# Patient Record
Sex: Female | Born: 1955 | Race: Black or African American | Hispanic: No | Smoking: Never smoker
Health system: Southern US, Community
[De-identification: ages and names within clinical notes are randomized; demographics above are authoritative.]

## PROBLEM LIST (undated history)

## (undated) DIAGNOSIS — C50912 Malignant neoplasm of unspecified site of left female breast: Secondary | ICD-10-CM

## (undated) DIAGNOSIS — Z800 Family history of malignant neoplasm of digestive organs: Secondary | ICD-10-CM

## (undated) DIAGNOSIS — I201 Angina pectoris with documented spasm: Secondary | ICD-10-CM

## (undated) DIAGNOSIS — I208 Other forms of angina pectoris: Secondary | ICD-10-CM

## (undated) DIAGNOSIS — M1990 Unspecified osteoarthritis, unspecified site: Secondary | ICD-10-CM

## (undated) DIAGNOSIS — Z923 Personal history of irradiation: Secondary | ICD-10-CM

## (undated) DIAGNOSIS — I2089 Other forms of angina pectoris: Secondary | ICD-10-CM

## (undated) DIAGNOSIS — Z803 Family history of malignant neoplasm of breast: Secondary | ICD-10-CM

## (undated) DIAGNOSIS — I214 Non-ST elevation (NSTEMI) myocardial infarction: Secondary | ICD-10-CM

## (undated) DIAGNOSIS — I10 Essential (primary) hypertension: Secondary | ICD-10-CM

## (undated) DIAGNOSIS — J189 Pneumonia, unspecified organism: Secondary | ICD-10-CM

## (undated) DIAGNOSIS — Z9221 Personal history of antineoplastic chemotherapy: Secondary | ICD-10-CM

## (undated) HISTORY — DX: Family history of malignant neoplasm of breast: Z80.3

## (undated) HISTORY — PX: BUNIONECTOMY: SHX129

## (undated) HISTORY — PX: CARDIAC CATHETERIZATION: SHX172

## (undated) HISTORY — DX: Family history of malignant neoplasm of digestive organs: Z80.0

---

## 1979-01-03 HISTORY — PX: TUBAL LIGATION: SHX77

## 1989-01-02 HISTORY — PX: ECTOPIC PREGNANCY SURGERY: SHX613

## 1997-07-13 ENCOUNTER — Ambulatory Visit (HOSPITAL_COMMUNITY): Admission: RE | Admit: 1997-07-13 | Discharge: 1997-07-13 | Payer: Self-pay | Admitting: Family Medicine

## 2007-01-03 DIAGNOSIS — J189 Pneumonia, unspecified organism: Secondary | ICD-10-CM

## 2007-01-03 HISTORY — DX: Pneumonia, unspecified organism: J18.9

## 2011-03-31 ENCOUNTER — Ambulatory Visit (INDEPENDENT_AMBULATORY_CARE_PROVIDER_SITE_OTHER): Payer: Federal, State, Local not specified - PPO | Admitting: Family Medicine

## 2011-03-31 VITALS — BP 111/73 | HR 59 | Temp 98.0°F | Resp 18 | Ht 66.5 in | Wt 198.2 lb

## 2011-03-31 DIAGNOSIS — R112 Nausea with vomiting, unspecified: Secondary | ICD-10-CM

## 2011-03-31 DIAGNOSIS — R110 Nausea: Secondary | ICD-10-CM

## 2011-03-31 DIAGNOSIS — J40 Bronchitis, not specified as acute or chronic: Secondary | ICD-10-CM

## 2011-03-31 DIAGNOSIS — J209 Acute bronchitis, unspecified: Secondary | ICD-10-CM

## 2011-03-31 DIAGNOSIS — R1110 Vomiting, unspecified: Secondary | ICD-10-CM

## 2011-03-31 MED ORDER — ONDANSETRON HCL 8 MG PO TABS: 8.0000 mg | ORAL_TABLET | Freq: Three times a day (TID) | ORAL | Status: AC | PRN

## 2011-03-31 NOTE — Progress Notes (Signed)
  Patient Name: Mary Randall Date of Birth: 1955-04-07 Medical Record Number: [id number] Gender: female Date of Encounter: 03/31/2011  History of Present Illness:  Mary Randall is a 56 y.o. very pleasant female patient who presents with the following:  Diagnosed with bronchitis 2 days ago- started on a zpack and tessalon perles.  Went back to work today but she did not feel very well.  She felt nauseated and threw up.  Told that she had to come to the doctor- this is why she is here today.  She also has a cough, had a fever but felt a lot better today from these symptoms.  Stomach feels pretty ok right now. Did have diarrhea yesterday and this am . .. A few loose stools only.    There is no problem list on file for this patient.  No past medical history on file. No past surgical history on file. History  Substance Use Topics  . Smoking status: Never Smoker   . Smokeless tobacco: Never Used  . Alcohol Use: No   No family history on file. Allergies  Allergen Reactions  . Codeine     Dizzy,vomiting    Medication list has been reviewed and updated.  Review of Systems: As per HPI- otherwise negative.   Physical Examination: Filed Vitals:   03/31/11 1550  BP: 111/73  Pulse: 59  Temp: 98 F (36.7 C)  TempSrc: Oral  Resp: 18  Height: 5' 6.5" (1.689 m)  Weight: 198 lb 3.2 oz (89.903 kg)    Body mass index is 31.51 kg/(m^2).  GEN: WDWN, NAD, Non-toxic, A & O x 3 HEENT: Atraumatic, Normocephalic. Neck supple. No masses, No LAD.  TM, oropharynx wnl Ears and Nose: No external deformity. CV: RRR, No M/G/R. No JVD. No thrill. No extra heart sounds. PULM: CTA B, no wheezes, crackles, rhonchi. No retractions. No resp. distress. No accessory muscle use. ABD: S, NT, ND, +BS. No rebound. No HSM. EXTR: No c/c/e NEURO Normal gait.  PSYCH: Normally interactive. Conversant. Not depressed or anxious appearing.  Calm demeanor.    Assessment and Plan: 1. Vomiting  ondansetron (ZOFRAN)  8 MG tablet  2. Nausea    3. Bronchitis     Likely recovering from bronchitis- here today mainly to get a doctor's note for work.  Also gave a few zofran for prn use for nausea as above.  Patient (or parent if minor) instructed to return to clinic or call if not better in 2-3 day(s).

## 2011-12-04 DIAGNOSIS — M1710 Unilateral primary osteoarthritis, unspecified knee: Secondary | ICD-10-CM | POA: Insufficient documentation

## 2012-02-21 ENCOUNTER — Other Ambulatory Visit: Payer: Self-pay | Admitting: Family Medicine

## 2012-02-21 NOTE — Telephone Encounter (Signed)
Please pull paper chart.  

## 2012-02-22 NOTE — Telephone Encounter (Signed)
Chart pulled to PA pool DOS 03/24/11

## 2012-02-25 ENCOUNTER — Other Ambulatory Visit: Payer: Self-pay | Admitting: Family Medicine

## 2012-02-26 NOTE — Telephone Encounter (Signed)
Please pull paper chart.  

## 2012-02-27 NOTE — Telephone Encounter (Signed)
Chart pulled at pa pool DOS: 03/24/11

## 2012-03-05 DIAGNOSIS — I201 Angina pectoris with documented spasm: Secondary | ICD-10-CM | POA: Insufficient documentation

## 2012-09-04 ENCOUNTER — Emergency Department (HOSPITAL_COMMUNITY): Payer: Federal, State, Local not specified - PPO

## 2012-09-04 ENCOUNTER — Observation Stay (HOSPITAL_COMMUNITY)
Admission: EM | Admit: 2012-09-04 | Discharge: 2012-09-05 | Disposition: A | Discharge status: Home / Self Care | Payer: Federal, State, Local not specified - PPO | Attending: Internal Medicine | Admitting: Internal Medicine

## 2012-09-04 ENCOUNTER — Encounter (HOSPITAL_COMMUNITY): Payer: Self-pay | Admitting: Emergency Medicine

## 2012-09-04 DIAGNOSIS — I10 Essential (primary) hypertension: Secondary | ICD-10-CM | POA: Diagnosis present

## 2012-09-04 DIAGNOSIS — Z79899 Other long term (current) drug therapy: Secondary | ICD-10-CM | POA: Insufficient documentation

## 2012-09-04 DIAGNOSIS — I498 Other specified cardiac arrhythmias: Secondary | ICD-10-CM | POA: Insufficient documentation

## 2012-09-04 DIAGNOSIS — Z7982 Long term (current) use of aspirin: Secondary | ICD-10-CM | POA: Insufficient documentation

## 2012-09-04 DIAGNOSIS — R079 Chest pain, unspecified: Principal | ICD-10-CM | POA: Diagnosis present

## 2012-09-04 HISTORY — DX: Other forms of angina pectoris: I20.89

## 2012-09-04 HISTORY — DX: Essential (primary) hypertension: I10

## 2012-09-04 HISTORY — DX: Other forms of angina pectoris: I20.8

## 2012-09-04 LAB — CBC WITH DIFFERENTIAL/PLATELET
Basophils Absolute: 0 10*3/uL (ref 0.0–0.1)
Basophils Absolute: 0 10*3/uL (ref 0.0–0.1)
Basophils Relative: 1 % (ref 0–1)
Eosinophils Relative: 1 % (ref 0–5)
HCT: 39.5 % (ref 36.0–46.0)
Lymphocytes Relative: 47 % — ABNORMAL HIGH (ref 12–46)
Lymphocytes Relative: 41 % (ref 12–46)
Lymphs Abs: 1.9 10*3/uL (ref 0.7–4.0)
MCHC: 33.8 g/dL (ref 30.0–36.0)
MCV: 84.8 fL (ref 78.0–100.0)
Monocytes Absolute: 0.2 10*3/uL (ref 0.1–1.0)
Neutro Abs: 1.8 10*3/uL (ref 1.7–7.7)
Neutro Abs: 2.1 10*3/uL (ref 1.7–7.7)
Platelets: 201 10*3/uL (ref 150–400)
RBC: 4.66 MIL/uL (ref 3.87–5.11)
RDW: 13.2 % (ref 11.5–15.5)
WBC: 4 10*3/uL (ref 4.0–10.5)
WBC: 4.2 10*3/uL (ref 4.0–10.5)

## 2012-09-04 LAB — TSH: TSH: 1.184 u[IU]/mL (ref 0.350–4.500)

## 2012-09-04 LAB — BASIC METABOLIC PANEL
CO2: 28 mEq/L (ref 19–32)
Calcium: 10.7 mg/dL — ABNORMAL HIGH (ref 8.4–10.5)
Chloride: 102 mEq/L (ref 96–112)
Creatinine, Ser: 0.86 mg/dL (ref 0.50–1.10)
Glucose, Bld: 99 mg/dL (ref 70–99)
Sodium: 138 mEq/L (ref 135–145)

## 2012-09-04 LAB — TROPONIN I
Troponin I: <0.3 ng/mL (ref <0.30)
Troponin I: <0.3 ng/mL (ref <0.30)
Troponin I: <0.3 ng/mL (ref <0.30)

## 2012-09-04 LAB — HEMOGLOBIN A1C
Hgb A1c MFr Bld: 5.9 % — ABNORMAL HIGH (ref <5.7)
Mean Plasma Glucose: 123 mg/dL — ABNORMAL HIGH (ref <117)

## 2012-09-04 LAB — CREATININE, SERUM
GFR calc Af Amer: 84 mL/min — ABNORMAL LOW (ref >90)
GFR calc non Af Amer: 72 mL/min — ABNORMAL LOW (ref >90)

## 2012-09-04 MED ORDER — METOPROLOL SUCCINATE ER 50 MG PO TB24
50.0000 mg | ORAL_TABLET | Freq: Two times a day (BID) | ORAL | Status: DC
  Administered 2012-09-04: 50 mg via ORAL
  Filled 2012-09-04 (×3): qty 1

## 2012-09-04 MED ORDER — ASPIRIN EC 325 MG PO TBEC: 325.0000 mg | DELAYED_RELEASE_TABLET | Freq: Every day | ORAL | Status: DC

## 2012-09-04 MED ORDER — ACETAMINOPHEN 325 MG PO TABS
650.0000 mg | ORAL_TABLET | ORAL | Status: DC | PRN
  Filled 2012-09-04: qty 2

## 2012-09-04 MED ORDER — ENOXAPARIN SODIUM 40 MG/0.4ML ~~LOC~~ SOLN
40.0000 mg | SUBCUTANEOUS | Status: DC
  Administered 2012-09-04: 40 mg via SUBCUTANEOUS
  Filled 2012-09-04 (×2): qty 0.4

## 2012-09-04 MED ORDER — ISOSORBIDE MONONITRATE ER 30 MG PO TB24
30.0000 mg | ORAL_TABLET | Freq: Every day | ORAL | Status: DC
  Filled 2012-09-04: qty 1

## 2012-09-04 MED ORDER — NITROGLYCERIN 0.4 MG SL SUBL: 0.4000 mg | SUBLINGUAL_TABLET | SUBLINGUAL | Status: DC | PRN

## 2012-09-04 MED ORDER — NABUMETONE 750 MG PO TABS: 750.0000 mg | ORAL_TABLET | Freq: Two times a day (BID) | ORAL | Status: DC | PRN

## 2012-09-04 MED ORDER — IRBESARTAN 300 MG PO TABS
300.0000 mg | ORAL_TABLET | Freq: Every day | ORAL | Status: DC
  Administered 2012-09-05: 300 mg via ORAL
  Filled 2012-09-04: qty 1

## 2012-09-04 MED ORDER — ONDANSETRON HCL 4 MG/2ML IJ SOLN
4.0000 mg | Freq: Four times a day (QID) | INTRAMUSCULAR | Status: DC | PRN
  Filled 2012-09-04: qty 2

## 2012-09-04 MED ORDER — HYDROCHLOROTHIAZIDE 25 MG PO TABS
12.5000 mg | ORAL_TABLET | Freq: Every day | ORAL | Status: DC
  Administered 2012-09-04: 12.5 mg via ORAL
  Filled 2012-09-04 (×2): qty 0.5

## 2012-09-04 MED ORDER — AMLODIPINE BESYLATE 10 MG PO TABS
10.0000 mg | ORAL_TABLET | Freq: Every day | ORAL | Status: DC
  Administered 2012-09-05: 10 mg via ORAL
  Filled 2012-09-04: qty 1

## 2012-09-04 MED ORDER — HYDRALAZINE HCL 20 MG/ML IJ SOLN: 5.0000 mg | Freq: Once | INTRAMUSCULAR | Status: DC

## 2012-09-04 MED ORDER — HYDRALAZINE HCL 20 MG/ML IJ SOLN
INTRAMUSCULAR | Status: AC
  Filled 2012-09-04: qty 1

## 2012-09-04 MED ORDER — ASPIRIN 81 MG PO CHEW: 324.0000 mg | CHEWABLE_TABLET | ORAL | Status: DC

## 2012-09-04 MED ORDER — HYDRALAZINE HCL 20 MG/ML IJ SOLN: 5.0000 mg | INTRAMUSCULAR | Status: DC | PRN

## 2012-09-04 MED ORDER — ASPIRIN EC 81 MG PO TBEC
81.0000 mg | DELAYED_RELEASE_TABLET | Freq: Every day | ORAL | Status: DC
  Administered 2012-09-05: 81 mg via ORAL
  Filled 2012-09-04: qty 1

## 2012-09-04 MED ORDER — POTASSIUM CHLORIDE 20 MEQ PO PACK
20.0000 meq | PACK | Freq: Every day | ORAL | Status: DC
  Filled 2012-09-04: qty 1

## 2012-09-04 MED ORDER — ASPIRIN 300 MG RE SUPP: 300.0000 mg | RECTAL | Status: DC

## 2012-09-04 NOTE — H&P (Signed)
[facility] History and Physical  Mary Randall HYQ:[id number] DOB: [birth year] DOA: 09/04/2012  Referring physician:  PCP: Pcp Not In System  Specialists:   Chief Complaint: Chest Pain  HPI: Mary Randall is a 57 y.o. BF PMHx HTN, Presented to the ED with PMH of angina and hypertension complaining of substernal chest pain that started at 8am this morning and last till 9:20 am. Negative radiation, negative nausea vomiting, negative diaphoresis rated at 3/10.  She describes it as a squeezing and a pressure. She had a cardiac cath in 2010 done in [place] and was told she has cardiac spasms and some mild blockage not requiring stents or CABG. She has had 324 mg PO aspirin today given at the [facility] who sent her to the ED. She does not have a cardiologist here in [place] but has a family practice at [facility]. EKG shows NO STEMI in triage. She has not taken her BP medication in 4 days for no particular reason and denies being a smoker.   Review of Systems: The patient denies anorexia, fever, weight loss,, vision loss, decreased hearing, hoarseness, syncope, dyspnea on exertion, peripheral edema, balance deficits, hemoptysis, abdominal pain, melena, hematochezia, severe indigestion/heartburn, hematuria, incontinence, genital sores, muscle weakness, suspicious skin lesions, transient blindness, difficulty walking, depression, unusual weight change, abnormal bleeding, enlarged lymph nodes, angioedema, and breast masses.    Past Medical History  Diagnosis Date  . Hypertension   . Angina effort    No past surgical history on file. Social History:  reports that she has never smoked. She has never used smokeless tobacco. She reports that she does not drink alcohol or use illicit drugs.   Allergies  Allergen Reactions  . Codeine     Dizzy,vomiting    No family history on file.   Prior to Admission medications   Medication Sig Start Date End Date Taking? Authorizing  Provider  amLODipine (NORVASC) 10 MG tablet Take 10 mg by mouth daily.   Yes Historical Provider, MD  aspirin EC 325 MG tablet Take 325 mg by mouth daily.   Yes Historical Provider, MD  hydrochlorothiazide (HYDRODIURIL) 12.5 MG tablet Take 12.5 mg by mouth daily.   Yes Historical Provider, MD  irbesartan (AVAPRO) 300 MG tablet Take 300 mg by mouth daily.    Yes Historical Provider, MD  isosorbide mononitrate (IMDUR) 30 MG 24 hr tablet Take 30 mg by mouth daily.   Yes Historical Provider, MD  metoprolol succinate (TOPROL-XL) 50 MG 24 hr tablet Take 50 mg by mouth 2 (two) times daily. Take with or immediately following a meal.   Yes Historical Provider, MD  nabumetone (RELAFEN) 750 MG tablet Take 750 mg by mouth 2 (two) times daily as needed (for arthritis pain).   Yes Historical Provider, MD  potassium chloride (KLOR-CON) 20 MEQ packet Take 20 mEq by mouth daily.    Yes Historical Provider, MD   Physical Exam: Filed Vitals:   09/04/12 1117 09/04/12 1126 09/04/12 1130 09/04/12 1145  BP: 159/107  159/119 155/107  Pulse: 72  66 63  Temp: 98.6 F (37 C)     TempSrc: Oral     Resp: 14  20 22   SpO2:  100% 100% 100%     General: Alert NAD  Eyes:Pupils equal round react to light and accommodation  Neck: Negative JVD  Cardiovascular: Regular with a rate, negative murmurs rubs or gallops, DP/PT pulse 2+ bilateral  Respiratory: Clear to auscultation bilateral  Abdomen: Soft, nontender,  nondistended plus bowel sounds  Neurologic: Pupils equal reactive to light and accommodation cranial nerves II through XII intact, sensation intact throughout  Labs on Admission:  Basic Metabolic Panel:  Recent Labs Lab 09/04/12 1126  NA 138  K 3.8  CL 102  CO2 28  GLUCOSE 99  BUN 13  CREATININE 0.86  CALCIUM 10.7*   Liver Function Tests: No results found for this basename: AST, ALT, ALKPHOS, BILITOT, PROT, ALBUMIN,  in the last 168 hours No results found for this basename: LIPASE, AMYLASE,   in the last 168 hours No results found for this basename: AMMONIA,  in the last 168 hours CBC:  Recent Labs Lab 09/04/12 1126  WBC 4.0  NEUTROABS 1.8  HGB 13.6  HCT 39.5  MCV 84.8  PLT 195   Cardiac Enzymes:  Recent Labs Lab 09/04/12 1126  TROPONINI <0.30    BNP (last 3 results) No results found for this basename: PROBNP,  in the last 8760 hours CBG: No results found for this basename: GLUCAP,  in the last 168 hours  Radiological Exams on Admission: Dg Chest 2 View  09/04/2012   *RADIOLOGY REPORT*  Clinical Data: Chest pain  CHEST - 2 VIEW  Comparison: None.  Findings: Lungs clear.  Heart size and pulmonary vascularity are normal.  No adenopathy. No pneumothorax.  No bone lesions.  IMPRESSION: No abnormality noted.   Original Report Authenticated By: Bretta Bang, M.D.    EKG: Normal sinus rhythm, nonspecific ST-T wave changes in 1 aVL V1- V6 cannot rule out ischemia (no previous EKG for comparison)   Assessment/Plan Active Problems:   * No active hospital problems. *   1. Chest pain; patient asymptomatic currently, troponin x2 negative --Patient placed back on home BP medication, and when necessary hydralazine written with parameters --DC home in the a.m. unless third troponin positive  2. HTN; DC patient home in a.m. on her home medication counseled patient and family on importance of taking medication correctly --Will obtain cholesterol panel --Will obtain hemoglobin A1c   Code Status: Full Family Communication: Family present for discussion of plan of care Disposition Plan:    Time spent: 60 minute  Drema [place] [facility] Pager [phone]  If 7PM-7AM, please contact night-coverage [email or web address] Password [email or web address] 09/04/2012, 1:19 PM

## 2012-09-04 NOTE — ED Notes (Signed)
midsternal cp that started this am has not taken any bp meds x 4 days claims lazziness denies  N/v/sob dx w/ cardiac spasms in past came from fp office was given 324 asa  But no nitro no pain at this time bp was 170/130 refused  IV by em[facility]

## 2012-09-04 NOTE — ED Provider Notes (Signed)
CSN: [id number]     Arrival date & time 09/04/12  1102 History   First MD Initiated Contact with Patient 09/04/12 1107     Chief Complaint  Patient presents with  . Chest Pain   (Consider location/radiation/quality/duration/timing/severity/associated sxs/prior Treatment) HPI   Patient presents to the ED with PMH of angina and hypertension complaining of substernal chest pain that started at 8am this morning and last till 9:20 am. She describes it as a squeezing and a  pressure without radiation. She had a cardiac cath in 2010 done in [place] and was told she has cardiac spasms and some mild blockage not requiring stents or CABG.  She has had 324 mg PO aspirin today given at the Urgent Care who sent her to the ED. She does not have a cardiologist here in  but has a family practice at [facility]. EKG shows NO STEMI in triage. She has not taken her BP medication in 4 days for no particular reason and denies being a smoker.  Chest Pain  Pain location: substernal Pain quality:  Squeezing pressure Radiates to: none Pain severity: currently no pain. Was 6/10 pain Onset quality: immediate Timing: 8am-9:20am Progression: consistent Relieved by:  aspirin Associated symptoms:none.    Past Medical History  Diagnosis Date  . Hypertension   . Angina effort    No past surgical history on file. No family history on file. History  Substance Use Topics  . Smoking status: Never Smoker   . Smokeless tobacco: Never Used  . Alcohol Use: No   OB History   Grav Para Term Preterm Abortions TAB SAB Ect Mult Living                 Review of Systems ROS is negative unless otherwise stated in the HPI  Allergies  Codeine  Home Medications   Current Outpatient Rx  Name  Route  Sig  Dispense  Refill  . amLODipine (NORVASC) 10 MG tablet   Oral   Take 10 mg by mouth daily.         Marland Kitchen aspirin EC 325 MG tablet   Oral   Take 325 mg by mouth daily.         . hydrochlorothiazide  (HYDRODIURIL) 12.5 MG tablet   Oral   Take 12.5 mg by mouth daily.         . irbesartan (AVAPRO) 300 MG tablet   Oral   Take 300 mg by mouth daily.          . isosorbide mononitrate (IMDUR) 30 MG 24 hr tablet   Oral   Take 30 mg by mouth daily.         . metoprolol succinate (TOPROL-XL) 50 MG 24 hr tablet   Oral   Take 50 mg by mouth 2 (two) times daily. Take with or immediately following a meal.         . nabumetone (RELAFEN) 750 MG tablet   Oral   Take 750 mg by mouth 2 (two) times daily as needed (for arthritis pain).         . potassium chloride (KLOR-CON) 20 MEQ packet   Oral   Take 20 mEq by mouth daily.           BP 155/107  Pulse 63  Temp(Src) 98.6 F (37 C) (Oral)  Resp 22  SpO2 100% Physical Exam  Nursing note and vitals reviewed. Constitutional: She appears well-developed and well-nourished. No distress.  HENT:  Head: Normocephalic  and atraumatic.  Eyes: Pupils are equal, round, and reactive to light.  Neck: Normal range of motion. Neck supple.  Cardiovascular: Normal rate and regular rhythm.   Pulmonary/Chest: Effort normal.  Abdominal: Soft.  Neurological: She is alert.  Skin: Skin is warm and dry.    ED Course  Procedures (including critical care time) Labs Review Labs Reviewed  CBC WITH DIFFERENTIAL - Abnormal; Notable for the following:    Lymphocytes Relative 47 (*)    All other components within normal limits  BASIC METABOLIC PANEL - Abnormal; Notable for the following:    Calcium 10.7 (*)    GFR calc non Af Amer 74 (*)    GFR calc Af Amer 86 (*)    All other components within normal limits  TROPONIN I   Imaging Review Dg Chest 2 View  09/04/2012   *RADIOLOGY REPORT*  Clinical Data: Chest pain  CHEST - 2 VIEW  Comparison: None.  Findings: Lungs clear.  Heart size and pulmonary vascularity are normal.  No adenopathy. No pneumothorax.  No bone lesions.  IMPRESSION: No abnormality noted.   Original Report Authenticated By: Bretta Bang, M.D.    MDM   1. Chest pain     Date: 09/04/2012  Rate: 68  Rhythm: sinus rhythm  QRS Axis: normal  Intervals: normal  ST/T Wave abnormalities: normal  Conduction Disutrbances:none  Narrative Interpretation:   Old EKG Reviewed: no old.  Patients cardiac work up in ED is negative. She has had no reoccurrence of pain. Due to previous Cath showing some blockage I will place patient on obs for cardiac r/o.  Obs, tele, team 10, [facility]        Dorthula Matas, PA-C 09/04/12 1333

## 2012-09-05 LAB — LIPID PANEL
HDL: 58 mg/dL (ref >39)
LDL Cholesterol: 102 mg/dL — ABNORMAL HIGH (ref 0–99)
Total CHOL/HDL Ratio: 3.2 RATIO
Triglycerides: 116 mg/dL (ref <150)

## 2012-09-05 LAB — TROPONIN I: Troponin I: <0.3 ng/mL (ref <0.30)

## 2012-09-05 MED ORDER — POTASSIUM CHLORIDE CRYS ER 20 MEQ PO TBCR
20.0000 meq | EXTENDED_RELEASE_TABLET | Freq: Every day | ORAL | Status: DC
  Administered 2012-09-05: 20 meq via ORAL

## 2012-09-05 MED ORDER — HYDROCHLOROTHIAZIDE 12.5 MG PO CAPS
12.5000 mg | ORAL_CAPSULE | Freq: Every day | ORAL | Status: DC
  Administered 2012-09-05: 12.5 mg via ORAL
  Filled 2012-09-05: qty 1

## 2012-09-05 NOTE — ED Provider Notes (Signed)
Medical screening examination/treatment/procedure(s) were performed by non-physician practitioner and as supervising physician I was immediately available for consultation/collaboration.   Nelia Shi, MD 09/05/12 [phone]

## 2012-09-05 NOTE — Discharge Summary (Signed)
Physician Discharge Summary  Mary Randall:[id number] DOB: 11/12/1955 DOA: 09/04/2012  PCP: Pcp Not In System  Admit date: 09/04/2012 Discharge date: 09/05/2012  Time spent: 30 minutes  Recommendations for Outpatient Follow-up:  1. Follow up with PCP in 1-2 weeks 2. Would resume metoprolol when heart rate would tolerate it - see below  Discharge Diagnoses:  Principal Problem:   Chest pain Active Problems:   Essential hypertension, benign   Discharge Condition: Stable  Diet recommendation: Regular  Filed Weights   09/04/12 1434 09/05/12 0539  Weight: 82.555 kg (182 lb) 84.777 kg (186 lb 14.4 oz)    History of present illness:  Mary Randall is a 57 y.o. BF PMHx HTN, Presented to the ED with PMH of angina and hypertension complaining of substernal chest pain that started at 8am this morning and last till 9:20 am. Negative radiation, negative nausea vomiting, negative diaphoresis rated at 3/10. She describes it as a squeezing and a pressure. She had a cardiac cath in 2010 done in [place] and was told she has cardiac spasms and some mild blockage not requiring stents or CABG. She has had 324 mg PO aspirin today given at the [facility] who sent her to the ED. She does not have a cardiologist here in [place] but has a family practice at [facility]. EKG shows NO STEMI in triage. She has not taken her BP medication in 4 days for no particular reason and denies being a smoker.   Hospital Course:  The patient was admitted to the floor. She was noted to become asymptomatically bradycardic with a low HR in the upper 40's. As a result, metoprolol was stopped. She otherwise remained medically stable. Cardiac biomarkers were found to be negative x 3. As such, the patient was recommended to follow up closely with her PCP for hospital follow up.  Discharge Exam: Filed Vitals:   09/04/12 2106 09/04/12 2212 09/05/12 0539 09/05/12 0938  BP: 147/87  166/105 136/87  Pulse: 56 65 49    Temp: 98.5 F (36.9 C)  97.9 F (36.6 C)   TempSrc: Oral  Oral   Resp: 18  18   Height:      Weight:   84.777 kg (186 lb 14.4 oz)   SpO2: 97%       General: Awake, in nad Cardiovascular: regular, s1, s2 Respiratory: normal resp effort, no wheezing  Discharge Instructions     Medication List    STOP taking these medications       metoprolol succinate 50 MG 24 hr tablet  Commonly known as:  TOPROL-XL      TAKE these medications       amLODipine 10 MG tablet  Commonly known as:  NORVASC  Take 10 mg by mouth daily.     aspirin EC 325 MG tablet  Take 325 mg by mouth daily.     hydrochlorothiazide 12.5 MG tablet  Commonly known as:  HYDRODIURIL  Take 12.5 mg by mouth daily.     irbesartan 300 MG tablet  Commonly known as:  AVAPRO  Take 300 mg by mouth daily.     isosorbide mononitrate 30 MG 24 hr tablet  Commonly known as:  IMDUR  Take 30 mg by mouth daily.     nabumetone 750 MG tablet  Commonly known as:  RELAFEN  Take 750 mg by mouth 2 (two) times daily as needed (for arthritis pain).     potassium chloride 20 MEQ packet  Commonly  known as:  KLOR-CON  Take 20 mEq by mouth daily.       Allergies  Allergen Reactions  . Codeine     Dizzy,vomiting   Follow-up Information   Follow up with Follow up with your PCP in 1-2 weeks.       The results of significant diagnostics from this hospitalization (including imaging, microbiology, ancillary and laboratory) are listed below for reference.    Significant Diagnostic Studies: Dg Chest 2 View  09/04/2012   *RADIOLOGY REPORT*  Clinical Data: Chest pain  CHEST - 2 VIEW  Comparison: None.  Findings: Lungs clear.  Heart size and pulmonary vascularity are normal.  No adenopathy. No pneumothorax.  No bone lesions.  IMPRESSION: No abnormality noted.   Original Report Authenticated By: Bretta Bang, M.D.    Microbiology: No results found for this or any previous visit (from the past 240 hour(s)).    Labs: Basic Metabolic Panel:  Recent Labs Lab 09/04/12 1126 09/04/12 1521  NA 138  --   K 3.8  --   CL 102  --   CO2 28  --   GLUCOSE 99  --   BUN 13  --   CREATININE 0.86 0.88  CALCIUM 10.7*  --   MG  --  2.2   Liver Function Tests: No results found for this basename: AST, ALT, ALKPHOS, BILITOT, PROT, ALBUMIN,  in the last 168 hours No results found for this basename: LIPASE, AMYLASE,  in the last 168 hours No results found for this basename: AMMONIA,  in the last 168 hours CBC:  Recent Labs Lab 09/04/12 1126 09/04/12 1521  WBC 4.0 4.2  NEUTROABS 1.8 2.1  HGB 13.6 13.2  HCT 39.5 39.0  MCV 84.8 85.0  PLT 195 201   Cardiac Enzymes:  Recent Labs Lab 09/04/12 1126 09/04/12 1521 09/04/12 1900 09/05/12 0150  TROPONINI <0.30 <0.30 <0.30 <0.30   BNP: BNP (last 3 results) No results found for this basename: PROBNP,  in the last 8760 hours CBG: No results found for this basename: GLUCAP,  in the last 168 hours   Signed:  Jetson Pickrel K  [facility] 09/05/2012, 10:29 AM

## 2012-09-05 NOTE — Progress Notes (Signed)
Utilization review completed.  

## 2014-03-16 ENCOUNTER — Encounter (HOSPITAL_COMMUNITY): Payer: Self-pay | Admitting: Emergency Medicine

## 2014-03-16 ENCOUNTER — Encounter (HOSPITAL_COMMUNITY)
Admission: EM | Disposition: A | Discharge status: Home / Self Care | Payer: Self-pay | Source: Home / Self Care | Attending: Cardiovascular Disease

## 2014-03-16 ENCOUNTER — Observation Stay (HOSPITAL_COMMUNITY)
Admission: EM | Admit: 2014-03-16 | Discharge: 2014-03-17 | Disposition: A | Discharge status: Home / Self Care | Payer: Federal, State, Local not specified - PPO | Attending: Cardiovascular Disease | Admitting: Cardiovascular Disease

## 2014-03-16 ENCOUNTER — Emergency Department (HOSPITAL_COMMUNITY): Payer: Federal, State, Local not specified - PPO

## 2014-03-16 DIAGNOSIS — I201 Angina pectoris with documented spasm: Secondary | ICD-10-CM | POA: Insufficient documentation

## 2014-03-16 DIAGNOSIS — I10 Essential (primary) hypertension: Secondary | ICD-10-CM | POA: Diagnosis not present

## 2014-03-16 DIAGNOSIS — Z7982 Long term (current) use of aspirin: Secondary | ICD-10-CM | POA: Diagnosis not present

## 2014-03-16 DIAGNOSIS — I200 Unstable angina: Secondary | ICD-10-CM

## 2014-03-16 DIAGNOSIS — R9431 Abnormal electrocardiogram [ECG] [EKG]: Secondary | ICD-10-CM

## 2014-03-16 DIAGNOSIS — Z79899 Other long term (current) drug therapy: Secondary | ICD-10-CM | POA: Insufficient documentation

## 2014-03-16 DIAGNOSIS — Z886 Allergy status to analgesic agent status: Secondary | ICD-10-CM | POA: Insufficient documentation

## 2014-03-16 DIAGNOSIS — R079 Chest pain, unspecified: Secondary | ICD-10-CM | POA: Diagnosis present

## 2014-03-16 DIAGNOSIS — I25110 Atherosclerotic heart disease of native coronary artery with unstable angina pectoris: Secondary | ICD-10-CM

## 2014-03-16 DIAGNOSIS — Z9851 Tubal ligation status: Secondary | ICD-10-CM | POA: Diagnosis not present

## 2014-03-16 DIAGNOSIS — I214 Non-ST elevation (NSTEMI) myocardial infarction: Secondary | ICD-10-CM

## 2014-03-16 DIAGNOSIS — I2510 Atherosclerotic heart disease of native coronary artery without angina pectoris: Principal | ICD-10-CM | POA: Insufficient documentation

## 2014-03-16 HISTORY — DX: Angina pectoris with documented spasm: I20.1

## 2014-03-16 HISTORY — PX: LEFT HEART CATHETERIZATION WITH CORONARY ANGIOGRAM: SHX5451

## 2014-03-16 HISTORY — DX: Non-ST elevation (NSTEMI) myocardial infarction: I21.4

## 2014-03-16 HISTORY — DX: Unspecified osteoarthritis, unspecified site: M19.90

## 2014-03-16 HISTORY — DX: Pneumonia, unspecified organism: J18.9

## 2014-03-16 LAB — CBC
HEMATOCRIT: 37.9 % (ref 36.0–46.0)
Hemoglobin: 12.7 g/dL (ref 12.0–15.0)
MCH: 28.7 pg (ref 26.0–34.0)
MCHC: 33.5 g/dL (ref 30.0–36.0)
MCV: 85.6 fL (ref 78.0–100.0)
Platelets: 194 10*3/uL (ref 150–400)
RBC: 4.43 MIL/uL (ref 3.87–5.11)
RDW: 13.2 % (ref 11.5–15.5)
WBC: 5.8 10*3/uL (ref 4.0–10.5)

## 2014-03-16 LAB — COMPREHENSIVE METABOLIC PANEL
ALBUMIN: 3.9 g/dL (ref 3.5–5.2)
ALK PHOS: 89 U/L (ref 39–117)
ALT: 16 U/L (ref 0–35)
ANION GAP: 9 (ref 5–15)
AST: 19 U/L (ref 0–37)
BUN: 13 mg/dL (ref 6–23)
CALCIUM: 10.6 mg/dL — AB (ref 8.4–10.5)
CO2: 24 mmol/L (ref 19–32)
Chloride: 107 mmol/L (ref 96–112)
Creatinine, Ser: 1.06 mg/dL (ref 0.50–1.10)
GFR calc Af Amer: 66 mL/min — ABNORMAL LOW (ref >90)
GFR calc non Af Amer: 57 mL/min — ABNORMAL LOW (ref >90)
Glucose, Bld: 159 mg/dL — ABNORMAL HIGH (ref 70–99)
POTASSIUM: 3.4 mmol/L — AB (ref 3.5–5.1)
Sodium: 140 mmol/L (ref 135–145)
TOTAL PROTEIN: 7.1 g/dL (ref 6.0–8.3)
Total Bilirubin: 0.4 mg/dL (ref 0.3–1.2)

## 2014-03-16 LAB — CBC WITH DIFFERENTIAL/PLATELET
BASOS PCT: 0 % (ref 0–1)
Basophils Absolute: 0 10*3/uL (ref 0.0–0.1)
Eosinophils Absolute: 0.1 10*3/uL (ref 0.0–0.7)
Eosinophils Relative: 1 % (ref 0–5)
HEMATOCRIT: 40 % (ref 36.0–46.0)
HEMOGLOBIN: 13.3 g/dL (ref 12.0–15.0)
LYMPHS ABS: 1.9 10*3/uL (ref 0.7–4.0)
Lymphocytes Relative: 45 % (ref 12–46)
MCH: 28.7 pg (ref 26.0–34.0)
MCHC: 33.3 g/dL (ref 30.0–36.0)
MCV: 86.2 fL (ref 78.0–100.0)
Monocytes Absolute: 0.2 10*3/uL (ref 0.1–1.0)
Monocytes Relative: 5 % (ref 3–12)
NEUTROS PCT: 49 % (ref 43–77)
Neutro Abs: 2.1 10*3/uL (ref 1.7–7.7)
PLATELETS: 216 10*3/uL (ref 150–400)
RBC: 4.64 MIL/uL (ref 3.87–5.11)
RDW: 13.2 % (ref 11.5–15.5)
WBC: 4.3 10*3/uL (ref 4.0–10.5)

## 2014-03-16 LAB — TROPONIN I
Troponin I: 0.12 ng/mL — ABNORMAL HIGH (ref <0.031)
Troponin I: 0.18 ng/mL — ABNORMAL HIGH (ref <0.031)

## 2014-03-16 LAB — CREATININE, SERUM
Creatinine, Ser: 0.91 mg/dL (ref 0.50–1.10)
GFR, EST AFRICAN AMERICAN: 79 mL/min — AB (ref >90)
GFR, EST NON AFRICAN AMERICAN: 68 mL/min — AB (ref >90)

## 2014-03-16 LAB — LIPASE, BLOOD: Lipase: 18 U/L (ref 11–59)

## 2014-03-16 LAB — BRAIN NATRIURETIC PEPTIDE: B Natriuretic Peptide: 61.4 pg/mL (ref 0.0–100.0)

## 2014-03-16 SURGERY — LEFT HEART CATHETERIZATION WITH CORONARY ANGIOGRAM

## 2014-03-16 MED ORDER — IRBESARTAN 300 MG PO TABS
300.0000 mg | ORAL_TABLET | Freq: Every day | ORAL | Status: DC
  Administered 2014-03-16: 300 mg via ORAL
  Filled 2014-03-16 (×2): qty 1

## 2014-03-16 MED ORDER — SODIUM CHLORIDE 0.9 % IJ SOLN: 3.0000 mL | INTRAMUSCULAR | Status: DC | PRN

## 2014-03-16 MED ORDER — ASPIRIN EC 325 MG PO TBEC
325.0000 mg | DELAYED_RELEASE_TABLET | Freq: Every day | ORAL | Status: DC
  Administered 2014-03-17: 08:00:00 325 mg via ORAL
  Filled 2014-03-16: qty 1

## 2014-03-16 MED ORDER — LIDOCAINE HCL (PF) 1 % IJ SOLN
INTRAMUSCULAR | Status: AC
  Filled 2014-03-16: qty 30

## 2014-03-16 MED ORDER — HEPARIN SODIUM (PORCINE) 5000 UNIT/ML IJ SOLN
4000.0000 [IU] | Freq: Once | INTRAMUSCULAR | Status: AC
  Administered 2014-03-16: 4000 [IU] via INTRAVENOUS
  Filled 2014-03-16: qty 1

## 2014-03-16 MED ORDER — NITROGLYCERIN 0.4 MG SL SUBL
0.4000 mg | SUBLINGUAL_TABLET | SUBLINGUAL | Status: DC | PRN
  Administered 2014-03-16 (×2): 0.4 mg via SUBLINGUAL

## 2014-03-16 MED ORDER — HEPARIN BOLUS VIA INFUSION: 4000.0000 [IU] | Freq: Once | INTRAVENOUS | Status: DC

## 2014-03-16 MED ORDER — HEPARIN (PORCINE) IN NACL 2-0.9 UNIT/ML-% IJ SOLN
INTRAMUSCULAR | Status: AC
  Filled 2014-03-16: qty 1000

## 2014-03-16 MED ORDER — NITROGLYCERIN 0.4 MG SL SUBL: 0.4000 mg | SUBLINGUAL_TABLET | SUBLINGUAL | Status: DC | PRN

## 2014-03-16 MED ORDER — MIDAZOLAM HCL 2 MG/2ML IJ SOLN
INTRAMUSCULAR | Status: AC
  Filled 2014-03-16: qty 2

## 2014-03-16 MED ORDER — ASPIRIN 81 MG PO CHEW
324.0000 mg | CHEWABLE_TABLET | Freq: Once | ORAL | Status: AC
  Administered 2014-03-16: 324 mg via ORAL
  Filled 2014-03-16: qty 4

## 2014-03-16 MED ORDER — SODIUM CHLORIDE 0.9 % IV BOLUS (SEPSIS)
1000.0000 mL | Freq: Once | INTRAVENOUS | Status: AC
  Administered 2014-03-16: 1000 mL via INTRAVENOUS

## 2014-03-16 MED ORDER — HEPARIN SODIUM (PORCINE) 1000 UNIT/ML IJ SOLN
INTRAMUSCULAR | Status: AC
  Filled 2014-03-16: qty 1

## 2014-03-16 MED ORDER — MORPHINE SULFATE 4 MG/ML IJ SOLN
4.0000 mg | Freq: Once | INTRAMUSCULAR | Status: AC
  Administered 2014-03-16: 4 mg via INTRAVENOUS
  Filled 2014-03-16: qty 1

## 2014-03-16 MED ORDER — PROMETHAZINE HCL 25 MG/ML IJ SOLN
12.5000 mg | Freq: Four times a day (QID) | INTRAMUSCULAR | Status: DC | PRN
  Administered 2014-03-16: 14:00:00 12.5 mg via INTRAVENOUS
  Filled 2014-03-16: qty 1

## 2014-03-16 MED ORDER — FENTANYL CITRATE 0.05 MG/ML IJ SOLN
INTRAMUSCULAR | Status: AC
  Filled 2014-03-16: qty 2

## 2014-03-16 MED ORDER — ATORVASTATIN CALCIUM 40 MG PO TABS
40.0000 mg | ORAL_TABLET | Freq: Every day | ORAL | Status: DC
  Administered 2014-03-16: 19:00:00 40 mg via ORAL
  Filled 2014-03-16 (×2): qty 1

## 2014-03-16 MED ORDER — SODIUM CHLORIDE 0.9 % IV SOLN: 250.0000 mL | INTRAVENOUS | Status: DC | PRN

## 2014-03-16 MED ORDER — HEPARIN SODIUM (PORCINE) 5000 UNIT/ML IJ SOLN
5000.0000 [IU] | Freq: Three times a day (TID) | INTRAMUSCULAR | Status: DC
Start: 2014-03-16 — End: 2014-03-17
  Administered 2014-03-16 – 2014-03-17 (×2): 5000 [IU] via SUBCUTANEOUS
  Filled 2014-03-16: qty 1

## 2014-03-16 MED ORDER — HYDROCHLOROTHIAZIDE 25 MG PO TABS
25.0000 mg | ORAL_TABLET | Freq: Every day | ORAL | Status: DC
Start: 2014-03-17 — End: 2014-03-17
  Filled 2014-03-16: qty 1

## 2014-03-16 MED ORDER — ONDANSETRON HCL 4 MG/2ML IJ SOLN
4.0000 mg | Freq: Once | INTRAMUSCULAR | Status: AC
  Administered 2014-03-16: 4 mg via INTRAVENOUS
  Filled 2014-03-16: qty 2

## 2014-03-16 MED ORDER — ACETAMINOPHEN 325 MG PO TABS: 650.0000 mg | ORAL_TABLET | ORAL | Status: DC | PRN

## 2014-03-16 MED ORDER — SODIUM CHLORIDE 0.9 % IV SOLN: 1.0000 mL/kg/h | INTRAVENOUS | Status: AC

## 2014-03-16 MED ORDER — ISOSORBIDE MONONITRATE ER 30 MG PO TB24
30.0000 mg | ORAL_TABLET | Freq: Every day | ORAL | Status: DC
  Administered 2014-03-16: 30 mg via ORAL
  Filled 2014-03-16 (×3): qty 1

## 2014-03-16 MED ORDER — ONDANSETRON HCL 4 MG/2ML IJ SOLN
4.0000 mg | Freq: Once | INTRAMUSCULAR | Status: AC
  Administered 2014-03-16: 4 mg via INTRAVENOUS

## 2014-03-16 MED ORDER — ONDANSETRON HCL 4 MG/2ML IJ SOLN
INTRAMUSCULAR | Status: AC
  Filled 2014-03-16: qty 2

## 2014-03-16 MED ORDER — AMLODIPINE BESYLATE 10 MG PO TABS
10.0000 mg | ORAL_TABLET | Freq: Every day | ORAL | Status: DC
  Administered 2014-03-16: 16:00:00 10 mg via ORAL
  Filled 2014-03-16 (×2): qty 1

## 2014-03-16 MED ORDER — ONDANSETRON HCL 4 MG/2ML IJ SOLN: 4.0000 mg | Freq: Four times a day (QID) | INTRAMUSCULAR | Status: DC | PRN

## 2014-03-16 MED ORDER — SODIUM CHLORIDE 0.9 % IJ SOLN: 3.0000 mL | Freq: Two times a day (BID) | INTRAMUSCULAR | Status: DC

## 2014-03-16 NOTE — ED Provider Notes (Signed)
CSN: [id number]     Arrival date & time 03/16/14  2229 History   First MD Initiated Contact with Patient 03/16/14 0630     Chief Complaint  Patient presents with  . Chest Pain  . Nausea     (Consider location/radiation/quality/duration/timing/severity/associated sxs/prior Treatment) The history is provided by the patient. No language interpreter was used.  Mary Randall is a 59 y/o F with PMHx of HTN, angina with last cardiac catheterization performed in 2010 with findings of blockage as per patient's report, presenting to the ED with chest pain and nausea. Patient reported that her nausea started approximately 8:00 PM last evening. Stated that at approximately 3:00 AM this morning, while the patient was in his sleep, reported that she started to experience chest pain localized to the center and left side of her chest described as a dull aching pain that is intermittent lasting only a couple of seconds. Patient reported that she had at least 3 episodes of emesis today-mainly consisting of food contents-denied blood or mucus. Patient reported that she took aspirin, full dose of 325 mg yesterday evening at approximately 7:30 PM - stated that she takes his medication daily. Patient reported that she ran out of her isosorbide, reported that her last dose was this past Saturday. Patient reported that she's been feeling fine otherwise. Denied shortness of breath, difficulty breathing, abdominal pain, dizziness, lightheaded, fever, fainting, leg swelling, numbness, tingling, weakness, travels. PCP none  Past Medical History  Diagnosis Date  . Hypertension   . Angina effort    Past Surgical History  Procedure Laterality Date  . Cardiac catheterization  2010  . Tubal ligation  1981, 1991  . Other surgical history  1991    Tubal pregnancy   Family History  Problem Relation Age of Onset  . Pancreatic cancer Brother    History  Substance Use Topics  . Smoking status: Never Smoker   . Smokeless  tobacco: Never Used  . Alcohol Use: No   OB History    No data available     Review of Systems  Constitutional: Positive for chills. Negative for fever.  Eyes: Negative for visual disturbance.  Respiratory: Negative for chest tightness and shortness of breath.   Cardiovascular: Positive for chest pain.  Gastrointestinal: Positive for nausea and vomiting. Negative for abdominal pain, diarrhea, constipation, blood in stool and anal bleeding.  Musculoskeletal: Negative for back pain and neck pain.  Neurological: Negative for dizziness, syncope, weakness and light-headedness.      Allergies  Codeine  Home Medications   Prior to Admission medications   Medication Sig Start Date End Date Taking? Authorizing Provider  amLODipine (NORVASC) 10 MG tablet Take 10 mg by mouth daily.   Yes Historical Provider, MD  aspirin EC 325 MG tablet Take 325 mg by mouth daily.   Yes Historical Provider, MD  hydrochlorothiazide (HYDRODIURIL) 25 MG tablet Take 25 mg by mouth daily.   Yes Historical Provider, MD  irbesartan (AVAPRO) 300 MG tablet Take 300 mg by mouth daily.    Yes Historical Provider, MD  isosorbide mononitrate (IMDUR) 30 MG 24 hr tablet Take 30 mg by mouth daily.   Yes Historical Provider, MD   BP 171/99 mmHg  Pulse 63  Temp(Src) 98.3 F (36.8 C) (Oral)  Resp 22  Ht 5\' 7"  (1.702 m)  Wt 185 lb (83.915 kg)  BMI 28.97 kg/m2  SpO2 100% Physical Exam  Constitutional: She is oriented to person, place, and time. She appears well-developed and  well-nourished. No distress.  HENT:  Head: Normocephalic and atraumatic.  Mouth/Throat: Oropharynx is clear and moist. No oropharyngeal exudate.  Eyes: Conjunctivae and EOM are normal. Pupils are equal, round, and reactive to light. Right eye exhibits no discharge. Left eye exhibits no discharge.  Neck: Normal range of motion. Neck supple.  Cardiovascular: Normal rate, regular rhythm and normal heart sounds.   Pulses:      Radial pulses are 2+  on the right side, and 2+ on the left side.       Dorsalis pedis pulses are 2+ on the right side, and 2+ on the left side.  Cap refill less than 3 seconds Negative swelling or pitting edema identified to lower extremities bilaterally  Pulmonary/Chest: Effort normal and breath sounds normal. No respiratory distress. She has no wheezes. She has no rales. She exhibits no tenderness.  Abdominal: Soft. Bowel sounds are normal. She exhibits no distension. There is no tenderness. There is no rebound and no guarding.  Musculoskeletal: Normal range of motion.  Neurological: She is alert and oriented to person, place, and time. No cranial nerve deficit. She exhibits normal muscle tone. Coordination normal.  Skin: Skin is warm and dry. No rash noted. She is not diaphoretic. No erythema.  Psychiatric: She has a normal mood and affect. Her behavior is normal. Thought content normal.  Nursing note and vitals reviewed.   ED Course  Procedures (including critical care time)  Results for orders placed or performed during the hospital encounter of 03/16/14  CBC with Differential/Platelet  Result Value Ref Range   WBC 4.3 4.0 - 10.5 K/uL   RBC 4.64 3.87 - 5.11 MIL/uL   Hemoglobin 13.3 12.0 - 15.0 g/dL   HCT 40.0 36.0 - 46.0 %   MCV 86.2 78.0 - 100.0 fL   MCH 28.7 26.0 - 34.0 pg   MCHC 33.3 30.0 - 36.0 g/dL   RDW 13.2 11.5 - 15.5 %   Platelets 216 150 - 400 K/uL   Neutrophils Relative % 49 43 - 77 %   Neutro Abs 2.1 1.7 - 7.7 K/uL   Lymphocytes Relative 45 12 - 46 %   Lymphs Abs 1.9 0.7 - 4.0 K/uL   Monocytes Relative 5 3 - 12 %   Monocytes Absolute 0.2 0.1 - 1.0 K/uL   Eosinophils Relative 1 0 - 5 %   Eosinophils Absolute 0.1 0.0 - 0.7 K/uL   Basophils Relative 0 0 - 1 %   Basophils Absolute 0.0 0.0 - 0.1 K/uL  Comprehensive metabolic panel  Result Value Ref Range   Sodium 140 135 - 145 mmol/L   Potassium 3.4 (L) 3.5 - 5.1 mmol/L   Chloride 107 96 - 112 mmol/L   CO2 24 19 - 32 mmol/L    Glucose, Bld 159 (H) 70 - 99 mg/dL   BUN 13 6 - 23 mg/dL   Creatinine, Ser 1.06 0.50 - 1.10 mg/dL   Calcium 10.6 (H) 8.4 - 10.5 mg/dL   Total Protein 7.1 6.0 - 8.3 g/dL   Albumin 3.9 3.5 - 5.2 g/dL   AST 19 0 - 37 U/L   ALT 16 0 - 35 U/L   Alkaline Phosphatase 89 39 - 117 U/L   Total Bilirubin 0.4 0.3 - 1.2 mg/dL   GFR calc non Af Amer 57 (L) >90 mL/min   GFR calc Af Amer 66 (L) >90 mL/min   Anion gap 9 5 - 15  Lipase, blood  Result Value Ref Range  Lipase 18 11 - 59 U/L  Troponin I  Result Value Ref Range   Troponin I <0.03 <0.031 ng/mL  Brain natriuretic peptide  Result Value Ref Range   B Natriuretic Peptide 61.4 0.0 - 100.0 pg/mL    Labs Review Labs Reviewed  COMPREHENSIVE METABOLIC PANEL - Abnormal; Notable for the following:    Potassium 3.4 (*)    Glucose, Bld 159 (*)    Calcium 10.6 (*)    GFR calc non Af Amer 57 (*)    GFR calc Af Amer 66 (*)    All other components within normal limits  CBC WITH DIFFERENTIAL/PLATELET  LIPASE, BLOOD  TROPONIN I  BRAIN NATRIURETIC PEPTIDE    Imaging Review Dg Chest Port 1 View  03/16/2014   CLINICAL DATA:  Chest pain, nausea beginning this morning.  EXAM: PORTABLE CHEST - 1 VIEW  COMPARISON:  Chest radiograph September 04, 2012  FINDINGS: Cardiomediastinal silhouette is unremarkable for this low inspiratory portable examination with crowded vasculature markings. The lungs are clear without pleural effusions or focal consolidations. Trachea projects midline and there is no pneumothorax. Included soft tissue planes and osseous structures are non-suspicious.  IMPRESSION: Normal chest.   Electronically Signed   By: Elon Alas   On: 03/16/2014 06:43     EKG Interpretation   Date/Time:  Monday March 16 2014 08:48:10 EDT Ventricular Rate:  82 PR Interval:  185 QRS Duration: 95 QT Interval:  375 QTC Calculation: 438 R Axis:   108 Text Interpretation:  Sinus rhythm T waves normalized in septal leads T  wave inversion and  ST depression in I, avL worsening Confirmed by WARD,   DO, KRISTEN ([id number]) on 03/16/2014 9:10:12 AM       8:40 AM This provider spoke with Suanne Marker, PA-C. Discussed case in great detail, labs, imaging, vitals, ED course. Cardiology to come and assess patient.   8:43 AM This provider was made aware that patient was not feeling well and experiencing chest pain. This provider immediately at bedside - patient reported that the pain is in her center. Patient appears anxious. MD at bedside. Patient starting to have an episode of emesis. Repeat EKG performed that identified changes - T waves have been flipped in leads.   9:04 AM Dr. Leonides Schanz on the phone consulting with STEMI physician, Dr. Burt Knack to discuss EKG changes.   9:11 AM Dr. Burt Knack at bedside. Heparin ordered.    CRITICAL CARE Performed by: Jamse Mead   Total critical care time: 35  Critical care time was exclusive of separately billable procedures and treating other patients.  Critical care was necessary to treat or prevent imminent or life-threatening deterioration.  Critical care was time spent personally by me on the following activities: development of treatment plan with patient and/or surrogate as well as nursing, discussions with consultants, evaluation of patient's response to treatment, examination of patient, obtaining history from patient or surrogate, ordering and performing treatments and interventions, ordering and review of laboratory studies, ordering and review of radiographic studies, pulse oximetry and re-evaluation of patient's condition.  MDM   Final diagnoses:  Chest pain, unspecified chest pain type    Medications  nitroGLYCERIN (NITROSTAT) SL tablet 0.4 mg (0.4 mg Sublingual Given 03/16/14 0909)  heparin injection 4,000 Units (not administered)  aspirin chewable tablet 324 mg (324 mg Oral Given 03/16/14 0726)  sodium chloride 0.9 % bolus 1,000 mL (0 mLs Intravenous Stopped 03/16/14 0838)  ondansetron  (ZOFRAN) injection 4 mg (4 mg Intravenous Given 03/16/14 0846)  morphine 4 MG/ML injection 4 mg (4 mg Intravenous Given 03/16/14 0846)    Filed Vitals:   03/16/14 2952 03/16/14 0645 03/16/14 0713 03/16/14 0835  BP: 162/84 140/88 139/93 171/99  Pulse: 67 64 63 63  Temp: 98.3 F (36.8 C)     TempSrc: Oral     Resp: 16 14 18 22   Height: 5\' 7"  (1.702 m)     Weight: 185 lb (83.915 kg)     SpO2: 100% 97% 99% 100%   EKG noted sinus rhythm with a heart rate of 64 bpm-repolarization identified-no change since last tracing. Troponin negative elevation. CBC negative elevated leukocytosis. Hemoglobin 13.3, hematocrit 40.0. CMP noted mild hypokalemia 3.4. Glucose of 159 with negative elevated anion gap. Lipase negative elevation. Chest x-ray unremarkable. Patient presented to the ED with chest pain and nausea this started yesterday evening. Patient stable at this time and denies chest pain. Patient given aspirin in ED setting and started on IV fluids. Patient has history of angina, has cardiac catheterization was performed in 2010 without follow-up of cardiologist or repeat cauterization. As per patient's report, reported that on the cardiac catheterization there was some blockage. Patient to be admitted with cardiology consult.  While in the ED setting, patient started to experience chest pain. Repeat EKG was performed that identified changes. Cardiology consulted-patient to be taken to Cath Lab. Patient started on IV heparin in the ED setting-bolus given.   Jamse Mead, PA-C 03/16/14 [place], PA-C 03/16/14 [id number]

## 2014-03-16 NOTE — ED Notes (Signed)
EDP at bedside  

## 2014-03-16 NOTE — ED Notes (Signed)
Cardiology at bedside.

## 2014-03-16 NOTE — Progress Notes (Signed)
TR BAND REMOVAL  LOCATION:  right radial  DEFLATED PER PROTOCOL:  Yes.    TIME BAND OFF / DRESSING APPLIED:   1300   SITE UPON ARRIVAL:   Level 0  SITE AFTER BAND REMOVAL:  Level 0  REVERSE ALLEN'S TEST:    positive  CIRCULATION SENSATION AND MOVEMENT:  Within Normal Limits  Yes.    COMMENTS:

## 2014-03-16 NOTE — ED Notes (Addendum)
Pt reports 7/10 midsternal "burning" sensation. Respirations unlabored. ED PA notified.

## 2014-03-16 NOTE — ED Provider Notes (Signed)
Medical screening examination/treatment/procedure(s) were conducted as a shared visit with non-physician practitioner(s) and myself.  I personally evaluated the patient during the encounter.   EKG Interpretation   Date/Time:  Monday March 16 2014 06:30:43 EDT Ventricular Rate:  64 PR Interval:  182 QRS Duration: 97 QT Interval:  383 QTC Calculation: 395 R Axis:   87 Text Interpretation:  Sinus rhythm Repol abnrm suggests ischemia,  anterolateral No significant change since last tracing Confirmed by ALLEN   MD, ANTHONY ([id number]) on 03/16/2014 6:41:40 AM        EKG Interpretation  Date/Time:  Monday March 16 2014 08:48:10 EDT Ventricular Rate:  82 PR Interval:  185 QRS Duration: 95 QT Interval:  375 QTC Calculation: 438 R Axis:   108 Text Interpretation:  Sinus rhythm T waves normalized in septal leads T wave inversion and ST depression in I, avL worsening Confirmed by Tuere Nwosu,  DO, Slaton Reaser ([id number]) on 03/16/2014 9:10:12 AM        Pt is a 60 y.o. female with history of hypertension, reported history of prior coronary artery disease on last cardiac catheterization in 2010 in [place] who presents emergency department with complaints of chest pain, nausea and vomiting.  Patient complains of burning in her chest that started prior to vomiting. Has had some shortness of breath with exertion which is new. No diaphoresis or dizziness. No abdominal pain.  Patient with a symptomatic upon arrival. First troponin negative. Began having chest pain in the emergency department and active vomiting, diaphoresis. Repeat EKG showed T-wave changes in inferior and septal leads. Discussed with Dr. Burt Knack who states patient does not meet STEMI criteria but he is concerned and will take patient urgently to the Cath Lab. He is requested that we give her an IV heparin bolus. She has received aspirin. She has received morphine and nitroglycerin for pain.  [place], DO 03/16/14 1626

## 2014-03-16 NOTE — ED Notes (Signed)
Pt is chest pain free after x2 sublingual nitroglycerin.

## 2014-03-16 NOTE — ED Notes (Signed)
Pt transported to cardiac cath lab on pads and zoll.

## 2014-03-16 NOTE — CV Procedure (Signed)
    Cardiac Catheterization Procedure Note  Name: Mary Randall MRN: [id number] DOB: 02/09/1955  Procedure: Left Heart Cath, Selective Coronary Angiography, LV angiography  Indication: Unstable angina. This 59 year old woman presented to the emergency department this morning with substernal chest burning. Initially her symptoms were mild and there were nonspecific EKG changes. However, she developed more severe chest discomfort with associated nausea and vomiting. There were marked EKG changes and she was brought up emergently for cardiac catheterization. She has a history of underlying hypertension.   Procedural Details: The right wrist was prepped, draped, and anesthetized with 1% lidocaine. Using the modified Seldinger technique, a 5/6 French Slender sheath was introduced into the right radial artery. 3 mg of verapamil was administered through the sheath, weight-based unfractionated heparin was administered intravenously. Standard Judkins catheters were used for selective coronary angiography and left ventriculography. Catheter exchanges were performed over an exchange length guidewire. There were no immediate procedural complications. A TR band was used for radial hemostasis at the completion of the procedure.  The patient was transferred to the post catheterization recovery area for further monitoring.  Procedural Findings: Hemodynamics: AO 113/70 with a mean of 91 LV 117/12  Coronary angiography: Coronary dominance: right  Left mainstem: The left main stem is widely patent. The vessel has minor irregularity and divides into the LAD and left circumflex. There is no stenosis.  Left anterior descending (LAD): The LAD has very minimal stenosis at the ostium. The diagonal branches are patent. The first diagonal has minor irregularity. The second diagonal has 50% ostial stenosis. The proximal LAD has no significant disease. The mid LAD after the second diagonal has diffuse 40-50% stenosis. The  LAD wraps around the inferoapex.  Left circumflex (LCx): The left circumflex is large in caliber. The vessel has no significant obstruction through the proximal and mid segments. There is minor 20% stenosis in the mid vessel. The obtuse marginal branches are patent with mild nonobstructive disease noted.  Right coronary artery (RCA): This is a large, dominant vessel. The PDA and PLA branches are patent. There is no stenosis throughout the RCA distribution.  Left ventriculography: Left ventricular systolic function is normal, LVEF is estimated at 60-65%, there is no significant mitral regurgitation   Contrast: 85 cc Omnipaque  Estimated Blood Loss: minimal  Final Conclusions:   1. 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 Normal LV systolic function with normal LVEDP  Recommendations: Somewhat unclear as to the etiology of this patient's fairly profound symptoms and EKG changes. 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 The patient had previously been on long-acting nitrates and ran out of medication recently. It is also possible that she has some sort of GI pathology but difficult to explain her EKG changes. We'll start back on long-acting nitrate therapy. The patient should have serial cardiac enzymes and overnight observation in the hospital.  Sherren Mocha MD, [facility] 03/16/2014, 10:15 AM

## 2014-03-16 NOTE — ED Notes (Signed)
Pt arrives with c/o chest pain ongoing since last night, states it started around 2000, and got worse this morning around 0200. Emesis. Denies SHOB, diaphoresis.

## 2014-03-16 NOTE — H&P (Signed)
History and Physical  Patient ID: Mary Randall MRN: [id number], SOB: 1955/11/21 59 y.o. Date of Encounter: 03/16/2014, 9:14 AM  Primary Physician: Pcp Not In System Primary Cardiologist: new  Chief Complaint: Chest pain  HPI: 59 y.o. female w/ PMHx significant for hypertension and nonobstructive CAD who presented to [facility] on 03/16/2014 with complaints of Chest pain. Initially she had mild pain and nonspecific ST change on EKG. Then around 0845 this am she developed severe burning in her chest and associated nausea and vomiting. EKG showed dynamic changes with pseudonormalization of T wave changes inferiorly and marked ST depression posterolaterally. I was called to the ER to evaluate her urgently.   On my evaluation, she is feeling better. Chest pain at 3/10 but continuing to lessen. No dyspnea, edema, or heart palpitations. No hx of similar sx's. In 2010 she had cath for chest pain but sx's different than today.   Notes chest burning started last night and has been intermittent since then. There has been some shortness of breath associated but not at present. Also had diaphoresis this morning. No lightheadedness or syncope.   Past Medical History  Diagnosis Date  . Hypertension   . Angina effort      Surgical History:  Past Surgical History  Procedure Laterality Date  . Cardiac catheterization  2010  . Tubal ligation  1981, 1991  . Other surgical history  1991    Tubal pregnancy     Home Meds: Prior to Admission medications   Medication Sig Start Date End Date Taking? Authorizing Provider  amLODipine (NORVASC) 10 MG tablet Take 10 mg by mouth daily.   Yes Historical Provider, MD  aspirin EC 325 MG tablet Take 325 mg by mouth daily.   Yes Historical Provider, MD  hydrochlorothiazide (HYDRODIURIL) 25 MG tablet Take 25 mg by mouth daily.   Yes Historical Provider, MD  irbesartan (AVAPRO) 300 MG tablet Take 300 mg by mouth daily.    Yes Historical Provider, MD    isosorbide mononitrate (IMDUR) 30 MG 24 hr tablet Take 30 mg by mouth daily.   Yes Historical Provider, MD    Allergies:  Allergies  Allergen Reactions  . Codeine     Dizzy,vomiting    History   Social History  . Marital Status: Single    Spouse Name: N/A  . Number of Children: N/A  . Years of Education: N/A   Occupational History  . Not on file.   Social History Main Topics  . Smoking status: Never Smoker   . Smokeless tobacco: Never Used  . Alcohol Use: No  . Drug Use: No  . Sexual Activity: Not on file   Other Topics Concern  . Not on file   Social History Narrative     Family History  Problem Relation Age of Onset  . Pancreatic cancer Brother     Review of Systems: General: negative for chills, fever, weight changes. Positive for 'cold sweat' this am. ENT: negative for rhinorrhea or epistaxis Cardiovascular: positive for chest burning, shortness of breath Dermatological: negative for rash Respiratory: negative for cough or wheezing GI: positive for nausea, vomiting. Positive for diarrhea this am. GU: no hematuria, urgency, or frequency Neurologic: negative for visual changes, syncope, headache, or dizziness Heme: positive for easy bruising Endo: negative for excessive thirst, thyroid disorder, or flushing Musculoskeletal: negative for joint pain or swelling, negative for myalgias All other systems reviewed and are otherwise negative except as noted above.  Physical  Exam: Blood pressure 171/99, pulse 63, temperature 98.3 F (36.8 C), temperature source Oral, resp. rate 22, height 5\' 7"  (1.702 m), weight 185 lb (83.915 kg), SpO2 100 %. General: Well developed, well nourished, alert and oriented, pleasant overweight woman in no acute distress. HEENT: Normocephalic, atraumatic, sclera anicteric Neck: Supple. Carotids 2+ without bruits. JVP normal Lungs: Clear bilaterally to auscultation without wheezes, rales, or rhonchi. Breathing is unlabored. Heart:  RRR with normal S1 and S2. No murmurs, rubs, or gallops appreciated. Abdomen: Soft, non-tender, non-distended with normoactive bowel sounds. No hepatomegaly. No rebound/guarding. No obvious abdominal masses. Back: No CVA tenderness Msk:  Strength and tone appear normal for age. Extremities: No clubbing, cyanosis, or edema.  Distal pedal pulses are 2+ and equal bilaterally. Neuro: CNII-XII intact, moves all extremities spontaneously. Psych:  Responds to questions appropriately with a normal affect. Skin: warm and dry without rash   Labs:   Lab Results  Component Value Date   WBC 4.3 03/16/2014   HGB 13.3 03/16/2014   HCT 40.0 03/16/2014   MCV 86.2 03/16/2014   PLT 216 03/16/2014    Recent Labs Lab 03/16/14 0630  NA 140  K 3.4*  CL 107  CO2 24  BUN 13  CREATININE 1.06  CALCIUM 10.6*  PROT 7.1  BILITOT 0.4  ALKPHOS 89  ALT 16  AST 19  GLUCOSE 159*    Recent Labs  03/16/14 0630  TROPONINI <0.03   Lab Results  Component Value Date   CHOL 183 09/05/2012   HDL 58 09/05/2012   LDLCALC 102* 09/05/2012   TRIG 116 09/05/2012   No results found for: DDIMER  Radiology/Studies:  Dg Chest Port 1 View  03/16/2014   CLINICAL DATA:  Chest pain, nausea beginning this morning.  EXAM: PORTABLE CHEST - 1 VIEW  COMPARISON:  Chest radiograph September 04, 2012  FINDINGS: Cardiomediastinal silhouette is unremarkable for this low inspiratory portable examination with crowded vasculature markings. The lungs are clear without pleural effusions or focal consolidations. Trachea projects midline and there is no pneumothorax. Included soft tissue planes and osseous structures are non-suspicious.  IMPRESSION: Normal chest.   Electronically Signed   By: Elon Alas   On: 03/16/2014 06:43     EKG: Normal sinus rhythm with marked ST/T-wave abnormality consider acute inferior-posterior infarction versus anterolateral ischemia  CARDIAC STUDIES: Pending  ASSESSMENT AND PLAN:  1. This  patient has acute coronary syndrome with very high risk features of resting chest pain and dynamic EKG changes. There is pseudonormalization of her inferior T waves and marked ST depression both laterally and anteriorly. Differential diagnosis includes evolving early inferior STEMI versus diffuse ischemia. While her EKG does not meet formal criteria for STEMI, I think we need to proceed emergently to cardiac catheterization in the setting of these dynamic changes.   I have reviewed the risks, indications, and alternatives to cardiac catheterization and PCI  with the patient and her family. Risks include but are not limited to bleeding, infection, vascular injury, stroke, myocardial infection, arrhythmia, kidney injury, radiation-related injury in the case of prolonged fluoroscopy use, emergency cardiac surgery, and death. The patient understands the risks of serious complication is low (<8%).   2. Hypertension. Blood pressure currently elevated, will adjust antihypertensive medication regimen based on post catheterization findings.  3. Hypokalemia. Will replete potassium. The patient has active nausea and vomiting so will hold off until she is more stable.  Disposition: Pending cardiac catheterization/PCI results. The patient has been given heparin 4000 units in  the emergency department. She has received aspirin. Lipids will be checked and she will be started on a statin drug.  Signed, Sherren Mocha MD 03/16/2014, 9:14 AM

## 2014-03-16 NOTE — ED Notes (Signed)
ED PA and EDP at bedside to evaluate, pending consult to cardiology.

## 2014-03-17 ENCOUNTER — Encounter (HOSPITAL_COMMUNITY): Payer: Self-pay | Admitting: Cardiovascular Disease

## 2014-03-17 DIAGNOSIS — I214 Non-ST elevation (NSTEMI) myocardial infarction: Secondary | ICD-10-CM

## 2014-03-17 DIAGNOSIS — I201 Angina pectoris with documented spasm: Secondary | ICD-10-CM

## 2014-03-17 DIAGNOSIS — I10 Essential (primary) hypertension: Secondary | ICD-10-CM | POA: Diagnosis not present

## 2014-03-17 DIAGNOSIS — I2510 Atherosclerotic heart disease of native coronary artery without angina pectoris: Secondary | ICD-10-CM | POA: Diagnosis not present

## 2014-03-17 LAB — BASIC METABOLIC PANEL
ANION GAP: 6 (ref 5–15)
BUN: 12 mg/dL (ref 6–23)
CHLORIDE: 108 mmol/L (ref 96–112)
CO2: 27 mmol/L (ref 19–32)
Calcium: 9.3 mg/dL (ref 8.4–10.5)
Creatinine, Ser: 1.05 mg/dL (ref 0.50–1.10)
GFR calc Af Amer: 67 mL/min — ABNORMAL LOW (ref >90)
GFR calc non Af Amer: 57 mL/min — ABNORMAL LOW (ref >90)
Glucose, Bld: 136 mg/dL — ABNORMAL HIGH (ref 70–99)
POTASSIUM: 3.4 mmol/L — AB (ref 3.5–5.1)
SODIUM: 141 mmol/L (ref 135–145)

## 2014-03-17 LAB — LIPID PANEL
Cholesterol: 144 mg/dL (ref 0–200)
HDL: 39 mg/dL — ABNORMAL LOW (ref >39)
LDL CALC: 78 mg/dL (ref 0–99)
TRIGLYCERIDES: 136 mg/dL (ref <150)
Total CHOL/HDL Ratio: 3.7 RATIO
VLDL: 27 mg/dL (ref 0–40)

## 2014-03-17 LAB — TROPONIN I: Troponin I: 0.21 ng/mL — ABNORMAL HIGH (ref <0.031)

## 2014-03-17 MED ORDER — ATORVASTATIN CALCIUM 40 MG PO TABS: 40.0000 mg | ORAL_TABLET | Freq: Every day | ORAL | Status: DC

## 2014-03-17 MED ORDER — AMLODIPINE BESYLATE 10 MG PO TABS: 10.0000 mg | ORAL_TABLET | Freq: Every day | ORAL | Status: DC

## 2014-03-17 MED ORDER — ISOSORBIDE MONONITRATE ER 30 MG PO TB24: 30.0000 mg | ORAL_TABLET | Freq: Every day | ORAL | Status: DC

## 2014-03-17 MED ORDER — NITROGLYCERIN 0.4 MG SL SUBL: 0.4000 mg | SUBLINGUAL_TABLET | SUBLINGUAL | Status: DC | PRN

## 2014-03-17 MED ORDER — POTASSIUM CHLORIDE ER 10 MEQ PO TBCR: 10.0000 meq | EXTENDED_RELEASE_TABLET | Freq: Every day | ORAL | Status: DC

## 2014-03-17 NOTE — Progress Notes (Signed)
UR completed 

## 2014-03-17 NOTE — Progress Notes (Addendum)
Patient Name: Mary Randall Date of Encounter: 03/17/2014  Active Problems:   NSTEMI (non-ST elevated myocardial infarction)   Prinzmetal variant angina   Primary Cardiologist: Sherren Mocha Patient Profile: 59 y.o. Female with a known past medical history of HTN, and nonobstructive CAD with a cardiac catherzation performed in 2010 without stent placement who presented to ED yesterday with complaints of chest pain.   SUBJECTIVE: She slept well over night; no CP, SOB, N/V, or diaphoresis. She denies CP or SOB with ambulation in the room.   OBJECTIVE Filed Vitals:   03/16/14 2100 03/16/14 2314 03/17/14 0025 03/17/14 0601  BP: 105/48 116/83  114/69  Pulse: 71 77  63  Temp:  98 F (36.7 C)  97.9 F (36.6 C)  TempSrc:  Oral  Oral  Resp: 22 12  18   Height:      Weight:   185 lb 10 oz (84.2 kg)   SpO2: 97% 97%  97%    Intake/Output Summary (Last 24 hours) at 03/17/14 0723 Last data filed at 03/16/14 1848  Gross per 24 hour  Intake 2285.18 ml  Output    600 ml  Net 1685.18 ml   Filed Weights   03/16/14 0629 03/17/14 0025  Weight: 185 lb (83.915 kg) 185 lb 10 oz (84.2 kg)    PHYSICAL EXAM General: Well developed, well nourished, female in no acute distress. Head: Normocephalic, atraumatic.  Neck: Supple without bruits, No JVD. Lungs:  Resp regular and unlabored, CTA. Heart: RRR, S1, S2, no S3, S4, or murmur; no rub. Abdomen: Soft, non-tender, non-distended, BS + x 4.  Extremities: No clubbing, cyanosis, or edema. Dressing over cardiac cath insertion site in Right wrist dry and intact; no redness or ecchymosis.  Neuro: Alert and oriented X 3. Moves all extremities spontaneously. Psych: Normal affect.  LABS: CBC: Recent Labs  03/16/14 0630 03/16/14 1254  WBC 4.3 5.8  NEUTROABS 2.1  --   HGB 13.3 12.7  HCT 40.0 37.9  MCV 86.2 85.6  PLT 216 481   Basic Metabolic Panel: Recent Labs  03/16/14 0630 03/16/14 1254 03/17/14 0402  NA 140  --  141  K 3.4*  --   3.4*  CL 107  --  108  CO2 24  --  27  GLUCOSE 159*  --  136*  BUN 13  --  12  CREATININE 1.06 0.91 1.05  CALCIUM 10.6*  --  9.3   Liver Function Tests: Recent Labs  03/16/14 0630  AST 19  ALT 16  ALKPHOS 89  BILITOT 0.4  PROT 7.1  ALBUMIN 3.9   Cardiac Enzymes: Recent Labs  03/16/14 1254 03/16/14 2050 03/17/14 0015  TROPONINI 0.12* 0.18* 0.21*   BNP:  B NATRIURETIC PEPTIDE  Date/Time Value Ref Range Status  03/16/2014 06:30 AM 61.4 0.0 - 100.0 pg/mL Final   Fasting Lipid Panel: Recent Labs  03/17/14 0402  CHOL 144  HDL 39*  LDLCALC 78  TRIG 136  CHOLHDL 3.7    TELE: SR, 60.           ECG: NSR, 65.  Radiology/Studies: Dg Chest Port 1 View  03/16/2014   CLINICAL DATA:  Chest pain, nausea beginning this morning.  EXAM: PORTABLE CHEST - 1 VIEW  COMPARISON:  Chest radiograph September 04, 2012  FINDINGS: Cardiomediastinal silhouette is unremarkable for this low inspiratory portable examination with crowded vasculature markings. The lungs are clear without pleural effusions or focal consolidations. Trachea projects midline and there is no  pneumothorax. Included soft tissue planes and osseous structures are non-suspicious.  IMPRESSION: Normal chest.   Electronically Signed   By: Elon Alas   On: 03/16/2014 06:43     Current Medications:  . amLODipine  10 mg Oral Daily  . aspirin EC  325 mg Oral Daily  . atorvastatin  40 mg Oral q1800  . heparin  5,000 Units Subcutaneous 3 times per day  . hydrochlorothiazide  25 mg Oral Daily  . irbesartan  300 mg Oral Daily  . isosorbide mononitrate  30 mg Oral Daily      ASSESSMENT AND PLAN: Active Problems:   NSTEMI (non-ST elevated myocardial infarction)  -No CP or SOB overnight  -Troponin levels trended as followed <0.03, 0.12, 0.18, and 0.21  -Left cardiac cath completed yesterday with patent coronary arteries with mild diffuse nonobstructive disease. In LAD the 2nd diagonal has 50% ostial stenosis, mid LAD  after the second diagonal had diffuse 40-50% stenosis. LCx with minor 20% stenosis in the mid vessel. LVEF estimated at 60-65%.   -Continue Aspirin, atorvastatin, amlodipine, hydrochlorothiazide, and avapro.    Prinzmetal variant angina -No CP overnight -Continue amlodipine and isosorbide mononitrate.    Hypertension -Currently normotensive -Continue amlodipine, hydrochlorothiazide, and avapro   Hypokalemia -Potassium 3.4 on admission and today -Start K-dur 20 meq daily  Plan: DC today if she does well with ambulation, when can she go back to work?  Signed, Rosaria Ferries , PA-C 7:23 AM 03/17/2014    The patient was interviewed and all data reviewed including the coronary angiogram from yesterday. He coronary arteries are essentially normal.  Plan discharge today on LA nitrates as before and her other typical meds. Have CV f/u with Dr. Burt Knack in 2-3 weeks.

## 2014-03-17 NOTE — Discharge Summary (Signed)
CARDIOLOGY DISCHARGE SUMMARY   Patient ID: Mary Randall MRN: [id number] DOB/AGE: 1955/08/31 59 y.o.  Admit date: 03/16/2014 Discharge date: 03/17/2014  PCP: Darden Amber, PA Primary Cardiologist: Dr. Burt Knack  Primary Discharge Diagnosis:  NSTEMI  Secondary Discharge Diagnosis:    Prinzmetal variant angina  Procedures: Left Heart Cath, Selective Coronary Angiography, LV angiography  Hospital Course: Mary Randall is a 59 y.o. female with a history of non-obstructive CAD and spasm. She had chest pain and came to the hospital. She was admitted for further evaluation and treatment.  Her cardiac enzymes had some elevation, indicating a NSTEMI. She was taken to the cath lab on 03/14, results are below. There was no obstructive disease, but spasm was seen. Her EF was normal. Medical therapy was recommended, she was started on nitrates.  On 03/15, she was seen by Dr. Tamala Julian and all data were reviewed. She tolerated ambulation well, no chest pain or SOB. Her cath site was without ecchymosis or hematoma. No further inpatient workup was indicated and she was considered stable for discharge, to follow up as an outpatient. A message has been sent to the office to get her a f/u appt.   Labs:   Lab Results  Component Value Date   WBC 5.8 03/16/2014   HGB 12.7 03/16/2014   HCT 37.9 03/16/2014   MCV 85.6 03/16/2014   PLT 194 03/16/2014    Recent Labs Lab 03/16/14 0630  03/17/14 0402  NA 140  --  141  K 3.4*  --  3.4*  CL 107  --  108  CO2 24  --  27  BUN 13  --  12  CREATININE 1.06  < > 1.05  CALCIUM 10.6*  --  9.3  PROT 7.1  --   --   BILITOT 0.4  --   --   ALKPHOS 89  --   --   ALT 16  --   --   AST 19  --   --   GLUCOSE 159*  --  136*  < > = values in this interval not displayed.  Recent Labs  03/16/14 1254 03/16/14 2050 03/17/14 0015  TROPONINI 0.12* 0.18* 0.21*   Lipid Panel     Component Value Date/Time   CHOL 144 03/17/2014 0402   TRIG 136 03/17/2014 0402   HDL 39* 03/17/2014 0402   CHOLHDL 3.7 03/17/2014 0402   VLDL 27 03/17/2014 0402   LDLCALC 78 03/17/2014 0402    B NATRIURETIC PEPTIDE  Date/Time Value Ref Range Status  03/16/2014 06:30 AM 61.4 0.0 - 100.0 pg/mL Final      Radiology: Dg Chest Port 1 View  03/16/2014   CLINICAL DATA:  Chest pain, nausea beginning this morning.  EXAM: PORTABLE CHEST - 1 VIEW  COMPARISON:  Chest radiograph September 04, 2012  FINDINGS: Cardiomediastinal silhouette is unremarkable for this low inspiratory portable examination with crowded vasculature markings. The lungs are clear without pleural effusions or focal consolidations. Trachea projects midline and there is no pneumothorax. Included soft tissue planes and osseous structures are non-suspicious.  IMPRESSION: Normal chest.   Electronically Signed   By: Elon Alas   On: 03/16/2014 06:43    Cardiac Cath: 03/17/2014 Left mainstem: The left main stem is widely patent. The vessel has minor irregularity and divides into the LAD and left circumflex. There is no stenosis.  Left anterior descending (LAD): The LAD has very minimal stenosis at the ostium. The diagonal branches are patent. The  first diagonal has minor irregularity. The second diagonal has 50% ostial stenosis. The proximal LAD has no significant disease. The mid LAD after the second diagonal has diffuse 40-50% stenosis. The LAD wraps around the inferoapex.  Left circumflex (LCx): The left circumflex is large in caliber. The vessel has no significant obstruction through the proximal and mid segments. There is minor 20% stenosis in the mid vessel. The obtuse marginal branches are patent with mild nonobstructive disease noted.  Right coronary artery (RCA): This is a large, dominant vessel. The PDA and PLA branches are patent. There is no stenosis throughout the RCA distribution.  Left ventriculography: Left ventricular systolic function is normal, LVEF is estimated at 60-65%, there is no significant  mitral regurgitation   Contrast: 85 cc Omnipaque  Estimated Blood Loss: minimal  Final Conclusions:  1. 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 Normal LV systolic function with normal LVEDP  Recommendations: Somewhat unclear as to the etiology of this patient's fairly profound symptoms and EKG changes. 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 The patient had previously been on long-acting nitrates and ran out of medication recently. It is also possible that she has some sort of GI pathology but difficult to explain her EKG changes. We'll start back on long-acting nitrate therapy. The patient should have serial cardiac enzymes and overnight observation in the hospital.  EKG: SR, No acute changes  FOLLOW UP PLANS AND APPOINTMENTS Allergies  Allergen Reactions  . Codeine     Dizzy,vomiting     Medication List    TAKE these medications        amLODipine 10 MG tablet  Commonly known as:  NORVASC  Take 1 tablet (10 mg total) by mouth daily.     aspirin EC 325 MG tablet  Take 325 mg by mouth daily.     atorvastatin 40 MG tablet  Commonly known as:  LIPITOR  Take 1 tablet (40 mg total) by mouth daily at 6 PM.  Notes to Patient:  NEW MEDICINE     hydrochlorothiazide 25 MG tablet  Commonly known as:  HYDRODIURIL  Take 25 mg by mouth daily.     irbesartan 300 MG tablet  Commonly known as:  AVAPRO  Take 300 mg by mouth daily.     isosorbide mononitrate 30 MG 24 hr tablet  Commonly known as:  IMDUR  Take 1 tablet (30 mg total) by mouth daily.     nitroGLYCERIN 0.4 MG SL tablet  Commonly known as:  NITROSTAT  Place 1 tablet (0.4 mg total) under the tongue every 5 (five) minutes as needed for chest pain.  Notes to Patient:  NEW MEDICINE     potassium chloride 10 MEQ tablet  Commonly known as:  K-DUR  Take 1 tablet (10 mEq total) by mouth daily.  Notes to Patient:  NEW MEDICINE        Discharge  Instructions    Diet - low sodium heart healthy    Complete by:  As directed      Increase activity slowly    Complete by:  As directed             BRING ALL MEDICATIONS WITH YOU TO FOLLOW UP APPOINTMENTS  Time spent with patient to include physician time: 41 min Signed: Rosaria Ferries, PA-C 03/17/2014, 5:18 PM Co-Sign MD

## 2014-03-23 ENCOUNTER — Telehealth: Payer: Self-pay | Admitting: Cardiovascular Disease

## 2014-03-23 NOTE — Telephone Encounter (Signed)
New Msg        1. Are you calling in reference to your FMLA or disability form? FMLA  2. What is your question in regards to FMLA or disability form? Pt would to like to drop off FMLA papers tomorrow. States she has 15 days to have completed    Do you need copies of your medical records? No   3. Are you waiting on a nurse to call you back with results or are you wanting copies of your results? No, pt would like a call back to discuss this.   Please call back.

## 2014-03-24 NOTE — Telephone Encounter (Signed)
I spoke with the pt and she will bring FMLA paperwork into the office today.

## 2014-03-25 NOTE — Telephone Encounter (Signed)
I spoke with the pt and made her aware that FMLA forms will be placed at the front desk for pick-up.

## 2014-03-27 ENCOUNTER — Encounter: Payer: Self-pay | Admitting: Cardiology

## 2014-03-27 ENCOUNTER — Ambulatory Visit (INDEPENDENT_AMBULATORY_CARE_PROVIDER_SITE_OTHER): Payer: Federal, State, Local not specified - PPO | Admitting: Cardiology

## 2014-03-27 VITALS — BP 128/76 | HR 76 | Ht 67.0 in | Wt 192.0 lb

## 2014-03-27 DIAGNOSIS — E785 Hyperlipidemia, unspecified: Secondary | ICD-10-CM | POA: Diagnosis not present

## 2014-03-27 DIAGNOSIS — E876 Hypokalemia: Secondary | ICD-10-CM

## 2014-03-27 DIAGNOSIS — I214 Non-ST elevation (NSTEMI) myocardial infarction: Secondary | ICD-10-CM | POA: Diagnosis not present

## 2014-03-27 DIAGNOSIS — I201 Angina pectoris with documented spasm: Secondary | ICD-10-CM | POA: Diagnosis not present

## 2014-03-27 LAB — BASIC METABOLIC PANEL
BUN: 20 mg/dL (ref 6–23)
CO2: 30 mEq/L (ref 19–32)
CREATININE: 1.08 mg/dL (ref 0.50–1.10)
Calcium: 10.8 mg/dL — ABNORMAL HIGH (ref 8.4–10.5)
Chloride: 101 mEq/L (ref 96–112)
GLUCOSE: 97 mg/dL (ref 70–99)
Potassium: 4.1 mEq/L (ref 3.5–5.3)
Sodium: 139 mEq/L (ref 135–145)

## 2014-03-27 NOTE — Progress Notes (Signed)
Cardiology Office Note   Date:  03/27/2014   ID:  Mary Randall, DOB [birth year], MRN [id number]  PCP:  Darden Amber, PA  Cardiologist:  Dr. Burt Knack    Chief Complaint  Patient presents with  . Hospitalization Follow-up    for NSTEMI due to coronary spasm, no pain today      History of Present Illness: Mary Randall is a 59 y.o. female who presents for follow up after hospitalization for NSTEMI due to coronary spasm,  She also has non obstructive CAD.  She had run out of Imdur just prior to this occurrence.   Admitted 03/16/14 and d/c'd 03/17/14.  She did well without complications.  Cardiac cath on  03/16/14 Final Conclusions:  1. 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 Normal LV systolic function with normal LVEDP Troponin peaked at 0.18.  Her K+ 3.4- will recheck.  Today she has had no further pain, no SOB, exercising by walking daily.  Eating Healthy.    Past Medical History  Diagnosis Date  . Hypertension   . Angina effort   . NSTEMI (non-ST elevated myocardial infarction) 03/16/2014    non-obs CAD, spasm seen  . 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 Arthritis     "right knee" (03/16/2014)  . Pneumonia 2009    Past Surgical History  Procedure Laterality Date  . Other surgical history  1991    Tubal pregnancy  . Cardiac catheterization  2010; 03/16/2014  . Tubal ligation  1981  . Left heart catheterization with coronary angiogram N/A 03/16/2014    Procedure: LEFT HEART CATHETERIZATION WITH CORONARY ANGIOGRAM;  Surgeon: Sherren Mocha, MD; Non-obs CAD, EF 55%     Current Outpatient Prescriptions  Medication Sig Dispense Refill  . amLODipine (NORVASC) 10 MG tablet Take 1 tablet (10 mg total) by mouth daily. 30 tablet 11  . aspirin EC 81 MG tablet Take 81 mg by mouth daily.    Marland Kitchen atorvastatin (LIPITOR) 40 MG tablet Take 1 tablet (40 mg total) by mouth daily at 6 PM. 30 tablet 11  .  hydrochlorothiazide (HYDRODIURIL) 25 MG tablet Take 25 mg by mouth daily.    . irbesartan (AVAPRO) 300 MG tablet Take 300 mg by mouth daily.     . isosorbide mononitrate (IMDUR) 30 MG 24 hr tablet Take 1 tablet (30 mg total) by mouth daily. 30 tablet 11  . nitroGLYCERIN (NITROSTAT) 0.4 MG SL tablet Place 1 tablet (0.4 mg total) under the tongue every 5 (five) minutes as needed for chest pain. 25 tablet 12  . potassium chloride (K-DUR) 10 MEQ tablet Take 1 tablet (10 mEq total) by mouth daily. 30 tablet 11   No current facility-administered medications for this visit.    Allergies:   Codeine and Carvedilol    Social History:  The patient  reports that she has never smoked. She has never used smokeless tobacco. She reports that she does not drink alcohol or use illicit drugs.   Family History:  The patient's family history includes CVA in her father; Cancer in her mother; Diabetes type II in her mother; Healthy in her sister and sister; Hypertension in her mother; Pancreatic cancer in her brother.    ROS:  General:no colds or fevers, no weight changes Skin:no rashes or ulcers HEENT:no blurred vision, no congestion CV:see HPI PUL:see HPI GI:no diarrhea constipation or melena, no indigestion GU:no hematuria, no dysuria MS:no joint pain, no claudication Neuro:no syncope,  no lightheadedness Endo:no diabetes, no thyroid disease  Wt Readings from Last 3 Encounters:  03/27/14 192 lb (87.091 kg)  03/17/14 185 lb 10 oz (84.2 kg)  09/05/12 186 lb 14.4 oz (84.777 kg)     PHYSICAL EXAM: VS:  BP 128/76 mmHg  Pulse 76  Ht 5\' 7"  (1.702 m)  Wt 192 lb (87.091 kg)  BMI 30.06 kg/m2 , BMI Body mass index is 30.06 kg/(m^2). General:Pleasant affect, NAD Skin:Warm and dry, brisk capillary refill HEENT:normocephalic, sclera clear, mucus membranes moist Neck:supple, no JVD, no bruits  Heart:S1S2 RRR without murmur, gallup, rub or click Lungs:clear without rales, rhonchi, or wheezes ZTI:WPYK, non  tender, + BS, do not palpate liver spleen or masses Ext:no lower ext edema, 2+ pedal pulses, 2+ radial pulses Neuro:alert and oriented X 3, MAE, follows commands, + facial symmetry    EKG:  EKG is ordered today. The ekg ordered today demonstrates SR, t wave inversion in II,III, V3-6 but without Changes.    Recent Labs: 03/16/2014: ALT 16; B Natriuretic Peptide 61.4; Hemoglobin 12.7; Platelets 194 03/17/2014: BUN 12; Creatinine 1.05; Potassium 3.4*; Sodium 141    Lipid Panel    Component Value Date/Time   CHOL 144 03/17/2014 0402   TRIG 136 03/17/2014 0402   HDL 39* 03/17/2014 0402   CHOLHDL 3.7 03/17/2014 0402   VLDL 27 03/17/2014 0402   LDLCALC 78 03/17/2014 0402       Other studies Reviewed: Additional studies/ records that were reviewed today include: Hospital notes, labs, cath note.   ASSESSMENT AND PLAN:  NSTEMI due to coronary spasm, this occurred off Imdur, now back on and on amlodipine.  She will follow up with Dr. Burt Knack in 6 weeks or so.  She returns to work on Monday.  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  CAD non obstuctive -per cath 03/16/14, 2nd diag with 50% stenosis, mLAD 40-50% stenosis. LCX minor 20% stenosis, RCA patent.  EF 60-65%.  Hypokalemia, recheck today.  Hyperlipidemia with recent LDL of 78 on lipitor 40 mg.   Current medicines are reviewed with the patient today.  The patient Has no concerns regarding medicines.  The following changes have been made:  See above Labs/ tests ordered today include:see above  Disposition:   FU:  see above  Lennie Muckle, NP  03/27/2014 11:29 AM    [facility] [place], [place], [place], [place] Phone: [phone]; Fax: [phone]

## 2014-03-27 NOTE — Addendum Note (Signed)
Addended by: Isaiah Serge on: 03/27/2014 11:51 AM   Modules accepted: Level of Service

## 2014-03-27 NOTE — Patient Instructions (Addendum)
Your physician recommends that you continue on your current medications as directed. Please refer to the Current Medication list given to you today.  LABS TODAY BMP   FOLLOW UP DR COOPER IN 6 TO 8 WEEKS

## 2014-03-31 ENCOUNTER — Telehealth: Payer: Self-pay | Admitting: *Deleted

## 2014-03-31 NOTE — Telephone Encounter (Signed)
-----   Message from Isaiah Serge, NP sent at 03/27/2014  4:53 PM EDT ----- Labs stable K+ normal, but increase water intake and will recheck BMP in 2 weeks.

## 2014-04-17 DIAGNOSIS — I10 Essential (primary) hypertension: Secondary | ICD-10-CM | POA: Insufficient documentation

## 2014-05-12 ENCOUNTER — Encounter: Payer: Self-pay | Admitting: Cardiovascular Disease

## 2014-05-12 ENCOUNTER — Ambulatory Visit (INDEPENDENT_AMBULATORY_CARE_PROVIDER_SITE_OTHER): Payer: Federal, State, Local not specified - PPO | Admitting: Cardiovascular Disease

## 2014-05-12 VITALS — BP 126/82 | HR 81 | Ht 67.0 in | Wt 195.0 lb

## 2014-05-12 DIAGNOSIS — I201 Angina pectoris with documented spasm: Secondary | ICD-10-CM

## 2014-05-12 DIAGNOSIS — I10 Essential (primary) hypertension: Secondary | ICD-10-CM | POA: Diagnosis not present

## 2014-05-12 NOTE — Patient Instructions (Addendum)
Medication Instructions:  Your physician recommends that you continue on your current medications as directed. Please refer to the Current Medication list given to you today.   Labwork: Your physician recommends that you return for lab work in: [place] (BMET) Testing/Procedures: None  Follow-Up: Your physician recommends that you schedule a follow-up appointment in: 6 MONTHS with Miguel Rota, PA   Any Other Special Instructions Will Be Listed Below (If Applicable).

## 2014-05-12 NOTE — Progress Notes (Signed)
Cardiology Office Note   Date:  05/12/2014   ID:  MEHEK GREGA, DOB [birth year], MRN [id number]  PCP:  Darden Amber, PA  Cardiologist:  Sherren Mocha, MD    No chief complaint on file.    History of Present Illness: Mary Randall is a 59 y.o. female who presents for follow-up of variant angina. The patient presented in March 2016 with acute coronary syndrome. She underwent cardiac catheterization March 14 demonstrating mild diffuse nonobstructive CAD. She was noted to have normal LV systolic function and normal LVEDP. EKG was noted to be markedly abnormal. Cardiac enzymes were negative. She was felt to have coronary vasospasm as the most likely etiology of her symptoms. She was treated with isosorbide and amlodipine. She's also been on aspirin and atorvastatin.  The patient had been on these medications long-term, but she was having difficulty getting refills and had been off of the isosorbide leading up to her hospitalization.  She feels well. She has no complaints of chest pain or shortness of breath. She denies exertional symptoms. She continues to work as a Development worker, community carrier , but she admits hasn't been doing much walking.   Past Medical History  Diagnosis Date  . Hypertension   . Angina effort   . NSTEMI (non-ST elevated myocardial infarction) 03/16/2014    non-obs CAD, spasm seen  . 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 Arthritis     "right knee" (03/16/2014)  . Pneumonia 2009    Past Surgical History  Procedure Laterality Date  . Other surgical history  1991    Tubal pregnancy  . Cardiac catheterization  2010; 03/16/2014  . Tubal ligation  1981  . Left heart catheterization with coronary angiogram N/A 03/16/2014    Procedure: LEFT HEART CATHETERIZATION WITH CORONARY ANGIOGRAM;  Surgeon: Sherren Mocha, MD; Non-obs CAD, EF 55%    Current Outpatient Prescriptions  Medication Sig Dispense Refill  . amLODipine (NORVASC) 10 MG tablet Take 1 tablet (10  mg total) by mouth daily. 30 tablet 11  . aspirin EC 81 MG tablet Take 81 mg by mouth daily.    Marland Kitchen atorvastatin (LIPITOR) 40 MG tablet Take 1 tablet (40 mg total) by mouth daily at 6 PM. 30 tablet 11  . hydrochlorothiazide (HYDRODIURIL) 25 MG tablet Take 25 mg by mouth daily.    . irbesartan (AVAPRO) 300 MG tablet Take 300 mg by mouth daily.     . isosorbide mononitrate (IMDUR) 30 MG 24 hr tablet Take 1 tablet (30 mg total) by mouth daily. 30 tablet 11  . nitroGLYCERIN (NITROSTAT) 0.4 MG SL tablet Place 1 tablet (0.4 mg total) under the tongue every 5 (five) minutes as needed for chest pain. 25 tablet 12  . potassium chloride (K-DUR) 10 MEQ tablet Take 1 tablet (10 mEq total) by mouth daily. 30 tablet 11   No current facility-administered medications for this visit.    Allergies:   Codeine and Carvedilol   Social History:  The patient  reports that she has never smoked. She has never used smokeless tobacco. She reports that she does not drink alcohol or use illicit drugs.   Family History:  The patient's  family history includes CVA in her father; Cancer in her mother; Diabetes type II in her mother; Healthy in her sister and sister; Hypertension in her mother; Pancreatic cancer in her brother.    ROS:  Please see the history of present illness.  All other systems are reviewed and negative.  PHYSICAL EXAM: VS:  BP 126/82 mmHg  Pulse 81  Ht 5\' 7"  (1.702 m)  Wt 195 lb (88.451 kg)  BMI 30.53 kg/m2  SpO2 98% , BMI Body mass index is 30.53 kg/(m^2). GEN: Well nourished, well developed, in no acute distress HEENT: normal Neck: no JVD, no masses. No carotid bruits Cardiac: RRR without murmur or gallop                Respiratory:  clear to auscultation bilaterally, normal work of breathing GI: soft, nontender, nondistended, + BS MS: no deformity or atrophy Ext: no pretibial edema, pedal pulses 2+= bilaterally Skin: warm and dry, no rash Neuro:  Strength and sensation are intact Psych:  euthymic mood, full affect  EKG:  EKG is not ordered today.  Recent Labs: 03/16/2014: ALT 16; B Natriuretic Peptide 61.4; Hemoglobin 12.7; Platelets 194 03/27/2014: BUN 20; Creatinine 1.08; Potassium 4.1; Sodium 139   Lipid Panel     Component Value Date/Time   CHOL 144 03/17/2014 0402   TRIG 136 03/17/2014 0402   HDL 39* 03/17/2014 0402   CHOLHDL 3.7 03/17/2014 0402   VLDL 27 03/17/2014 0402   LDLCALC 78 03/17/2014 0402      Wt Readings from Last 3 Encounters:  05/12/14 195 lb (88.451 kg)  03/27/14 192 lb (87.091 kg)  03/17/14 185 lb 10 oz (84.2 kg)     Cardiac Studies Reviewed: Cardiac catheterization 03/16/2014: Procedural Findings: Hemodynamics: AO 113/70 with a mean of 91 LV 117/12  Coronary angiography: Coronary dominance: right  Left mainstem: The left main stem is widely patent. The vessel has minor irregularity and divides into the LAD and left circumflex. There is no stenosis.  Left anterior descending (LAD): The LAD has very minimal stenosis at the ostium. The diagonal branches are patent. The first diagonal has minor irregularity. The second diagonal has 50% ostial stenosis. The proximal LAD has no significant disease. The mid LAD after the second diagonal has diffuse 40-50% stenosis. The LAD wraps around the inferoapex.  Left circumflex (LCx): The left circumflex is large in caliber. The vessel has no significant obstruction through the proximal and mid segments. There is minor 20% stenosis in the mid vessel. The obtuse marginal branches are patent with mild nonobstructive disease noted.  Right coronary artery (RCA): This is a large, dominant vessel. The PDA and PLA branches are patent. There is no stenosis throughout the RCA distribution.  Left ventriculography: Left ventricular systolic function is normal, LVEF is estimated at 60-65%, there is no significant mitral regurgitation   Contrast: 85 cc Omnipaque  Estimated Blood Loss: minimal  Final  Conclusions:  1. 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 Normal LV systolic function with normal LVEDP  ASSESSMENT AND PLAN: 1.   Prinzmetal angina: Stable on her current medical program with no recurrence of anginal symptoms. Medications reviewed today.  2. Essential hypertension, controlled. Continue current medical program. The patient is on multidrug antihypertensive therapy.  3. Hyperlipidemia: Most recent lipids reviewed. She continues on a statin drug.  Current medicines are reviewed with the patient today.  The patient does not have concerns regarding medicines.  Labs/ tests ordered today include:   Orders Placed This Encounter  Procedures  . Basic metabolic panel   Disposition:   FU 6 months with APP, metabolic panel at that time.  Deatra James, MD  05/12/2014 [facility] [place], [place], [place]  [address]  Phone: [phone]; Fax: [phone]

## 2014-07-03 ENCOUNTER — Other Ambulatory Visit: Payer: Self-pay

## 2014-07-03 DIAGNOSIS — Z1231 Encounter for screening mammogram for malignant neoplasm of breast: Secondary | ICD-10-CM

## 2014-07-21 ENCOUNTER — Ambulatory Visit
Admission: RE | Admit: 2014-07-21 | Discharge: 2014-07-21 | Disposition: A | Discharge status: Home / Self Care | Payer: Federal, State, Local not specified - PPO | Source: Ambulatory Visit

## 2014-07-21 DIAGNOSIS — Z1231 Encounter for screening mammogram for malignant neoplasm of breast: Secondary | ICD-10-CM

## 2014-07-22 ENCOUNTER — Other Ambulatory Visit: Payer: Self-pay | Admitting: Family Medicine

## 2014-07-22 DIAGNOSIS — R928 Other abnormal and inconclusive findings on diagnostic imaging of breast: Secondary | ICD-10-CM

## 2014-08-06 ENCOUNTER — Ambulatory Visit
Admission: RE | Admit: 2014-08-06 | Discharge: 2014-08-06 | Disposition: A | Discharge status: Home / Self Care | Payer: Federal, State, Local not specified - PPO | Source: Ambulatory Visit | Attending: Family Medicine | Admitting: Family Medicine

## 2014-08-06 ENCOUNTER — Other Ambulatory Visit: Payer: Self-pay | Admitting: Family Medicine

## 2014-08-06 DIAGNOSIS — R928 Other abnormal and inconclusive findings on diagnostic imaging of breast: Secondary | ICD-10-CM

## 2014-08-06 DIAGNOSIS — N6320 Unspecified lump in the left breast, unspecified quadrant: Secondary | ICD-10-CM

## 2014-08-06 DIAGNOSIS — R599 Enlarged lymph nodes, unspecified: Secondary | ICD-10-CM

## 2014-08-06 HISTORY — PX: BREAST BIOPSY: SHX20

## 2014-08-12 ENCOUNTER — Telehealth: Payer: Self-pay | Admitting: Cardiovascular Disease

## 2014-08-12 ENCOUNTER — Other Ambulatory Visit: Payer: Self-pay | Admitting: General Surgery

## 2014-08-12 NOTE — Telephone Encounter (Signed)
New message      Request for surgical clearance:  1. What type of surgery is being performed? Porta cath placed  2. When is this surgery scheduled? Pending clearance----want to do it next week  3. Are there any medications that need to be held prior to surgery and how long?  need medical clearance   4. Name of physician performing surgery? Dr Marlou Starks  5. What is your office phone and fax number? [phone]

## 2014-08-12 NOTE — Telephone Encounter (Signed)
Spoke with Mary Randall in dr. Marlou Starks office to make her aware Dr. Burt Knack is out of the office until next week.  Let her know that we will not be able to clear her until he returns.  She stated she will wait to hear from our office.

## 2014-08-13 ENCOUNTER — Telehealth: Payer: Self-pay | Admitting: *Deleted

## 2014-08-13 NOTE — Telephone Encounter (Signed)
Left vm for pt to return call to schedule new pt appt. Contact information given.

## 2014-08-14 ENCOUNTER — Telehealth: Payer: Self-pay | Admitting: *Deleted

## 2014-08-14 NOTE — Telephone Encounter (Signed)
Scheduled and confirmed new pt appt with Dr. Burr Medico on 08/17/14 at 2:30/3:00. Gave directions and instruction as well as contact information. Denies further needs at this time. Encourage pt to call with questions or concern. Received verbal understanding. [facility] notified of pt appt.

## 2014-08-16 DIAGNOSIS — C50212 Malignant neoplasm of upper-inner quadrant of left female breast: Secondary | ICD-10-CM | POA: Insufficient documentation

## 2014-08-16 NOTE — Telephone Encounter (Signed)
Chart reviewed. Pt with prinzmetal's angina and mild CAD at recent cath. Stable at time of office visit earlier this year. Ok to proceed with Port-a-cath placement without further cardiac testing. Available if problems arise. thx

## 2014-08-16 NOTE — Progress Notes (Signed)
[facility]  [phone]) [phone] [phone]  Clinic New Consult Note   Patient Care Team: Christ Kick, [place] as PCP - General 08/17/2014  CHIEF COMPLAINTS/PURPOSE OF CONSULTATION:  Newly diagnosed left breast cancer   Oncology History   Breast cancer of upper-inner quadrant of left female breast   Staging form: Breast, AJCC 7th Edition     Clinical: Stage IIIA (T3, N1, M0) - Unsigned       Breast cancer of upper-inner quadrant of left female breast   08/06/2014 Initial Diagnosis Breast cancer of upper-inner quadrant of left female breast   08/06/2014 Initial Biopsy Left breast 9:30 o'clock position showed invasive ductal carcinoma, and 1 left axillary lymph node biopsy showed positive for metastatic carcinoma.   08/06/2014 Receptors her2 ER 100% positive, PR 5% positive, Ki-67 50%   08/06/2014 Mammogram Diagnostic mammogram and ultrasound showed left breast mass with associated calcification measuring 5.5 cm, indeterminate 8 left axillary lymph node was cortical thickening, circumscribed low density oval 63mm mass at 12:00 within the left breast.    HISTORY OF PRESENTING ILLNESS:  Mary Randall 59 y.o. female is here because of recently newly diagnosed left breast cancer. She presents to the clinic with her daughter.  This was discovered by screening mammogram. Her prior mammogram was 2 years ago. She noticed mild left nipple retraction, no palpable mass, no other changes. She feels well overall, denies any significant pain except mild right knee pain from arthritis. She denies any change of her appetite, or recent weight loss. She works for post office, Games developer, and is physically active.   Her family history is notable for breast cancer in her brother, pancreatic cancer in another brother, and colon cancer in her mother. She is postmenopausal, last menstrual period 8 years ago. She has a daughter and a son. She lives with her brother, and her daughter is  likely going to stay with her.  MEDICAL HISTORY:  Past Medical History  Diagnosis Date  . Hypertension   . Angina effort   . NSTEMI (non-ST elevated myocardial infarction) 03/16/2014    non-obs CAD, spasm seen  . 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 Arthritis     "right knee" (03/16/2014)  . Pneumonia 2009    SURGICAL HISTORY: Past Surgical History  Procedure Laterality Date  . Other surgical history  1991    Tubal pregnancy  . Cardiac catheterization  2010; 03/16/2014  . Tubal ligation  1981  . Left heart catheterization with coronary angiogram N/A 03/16/2014    Procedure: LEFT HEART CATHETERIZATION WITH CORONARY ANGIOGRAM;  Surgeon: Tonny Bollman, MD; Non-obs CAD, EF 55%   GYN HISTORY  Menarchal: 15 LMP: 51 Contraceptive: 4 HRT: no G2P2: no breast feeding     SOCIAL HISTORY: Social History   Social History  . Marital Status: Legally Separated    Spouse Name: N/A  . Number of Children: Daughter 44, son 56   . Years of Education: N/A   Occupational History  . Works for post office    Social History Main Topics  . Smoking status: Never Smoker   . Smokeless tobacco: Never Used  . Alcohol Use: No  . Drug Use: No  . Sexual Activity: No   Other Topics Concern  . Not on file   Social History Narrative    FAMILY HISTORY: Family History  Problem Relation Age of Onset  . Pancreatic cancer Brother 23  . Cancer Brother 56  breast cancer   . Cancer Mother 60    colon cancer   . Hypertension Mother   . Diabetes type II Mother   . CVA Father   . Healthy Sister   . Healthy Sister     ALLERGIES:  is allergic to codeine and carvedilol.  MEDICATIONS:  Current Outpatient Prescriptions  Medication Sig Dispense Refill  . amLODipine (NORVASC) 10 MG tablet Take 1 tablet (10 mg total) by mouth daily. 30 tablet 11  . aspirin EC 81 MG tablet Take 81 mg by mouth daily.    Marland Kitchen atorvastatin (LIPITOR) 40 MG tablet Take 1 tablet (40 mg total) by  mouth daily at 6 PM. 30 tablet 11  . hydrochlorothiazide (HYDRODIURIL) 25 MG tablet Take 25 mg by mouth daily.    . irbesartan (AVAPRO) 300 MG tablet Take 300 mg by mouth daily.     . isosorbide mononitrate (IMDUR) 30 MG 24 hr tablet Take 1 tablet (30 mg total) by mouth daily. 30 tablet 11  . potassium chloride (K-DUR) 10 MEQ tablet Take 1 tablet (10 mEq total) by mouth daily. 30 tablet 11  . nitroGLYCERIN (NITROSTAT) 0.4 MG SL tablet Place 1 tablet (0.4 mg total) under the tongue every 5 (five) minutes as needed for chest pain. (Patient not taking: Reported on 08/17/2014) 25 tablet 12   No current facility-administered medications for this visit.    REVIEW OF SYSTEMS:   Constitutional: Denies fevers, chills or abnormal night sweats Eyes: Denies blurriness of vision, double vision or watery eyes Ears, nose, mouth, throat, and face: Denies mucositis or sore throat Respiratory: Denies cough, dyspnea or wheezes Cardiovascular: Denies palpitation, chest discomfort or lower extremity swelling Gastrointestinal:  Denies nausea, heartburn or change in bowel habits Skin: Denies abnormal skin rashes Lymphatics: Denies new lymphadenopathy or easy bruising Neurological:Denies numbness, tingling or new weaknesses Behavioral/Psych: Mood is stable, no new changes  All other systems were reviewed with the patient and are negative.  PHYSICAL EXAMINATION: ECOG PERFORMANCE STATUS: 0 - Asymptomatic  Filed Vitals:   08/17/14 1538  BP: 140/99  Pulse: 75  Temp: 98.7 F (37.1 C)  Resp: 18   Filed Weights   08/17/14 1538  Weight: 198 lb 12.8 oz (90.175 kg)    GENERAL:alert, no distress and comfortable SKIN: skin color, texture, turgor are normal, no rashes or significant lesions EYES: normal, conjunctiva are pink and non-injected, sclera clear OROPHARYNX:no exudate, no erythema and lips, buccal mucosa, and tongue normal  NECK: supple, thyroid normal size, non-tender, without nodularity LYMPH:  no  palpable lymphadenopathy in the cervical, axillary or inguinal LUNGS: clear to auscultation and percussion with normal breathing effort HEART: regular rate & rhythm and no murmurs and no lower extremity edema ABDOMEN:abdomen soft, non-tender and normal bowel sounds Musculoskeletal:no cyanosis of digits and no clubbing  PSYCH: alert & oriented x 3 with fluent speech NEURO: no focal motor/sensory deficits Breasts: Breast inspection showed them to be symmetrical with no nipple discharge. Slight left nipple retraction. A 2.5 cm mass behind the nipple. Palpation of the right breasts and both axillas revealed no obvious mass that I could appreciate.   LABORATORY DATA:  I have reviewed the data as listed Lab Results  Component Value Date   WBC 5.8 03/16/2014   HGB 12.7 03/16/2014   HCT 37.9 03/16/2014   MCV 85.6 03/16/2014   PLT 194 03/16/2014    Recent Labs  03/16/14 0630 03/16/14 1254 03/17/14 0402 03/27/14 1153  NA 140  --  141 139  K 3.4*  --  3.4* 4.1  CL 107  --  108 101  CO2 24  --  27 30  GLUCOSE 159*  --  136* 97  BUN 13  --  12 20  CREATININE 1.06 0.91 1.05 1.08  CALCIUM 10.6*  --  9.3 10.8*  GFRNONAA 57* 68* 57*  --   GFRAA 66* 79* 67*  --   PROT 7.1  --   --   --   ALBUMIN 3.9  --   --   --   AST 19  --   --   --   ALT 16  --   --   --   ALKPHOS 89  --   --   --   BILITOT 0.4  --   --   --     PATHOLOGY REPORT:  Diagnosis 08/06/2014 1. Breast, left, needle core biopsy, 9:30 o'clock, 1 CMFN - INVASIVE DUCTAL CARCINOMA, SEE COMMENT. 2. Lymph node, needle/core biopsy, left axillary lymph node - ONE LYMPH NODE, POSITIVE FOR METASTATIC MAMMARY CARCINOMA (1/1) 1. Although the grade of tumor is best assessed at resection, with these biopsies, the invasive tumor is grade 1. Breast prognostic studies are pending (part 1 only) and will be reported in an addendum. The case is reviewed with Dr. Avis Epley who concurs. (CRR:gt, 08/07/14)  Results: IMMUNOHISTOCHEMICAL AND  MORPHOMETRIC ANALYSIS BY THE AUTOMATED CELLULAR IMAGING SYSTEM (ACIS) Estrogen Receptor: 100%, POSITIVE, STRONG STAINING INTENSITY (PERFORMED MANUALLY) Progesterone Receptor: 5%, POSITIVE, WEAK STAINING INTENSITY Proliferation Marker Ki67: 50% (PERFORMED MANUALLY)  RADIOGRAPHIC STUDIES: I have personally reviewed the radiological images as listed and agreed with the findings in the report.  Screening mammogram 07/21/2014 FINDINGS: In the left breast, a possible mass warrants further evaluation. In the right breast, no findings suspicious for malignancy.  Images were processed with CAD.  IMPRESSION: Further evaluation is suggested for possible mass in the left breast.  RECOMMENDATION: Diagnostic mammogram and possibly ultrasound of the left breast. (Code:FI-L-11M)  The patient will be contacted regarding the findings, and additional imaging will be scheduled.  BI-RADS CATEGORY  0: Incomplete. Need additional imaging evaluation and/or prior mammograms for comparison.   Electronically Signed   By: Lajean Manes M.D.   On: 07/21/2014 13:09   US Breast Ltd Uni Left Inc Axilla 08/06/2014    FINDINGS: CC and MLO views of the left breast with tomosynthesis and CC and ML magnification views were performed. There is an approximately 2 cm irregular mass within the slightly medial, subareolar left breast. In addition, there are associated coarse heterogeneous calcifications. In total, the mass and calcifications span approximately 5.5 cm. A prominent lymph node is noted within the left axilla. Several adjacent oval, circumscribed low-density masses are noted within the left breast at 12 o'clock, middle depth.  Mammographic images were processed with CAD.  On physical exam, a palpable mass is noted within the left breast at 930, 1 cm from the nipple. No discrete mass is felt in the 12 o'clock region.  Targeted ultrasound is performed, showing an irregular hypoechoic mass at 930, 1 cm from the nipple measuring  approximately 15 x 13 x 14 mm, corresponding to the palpable abnormality on exam. Dilated ducts extend from the mass to the level of the nipple, concerning for intraductal extension. The posterior extent of the calcifications seen mammographically is not well seen on ultrasound. At 12:30, 5 cm from the nipple, there is an oval, hypoechoic, circumscribed mass measuring approximately 8 x 8 x 2  mm which is thought to correspond to the largest of the oval low-density masses seen mammographically at 12 o'clock. No internal vascularity is identified.  Targeted exam of the left axilla demonstrates an abnormal appearing lymph node with cortical thickening measuring up to 5 mm.    IMPRESSION: 1. Highly suspicious left breast mass with associated pleomorphic calcifications which together span approximately 5.5 cm. Sonographic findings are concerning for intraductal extension of carcinoma to the level of the nipple. 2. Indeterminate left axillary lymph node with cortical thickening. 3. Circumscribed, low-density oval masses at 12 o'clock within the left breast.  RECOMMENDATION: 1. Ultrasound-guided core biopsy of the left breast mass at 930 and of the left axillary lymph node with cortical thickening. If biopsy of the left breast mass at 930 demonstrates malignancy, breast MRI is recommended for further evaluation of nipple involvement. 2. The patient's outside prior mammograms will be obtained for comparison. If the low density oval masses seen within the left breast at 12 o'clock are unchanged in appearance, no further evaluation is recommended. An addendum will be made when the comparison films are available.  I have discussed the findings and recommendations with the patient. Results were also provided in writing at the conclusion of the visit. If applicable, a reminder letter will be sent to the patient regarding the next appointment.  BI-RADS CATEGORY  5: Highly suggestive of malignancy.   Electronically Signed   By:  Pamelia Hoit M.D.   On: 08/06/2014 12:16   Mm Diag Breast Tomo Uni Left  08/06/2014   CLINICAL DATA:  59 year old female, callback from screening mammogram for possible left breast mass  EXAM: DIGITAL DIAGNOSTIC LEFT MAMMOGRAM WITH 3D TOMOSYNTHESIS WITH CAD  ULTRASOUND LEFT BREAST  COMPARISON:  Screening exam dated 07/21/2014  ACR Breast Density Category b: There are scattered areas of fibroglandular density.  FINDINGS: CC and MLO views of the left breast with tomosynthesis and CC and ML magnification views were performed. There is an approximately 2 cm irregular mass within the slightly medial, subareolar left breast. In addition, there are associated coarse heterogeneous calcifications. In total, the mass and calcifications span approximately 5.5 cm. A prominent lymph node is noted within the left axilla. Several adjacent oval, circumscribed low-density masses are noted within the left breast at 12 o'clock, middle depth.  Mammographic images were processed with CAD.  On physical exam, a palpable mass is noted within the left breast at 930, 1 cm from the nipple. No discrete mass is felt in the 12 o'clock region.  Targeted ultrasound is performed, showing an irregular hypoechoic mass at 930, 1 cm from the nipple measuring approximately 15 x 13 x 14 mm, corresponding to the palpable abnormality on exam. Dilated ducts extend from the mass to the level of the nipple, concerning for intraductal extension. The posterior extent of the calcifications seen mammographically is not well seen on ultrasound. At 12:30, 5 cm from the nipple, there is an oval, hypoechoic, circumscribed mass measuring approximately 8 x 8 x 2 mm which is thought to correspond to the largest of the oval low-density masses seen mammographically at 12 o'clock. No internal vascularity is identified.  Targeted exam of the left axilla demonstrates an abnormal appearing lymph node with cortical thickening measuring up to 5 mm.  IMPRESSION: 1. Highly  suspicious left breast mass with associated pleomorphic calcifications which together span approximately 5.5 cm. Sonographic findings are concerning for intraductal extension of carcinoma to the level of the nipple. 2. Indeterminate left axillary lymph node  with cortical thickening. 3. Circumscribed, low-density oval masses at 12 o'clock within the left breast.  RECOMMENDATION: 1. Ultrasound-guided core biopsy of the left breast mass at 930 and of the left axillary lymph node with cortical thickening. If biopsy of the left breast mass at 930 demonstrates malignancy, breast MRI is recommended for further evaluation of nipple involvement. 2. The patient's outside prior mammograms will be obtained for comparison. If the low density oval masses seen within the left breast at 12 o'clock are unchanged in appearance, no further evaluation is recommended. An addendum will be made when the comparison films are available.  I have discussed the findings and recommendations with the patient. Results were also provided in writing at the conclusion of the visit. If applicable, a reminder letter will be sent to the patient regarding the next appointment.  BI-RADS CATEGORY  5: Highly suggestive of malignancy.   Electronically Signed   By: Pamelia Hoit M.D.   On: 08/06/2014 12:16   [place] Lt Breast Bx W Loc Dev 1st Lesion Img Bx Spec US Guide  08/06/2014   CLINICAL DATA:  59 year old female for ultrasound-guided biopsy of a suspicious left breast mass at 930, 1 cm from the nipple  EXAM: ULTRASOUND GUIDED LEFT BREAST CORE NEEDLE BIOPSY  COMPARISON:  Previous exam(s).  FINDINGS: I met with the patient and we discussed the procedure of ultrasound-guided biopsy, including benefits and alternatives. We discussed the high likelihood of a successful procedure. We discussed the risks of the procedure, including infection, bleeding, tissue injury, clip migration, and inadequate sampling. Informed written consent was given. The usual time-out  protocol was performed immediately prior to the procedure.  Using sterile technique and 2% Lidocaine as local anesthetic, under direct ultrasound visualization, a 14 gauge spring-loaded device was used to perform biopsy of a suspicious left breast mass at 930 using a medial to lateral approach. At the conclusion of the procedure a ribbon shaped tissue marker clip was deployed into the biopsy cavity. Follow up 2 view mammogram was performed and dictated separately.  IMPRESSION: Ultrasound guided biopsy of a suspicious left breast mass at 930. No apparent complications.   Electronically Signed   By: Pamelia Hoit M.D.   On: 08/06/2014 12:17   [place] Lt Breast Bx W Loc Dev Ea Add Lesion Img Bx Spec US Guide  08/06/2014   CLINICAL DATA:  59 year old female for ultrasound-guided biopsy of a left axillary lymph node with cortical thickening  EXAM: ULTRASOUND GUIDED CORE NEEDLE BIOPSY OF A LEFT AXILLARY NODE  COMPARISON:  Previous exam(s).  FINDINGS: I met with the patient and we discussed the procedure of ultrasound-guided biopsy, including benefits and alternatives. We discussed the high likelihood of a successful procedure. We discussed the risks of the procedure, including infection, bleeding, tissue injury, clip migration, and inadequate sampling. Informed written consent was given. The usual time-out protocol was performed immediately prior to the procedure.  Using sterile technique and 2% Lidocaine as local anesthetic, under direct ultrasound visualization, a 14 gauge spring-loaded device was used to perform biopsy of a left axillary lymph node with cortical thickening using a lateral to medial approach. At the conclusion of the procedure a spiral shaped tissue marker clip was deployed into the biopsy cavity. Follow up 2 view mammogram was performed and dictated separately.  IMPRESSION: Ultrasound guided biopsy of a left axillary lymph node. No apparent complications.   Electronically Signed   By: Pamelia Hoit M.D.   On:  08/06/2014 12:18    ASSESSMENT &  PLAN:  59 year old African-American female, postmenopausal, healthy and fit, who was found to have a left breast mass and a biopsy confirmed node metastasis by screening mammogram.  1. Left breast invasive ductal carcinoma, cT3N1Mx, stage IIIA, ER+/PR+/HER2 pending -I reviewed her mammograms, breast ultrasound findings and core needle biopsy results. -Giving the probable stage III disease, I'll obtain a CT chest, abdomen and pelvis, and bone scan for staging and to ruled out distant metastasis. -If there are no distant metastasis, I recommend neoadjuvant chemotherapy. Giving the large primary tumor, node positive disease, high Ki-67, she has very high risk of cancer recurrence after surgery. -I called the pathology lab today, her HER-2 result is still pending, likely will be reported in next 2 days. If HER-2 negative, I recommend dose dense Adriamycin and Cytoxan every 2 weeks for fourth cycle, followed by weekly Taxol for 12 weeks. If HER-2 positive, I'll likely recommend TCHP every 3 weeks for a total of 6 cycles. -I'll obtain breast MRI with and without contrast before and after new adjuvant chemotherapy for response evaluation. -Giving the strong ER/PR positivity, I would recommend adjuvant endocrine therapy with aromatase inhibitor after her surgery and radiation. -Giving her positive lymph nodes, she would likely need adjuvant radiation, we'll refer her.  -She had non-STEMI about 5 months ago, I would obtain a repeat echo before chemotherapy. -I'll see her back before her first cycle chemotherapy.    2. Cancer genetics She has very strong family history of breast, all in and pancreatic cancer, or in first-degree, I recommend her to see genetic counselor to ruled out inheritable breast cancer syndrome. She agrees with the plan, I'll set up her referral.   3. History of non-STEMI in 03/2014 -She had cardiac catheterization, which showed mild diffuse coronary  artery disease, but no need intervention.  -She will follow-up with her cardiologist Dr. Burt Knack -We discussed some of the chemotherapy drug may cause heart failure, we'll repeat her apical, she may need follow-up with Dr. Burt Knack during her chemotherapy.    Plan -Genetic consulting  -CT CAP, bone scan, breast MRI and echo -port placement by Dr. Marlou Starks on 8/26 -chemo class  -I will see her back on her first cycle chemotherapy on August 29   All questions were answered. The patient knows to call the clinic with any problems, questions or concerns. I spent 55 minutes counseling the patient face to face. The total time spent in the appointment was 60 minutes and more than 50% was on counseling.     Truitt Merle, MD 08/17/2014 6:23 PM

## 2014-08-17 ENCOUNTER — Ambulatory Visit: Payer: Federal, State, Local not specified - PPO

## 2014-08-17 ENCOUNTER — Ambulatory Visit (HOSPITAL_BASED_OUTPATIENT_CLINIC_OR_DEPARTMENT_OTHER): Payer: Federal, State, Local not specified - PPO | Admitting: Hematology

## 2014-08-17 ENCOUNTER — Encounter: Payer: Self-pay | Admitting: Hematology

## 2014-08-17 VITALS — BP 140/99 | HR 75 | Temp 98.7°F | Resp 18 | Ht 67.0 in | Wt 198.8 lb

## 2014-08-17 DIAGNOSIS — Z170 Estrogen receptor positive status [ER+]: Secondary | ICD-10-CM | POA: Diagnosis not present

## 2014-08-17 DIAGNOSIS — Z803 Family history of malignant neoplasm of breast: Secondary | ICD-10-CM | POA: Diagnosis not present

## 2014-08-17 DIAGNOSIS — C773 Secondary and unspecified malignant neoplasm of axilla and upper limb lymph nodes: Secondary | ICD-10-CM

## 2014-08-17 DIAGNOSIS — C50212 Malignant neoplasm of upper-inner quadrant of left female breast: Secondary | ICD-10-CM

## 2014-08-17 NOTE — Progress Notes (Signed)
Left pt's FMLA forms in managed care Raquel's inbox.

## 2014-08-17 NOTE — Telephone Encounter (Signed)
Fax placed in nurse fax box 

## 2014-08-17 NOTE — Progress Notes (Signed)
CHECKED IN NEW PATIENT WITH NO FINANCIAL CONCERNS AT THIS TIME.PT HAS MY CARD IF NEEDED FOR ANY QUESTIONS OR FINANCIAL CONCERNS.BREAST PACKET GIVEN.

## 2014-08-18 ENCOUNTER — Telehealth: Payer: Self-pay | Admitting: Hematology

## 2014-08-18 NOTE — Telephone Encounter (Signed)
sent Vaughan Basta a email to pre-cert for [facility] pt will call & sch after reply

## 2014-08-18 NOTE — Telephone Encounter (Signed)
per pof to sch pt appt-pt cld and I gave pt # to [facility] to sch MRI-adv [facility] will be calling ot sch scans-adv need to come ger contrast-gave appt times & dates

## 2014-08-18 NOTE — Telephone Encounter (Signed)
per pof to sch pt ECHO-per reply from Vaughan Basta D NPR-cld & left pt a message of time/date/location

## 2014-08-18 NOTE — Telephone Encounter (Signed)
pt picked up contrast and copy of sch

## 2014-08-20 ENCOUNTER — Encounter: Payer: Self-pay | Admitting: *Deleted

## 2014-08-21 ENCOUNTER — Other Ambulatory Visit: Payer: Federal, State, Local not specified - PPO

## 2014-08-24 ENCOUNTER — Ambulatory Visit
Admission: RE | Admit: 2014-08-24 | Discharge: 2014-08-24 | Disposition: A | Discharge status: Home / Self Care | Payer: Federal, State, Local not specified - PPO | Source: Ambulatory Visit | Attending: Hematology | Admitting: Hematology

## 2014-08-24 ENCOUNTER — Encounter (INDEPENDENT_AMBULATORY_CARE_PROVIDER_SITE_OTHER): Payer: Self-pay

## 2014-08-24 ENCOUNTER — Ambulatory Visit (HOSPITAL_COMMUNITY)
Admission: RE | Admit: 2014-08-24 | Discharge: 2014-08-24 | Disposition: A | Discharge status: Home / Self Care | Payer: Federal, State, Local not specified - PPO | Source: Ambulatory Visit | Attending: Hematology | Admitting: Hematology

## 2014-08-24 ENCOUNTER — Other Ambulatory Visit: Payer: Federal, State, Local not specified - PPO

## 2014-08-24 DIAGNOSIS — C50212 Malignant neoplasm of upper-inner quadrant of left female breast: Secondary | ICD-10-CM | POA: Insufficient documentation

## 2014-08-24 MED ORDER — GADOBENATE DIMEGLUMINE 529 MG/ML IV SOLN
18.0000 mL | Freq: Once | INTRAVENOUS | Status: AC | PRN
  Administered 2014-08-24: 18 mL via INTRAVENOUS

## 2014-08-24 NOTE — Progress Notes (Signed)
Echocardiogram 2D Echocardiogram has been performed.  Mary Randall 08/24/2014, 1:47 PM

## 2014-08-25 ENCOUNTER — Encounter (HOSPITAL_COMMUNITY)
Admission: RE | Admit: 2014-08-25 | Discharge: 2014-08-25 | Disposition: A | Discharge status: Home / Self Care | Payer: Federal, State, Local not specified - PPO | Source: Ambulatory Visit | Attending: General Surgery | Admitting: General Surgery

## 2014-08-25 ENCOUNTER — Encounter (HOSPITAL_COMMUNITY): Payer: Self-pay

## 2014-08-25 ENCOUNTER — Other Ambulatory Visit: Payer: Self-pay | Admitting: General Surgery

## 2014-08-25 DIAGNOSIS — C50212 Malignant neoplasm of upper-inner quadrant of left female breast: Secondary | ICD-10-CM | POA: Diagnosis present

## 2014-08-25 DIAGNOSIS — Z800 Family history of malignant neoplasm of digestive organs: Secondary | ICD-10-CM | POA: Diagnosis not present

## 2014-08-25 DIAGNOSIS — C50912 Malignant neoplasm of unspecified site of left female breast: Secondary | ICD-10-CM

## 2014-08-25 DIAGNOSIS — I2510 Atherosclerotic heart disease of native coronary artery without angina pectoris: Secondary | ICD-10-CM | POA: Diagnosis not present

## 2014-08-25 DIAGNOSIS — Z803 Family history of malignant neoplasm of breast: Secondary | ICD-10-CM | POA: Diagnosis not present

## 2014-08-25 DIAGNOSIS — Z79899 Other long term (current) drug therapy: Secondary | ICD-10-CM | POA: Diagnosis not present

## 2014-08-25 DIAGNOSIS — M1990 Unspecified osteoarthritis, unspecified site: Secondary | ICD-10-CM | POA: Diagnosis not present

## 2014-08-25 DIAGNOSIS — I252 Old myocardial infarction: Secondary | ICD-10-CM | POA: Diagnosis not present

## 2014-08-25 DIAGNOSIS — I10 Essential (primary) hypertension: Secondary | ICD-10-CM | POA: Diagnosis not present

## 2014-08-25 LAB — CBC
HCT: 38.4 % (ref 36.0–46.0)
Hemoglobin: 12.4 g/dL (ref 12.0–15.0)
MCH: 28.6 pg (ref 26.0–34.0)
MCHC: 32.3 g/dL (ref 30.0–36.0)
MCV: 88.7 fL (ref 78.0–100.0)
PLATELETS: 221 10*3/uL (ref 150–400)
RBC: 4.33 MIL/uL (ref 3.87–5.11)
RDW: 13.6 % (ref 11.5–15.5)
WBC: 5.5 10*3/uL (ref 4.0–10.5)

## 2014-08-25 LAB — BASIC METABOLIC PANEL
Anion gap: 8 (ref 5–15)
BUN: 10 mg/dL (ref 6–20)
CALCIUM: 10.4 mg/dL — AB (ref 8.9–10.3)
CO2: 29 mmol/L (ref 22–32)
CREATININE: 1.05 mg/dL — AB (ref 0.44–1.00)
Chloride: 102 mmol/L (ref 101–111)
GFR calc Af Amer: >60 mL/min (ref >60)
GFR calc non Af Amer: 57 mL/min — ABNORMAL LOW (ref >60)
GLUCOSE: 93 mg/dL (ref 65–99)
Potassium: 3.4 mmol/L — ABNORMAL LOW (ref 3.5–5.1)
Sodium: 139 mmol/L (ref 135–145)

## 2014-08-25 MED ORDER — CHLORHEXIDINE GLUCONATE 4 % EX LIQD: 1.0000 "application " | Freq: Once | CUTANEOUS | Status: DC

## 2014-08-25 NOTE — Pre-Procedure Instructions (Signed)
    TAKIAH MAIDEN  08/25/2014      [id number] [address], [place] - [place] [address] [place] [address] Phone: [phone] Fax: [phone]  [phone] [place], [place] - [place] [address] Phone: [phone] Fax: [phone]    Your procedure is scheduled on Friday, August 26th, 2016  Report to [facility] Admitting at 8:00 A.M.  Call this number if you have problems the morning of surgery:  [phone]   Remember:  Do not eat food or drink liquids after midnight.   Take these medicines the morning of surgery with A SIP OF WATER: Amlodipine (Norvasc), Isosorbide Mononitrate (Imdur).  Stop taking: NSAIDS, Aspirin, Aleve, Naproxen, Ibuprofen, BC's, Goody's, Motrin, Advil, Fish Oil, all herbal medications, and all vitamins.    Do not wear jewelry, make-up or nail polish.  Do not wear lotions, powders, or perfumes.  You may NOT wear deodorant.  Do not shave 48 hours prior to surgery.    Do not bring valuables to the hospital.  [facility] is not responsible for any belongings or valuables.  Contacts, dentures or bridgework may not be worn into surgery.  Leave your suitcase in the car.  After surgery it may be brought to your room.  For patients admitted to the hospital, discharge time will be determined by your treatment team.  Patients discharged the day of surgery will not be allowed to drive home.   Special instructions:  See attached.   Please read over the following fact sheets that you were given. Pain Booklet, Coughing and Deep Breathing and Surgical Site Infection Prevention

## 2014-08-25 NOTE — Progress Notes (Signed)
PCP - Dr. Suzanna Obey Cardiologist - Dr. Burt Knack  EKG- March 2016 - Epic CXR - denies Echo- 08/2014 - Epic Stress test - Denies Cardiac Cath - 03/2014 - Epic  Pt. Denies shortness of breath and chest pain at PAT appointment.

## 2014-08-25 NOTE — Addendum Note (Signed)
Addended by: Luella Cook III on: 08/25/2014 02:34 PM   Modules accepted: Orders

## 2014-08-26 ENCOUNTER — Telehealth: Payer: Self-pay | Admitting: *Deleted

## 2014-08-26 ENCOUNTER — Encounter: Payer: Self-pay | Admitting: Hematology

## 2014-08-26 NOTE — Telephone Encounter (Signed)
Pt. Called.  She needs letter for trip cancellation that she discussed with Dr. Burr Medico ASAP.  She would like it by tomorrow am and it can be handwritten as long as it is on her letterhead.  Please call her at [phone].

## 2014-08-26 NOTE — Progress Notes (Signed)
Anesthesia Chart Review:  Pt is 59 year old female scheduled for insertion of port-a-cath on 08/28/2014 with Dr. Marlou Starks.   Cardiologist is Dr. Burt Knack.   PMH includes: NSTEMI (03/2014), Prinzmetal's angina, HTN, breast cancer. Never smoker. BMI 31.  Medications include: amlodipine, ASA, lipitor, hctz, irbesartan, imdur, potassium.   Preoperative labs reviewed.    EKG 03/27/2014: NSR. ST & T wave abnormality, consider anterolateral ischemia. Unchanged per L. Ingold, NP's interpretation.   Echo 08/24/2014:  - Left ventricle: The cavity size was normal. Wall thickness was normal. Systolic function was normal. The estimated ejection fraction was in the range of 55% to 60%. - Left atrium: The atrium was mildly dilated.  Cardiac cath 03/16/2014: 1. 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 Normal LV systolic function with normal LVEDP --Recommendations: Somewhat unclear as to the etiology of this patient's fairly profound symptoms and EKG changes. 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 The patient had previously been on long-acting nitrates and ran out of medication recently. It is also possible that she has some sort of GI pathology but difficult to explain her EKG changes. We'll start back on long-acting nitrate therapy.   Pt has cardiac clearance from Dr. Burt Knack in [place] telephone encounter dated 08/16/2014.   If no changes, I anticipate pt can proceed with surgery as scheduled.

## 2014-08-26 NOTE — Telephone Encounter (Signed)
Please let her know the letter is ready for her.  Mary Randall  08/26/2014

## 2014-08-26 NOTE — Telephone Encounter (Signed)
Spoke with pt and informed pt that a letter is ready for pt to pick up.  Pt stated she would pick up letter in the am  8/25. Letter left with Ms Darryll Capers at front desk.

## 2014-08-27 MED ORDER — CEFAZOLIN SODIUM-DEXTROSE 2-3 GM-% IV SOLR
2.0000 g | INTRAVENOUS | Status: AC
  Administered 2014-08-28: 2 g via INTRAVENOUS
  Filled 2014-08-27: qty 50

## 2014-08-28 ENCOUNTER — Ambulatory Visit (HOSPITAL_COMMUNITY): Payer: Federal, State, Local not specified - PPO

## 2014-08-28 ENCOUNTER — Encounter (HOSPITAL_COMMUNITY)
Admission: RE | Disposition: A | Discharge status: Home / Self Care | Payer: Self-pay | Source: Ambulatory Visit | Attending: General Surgery

## 2014-08-28 ENCOUNTER — Encounter (HOSPITAL_COMMUNITY): Payer: Self-pay | Admitting: Anesthesiology

## 2014-08-28 ENCOUNTER — Ambulatory Visit (HOSPITAL_COMMUNITY)
Admission: RE | Admit: 2014-08-28 | Discharge: 2014-08-28 | Disposition: A | Discharge status: Home / Self Care | Payer: Federal, State, Local not specified - PPO | Source: Ambulatory Visit | Attending: General Surgery | Admitting: General Surgery

## 2014-08-28 ENCOUNTER — Ambulatory Visit (HOSPITAL_COMMUNITY): Payer: Federal, State, Local not specified - PPO | Admitting: Emergency Medicine

## 2014-08-28 ENCOUNTER — Ambulatory Visit (HOSPITAL_COMMUNITY): Payer: Federal, State, Local not specified - PPO | Admitting: Anesthesiology

## 2014-08-28 DIAGNOSIS — Z79899 Other long term (current) drug therapy: Secondary | ICD-10-CM | POA: Insufficient documentation

## 2014-08-28 DIAGNOSIS — Z95828 Presence of other vascular implants and grafts: Secondary | ICD-10-CM

## 2014-08-28 DIAGNOSIS — I2510 Atherosclerotic heart disease of native coronary artery without angina pectoris: Secondary | ICD-10-CM | POA: Insufficient documentation

## 2014-08-28 DIAGNOSIS — M1990 Unspecified osteoarthritis, unspecified site: Secondary | ICD-10-CM | POA: Insufficient documentation

## 2014-08-28 DIAGNOSIS — C50212 Malignant neoplasm of upper-inner quadrant of left female breast: Secondary | ICD-10-CM | POA: Diagnosis not present

## 2014-08-28 DIAGNOSIS — Z803 Family history of malignant neoplasm of breast: Secondary | ICD-10-CM | POA: Insufficient documentation

## 2014-08-28 DIAGNOSIS — C50912 Malignant neoplasm of unspecified site of left female breast: Secondary | ICD-10-CM

## 2014-08-28 DIAGNOSIS — I252 Old myocardial infarction: Secondary | ICD-10-CM | POA: Insufficient documentation

## 2014-08-28 DIAGNOSIS — I10 Essential (primary) hypertension: Secondary | ICD-10-CM | POA: Insufficient documentation

## 2014-08-28 DIAGNOSIS — Z800 Family history of malignant neoplasm of digestive organs: Secondary | ICD-10-CM | POA: Insufficient documentation

## 2014-08-28 HISTORY — PX: PORTACATH PLACEMENT: SHX2246

## 2014-08-28 SURGERY — INSERTION, TUNNELED CENTRAL VENOUS DEVICE, WITH PORT
Anesthesia: General

## 2014-08-28 MED ORDER — HEPARIN SOD (PORK) LOCK FLUSH 100 UNIT/ML IV SOLN
INTRAVENOUS | Status: DC | PRN
  Administered 2014-08-28: 500 [IU]

## 2014-08-28 MED ORDER — PROPOFOL 10 MG/ML IV BOLUS
INTRAVENOUS | Status: AC
  Filled 2014-08-28: qty 20

## 2014-08-28 MED ORDER — LIDOCAINE HCL (CARDIAC) 20 MG/ML IV SOLN
INTRAVENOUS | Status: DC | PRN
  Administered 2014-08-28: 60 mg via INTRAVENOUS

## 2014-08-28 MED ORDER — SODIUM CHLORIDE 0.9 % IR SOLN
Status: DC | PRN
  Administered 2014-08-28: 500 mL

## 2014-08-28 MED ORDER — 0.9 % SODIUM CHLORIDE (POUR BTL) OPTIME
TOPICAL | Status: DC | PRN
  Administered 2014-08-28: 1000 mL

## 2014-08-28 MED ORDER — LACTATED RINGERS IV SOLN
INTRAVENOUS | Status: DC | PRN
  Administered 2014-08-28: 10:00:00 via INTRAVENOUS

## 2014-08-28 MED ORDER — BUPIVACAINE HCL (PF) 0.25 % IJ SOLN
INTRAMUSCULAR | Status: AC
  Filled 2014-08-28: qty 30

## 2014-08-28 MED ORDER — PHENYLEPHRINE HCL 10 MG/ML IJ SOLN
INTRAMUSCULAR | Status: DC | PRN
  Administered 2014-08-28 (×5): 80 ug via INTRAVENOUS

## 2014-08-28 MED ORDER — PROPOFOL 10 MG/ML IV BOLUS
INTRAVENOUS | Status: DC | PRN
  Administered 2014-08-28: 150 mg via INTRAVENOUS
  Administered 2014-08-28: 50 mg via INTRAVENOUS

## 2014-08-28 MED ORDER — BUPIVACAINE HCL (PF) 0.25 % IJ SOLN
INTRAMUSCULAR | Status: DC | PRN
  Administered 2014-08-28: 10 mL

## 2014-08-28 MED ORDER — MIDAZOLAM HCL 5 MG/5ML IJ SOLN
INTRAMUSCULAR | Status: DC | PRN
  Administered 2014-08-28 (×2): 1 mg via INTRAVENOUS

## 2014-08-28 MED ORDER — HEPARIN SOD (PORK) LOCK FLUSH 100 UNIT/ML IV SOLN
INTRAVENOUS | Status: AC
  Filled 2014-08-28: qty 5

## 2014-08-28 MED ORDER — MIDAZOLAM HCL 2 MG/2ML IJ SOLN
INTRAMUSCULAR | Status: AC
  Filled 2014-08-28: qty 4

## 2014-08-28 MED ORDER — FENTANYL CITRATE (PF) 100 MCG/2ML IJ SOLN
INTRAMUSCULAR | Status: AC
  Filled 2014-08-28: qty 2

## 2014-08-28 MED ORDER — ONDANSETRON HCL 4 MG/2ML IJ SOLN
INTRAMUSCULAR | Status: DC | PRN
  Administered 2014-08-28: 4 mg via INTRAVENOUS

## 2014-08-28 MED ORDER — OXYCODONE-ACETAMINOPHEN 5-325 MG PO TABS: 1.0000 | ORAL_TABLET | ORAL | Status: DC | PRN

## 2014-08-28 MED ORDER — FENTANYL CITRATE (PF) 100 MCG/2ML IJ SOLN
INTRAMUSCULAR | Status: DC | PRN
  Administered 2014-08-28 (×2): 25 ug via INTRAVENOUS

## 2014-08-28 MED ORDER — FENTANYL CITRATE (PF) 100 MCG/2ML IJ SOLN
25.0000 ug | INTRAMUSCULAR | Status: DC | PRN
  Administered 2014-08-28: 50 ug via INTRAVENOUS

## 2014-08-28 MED ORDER — FENTANYL CITRATE (PF) 250 MCG/5ML IJ SOLN
INTRAMUSCULAR | Status: AC
  Filled 2014-08-28: qty 5

## 2014-08-28 MED ORDER — LIDOCAINE HCL (CARDIAC) 20 MG/ML IV SOLN
INTRAVENOUS | Status: AC
  Filled 2014-08-28: qty 5

## 2014-08-28 MED ORDER — LACTATED RINGERS IV SOLN
INTRAVENOUS | Status: DC
  Administered 2014-08-28: 10:00:00 via INTRAVENOUS

## 2014-08-28 MED ORDER — DEXAMETHASONE SODIUM PHOSPHATE 4 MG/ML IJ SOLN
INTRAMUSCULAR | Status: AC
  Filled 2014-08-28: qty 2

## 2014-08-28 MED ORDER — ONDANSETRON HCL 4 MG/2ML IJ SOLN
INTRAMUSCULAR | Status: AC
  Filled 2014-08-28: qty 2

## 2014-08-28 SURGICAL SUPPLY — 57 items
BAG DECANTER FOR FLEXI CONT (MISCELLANEOUS) ×2 IMPLANT
BLADE SURG 15 STRL LF DISP TIS (BLADE) ×1 IMPLANT
BLADE SURG 15 STRL SS (BLADE) ×1
CHLORAPREP W/TINT 10.5 ML (MISCELLANEOUS) IMPLANT
CHLORAPREP W/TINT 26ML (MISCELLANEOUS) ×2 IMPLANT
COVER SURGICAL LIGHT HANDLE (MISCELLANEOUS) ×2 IMPLANT
COVER TRANSDUCER ULTRASND GEL (DRAPE) IMPLANT
CRADLE DONUT ADULT HEAD (MISCELLANEOUS) ×2 IMPLANT
DECANTER SPIKE VIAL GLASS SM (MISCELLANEOUS) ×2 IMPLANT
DERMABOND ADVANCED (GAUZE/BANDAGES/DRESSINGS) ×1
DERMABOND ADVANCED .7 DNX12 (GAUZE/BANDAGES/DRESSINGS) ×1 IMPLANT
DRAPE C-ARM 42X72 X-RAY (DRAPES) ×2 IMPLANT
DRAPE CHEST BREAST 15X10 FENES (DRAPES) ×2 IMPLANT
DRAPE UTILITY XL STRL (DRAPES) ×2 IMPLANT
ELECT CAUTERY BLADE 6.4 (BLADE) ×2 IMPLANT
ELECT REM PT RETURN 9FT ADLT (ELECTROSURGICAL) ×2
ELECTRODE REM PT RTRN 9FT ADLT (ELECTROSURGICAL) ×1 IMPLANT
GAUZE SPONGE 4X4 16PLY XRAY LF (GAUZE/BANDAGES/DRESSINGS) ×2 IMPLANT
GEL ULTRASOUND 20GR AQUASONIC (MISCELLANEOUS) IMPLANT
GLOVE BIO SURGEON STRL SZ7.5 (GLOVE) ×2 IMPLANT
GLOVE BIO SURGEON STRL SZ8 (GLOVE) ×2 IMPLANT
GLOVE BIOGEL PI IND STRL 6.5 (GLOVE) ×1 IMPLANT
GLOVE BIOGEL PI IND STRL 8.5 (GLOVE) ×1 IMPLANT
GLOVE BIOGEL PI INDICATOR 6.5 (GLOVE) ×1
GLOVE BIOGEL PI INDICATOR 8.5 (GLOVE) ×1
GLOVE SURG SS PI 6.5 STRL IVOR (GLOVE) ×2 IMPLANT
GLOVE SURG SS PI 7.5 STRL IVOR (GLOVE) ×2 IMPLANT
GOWN STRL REUS W/ TWL LRG LVL3 (GOWN DISPOSABLE) ×2 IMPLANT
GOWN STRL REUS W/ TWL XL LVL3 (GOWN DISPOSABLE) ×1 IMPLANT
GOWN STRL REUS W/TWL LRG LVL3 (GOWN DISPOSABLE) ×2
GOWN STRL REUS W/TWL XL LVL3 (GOWN DISPOSABLE) ×1
INTRODUCER COOK 11FR (CATHETERS) IMPLANT
KIT BASIN OR (CUSTOM PROCEDURE TRAY) ×2 IMPLANT
KIT PORT POWER 8FR ISP CVUE (Catheter) ×2 IMPLANT
KIT ROOM TURNOVER OR (KITS) ×2 IMPLANT
LIQUID BAND (GAUZE/BANDAGES/DRESSINGS) ×2 IMPLANT
NEEDLE 22X1 1/2 (OR ONLY) (NEEDLE) IMPLANT
NEEDLE HYPO 25GX1X1/2 BEV (NEEDLE) ×2 IMPLANT
NS IRRIG 1000ML POUR BTL (IV SOLUTION) ×2 IMPLANT
PACK SURGICAL SETUP 50X90 (CUSTOM PROCEDURE TRAY) ×2 IMPLANT
PAD ARMBOARD 7.5X6 YLW CONV (MISCELLANEOUS) ×2 IMPLANT
PENCIL BUTTON HOLSTER BLD 10FT (ELECTRODE) ×2 IMPLANT
SET INTRODUCER 12FR PACEMAKER (SHEATH) IMPLANT
SET SHEATH INTRODUCER 10FR (MISCELLANEOUS) IMPLANT
SHEATH COOK PEEL AWAY SET 9F (SHEATH) IMPLANT
SUT MNCRL AB 4-0 PS2 18 (SUTURE) ×2 IMPLANT
SUT PROLENE 2 0 SH 30 (SUTURE) ×4 IMPLANT
SUT SILK 2 0 (SUTURE)
SUT SILK 2-0 18XBRD TIE 12 (SUTURE) IMPLANT
SUT VIC AB 3-0 SH 27 (SUTURE) ×1
SUT VIC AB 3-0 SH 27XBRD (SUTURE) ×1 IMPLANT
SYR 20ML ECCENTRIC (SYRINGE) ×4 IMPLANT
SYR 5ML LUER SLIP (SYRINGE) ×2 IMPLANT
SYR CONTROL 10ML LL (SYRINGE) ×2 IMPLANT
SYRINGE 10CC LL (SYRINGE) IMPLANT
TOWEL OR 17X24 6PK STRL BLUE (TOWEL DISPOSABLE) ×2 IMPLANT
TOWEL OR 17X26 10 PK STRL BLUE (TOWEL DISPOSABLE) IMPLANT

## 2014-08-28 NOTE — Transfer of Care (Signed)
Immediate Anesthesia Transfer of Care Note  Patient: Mary Randall  Procedure(s) Performed: Procedure(s): INSERTION PORT-A-CATH (N/A)  Patient Location: PACU  Anesthesia Type:General  Level of Consciousness: awake, alert  and oriented  Airway & Oxygen Therapy: Patient connected to face mask oxygen  Post-op Assessment: Report given to RN  Post vital signs: stable  Last Vitals:  Filed Vitals:   08/28/14 0823  Pulse: 75  Temp: 36.9 C  Resp: 18    Complications: No apparent anesthesia complications

## 2014-08-28 NOTE — Anesthesia Postprocedure Evaluation (Signed)
  Anesthesia Post-op Note  Patient: Mary Randall  Procedure(s) Performed: Procedure(s): INSERTION PORT-A-CATH (N/A)  Patient Location: PACU  Anesthesia Type:General  Level of Consciousness: awake and alert   Airway and Oxygen Therapy: Patient Spontanous Breathing  Post-op Pain: Controlled  Post-op Assessment: Post-op Vital signs reviewed, Patient's Cardiovascular Status Stable and Respiratory Function Stable  Post-op Vital Signs: Reviewed  Filed Vitals:   08/28/14 1200  BP: 129/92  Pulse: 71  Temp:   Resp: 24    Complications: No apparent anesthesia complications

## 2014-08-28 NOTE — Op Note (Signed)
08/28/2014  4:27 PM  PATIENT:  Mary Randall  59 y.o. female  PRE-OPERATIVE DIAGNOSIS:  LEFT BREAST CANCER  POST-OPERATIVE DIAGNOSIS:  LEFT BREAST CANCER  PROCEDURE:  Procedure(s): INSERTION PORT-A-CATH (N/A)  SURGEON:  Surgeon(s) and Role:    * Jovita Kussmaul, MD - Primary  PHYSICIAN ASSISTANT:   ASSISTANTS: none   ANESTHESIA:   general  EBL:  Total I/O In: 800 [I.V.:800] Out: -   BLOOD ADMINISTERED:none  DRAINS: none   LOCAL MEDICATIONS USED:  MARCAINE     SPECIMEN:  No Specimen  DISPOSITION OF SPECIMEN:  N/A  COUNTS:  YES  TOURNIQUET:  * No tourniquets in log *  DICTATION: .Dragon Dictation   After informed consent was obtained the patient was brought to the operating room and placed in the supine position on the operating room table. After adequate induction of general anesthesia a roll was placed between the patient's shoulder blades to extend the shoulder slightly. The patient's right chest and neck area were prepped with ChloraPrep, allowed to dry, and draped in usual sterile manner. The patient was placed in Trendelenburg position. The area lateral to the bend of the clavicle and the right chest was infiltrated with quarter percent Marcaine. A small incision was made with a 15 blade knife. A large bore needle from the Port-A-Cath kit was used to slide beneath the bend of the clavicle on the right chest heading towards the sternal margin and in doing so I was able to access the right subclavian vein without difficulty. A wire was fed through the needle using the Seldinger technique without difficulty. The wire was confirmed in the central venous system using real-time fluoroscopy. Next the incision was extended slightly on the right chest. A subcutaneous pocket was created inferior to the incision by blunt finger dissection and some sharp dissection with the electrocautery. The tubing was then placed on the reservoir. The reservoir was placed in the subcutaneous pocket  and the length of the tubing was estimated using real-time fluoroscopy. The tubing was cut to the appropriate length. Next the sheath and dilator were fed over the wire also using the Seldinger technique without difficulty. The dilator and wire were removed. The tubing was fed through the sheath as far as it would go and then held in place while the sheath was gently cracked and separated. The tip of the catheter was then evaluated and found to be in the superior vena cava. The tubing was then permanently anchored to the reservoir. The reservoir was anchored in the subcutaneous pocket with 2 2-0 Prolene stitches. The port was then aspirated and it aspirated blood easily. It was then flushed initially with a dilute heparin solution and then with a more concentrated heparin solution. The subcutaneous tissue was closed over the port with interrupted 3-0 Vicryl stitches. The skin was then closed with a running 4-0 Monocryl subcuticular stitch. Dermabond dressings were applied. The patient tolerated the procedure well. At the end of the case all needle sponge and instrument counts were correct. The patient was then awakened and taken to recovery in stable condition.  PLAN OF CARE: Discharge to home after PACU  PATIENT DISPOSITION:  PACU - hemodynamically stable.   Delay start of Pharmacological VTE agent (>24hrs) due to surgical blood loss or risk of bleeding: not applicable

## 2014-08-28 NOTE — Anesthesia Procedure Notes (Signed)
Procedure Name: LMA Insertion Date/Time: 08/28/2014 10:33 AM Performed by: Susa Loffler Pre-anesthesia Checklist: Patient identified, Timeout performed, Emergency Drugs available, Suction available and Patient being monitored Patient Re-evaluated:Patient Re-evaluated prior to inductionOxygen Delivery Method: Circle system utilized Preoxygenation: Pre-oxygenation with 100% oxygen Intubation Type: IV induction LMA: LMA inserted LMA Size: 4.0 Number of attempts: 1 Placement Confirmation: positive ETCO2 and breath sounds checked- equal and bilateral Tube secured with: Tape Dental Injury: Teeth and Oropharynx as per pre-operative assessment

## 2014-08-28 NOTE — H&P (Signed)
Mary Randall 08/12/2014 2:27 PM Location: [facility] Patient #: [id number] DOB: December 19, 1955 Separated / Language: Mary Randall / Race: Black or African American Female  History of Present Illness Mary Randall S. Marlou Starks MD; 08/12/2014 3:53 PM) Patient words: eval breast.  The patient is a 59 year old female who presents with breast cancer. We are asked to see the patient in consultation by Dr. Pamelia Randall to evaluate her for a new left breast cancer. The patient is a 59 year old black female who recently went for a routine screening mammogram. That time she was found to have a mass with calcifications in the upper inner aspect of the left breast near the areolar. The combined area involved was approximately 5 cm. 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 All of the tumor markers are still pending. She also had an abnormal appearing lymph node in the left axilla which was also biopsied and came back as breast cancer. She does not take any hormone replacement. She does have a history of breast cancer in a brother, colon cancer in a sister and pancreatic cancer in another brother.   Other Problems Mary Randall, CMA; 08/12/2014 2:27 PM) Arthritis High blood pressure  Past Surgical History Mary Randall, CMA; 08/12/2014 2:27 PM) Breast Biopsy Left. Foot Surgery Bilateral. Oral Surgery  Diagnostic Studies History Mary Randall, CMA; 08/12/2014 2:27 PM) Colonoscopy never Mammogram within last year Pap Smear 1-5 years ago  Allergies Mary Randall, CMA; 08/12/2014 2:28 PM) Codeine Phosphate *ANALGESICS - OPIOID*  Medication History Mary Randall, CMA; 08/12/2014 2:28 PM) Atorvastatin Calcium (40MG  Tablet, Oral) Active. AmLODIPine Besylate (10MG  Tablet, Oral) Active. Hydrochlorothiazide (25MG  Tablet, Oral) Active. Isosorbide Mononitrate ER (30MG  Tablet ER 24HR, Oral) Active. Irbesartan (300MG  Tablet, Oral) Active. Potassium Chloride ER (10MEQ Tablet ER, Oral)  Active. Nitrostat (0.4MG  Tab Sublingual, Sublingual) Active. Medications Reconciled  Social History Mary Randall, CMA; 08/12/2014 2:27 PM) Alcohol use Occasional alcohol use. Caffeine use Carbonated beverages, Tea. No drug use Tobacco use Never smoker.  Family History Mary Randall, [place]; 08/12/2014 2:27 PM) Alcohol Abuse Father. Arthritis Family Members In General, Mother. Breast Cancer Brother. Cerebrovascular Accident Father. Colon Cancer Mother. Diabetes Mellitus Mother. Heart Disease Father. Hypertension Family Members In General, Father, Mother. Malignant Neoplasm Of Pancreas Brother.  Pregnancy / Birth History Mary Randall, [place]; 08/12/2014 2:27 PM) Age at menarche 59 years. Gravida 2 Maternal age 9-25 Para 2  Review of Systems (Mary Randall; 08/12/2014 2:27 PM) General Not Present- Appetite Loss, Chills, Fatigue, Fever, Night Sweats, Weight Gain and Weight Loss. Skin Not Present- Change in Wart/Mole, Dryness, Hives, Jaundice, New Lesions, Non-Healing Wounds, Rash and Ulcer. HEENT Present- Wears glasses/contact lenses. Not Present- Earache, Hearing Loss, Hoarseness, Nose Bleed, Oral Ulcers, Ringing in the Ears, Seasonal Allergies, Sinus Pain, Sore Throat, Visual Disturbances and Yellow Eyes. Respiratory Present- Snoring. Not Present- Bloody sputum, Chronic Cough, Difficulty Breathing and Wheezing. Breast Present- Breast Mass. Not Present- Breast Pain, Nipple Discharge and Skin Changes. Cardiovascular Not Present- Chest Pain, Difficulty Breathing Lying Down, Leg Cramps, Palpitations, Rapid Heart Rate, Shortness of Breath and Swelling of Extremities. Gastrointestinal Not Present- Abdominal Pain, Bloating, Bloody Stool, Change in Bowel Habits, Chronic diarrhea, Constipation, Difficulty Swallowing, Excessive gas, Gets full quickly at meals, Hemorrhoids, Indigestion, Nausea, Rectal Pain and Vomiting. Female Genitourinary Not Present- Frequency, Nocturia, Painful  Urination, Pelvic Pain and Urgency. Musculoskeletal Not Present- Back Pain, Joint Pain, Joint Stiffness, Muscle Pain, Muscle Weakness and Swelling of Extremities. Neurological Not Present- Decreased Memory, Fainting, Headaches, Numbness, Seizures,  Tingling, Tremor, Trouble walking and Weakness. Psychiatric Not Present- Anxiety, Bipolar, Change in Sleep Pattern, Depression, Fearful and Frequent crying. Endocrine Not Present- Cold Intolerance, Excessive Hunger, Hair Changes, Heat Intolerance, Hot flashes and New Diabetes. Hematology Not Present- Easy Bruising, Excessive bleeding, Gland problems, HIV and Persistent Infections.   Vitals (Mary Randall CMA; 08/12/2014 2:27 PM) 08/12/2014 2:27 PM Weight: 196 lb Height: 66in Body Surface Area: 2.03 m Body Mass Index: 31.63 kg/m Temp.: 97.67F(Temporal)  Pulse: 81 (Regular)  BP: 132/80 (Sitting, Left Arm, Standard)    Physical Exam Mary Randall S. Marlou Starks MD; 08/12/2014 3:53 PM) General Mental Status-Alert. General Appearance-Consistent with stated age. Hydration-Well hydrated. Voice-Normal.  Head and Neck Head-normocephalic, atraumatic with no lesions or palpable masses. Trachea-midline. Thyroid Gland Characteristics - normal size and consistency.  Eye Eyeball - Bilateral-Extraocular movements intact. Sclera/Conjunctiva - Bilateral-No scleral icterus.  Chest and Lung Exam Chest and lung exam reveals -quiet, even and easy respiratory effort with no use of accessory muscles and on auscultation, normal breath sounds, no adventitious sounds and normal vocal resonance. Inspection Chest Wall - Normal. Back - normal.  Breast Note: There is no palpable mass in either breast. There is no palpable axillary, supraclavicular, or cervical lymphadenopathy.   Cardiovascular Cardiovascular examination reveals -normal heart sounds, regular rate and rhythm with no murmurs and normal pedal pulses  bilaterally.  Abdomen Inspection Inspection of the abdomen reveals - No Hernias. Skin - Scar - no surgical scars. Palpation/Percussion Palpation and Percussion of the abdomen reveal - Soft, Non Tender, No Rebound tenderness, No Rigidity (guarding) and No hepatosplenomegaly. Auscultation Auscultation of the abdomen reveals - Bowel sounds normal.  Neurologic Neurologic evaluation reveals -alert and oriented x 3 with no impairment of recent or remote memory. Mental Status-Normal.  Musculoskeletal Normal Exam - Left-Upper Extremity Strength Normal and Lower Extremity Strength Normal. Normal Exam - Right-Upper Extremity Strength Normal and Lower Extremity Strength Normal.  Lymphatic Head & Neck  General Head & Neck Lymphatics: Bilateral - Description - Normal. Axillary  General Axillary Region: Bilateral - Description - Normal. Tenderness - Non Tender. Femoral & Inguinal  Generalized Femoral & Inguinal Lymphatics: Bilateral - Description - Normal. Tenderness - Non Tender.    Assessment & Plan Mary Randall S. Marlou Starks MD; 08/12/2014 3:54 PM) PRIMARY CANCER OF UPPER INNER QUADRANT OF LEFT FEMALE BREAST (174.2  C50.212) Impression: The patient appears to have a 5 cm cancer in the upper inner left breast with a positive lymph node. I have talked to her in detail about the different options for treatment and at this point I think she should consider neoadjuvant chemotherapy. She would need a Port-A-Cath for this. I have discussed with her in detail the risks and benefits of the operation to place a Port-A-Cath as well as some of the technical aspects and she understands and wishes to proceed. I will also make her referral today to medical oncology to consider neoadjuvant chemotherapy. I will also get cardiac clearance given her history of NSTEMI Current Plans  Follow up in 1 month or as needed Pt Education - Breast Cancer: discussed with patient and provided information. Referred to  Oncology, for evaluation and follow up (Oncology).   Signed by Luella Cook, MD (08/12/2014 3:55 PM)

## 2014-08-28 NOTE — Interval H&P Note (Signed)
History and Physical Interval Note:  08/28/2014 8:36 AM  Mary Randall  has presented today for surgery, with the diagnosis of LEFT BREAST CANCER  The various methods of treatment have been discussed with the patient and family. After consideration of risks, benefits and other options for treatment, the patient has consented to  Procedure(s): INSERTION PORT-A-CATH (N/A) as a surgical intervention .  The patient's history has been reviewed, patient examined, no change in status, stable for surgery.  I have reviewed the patient's chart and labs.  Questions were answered to the patient's satisfaction.     TOTH III,PAUL S

## 2014-08-28 NOTE — Anesthesia Preprocedure Evaluation (Addendum)
Anesthesia Evaluation  Patient identified by MRN, date of birth, ID band Patient awake    Reviewed: Allergy & Precautions, H&P , NPO status , Patient's Chart, lab work & pertinent test results  Airway Mallampati: II  TM Distance: >3 FB Neck ROM: Full    Dental no notable dental hx. (+) Teeth Intact, Dental Advisory Given   Pulmonary neg pulmonary ROS,  breath sounds clear to auscultation  Pulmonary exam normal       Cardiovascular hypertension, Pt. on medications + angina + CAD and + Past MI Rhythm:Regular Rate:Normal  Angina due to coronary spasm   Neuro/Psych negative neurological ROS  negative psych ROS   GI/Hepatic negative GI ROS, Neg liver ROS,   Endo/Other  negative endocrine ROS  Renal/GU negative Renal ROS  negative genitourinary   Musculoskeletal  (+) Arthritis -, Osteoarthritis,    Abdominal   Peds  Hematology negative hematology ROS (+)   Anesthesia Other Findings   Reproductive/Obstetrics negative OB ROS                            Anesthesia Physical Anesthesia Plan  ASA: III  Anesthesia Plan: General   Post-op Pain Management:    Induction: Intravenous  Airway Management Planned: LMA  Additional Equipment:   Intra-op Plan:   Post-operative Plan: Extubation in OR  Informed Consent: I have reviewed the patients History and Physical, chart, labs and discussed the procedure including the risks, benefits and alternatives for the proposed anesthesia with the patient or authorized representative who has indicated his/her understanding and acceptance.   Dental advisory given  Plan Discussed with: CRNA  Anesthesia Plan Comments:         Anesthesia Quick Evaluation

## 2014-08-31 ENCOUNTER — Ambulatory Visit (HOSPITAL_BASED_OUTPATIENT_CLINIC_OR_DEPARTMENT_OTHER): Payer: Federal, State, Local not specified - PPO | Admitting: Hematology

## 2014-08-31 ENCOUNTER — Encounter: Payer: Self-pay | Admitting: Hematology

## 2014-08-31 ENCOUNTER — Encounter (HOSPITAL_COMMUNITY): Payer: Self-pay

## 2014-08-31 ENCOUNTER — Ambulatory Visit (HOSPITAL_COMMUNITY)
Admission: RE | Admit: 2014-08-31 | Discharge: 2014-08-31 | Disposition: A | Discharge status: Home / Self Care | Payer: Federal, State, Local not specified - PPO | Source: Ambulatory Visit | Attending: Hematology | Admitting: Hematology

## 2014-08-31 ENCOUNTER — Encounter (HOSPITAL_COMMUNITY)
Admission: RE | Admit: 2014-08-31 | Discharge: 2014-08-31 | Disposition: A | Discharge status: Home / Self Care | Payer: Federal, State, Local not specified - PPO | Source: Ambulatory Visit | Attending: Hematology | Admitting: Hematology

## 2014-08-31 ENCOUNTER — Telehealth: Payer: Self-pay | Admitting: Hematology

## 2014-08-31 VITALS — BP 141/95 | HR 90 | Temp 98.3°F | Resp 18 | Ht 67.0 in | Wt 199.2 lb

## 2014-08-31 DIAGNOSIS — Z170 Estrogen receptor positive status [ER+]: Secondary | ICD-10-CM | POA: Diagnosis not present

## 2014-08-31 DIAGNOSIS — C50212 Malignant neoplasm of upper-inner quadrant of left female breast: Secondary | ICD-10-CM | POA: Diagnosis not present

## 2014-08-31 DIAGNOSIS — I2510 Atherosclerotic heart disease of native coronary artery without angina pectoris: Secondary | ICD-10-CM | POA: Diagnosis not present

## 2014-08-31 DIAGNOSIS — C773 Secondary and unspecified malignant neoplasm of axilla and upper limb lymph nodes: Secondary | ICD-10-CM

## 2014-08-31 DIAGNOSIS — Z800 Family history of malignant neoplasm of digestive organs: Secondary | ICD-10-CM

## 2014-08-31 DIAGNOSIS — Z803 Family history of malignant neoplasm of breast: Secondary | ICD-10-CM

## 2014-08-31 DIAGNOSIS — Z808 Family history of malignant neoplasm of other organs or systems: Secondary | ICD-10-CM

## 2014-08-31 MED ORDER — IOHEXOL 300 MG/ML  SOLN
100.0000 mL | Freq: Once | INTRAMUSCULAR | Status: AC | PRN
  Administered 2014-08-31: 100 mL via INTRAVENOUS

## 2014-08-31 MED ORDER — TECHNETIUM TC 99M MEDRONATE IV KIT
27.2000 | PACK | Freq: Once | INTRAVENOUS | Status: AC | PRN
  Administered 2014-08-31: 27.2 via INTRAVENOUS

## 2014-08-31 NOTE — Progress Notes (Signed)
[place]  [phone] Fax:([phone]) [place] Note   Patient Care Team: Katherina Mires, MD as PCP - General (Family Medicine) Autumn Messing III, MD as Consulting Physician (General Surgery) Sherren Mocha, MD as Consulting Physician (Cardiology) 08/31/2014  CHIEF COMPLAINTS/PURPOSE OF CONSULTATION:  Newly diagnosed left breast cancer   Oncology History   Breast cancer of upper-inner quadrant of left female breast   Staging form: Breast, AJCC 7th Edition     Clinical: Stage IIIA (T3, N1, M0) - Unsigned       Breast cancer of upper-inner quadrant of left female breast   08/06/2014 Initial Diagnosis Breast cancer of upper-inner quadrant of left female breast   08/06/2014 Initial Biopsy Left breast 9:30 o'clock position showed invasive ductal carcinoma, and 1 left axillary lymph node biopsy showed positive for metastatic carcinoma.   08/06/2014 Receptors her2 ER 100% positive, PR 5% positive, Ki-67 50%   08/06/2014 Mammogram Diagnostic mammogram and ultrasound showed left breast mass with associated calcification measuring 5.5 cm, indeterminate 8 left axillary lymph node was cortical thickening, circumscribed low density oval 54m mass at 12:00 within the left breast.    HISTORY OF PRESENTING ILLNESS:  Mary Grill59y.o. female is here because of recently newly diagnosed left breast cancer. She presents to the clinic with her daughter.  This was discovered by screening mammogram. Her prior mammogram was 2 years ago. She noticed mild left nipple retraction, no palpable mass, no other changes. She feels well overall, denies any significant pain except mild right knee pain from arthritis. She denies any change of her appetite, or recent weight loss. 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   Her family history is notable for breast cancer in her brother, pancreatic cancer in another brother, and colon cancer in her mother. She  is postmenopausal, last menstrual period 8 years ago. She has a daughter and a son. She lives with her brother, and her daughter is likely going to stay with her.  MEDICAL HISTORY:  Past Medical History  Diagnosis Date  . Hypertension   . Angina effort   . NSTEMI (non-ST elevated myocardial infarction) 03/16/2014    non-obs CAD, spasm seen  . Pneumonia 2009  . Arthritis     "right knee" (03/16/2014)  . 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 Other surgical history  1991    Tubal pregnancy  . Cardiac catheterization  2010; 03/16/2014  . Tubal ligation  1981  . Left heart catheterization with coronary angiogram N/A 03/16/2014    Procedure: LEFT HEART CATHETERIZATION WITH CORONARY ANGIOGRAM;  Surgeon: MSherren Mocha MD; Non-obs CAD, EF 55%  . Bunionectomy Bilateral   . Breast biopsy Left    GYN HISTORY  Menarchal: 15 LMP: 51 Contraceptive: 4 HRT: no G2P2: no breast feeding     SOCIAL HISTORY: Social History   Social History  . Marital Status: Legally Separated    Spouse Name: N/A  . Number of Children: Daughter 366 son 343  . Years of Education: N/A   Occupational History  . Works for post office    Social History Main Topics  . Smoking status: Never Smoker   . Smokeless tobacco: Never Used  . Alcohol Use: No  . Drug Use: No  . Sexual Activity: No   Other Topics Concern  . Not on file  Social History Narrative    FAMILY HISTORY: Family History  Problem Relation Age of Onset  . Pancreatic cancer Brother 7  . Cancer Brother 40    breast cancer   . Cancer Mother 61    colon cancer   . Hypertension Mother   . Diabetes type II Mother   . CVA Father   . Healthy Sister   . Healthy Sister     ALLERGIES:  is allergic to codeine and carvedilol.  MEDICATIONS:  Current Outpatient Prescriptions  Medication Sig Dispense Refill  . amLODipine (NORVASC) 10 MG tablet  Take 1 tablet (10 mg total) by mouth daily. 30 tablet 11  . aspirin EC 81 MG tablet Take 81 mg by mouth daily.    Marland Kitchen atorvastatin (LIPITOR) 40 MG tablet Take 1 tablet (40 mg total) by mouth daily at 6 PM. 30 tablet 11  . hydrochlorothiazide (HYDRODIURIL) 25 MG tablet Take 25 mg by mouth daily.    . irbesartan (AVAPRO) 300 MG tablet Take 300 mg by mouth daily.     . isosorbide mononitrate (IMDUR) 30 MG 24 hr tablet Take 1 tablet (30 mg total) by mouth daily. 30 tablet 11  . nitroGLYCERIN (NITROSTAT) 0.4 MG SL tablet Place 1 tablet (0.4 mg total) under the tongue every 5 (five) minutes as needed for chest pain. 25 tablet 12  . oxyCODONE-acetaminophen (ROXICET) 5-325 MG per tablet Take 1-2 tablets by mouth every 4 (four) hours as needed. 50 tablet 0  . potassium chloride (K-DUR) 10 MEQ tablet Take 1 tablet (10 mEq total) by mouth daily. 30 tablet 11   No current facility-administered medications for this visit.    REVIEW OF SYSTEMS:   Constitutional: Denies fevers, chills or abnormal night sweats Eyes: Denies blurriness of vision, double vision or watery eyes Ears, nose, mouth, throat, and face: Denies mucositis or sore throat Respiratory: Denies cough, dyspnea or wheezes Cardiovascular: Denies palpitation, chest discomfort or lower extremity swelling Gastrointestinal:  Denies nausea, heartburn or change in bowel habits Skin: Denies abnormal skin rashes Lymphatics: Denies new lymphadenopathy or easy bruising Neurological:Denies numbness, tingling or new weaknesses Behavioral/Psych: Mood is stable, no new changes  All other systems were reviewed with the patient and are negative.  PHYSICAL EXAMINATION: ECOG PERFORMANCE STATUS: 0 - Asymptomatic  There were no vitals filed for this visit. There were no vitals filed for this visit.  GENERAL:alert, no distress and comfortable SKIN: skin color, texture, turgor are normal, no rashes or significant lesions EYES: normal, conjunctiva are pink and  non-injected, sclera clear OROPHARYNX:no exudate, no erythema and lips, buccal mucosa, and tongue normal  NECK: supple, thyroid normal size, non-tender, without nodularity LYMPH:  no palpable lymphadenopathy in the cervical, axillary or inguinal LUNGS: clear to auscultation and percussion with normal breathing effort HEART: regular rate & rhythm and no murmurs and no lower extremity edema ABDOMEN:abdomen soft, non-tender and normal bowel sounds Musculoskeletal:no cyanosis of digits and no clubbing  PSYCH: alert & oriented x 3 with fluent speech NEURO: no focal motor/sensory deficits Breasts: Breast inspection showed them to be symmetrical with no nipple discharge. Slight left nipple retraction. A 2.5 cm mass behind the nipple. Palpation of the right breasts and both axillas revealed no obvious mass that I could appreciate.   LABORATORY DATA:  I have reviewed the data as listed CBC Latest Ref Rng 08/25/2014 03/16/2014 03/16/2014  WBC 4.0 - 10.5 K/uL 5.5 5.8 4.3  Hemoglobin 12.0 - 15.0 g/dL 12.4 12.7 13.3  Hematocrit 36.0 - 46.0 %  38.4 37.9 40.0  Platelets 150 - 400 K/uL 221 194 216    CMP Latest Ref Rng 08/25/2014 03/27/2014 03/17/2014  Glucose 65 - 99 mg/dL 93 97 136(H)  BUN 6 - 20 mg/dL _0 Creatinine 0.44 - 1.00 mg/dL 1.05(H) 1.08 1.05  Sodium 135 - 145 mmol/L 139 139 141  Potassium 3.5 - 5.1 mmol/L 3.4(L) 4.1 3.4(L)  Chloride 101 - 111 mmol/L 102 101 108  CO2 22 - 32 mmol/L _1 Calcium 8.9 - 10.3 mg/dL 10.4(H) 10.8(H) 9.3  Total Protein 6.0 - 8.3 g/dL - - -  Total Bilirubin 0.3 - 1.2 mg/dL - - -  Alkaline Phos 39 - 117 U/L - - -  AST 0 - 37 U/L - - -  ALT 0 - 35 U/L - - -     PATHOLOGY REPORT:  Diagnosis 08/06/2014 1. Breast, left, needle core biopsy, 9:30 o'clock, 1 CMFN - INVASIVE DUCTAL CARCINOMA, SEE COMMENT. 2. Lymph node, needle/core biopsy, left axillary lymph node - ONE LYMPH NODE, POSITIVE FOR METASTATIC MAMMARY CARCINOMA (1/1) 1. Although the grade of  tumor is best assessed at resection, with these biopsies, the invasive tumor is grade 1. Breast prognostic studies are pending (part 1 only) and will be reported in an addendum. The case is reviewed with Dr. Avis Epley who concurs. (CRR:gt, 08/07/14)  Results: IMMUNOHISTOCHEMICAL AND MORPHOMETRIC ANALYSIS BY THE AUTOMATED CELLULAR IMAGING SYSTEM (ACIS) Estrogen Receptor: 100%, POSITIVE, STRONG STAINING INTENSITY (PERFORMED MANUALLY) Progesterone Receptor: 5%, POSITIVE, WEAK STAINING INTENSITY Proliferation Marker Ki67: 50% (PERFORMED MANUALLY)  Results: HER2 - NEGATIVE RATIO OF HER2/CEP17 SIGNALS 1.47 AVERAGE HER2 COPY NUMBER PER CELL 2.35  RADIOGRAPHIC STUDIES: I have personally reviewed the radiological images as listed and agreed with the findings in the report.  US Breast Ltd Uni Left Inc Axilla 08/06/2014    FINDINGS: CC and MLO views of the left breast with tomosynthesis and CC and ML magnification views were performed. There is an approximately 2 cm irregular mass within the slightly medial, subareolar left breast. In addition, there are associated coarse heterogeneous calcifications. In total, the mass and calcifications span approximately 5.5 cm. A prominent lymph node is noted within the left axilla. Several adjacent oval, circumscribed low-density masses are noted within the left breast at 12 o'clock, middle depth.  Mammographic images were processed with CAD.  On physical exam, a palpable mass is noted within the left breast at 930, 1 cm from the nipple. No discrete mass is felt in the 12 o'clock region.  Targeted ultrasound is performed, showing an irregular hypoechoic mass at 930, 1 cm from the nipple measuring approximately 15 x 13 x 14 mm, corresponding to the palpable abnormality on exam. Dilated ducts extend from the mass to the level of the nipple, concerning for intraductal extension. The posterior extent of the calcifications seen mammographically is not well seen on ultrasound. At  12:30, 5 cm from the nipple, there is an oval, hypoechoic, circumscribed mass measuring approximately 8 x 8 x 2 mm which is thought to correspond to the largest of the oval low-density masses seen mammographically at 12 o'clock. No internal vascularity is identified.  Targeted exam of the left axilla demonstrates an abnormal appearing lymph node with cortical thickening measuring up to 5 mm.    IMPRESSION: 1. Highly suspicious left breast mass with associated pleomorphic calcifications which together span approximately 5.5 cm. Sonographic findings are concerning for intraductal extension of carcinoma to the level of the nipple. 2. Indeterminate left  axillary lymph node with cortical thickening. 3. Circumscribed, low-density oval masses at 12 o'clock within the left breast.  RECOMMENDATION: 1. Ultrasound-guided core biopsy of the left breast mass at 930 and of the left axillary lymph node with cortical thickening. If biopsy of the left breast mass at 930 demonstrates malignancy, breast MRI is recommended for further evaluation of nipple involvement. 2. The patient's outside prior mammograms will be obtained for comparison. If the low density oval masses seen within the left breast at 12 o'clock are unchanged in appearance, no further evaluation is recommended. An addendum will be made when the comparison films are available.  I have discussed the findings and recommendations with the patient. Results were also provided in writing at the conclusion of the visit. If applicable, a reminder letter will be sent to the patient regarding the next appointment.  BI-RADS CATEGORY  5: Highly suggestive of malignancy.   Electronically Signed   By: Pamelia Hoit M.D.   On: 08/06/2014 12:16   Breast MRI 08/25/2014 Right breast: No mass or abnormal enhancement.  Left breast: There is an irregular mass immediately posterior to the left nipple. Mass demonstrates rapid persistent type enhancement kinetics and extends to the base  of the nipple. The within the mass there is tissue marker clip artifact following recent ultrasound-guided core biopsy. Posterior to the mass there is clumped linear non mass enhancement extending into the lower inner quadrant, suspicious for ductal carcinoma in situ. This enhancement correlates well with the suspicious microcalcifications seen mammographically. Mass and non mass enhancement measured together are 2.5 x 2.5 x 6.6 (anterior to posterior) cm. No additional sites of concern are identified within the left breast.  Lymph nodes: Enlarged lower left axillary lymph node is identified, associated with tissue marker clip following recent ultrasound-guided core biopsy. Additional normal morphology and left axillary lymph nodes are demonstrated. Right axillary lymph nodes and internal mammary chain are unremarkable in appearance.  Ancillary findings: None.  IMPRESSION: 1. Mass and non mass enhancement in the retroareolar and lower inner quadrant of the left breast. Enhancement from the mass does appear to involve the base of the left nipple. The extent of disease is felt to be well demonstrated mammographically. 2. Enlarged left axillary lymph node, corresponding to known metastatic disease as identified on recent core biopsy. 3. Right breast is negative.   Echo 08/24/2014 Study Conclusions  - Left ventricle: The cavity size was normal. Wall thickness was normal. Systolic function was normal. The estimated ejection fraction was in the range of 55% to 60%. - Left atrium: The atrium was mildly dilated.  Bone scan 08/31/2014 IMPRESSION: 1. Tiny focus of increased uptake on the right at approximately T8. No abnormalities demonstrated on the coronal or sagittal images through the thoracic spine on the previous CT scan. Mild bilateral increased uptake at approximately T4 that is likely degenerative. 2. No abnormal uptake observed elsewhere.  CT chest, abdomen or pelvis  08/31/2014 IMPRESSION: 2 cm left breast mass, consistent with known primary breast carcinoma. No evidence of metastatic disease within the chest, abdomen, or pelvis.  Small hiatal hernia incidentally noted.    ASSESSMENT & PLAN:  59 year old African-American female, postmenopausal, healthy and fit, who was found to have a left breast mass and a biopsy confirmed node metastasis by screening mammogram.  1. Left breast invasive ductal carcinoma, cT3N1M0, stage IIIA, ER+/PR+/HER2- -I reviewed her mammograms, breast ultrasound and MRI findings and core needle biopsy results. -I reviewed her bone scan and CT scan results with  her today. She has a very small uptake at T8 on bone scan, but no collar related changes on the CT scan. She is not symptomatic, I think the likelihood of bone metastasis is very low. CT scan was negative for any distant metastasis. - I recommend neoadjuvant chemotherapy. Giving the large primary tumor, node positive disease, high Ki-67, she has very high risk of cancer recurrence after surgery. -given her ER+/HER2- disease, I recommend dose dense Adriamycin and Cytoxan every 2 weeks for fourth cycle, followed by weekly Taxol for 12 weeks. --Chemotherapy consent: Side effects including but does not not limited to, fatigue, nausea, vomiting, diarrhea, hair loss, neuropathy, fluid retention, renal and kidney dysfunction, neutropenic fever, needed for blood transfusion, bleeding, congestive heart failure, small risk of leukemia or MDS, were discussed with patient in great detail. She agrees to proceed. -Giving the strong ER/PR positivity, I would recommend adjuvant endocrine therapy with aromatase inhibitor after her surgery and radiation. -Giving her positive lymph nodes, she would likely need adjuvant radiation, we'll refer her.  -Echocardiogram showed normal EF. -I'll tentatively start her chemotherapy this Thursday, September 1  2. Cancer genetics She has very strong  family history of breast, colon and pancreatic cancer, all in first-degree, I recommend her to see genetic counselor to ruled out inheritable breast cancer syndrome. She agrees with the plan, I'll set up her referral.   3. History of non-STEMI in 03/2014 -She had cardiac catheterization, which showed mild diffuse coronary artery disease, but no need intervention.  -She will follow-up with her cardiologist Dr. Burt Knack -We discussed some of the chemotherapy drug may cause heart failure, we'll repeat her apical, she may need follow-up with Dr. Burt Knack during her chemotherapy.    Plan -first cycle AC on 9/1 -We will see her back before her second cycle chemotherapy on September 15   All questions were answered. The patient knows to call the clinic with any problems, questions or concerns. I spent 25 minutes counseling the patient face to face. The total time spent in the appointment was 30 minutes and more than 50% was on counseling.     Truitt Merle, MD 08/31/2014 8:01 AM

## 2014-08-31 NOTE — Telephone Encounter (Signed)
per pof to sch pt appt-gave pt copy of avs-sent MW emailt o sch pt trmt-*per Dr Bennie Hind ok to sch w/ Lisa-will call pt after reply

## 2014-09-02 ENCOUNTER — Telehealth: Payer: Self-pay | Admitting: *Deleted

## 2014-09-02 ENCOUNTER — Encounter: Payer: Self-pay | Admitting: Hematology

## 2014-09-02 ENCOUNTER — Telehealth: Payer: Self-pay | Admitting: Hematology

## 2014-09-02 ENCOUNTER — Other Ambulatory Visit: Payer: Self-pay | Admitting: Pharmacist

## 2014-09-02 ENCOUNTER — Other Ambulatory Visit: Payer: Self-pay | Admitting: *Deleted

## 2014-09-02 DIAGNOSIS — C50212 Malignant neoplasm of upper-inner quadrant of left female breast: Secondary | ICD-10-CM

## 2014-09-02 MED ORDER — LIDOCAINE-PRILOCAINE (BULK) 2.5-2.5 % CREA
5.0000 mL | TOPICAL_CREAM | Status: DC | PRN
Start: 2014-09-02 — End: 2015-03-31

## 2014-09-02 MED ORDER — LORAZEPAM 0.5 MG PO TABS: ORAL_TABLET | ORAL | Status: DC

## 2014-09-02 MED ORDER — DEXAMETHASONE 4 MG PO TABS: ORAL_TABLET | ORAL | Status: DC

## 2014-09-02 MED ORDER — ONDANSETRON HCL 8 MG PO TABS: 8.0000 mg | ORAL_TABLET | Freq: Two times a day (BID) | ORAL | Status: DC | PRN

## 2014-09-02 MED ORDER — PROCHLORPERAZINE MALEATE 10 MG PO TABS: 10.0000 mg | ORAL_TABLET | Freq: Four times a day (QID) | ORAL | Status: DC | PRN

## 2014-09-02 NOTE — Telephone Encounter (Signed)
Per staff message and POF I have scheduled appts. Advised scheduler of appts. JMW  

## 2014-09-02 NOTE — Progress Notes (Signed)
I placed fmla forms for patient/son/daughter on desk of nurse for dr. Burr Medico.

## 2014-09-02 NOTE — Telephone Encounter (Signed)
per reply from Michelle-cld pt and adv of time & date of appt for 8/31-pt understood

## 2014-09-02 NOTE — Telephone Encounter (Signed)
Call from patient reporting she "does not have Emla cream for first treatment tomorrow and the pharmacy has not received order".  Checked pending orders which include emla, decadron, zofran, compazine and ativan.  Denies having any medicines from Dr. Burr Medico.  Uses [facility] on [facility].  This nurse will send orders as entered by Dr. Ernestina Penna treatment plan.

## 2014-09-03 ENCOUNTER — Ambulatory Visit (HOSPITAL_BASED_OUTPATIENT_CLINIC_OR_DEPARTMENT_OTHER): Payer: Federal, State, Local not specified - PPO

## 2014-09-03 ENCOUNTER — Other Ambulatory Visit (HOSPITAL_BASED_OUTPATIENT_CLINIC_OR_DEPARTMENT_OTHER): Payer: Federal, State, Local not specified - PPO

## 2014-09-03 ENCOUNTER — Other Ambulatory Visit: Payer: Self-pay | Admitting: Hematology

## 2014-09-03 DIAGNOSIS — Z5111 Encounter for antineoplastic chemotherapy: Secondary | ICD-10-CM | POA: Diagnosis not present

## 2014-09-03 DIAGNOSIS — C50212 Malignant neoplasm of upper-inner quadrant of left female breast: Secondary | ICD-10-CM | POA: Diagnosis not present

## 2014-09-03 DIAGNOSIS — C773 Secondary and unspecified malignant neoplasm of axilla and upper limb lymph nodes: Secondary | ICD-10-CM

## 2014-09-03 LAB — CBC WITH DIFFERENTIAL/PLATELET
BASO%: 0.2 % (ref 0.0–2.0)
Basophils Absolute: 0 10*3/uL (ref 0.0–0.1)
EOS%: 1.2 % (ref 0.0–7.0)
Eosinophils Absolute: 0.1 10*3/uL (ref 0.0–0.5)
HEMATOCRIT: 38 % (ref 34.8–46.6)
HEMOGLOBIN: 12.6 g/dL (ref 11.6–15.9)
LYMPH#: 1.6 10*3/uL (ref 0.9–3.3)
LYMPH%: 30.6 % (ref 14.0–49.7)
MCH: 29.1 pg (ref 25.1–34.0)
MCHC: 33.2 g/dL (ref 31.5–36.0)
MCV: 87.8 fL (ref 79.5–101.0)
MONO#: 0.3 10*3/uL (ref 0.1–0.9)
MONO%: 6.5 % (ref 0.0–14.0)
NEUT#: 3.1 10*3/uL (ref 1.5–6.5)
NEUT%: 61.5 % (ref 38.4–76.8)
Platelets: 211 10*3/uL (ref 145–400)
RBC: 4.33 10*6/uL (ref 3.70–5.45)
RDW: 13.4 % (ref 11.2–14.5)
WBC: 5.1 10*3/uL (ref 3.9–10.3)

## 2014-09-03 LAB — COMPREHENSIVE METABOLIC PANEL (CC13)
ALBUMIN: 3.8 g/dL (ref 3.5–5.0)
ALT: 10 U/L (ref 0–55)
AST: 11 U/L (ref 5–34)
Alkaline Phosphatase: 100 U/L (ref 40–150)
Anion Gap: 8 mEq/L (ref 3–11)
BUN: 19 mg/dL (ref 7.0–26.0)
CALCIUM: 10.6 mg/dL — AB (ref 8.4–10.4)
CHLORIDE: 103 meq/L (ref 98–109)
CO2: 27 mEq/L (ref 22–29)
CREATININE: 1.1 mg/dL (ref 0.6–1.1)
EGFR: 62 mL/min/{1.73_m2} — ABNORMAL LOW (ref >90)
GLUCOSE: 140 mg/dL (ref 70–140)
Potassium: 3.9 mEq/L (ref 3.5–5.1)
Sodium: 139 mEq/L (ref 136–145)
Total Bilirubin: 0.7 mg/dL (ref 0.20–1.20)
Total Protein: 7.1 g/dL (ref 6.4–8.3)

## 2014-09-03 MED ORDER — DOXORUBICIN HCL CHEMO IV INJECTION 2 MG/ML
60.0000 mg/m2 | Freq: Once | INTRAVENOUS | Status: AC
  Administered 2014-09-03: 124 mg via INTRAVENOUS
  Filled 2014-09-03: qty 62

## 2014-09-03 MED ORDER — SODIUM CHLORIDE 0.9 % IJ SOLN
10.0000 mL | INTRAMUSCULAR | Status: DC | PRN
  Administered 2014-09-03: 10 mL
  Filled 2014-09-03: qty 10

## 2014-09-03 MED ORDER — PALONOSETRON HCL INJECTION 0.25 MG/5ML
INTRAVENOUS | Status: AC
  Filled 2014-09-03: qty 5

## 2014-09-03 MED ORDER — FOSAPREPITANT DIMEGLUMINE INJECTION 150 MG
Freq: Once | INTRAVENOUS | Status: AC
  Administered 2014-09-03: 15:00:00 via INTRAVENOUS
  Filled 2014-09-03: qty 5

## 2014-09-03 MED ORDER — PALONOSETRON HCL INJECTION 0.25 MG/5ML
0.2500 mg | Freq: Once | INTRAVENOUS | Status: AC
  Administered 2014-09-03: 0.25 mg via INTRAVENOUS

## 2014-09-03 MED ORDER — SODIUM CHLORIDE 0.9 % IV SOLN
Freq: Once | INTRAVENOUS | Status: AC
  Administered 2014-09-03: 14:00:00 via INTRAVENOUS

## 2014-09-03 MED ORDER — SODIUM CHLORIDE 0.9 % IV SOLN
600.0000 mg/m2 | Freq: Once | INTRAVENOUS | Status: AC
  Administered 2014-09-03: 1240 mg via INTRAVENOUS
  Filled 2014-09-03: qty 62

## 2014-09-03 MED ORDER — HEPARIN SOD (PORK) LOCK FLUSH 100 UNIT/ML IV SOLN
500.0000 [IU] | Freq: Once | INTRAVENOUS | Status: AC | PRN
  Administered 2014-09-03: 500 [IU]
  Filled 2014-09-03: qty 5

## 2014-09-03 NOTE — Patient Instructions (Signed)
Doxorubicin injection What is this medicine? DOXORUBICIN (dox oh ROO bi sin) is a chemotherapy drug. It is used to treat many kinds of cancer like Hodgkin's disease, leukemia, non-Hodgkin's lymphoma, neuroblastoma, sarcoma, and Wilms' tumor. It is also used to treat bladder cancer, breast cancer, lung cancer, ovarian cancer, stomach cancer, and thyroid cancer. This medicine may be used for other purposes; ask your health care provider or pharmacist if you have questions. COMMON BRAND NAME(S): Adriamycin, Adriamycin PFS, Adriamycin RDF, Rubex What should I tell my health care provider before I take this medicine? They need to know if you have any of these conditions: -blood disorders -heart disease, recent heart attack -infection (especially a virus infection such as chickenpox, cold sores, or herpes) -irregular heartbeat -liver disease -recent or ongoing radiation therapy -an unusual or allergic reaction to doxorubicin, other chemotherapy agents, other medicines, foods, dyes, or preservatives -pregnant or trying to get pregnant -breast-feeding How should I use this medicine? This drug is given as an infusion into a vein. It is administered in a hospital or clinic by a specially trained health care professional. If you have pain, swelling, burning or any unusual feeling around the site of your injection, tell your health care professional right away. Talk to your pediatrician regarding the use of this medicine in children. Special care may be needed. Overdosage: If you think you have taken too much of this medicine contact a poison control center or emergency room at once. NOTE: This medicine is only for you. Do not share this medicine with others. What if I miss a dose? It is important not to miss your dose. Call your doctor or health care professional if you are unable to keep an appointment. What may interact with this medicine? Do not take this medicine with any of the following  medications: -cisapride -droperidol -halofantrine -pimozide -zidovudine This medicine may also interact with the following medications: -chloroquine -chlorpromazine -clarithromycin -cyclophosphamide -cyclosporine -erythromycin -medicines for depression, anxiety, or psychotic disturbances -medicines for irregular heart beat like amiodarone, bepridil, dofetilide, encainide, flecainide, propafenone, quinidine -medicines for seizures like ethotoin, fosphenytoin, phenytoin -medicines for nausea, vomiting like dolasetron, ondansetron, palonosetron -medicines to increase blood counts like filgrastim, pegfilgrastim, sargramostim -methadone -methotrexate -pentamidine -progesterone -vaccines -verapamil Talk to your doctor or health care professional before taking any of these medicines: -acetaminophen -aspirin -ibuprofen -ketoprofen -naproxen This list may not describe all possible interactions. Give your health care provider a list of all the medicines, herbs, non-prescription drugs, or dietary supplements you use. Also tell them if you smoke, drink alcohol, or use illegal drugs. Some items may interact with your medicine. What should I watch for while using this medicine? Your condition will be monitored carefully while you are receiving this medicine. You will need important blood work done while you are taking this medicine. This drug may make you feel generally unwell. This is not uncommon, as chemotherapy can affect healthy cells as well as cancer cells. Report any side effects. Continue your course of treatment even though you feel ill unless your doctor tells you to stop. Your urine may turn red for a few days after your dose. This is not blood. If your urine is dark or brown, call your doctor. In some cases, you may be given additional medicines to help with side effects. Follow all directions for their use. Call your doctor or health care professional for advice if you get a  fever, chills or sore throat, or other symptoms of a cold or flu. Do not   treat yourself. This drug decreases your body's ability to fight infections. Try to avoid being around people who are sick. This medicine may increase your risk to bruise or bleed. Call your doctor or health care professional if you notice any unusual bleeding. Be careful brushing and flossing your teeth or using a toothpick because you may get an infection or bleed more easily. If you have any dental work done, tell your dentist you are receiving this medicine. Avoid taking products that contain aspirin, acetaminophen, ibuprofen, naproxen, or ketoprofen unless instructed by your doctor. These medicines may hide a fever. Men and women of childbearing age should use effective birth control methods while using taking this medicine. Do not become pregnant while taking this medicine. There is a potential for serious side effects to an unborn child. Talk to your health care professional or pharmacist for more information. Do not breast-feed an infant while taking this medicine. Do not let others touch your urine or other body fluids for 5 days after each treatment with this medicine. Caregivers should wear latex gloves to avoid touching body fluids during this time. There is a maximum amount of this medicine you should receive throughout your life. The amount depends on the medical condition being treated and your overall health. Your doctor will watch how much of this medicine you receive in your lifetime. Tell your doctor if you have taken this medicine before. What side effects may I notice from receiving this medicine? Side effects that you should report to your doctor or health care professional as soon as possible: -allergic reactions like skin rash, itching or hives, swelling of the face, lips, or tongue -low blood counts - this medicine may decrease the number of white blood cells, red blood cells and platelets. You may be at  increased risk for infections and bleeding. -signs of infection - fever or chills, cough, sore throat, pain or difficulty passing urine -signs of decreased platelets or bleeding - bruising, pinpoint red spots on the skin, black, tarry stools, blood in the urine -signs of decreased red blood cells - unusually weak or tired, fainting spells, lightheadedness -breathing problems -chest pain -fast, irregular heartbeat -mouth sores -nausea, vomiting -pain, swelling, redness at site where injected -pain, tingling, numbness in the hands or feet -swelling of ankles, feet, or hands -unusual bleeding or bruising Side effects that usually do not require medical attention (report to your doctor or health care professional if they continue or are bothersome): -diarrhea -facial flushing -hair loss -loss of appetite -missed menstrual periods -nail discoloration or damage -red or watery eyes -red colored urine -stomach upset This list may not describe all possible side effects. Call your doctor for medical advice about side effects. You may report side effects to FDA at 1-800-FDA-1088. Where should I keep my medicine? This drug is given in a hospital or clinic and will not be stored at home. NOTE: This sheet is a summary. It may not cover all possible information. If you have questions about this medicine, talk to your doctor, pharmacist, or health care provider.  2015, Elsevier/Gold Standard. (2012-04-16 09:54:34)  Cyclophosphamide injection What is this medicine? CYCLOPHOSPHAMIDE (sye kloe FOSS fa mide) is a chemotherapy drug. It slows the growth of cancer cells. This medicine is used to treat many types of cancer like lymphoma, myeloma, leukemia, breast cancer, and ovarian cancer, to name a few. This medicine may be used for other purposes; ask your health care provider or pharmacist if you have questions. COMMON BRAND NAME(S):   Cytoxan, Neosar What should I tell my health care provider before I  take this medicine? They need to know if you have any of these conditions: -blood disorders -history of other chemotherapy -infection -kidney disease -liver disease -recent or ongoing radiation therapy -tumors in the bone marrow -an unusual or allergic reaction to cyclophosphamide, other chemotherapy, other medicines, foods, dyes, or preservatives -pregnant or trying to get pregnant -breast-feeding How should I use this medicine? This drug is usually given as an injection into a vein or muscle or by infusion into a vein. It is administered in a hospital or clinic by a specially trained health care professional. Talk to your pediatrician regarding the use of this medicine in children. Special care may be needed. Overdosage: If you think you have taken too much of this medicine contact a poison control center or emergency room at once. NOTE: This medicine is only for you. Do not share this medicine with others. What if I miss a dose? It is important not to miss your dose. Call your doctor or health care professional if you are unable to keep an appointment. What may interact with this medicine? This medicine may interact with the following medications: -amiodarone -amphotericin B -azathioprine -certain antiviral medicines for HIV or AIDS such as protease inhibitors (e.g., indinavir, ritonavir) and zidovudine -certain blood pressure medications such as benazepril, captopril, enalapril, fosinopril, lisinopril, moexipril, monopril, perindopril, quinapril, ramipril, trandolapril -certain cancer medications such as anthracyclines (e.g., daunorubicin, doxorubicin), busulfan, cytarabine, paclitaxel, pentostatin, tamoxifen, trastuzumab -certain diuretics such as chlorothiazide, chlorthalidone, hydrochlorothiazide, indapamide, metolazone -certain medicines that treat or prevent blood clots like warfarin -certain muscle relaxants such as  succinylcholine -cyclosporine -etanercept -indomethacin -medicines to increase blood counts like filgrastim, pegfilgrastim, sargramostim -medicines used as general anesthesia -metronidazole -natalizumab This list may not describe all possible interactions. Give your health care provider a list of all the medicines, herbs, non-prescription drugs, or dietary supplements you use. Also tell them if you smoke, drink alcohol, or use illegal drugs. Some items may interact with your medicine. What should I watch for while using this medicine? Visit your doctor for checks on your progress. This drug may make you feel generally unwell. This is not uncommon, as chemotherapy can affect healthy cells as well as cancer cells. Report any side effects. Continue your course of treatment even though you feel ill unless your doctor tells you to stop. Drink water or other fluids as directed. Urinate often, even at night. In some cases, you may be given additional medicines to help with side effects. Follow all directions for their use. Call your doctor or health care professional for advice if you get a fever, chills or sore throat, or other symptoms of a cold or flu. Do not treat yourself. This drug decreases your body's ability to fight infections. Try to avoid being around people who are sick. This medicine may increase your risk to bruise or bleed. Call your doctor or health care professional if you notice any unusual bleeding. Be careful brushing and flossing your teeth or using a toothpick because you may get an infection or bleed more easily. If you have any dental work done, tell your dentist you are receiving this medicine. You may get drowsy or dizzy. Do not drive, use machinery, or do anything that needs mental alertness until you know how this medicine affects you. Do not become pregnant while taking this medicine or for 1 year after stopping it. Women should inform their doctor if they wish to become    pregnant or think they might be pregnant. Men should not father a child while taking this medicine and for 4 months after stopping it. There is a potential for serious side effects to an unborn child. Talk to your health care professional or pharmacist for more information. Do not breast-feed an infant while taking this medicine. This medicine may interfere with the ability to have a child. This medicine has caused ovarian failure in some women. This medicine has caused reduced sperm counts in some men. You should talk with your doctor or health care professional if you are concerned about your fertility. If you are going to have surgery, tell your doctor or health care professional that you have taken this medicine. What side effects may I notice from receiving this medicine? Side effects that you should report to your doctor or health care professional as soon as possible: -allergic reactions like skin rash, itching or hives, swelling of the face, lips, or tongue -low blood counts - this medicine may decrease the number of white blood cells, red blood cells and platelets. You may be at increased risk for infections and bleeding. -signs of infection - fever or chills, cough, sore throat, pain or difficulty passing urine -signs of decreased platelets or bleeding - bruising, pinpoint red spots on the skin, black, tarry stools, blood in the urine -signs of decreased red blood cells - unusually weak or tired, fainting spells, lightheadedness -breathing problems -dark urine -dizziness -palpitations -swelling of the ankles, feet, hands -trouble passing urine or change in the amount of urine -weight gain -yellowing of the eyes or skin Side effects that usually do not require medical attention (report to your doctor or health care professional if they continue or are bothersome): -changes in nail or skin color -hair loss -missed menstrual periods -mouth sores -nausea, vomiting This list may not  describe all possible side effects. Call your doctor for medical advice about side effects. You may report side effects to FDA at 1-800-FDA-1088. Where should I keep my medicine? This drug is given in a hospital or clinic and will not be stored at home. NOTE: This sheet is a summary. It may not cover all possible information. If you have questions about this medicine, talk to your doctor, pharmacist, or health care provider.  2015, Elsevier/Gold Standard. (2011-11-03 16:22:58)  [facility] Discharge Instructions for Patients Receiving Chemotherapy  Today you received the following chemotherapy agents Adriamycin/Cytoxan.  To help prevent nausea and vomiting after your treatment, we encourage you to take your nausea medication as directed.   If you develop nausea and vomiting that is not controlled by your nausea medication, call the clinic.   BELOW ARE SYMPTOMS THAT SHOULD BE REPORTED IMMEDIATELY:  *FEVER GREATER THAN 100.5 F  *CHILLS WITH OR WITHOUT FEVER  NAUSEA AND VOMITING THAT IS NOT CONTROLLED WITH YOUR NAUSEA MEDICATION  *UNUSUAL SHORTNESS OF BREATH  *UNUSUAL BRUISING OR BLEEDING  TENDERNESS IN MOUTH AND THROAT WITH OR WITHOUT PRESENCE OF ULCERS  *URINARY PROBLEMS  *BOWEL PROBLEMS  UNUSUAL RASH Items with * indicate a potential emergency and should be followed up as soon as possible.  Feel free to call the clinic you have any questions or concerns. The clinic phone number is [phone].  Please show the CHEMO ALERT CARD at check-in to the Emergency Department and triage nurse.     

## 2014-09-04 ENCOUNTER — Ambulatory Visit: Payer: Federal, State, Local not specified - PPO

## 2014-09-04 ENCOUNTER — Encounter: Payer: Self-pay | Admitting: Hematology

## 2014-09-04 ENCOUNTER — Telehealth: Payer: Self-pay | Admitting: *Deleted

## 2014-09-04 NOTE — Progress Notes (Signed)
I left message for the patient also to let her know her forms ready and if I need to put in envelope and send to [employer]-not sure if for hers and son's [place]. See prev notes. I advised her also I mailed her a copy for her records also.

## 2014-09-04 NOTE — Telephone Encounter (Signed)
Spoke to pt concerning 1st chemo on 9/1. Relate doing well and without complaints. Discussed anti-nausea medications and fluid intake. Received verbal understanding. Denies needs or questions at this time. Encourage pt to call with concerns.

## 2014-09-04 NOTE — Progress Notes (Signed)
I left message for daughter shenique to confirm if all forms can go in that envelope or she knows of a fax# I can fax all of them to [place]. I also advised I mailed her copy of form and will be going to Alexza's address so they will have for their records.

## 2014-09-04 NOTE — Progress Notes (Signed)
I called and left the son Mary Randall a message to make sure I am to mail to [place] postal service(envelope was supplied). I also let him know I was mailing copies of all forms to his mom's address to they will have for their records. I left my ph# to call me back.

## 2014-09-05 ENCOUNTER — Ambulatory Visit (HOSPITAL_BASED_OUTPATIENT_CLINIC_OR_DEPARTMENT_OTHER): Payer: Federal, State, Local not specified - PPO

## 2014-09-05 VITALS — BP 132/81 | HR 75 | Temp 98.2°F | Resp 18

## 2014-09-05 DIAGNOSIS — Z5189 Encounter for other specified aftercare: Secondary | ICD-10-CM | POA: Diagnosis not present

## 2014-09-05 DIAGNOSIS — C50212 Malignant neoplasm of upper-inner quadrant of left female breast: Secondary | ICD-10-CM

## 2014-09-05 MED ORDER — PEGFILGRASTIM INJECTION 6 MG/0.6ML ~~LOC~~
6.0000 mg | PREFILLED_SYRINGE | Freq: Once | SUBCUTANEOUS | Status: AC
  Administered 2014-09-05: 6 mg via SUBCUTANEOUS

## 2014-09-08 ENCOUNTER — Encounter: Payer: Self-pay | Admitting: *Deleted

## 2014-09-08 NOTE — Progress Notes (Signed)
[facility] Psychosocial Distress Screening Clinical Social Work  Clinical Social Work was referred by distress screening protocol.  The patient scored a 6 on the Psychosocial Distress Thermometer which indicates moderate distress. Clinical Social Worker phoned pt to assess for distress and other psychosocial needs. Pt reports ongoing concerns about nausea and vomiting. CSW suggested pt reach out to RN for guidance. Pt stated understanding. She reports to feel less anxious after going through her first cancer treatment. CSW suggested several other resources such as Breast Cancer Support Group, yoga and Tai Chi to help as well. Pt agrees to consider these options for additional support and reach out to CSW team as well.    [facility] DISTRESS SCREENING 08/31/2014  Screening Type Initial Screening  Distress experienced in past week (1-10) 6  Emotional problem type Nervousness/Anxiety  Physical Problem type Nausea/vomiting  Physician notified of physical symptoms Yes  Referral to clinical social work Yes     Clinical Social Worker follow up needed: No.  If yes, follow up plan: Grier Hock, LCSW Clinical Social Worker [facility]  [facility] Phone: [phone] Fax: [phone]     

## 2014-09-09 ENCOUNTER — Encounter: Payer: Self-pay | Admitting: Hematology

## 2014-09-09 NOTE — Progress Notes (Signed)
Per son Mary Randall he said yes to mail the forms in the envelope given. He also said they recd their copies also. I mailed.

## 2014-09-11 ENCOUNTER — Telehealth: Payer: Self-pay | Admitting: *Deleted

## 2014-09-11 ENCOUNTER — Ambulatory Visit
Admission: RE | Admit: 2014-09-11 | Discharge: 2014-09-11 | Disposition: A | Discharge status: Home / Self Care | Payer: Federal, State, Local not specified - PPO | Source: Ambulatory Visit | Attending: Radiation Oncology | Admitting: Radiation Oncology

## 2014-09-11 VITALS — BP 100/72 | HR 97

## 2014-09-11 DIAGNOSIS — Z170 Estrogen receptor positive status [ER+]: Secondary | ICD-10-CM | POA: Diagnosis not present

## 2014-09-11 DIAGNOSIS — Z510 Encounter for antineoplastic radiation therapy: Secondary | ICD-10-CM | POA: Insufficient documentation

## 2014-09-11 DIAGNOSIS — I252 Old myocardial infarction: Secondary | ICD-10-CM | POA: Diagnosis not present

## 2014-09-11 DIAGNOSIS — Z833 Family history of diabetes mellitus: Secondary | ICD-10-CM | POA: Diagnosis not present

## 2014-09-11 DIAGNOSIS — C50212 Malignant neoplasm of upper-inner quadrant of left female breast: Secondary | ICD-10-CM

## 2014-09-11 DIAGNOSIS — I10 Essential (primary) hypertension: Secondary | ICD-10-CM | POA: Insufficient documentation

## 2014-09-11 DIAGNOSIS — Z8249 Family history of ischemic heart disease and other diseases of the circulatory system: Secondary | ICD-10-CM | POA: Insufficient documentation

## 2014-09-11 DIAGNOSIS — Z800 Family history of malignant neoplasm of digestive organs: Secondary | ICD-10-CM | POA: Diagnosis not present

## 2014-09-11 MED ORDER — LORAZEPAM 0.5 MG PO TABS
0.5000 mg | ORAL_TABLET | Freq: Once | ORAL | Status: AC
  Administered 2014-09-11: 0.5 mg via SUBLINGUAL
  Filled 2014-09-11: qty 1

## 2014-09-11 NOTE — Addendum Note (Signed)
Encounter addended by: Jacqulyn Liner, RN on: 09/11/2014  9:45 AM<BR>     Documentation filed: Inpatient MAR

## 2014-09-11 NOTE — Addendum Note (Signed)
Encounter addended by: Jacqulyn Liner, RN on: 09/11/2014  9:51 AM<BR>     Documentation filed: [employer], Inpatient MAR, Notes Section, Orders

## 2014-09-11 NOTE — Telephone Encounter (Signed)
Spoke with pt and was informed that pt vomited x 1 at home prior to coming for consult with radiation oncologist today.  Pt also vomited again after the consultation - clear white sputum.  Pt was not clear on how to take antiemetics. Went over directions how to take antiemetics with pt.  Offered pt to come in for IVF on Sat 9/10; however, pt declined.  Stated she would force po fluids.  Pt understood to go to ER over weekend if nausea/vomiting worsened  -  not relieved by antiemetics.

## 2014-09-11 NOTE — Progress Notes (Signed)
Patient vomiting and says that compazine has been making her feel worse.  She has vomitied 2 times this morning.  She has zofran and ativan at home but has not taken anything today.  Called Dr. Ernestina Penna office per Dr. Isidore Moos to see if patient needs to see Selena Lesser, FNP for IV zofran.  Left a message for Dr. Ernestina Penna nurse.  Also called Cyndee Bacon's nurse in symptom management.  She said there are 5 patients waiting so it may be an hour before she is seen.  Relayed this to the patient and she does not want to stay for an hour.  Gave her 0.5 mg Ativan SL per Dr. Isidore Moos.   Advised her to try the ativan q 6 hours and to rotate with zofran 2 times daily as needed.  Will continue to monitor.

## 2014-09-11 NOTE — Progress Notes (Addendum)
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 Johnnette Barrios  Past/Anticipated interventions by medical oncology, if any:Dr. Burr Medico: Adriamycin and Cytoxan (starting 09/03/14) every 2 weeks for four cycles, followed by weekly Taxol for 12 weeks.   Lymphedema issues, if any: NO  Pain issues, if any:NO SAFETY ISSUES:NO  Prior radiation?NO  Pacemaker/ICD?NO  Possible current pregnancy? NO  Is the patient on methotrexate? NO  Current Complaints / other details: History of breast cancer in a brother, colon cancer in a sister and pancreatic cancer in another brother. Port a cath insertion. Cardiac catheterization with mild diffuse CAD, following up with cardiologist Dr. Burt Knack.     Nauseated since last night, thnks bad milk with cereal, offered gingerale, c=declined moist cl;oth, warm blanket given,  Allergies: Codene, Hives,n,v, carvediol-nausea,, Percocet          BP 120/81 mmHg  Pulse 100  Temp(Src) 98.3 F (36.8 C) (Oral)  Resp 20  Ht _0  (1.702 m)  Wt 193 lb 12.8 oz (87.907 kg)  BMI 30.35 kg/m2  Wt Readings from Last 3 Encounters:  09/11/14 193 lb 12.8 oz (87.907 kg)  08/31/14 199 lb 3.2 oz (90.357 kg)  08/28/14 199 lb (90.266 kg)

## 2014-09-11 NOTE — Progress Notes (Signed)
Radiation Oncology         [phone]) [phone] ________________________________  Initial outpatient Consultation  Name: Mary Randall MRN: [id number]  Date: 09/11/2014  DOB: March 02, 1955  HC:[id number], Mary Mercury, MD  Jovita Kussmaul, MD   REFERRING PHYSICIAN: Jovita Kussmaul, MD  DIAGNOSIS:    ICD-9-CM ICD-10-CM   1. 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 female who presented with abnormalities in the left breast on screening mammogram on 07/21/2014. Tomoviews on 08/17/2014 of the left breast revealed a 5.5cm suspicious left breast mass and left axillary lymph node with cortical thickening per ultrasound. Ultrasound revealed possible intraductal extension of the tumor to the level of the nipple. MRI of the breast on 08/25/2014 did not appear to show involvement of the base of the nipple, but the area of enhancement in the breast measured 6.6cm. There was an enlarged left axillary lymph node. The right breast was unremarkable.   Her biopsies were performed on 08/06/2014. Biopsy of the breast revealed pathology as stated in the diagnosis. The axillary node was also biopsy and positive for carcinoma. Dr. Burr Medico performed staging studies which show no obvious evidence of metastasis. There is a nonspecific area of uptake at T8 on bone scan. She denies pain.The patient will receive neoadjuvant chemotherapy.  For today's radiation oncology appointment the patient was very nauseous and cold. She was provided with a heated blanket and requested to lye down to attempt to alleviate her symptoms of nausea. As far as other symptoms of complaint she reports a "light pain in my stomach." In regards to what encouraged the patient to receive her initial mammogram the patient explained, "they felt different," in regards to her breasts. The patient is  employed as a Development worker, community carrier, but had to take this week off of work. She projected a healthy mental status and was accompanied by her daughter.  PREVIOUS RADIATION THERAPY: No  PAST MEDICAL HISTORY:  has a past medical history of Hypertension; Angina effort; NSTEMI (non-ST elevated myocardial infarction) (03/16/2014); Pneumonia (2009); Arthritis; and Coronary vasospasm (2010; 03/16/2014).    PAST SURGICAL HISTORY: Past Surgical History  Procedure Laterality Date  . Other surgical history  1991    Tubal pregnancy  . Cardiac catheterization  2010; 03/16/2014  . Tubal ligation  1981  . Left heart catheterization with coronary angiogram N/A 03/16/2014    Procedure: LEFT HEART CATHETERIZATION WITH CORONARY ANGIOGRAM;  Surgeon: Sherren Mocha, MD; Non-obs CAD, EF 55%  . Bunionectomy Bilateral   . Breast biopsy Left 08/06/14  . Portacath placement N/A 08/28/2014    Procedure: INSERTION PORT-A-CATH;  Surgeon: Autumn Messing III, MD;  Location: [facility];  Service: General;  Laterality: N/A;    FAMILY HISTORY: family history includes CVA in her father; Cancer (age of onset: 1) in her brother; Cancer (age of onset: 57) in her mother; Diabetes type II in her mother; Healthy in her sister and sister; Hypertension in her mother; Pancreatic cancer (age of onset: 9) in her brother.  SOCIAL HISTORY:  reports that she has never smoked. She has never used smokeless tobacco. She reports that she does not drink alcohol or use illicit drugs.  ALLERGIES: Codeine; Carvedilol; and Percocet  MEDICATIONS:  Current Outpatient Prescriptions  Medication Sig Dispense Refill  . amLODipine (NORVASC) 10 MG tablet Take  1 tablet (10 mg total) by mouth daily. 30 tablet 11  . aspirin EC 81 MG tablet Take 81 mg by mouth daily.    Marland Kitchen atorvastatin (LIPITOR) 40 MG tablet Take 1 tablet (40 mg total) by mouth daily at 6 PM. 30 tablet 11  . dexamethasone (DECADRON) 4 MG tablet Take 2 pills once the day after chemotherapy and then 2 times a day  for 2 days. Take with food 30 tablet 1  . hydrochlorothiazide (HYDRODIURIL) 25 MG tablet Take 25 mg by mouth daily.    . irbesartan (AVAPRO) 300 MG tablet Take 300 mg by mouth daily.     . isosorbide mononitrate (IMDUR) 30 MG 24 hr tablet Take 1 tablet (30 mg total) by mouth daily. 30 tablet 11  . Lidocaine-Prilocaine, Bulk, 2.5-2.5 % CREA Apply 5 mLs topically as needed. 1.5 hours before use with chemotherapy.Cover with plastic 30 g 3  . LORazepam (ATIVAN) 0.5 MG tablet Take 1 tablet by mouth every 6 hours as needed for nausea or vomiting. (Patient not taking: Reported on 09/11/2014) 30 tablet 0  . nitroGLYCERIN (NITROSTAT) 0.4 MG SL tablet Place 1 tablet (0.4 mg total) under the tongue every 5 (five) minutes as needed for chest pain. (Patient not taking: Reported on 08/31/2014) 25 tablet 12  . ondansetron (ZOFRAN) 8 MG tablet Take 1 tablet (8 mg total) by mouth 2 (two) times daily as needed for nausea or vomiting. Start on 3rd day after Chemotherapy 30 tablet 1  . potassium chloride (K-DUR) 10 MEQ tablet Take 1 tablet (10 mEq total) by mouth daily. 30 tablet 11  . prochlorperazine (COMPAZINE) 10 MG tablet Take 1 tablet (10 mg total) by mouth every 6 (six) hours as needed for nausea or vomiting. (Patient not taking: Reported on 09/11/2014) 30 tablet 1   No current facility-administered medications for this encounter.    REVIEW OF SYSTEMS:  Notable for that above.   PHYSICAL EXAM:    Vitals with BMI 09/11/2014  Height 5\' 7"   Weight 193 lbs 13 oz  BMI 96.2  Systolic 229  Diastolic 81  Pulse 798  Respirations 20   General: Alert and oriented, in no acute distress HEENT: Head is normocephalic. Extraocular movements are intact. Oropharynx is clear. Neck: Neck is supple, no palpable cervical or supraclavicular lymphadenopathy. Heart: Regular in rate and rhythm with no murmurs, rubs, or gallops. Chest: Clear to auscultation bilaterally, with no rhonchi, wheezes, or rales. Abdomen: Soft, nontender,  nondistended, with no rigidity or guarding. Extremities: No cyanosis or edema. Lymphatics: see Neck Exam Skin: No concerning lesions. Musculoskeletal: symmetric strength and muscle tone throughout. Neurologic: Cranial nerves II through XII are grossly intact. No obvious focalities. Speech is fluent. Coordination is intact. Psychiatric: Judgment and insight are intact. Affect is appropriate. Breasts: There is a retroariolar left breast mass, ill defined boarders, approximately extending over 5cm, no abnormal right breast masses axillary regions without any obvious abnormalities bilaterally   ECOG = 1  0 - Asymptomatic (Fully active, able to carry on all predisease activities without restriction)  1 - Symptomatic but completely ambulatory (Restricted in physically strenuous activity but ambulatory and able to carry out work of a light or sedentary nature. For example, light housework, office work)  2 - Symptomatic, <50% in bed during the day (Ambulatory and capable of all self care but unable to carry out any work activities. Up and about more than 50% of waking hours)  3 - Symptomatic, >50% in bed, but not bedbound (Capable  of only limited self-care, confined to bed or chair 50% or more of waking hours)  4 - Bedbound (Completely disabled. Cannot carry on any self-care. Totally confined to bed or chair)  5 - Death   Eustace Pen MM, Creech RH, Tormey DC, et al. [phone]). "Toxicity and response criteria of the [facility]". [place] Oncol. 5 (6): 649-55   LABORATORY DATA:  Lab Results  Component Value Date   WBC 5.1 09/03/2014   HGB 12.6 09/03/2014   HCT 38.0 09/03/2014   MCV 87.8 09/03/2014   PLT 211 09/03/2014   CMP     Component Value Date/Time   NA 139 09/03/2014 1328   NA 139 08/25/2014 1334   K 3.9 09/03/2014 1328   K 3.4* 08/25/2014 1334   CL 102 08/25/2014 1334   CO2 27 09/03/2014 1328   CO2 29 08/25/2014 1334   GLUCOSE 140 09/03/2014 1328    GLUCOSE 93 08/25/2014 1334   BUN 19.0 09/03/2014 1328   BUN 10 08/25/2014 1334   CREATININE 1.1 09/03/2014 1328   CREATININE 1.05* 08/25/2014 1334   CREATININE 1.08 03/27/2014 1153   CALCIUM 10.6* 09/03/2014 1328   CALCIUM 10.4* 08/25/2014 1334   PROT 7.1 09/03/2014 1328   PROT 7.1 03/16/2014 0630   ALBUMIN 3.8 09/03/2014 1328   ALBUMIN 3.9 03/16/2014 0630   AST 11 09/03/2014 1328   AST 19 03/16/2014 0630   ALT 10 09/03/2014 1328   ALT 16 03/16/2014 0630   ALKPHOS 100 09/03/2014 1328   ALKPHOS 89 03/16/2014 0630   BILITOT 0.70 09/03/2014 1328   BILITOT 0.4 03/16/2014 0630   GFRNONAA 57* 08/25/2014 1334   GFRAA >60 08/25/2014 1334      RADIOGRAPHY: Ct Chest W Contrast  08/31/2014   CLINICAL DATA:  Newly diagnosed left upper inner quadrant breast carcinoma.  EXAM: CT CHEST, ABDOMEN, AND PELVIS WITH CONTRAST  TECHNIQUE: Multidetector CT imaging of the chest, abdomen and pelvis was performed following the standard protocol during bolus administration of intravenous contrast.  CONTRAST:  122mL OMNIPAQUE IOHEXOL 300 MG/ML  SOLN  COMPARISON:  None.  FINDINGS: CT CHEST FINDINGS  Mediastinum/Lymph Nodes: No masses or pathologically enlarged lymph nodes identified. Small less than 1 cm axillary lymph nodes are seen bilaterally with preservation of fatty hila. Small hiatal hernia noted.  Lungs/Pleura: No pulmonary infiltrate or mass identified. No effusion present.  Musculoskeletal/Soft Tissues: No suspicious bone lesions. 2 cm spiculated mass seen in retroareolar region of left breast, consistent with known primary breast carcinoma.  CT ABDOMEN AND PELVIS FINDINGS  Hepatobiliary: No masses or other significant abnormality identified.  Pancreas: No mass, inflammatory changes, or other significant abnormality identified.  Spleen:  Within normal limits in size and appearance.  Adrenals:  No masses identified.  Kidneys/Urinary Tract: No evidence of masses or hydronephrosis. Tiny sub-cm low-attenuation  lesions noted in both kidneys which are too small to characterize, but likely represent tiny cysts.  Stomach/Bowel/Peritoneum: No evidence of wall thickening, mass, or obstruction.  Vascular/Lymphatic: No pathologically enlarged lymph nodes identified. No abdominal aortic aneurysm or other significant retroperitoneal abnormality demonstrated.  Reproductive:  No mass or other significant abnormality identified.  Other:  None.  Musculoskeletal:  No suspicious bone lesions identified.  IMPRESSION: 2 cm left breast mass, consistent with known primary breast carcinoma. No evidence of metastatic disease within the chest, abdomen, or pelvis.  Small hiatal hernia incidentally noted.   Electronically Signed   By: Earle Gell M.D.   On: 08/31/2014 12:22  Nm Bone Scan Whole Body  08/31/2014   CLINICAL DATA:  History of left breast malignancy, remote history of bilateral ankle fractures and bilateral bunionectomy, Port-A-Cath appliance placed on August 28, 2014  EXAM: NUCLEAR MEDICINE WHOLE BODY BONE SCAN  TECHNIQUE: Whole body anterior and posterior images were obtained approximately 3 hours after intravenous injection of radiopharmaceutical.  RADIOPHARMACEUTICALS:  27.2 mCi mCi Technetium-59m MDP IV  COMPARISON:  CT scan of the chest, abdomen, and pelvis of August twenty-ninth 2016  FINDINGS: There is adequate uptake of the radiopharmaceutical by the skeleton. There is adequate soft tissue clearance and renal activity.  Activity over the calvarium is normal. There is a focus of increased uptake in the posterior aspect of the T9 vertebral level. There is also increased uptake posteriorly at L4. There is no abnormal rib uptake.  Uptake within the pectoral and pelvic girdles is consistent with degenerative change.  IMPRESSION: 1. Tiny focus of increased uptake on the right at approximately T8. No abnormalities demonstrated on the coronal or sagittal images through the thoracic spine on the previous CT scan. Mild bilateral  increased uptake at approximately T4 that is likely degenerative. 2. No abnormal uptake observed elsewhere.   Electronically Signed   By: David  Martinique M.D.   On: 08/31/2014 12:53   Ct Abdomen Pelvis W Contrast  08/31/2014   CLINICAL DATA:  Newly diagnosed left upper inner quadrant breast carcinoma.  EXAM: CT CHEST, ABDOMEN, AND PELVIS WITH CONTRAST  TECHNIQUE: Multidetector CT imaging of the chest, abdomen and pelvis was performed following the standard protocol during bolus administration of intravenous contrast.  CONTRAST:  128mL OMNIPAQUE IOHEXOL 300 MG/ML  SOLN  COMPARISON:  None.  FINDINGS: CT CHEST FINDINGS  Mediastinum/Lymph Nodes: No masses or pathologically enlarged lymph nodes identified. Small less than 1 cm axillary lymph nodes are seen bilaterally with preservation of fatty hila. Small hiatal hernia noted.  Lungs/Pleura: No pulmonary infiltrate or mass identified. No effusion present.  Musculoskeletal/Soft Tissues: No suspicious bone lesions. 2 cm spiculated mass seen in retroareolar region of left breast, consistent with known primary breast carcinoma.  CT ABDOMEN AND PELVIS FINDINGS  Hepatobiliary: No masses or other significant abnormality identified.  Pancreas: No mass, inflammatory changes, or other significant abnormality identified.  Spleen:  Within normal limits in size and appearance.  Adrenals:  No masses identified.  Kidneys/Urinary Tract: No evidence of masses or hydronephrosis. Tiny sub-cm low-attenuation lesions noted in both kidneys which are too small to characterize, but likely represent tiny cysts.  Stomach/Bowel/Peritoneum: No evidence of wall thickening, mass, or obstruction.  Vascular/Lymphatic: No pathologically enlarged lymph nodes identified. No abdominal aortic aneurysm or other significant retroperitoneal abnormality demonstrated.  Reproductive:  No mass or other significant abnormality identified.  Other:  None.  Musculoskeletal:  No suspicious bone lesions identified.   IMPRESSION: 2 cm left breast mass, consistent with known primary breast carcinoma. No evidence of metastatic disease within the chest, abdomen, or pelvis.  Small hiatal hernia incidentally noted.   Electronically Signed   By: Earle Gell M.D.   On: 08/31/2014 12:22   Mr Breast Bilateral W Wo Contrast  08/25/2014   CLINICAL DATA:  Patient felt a lump 1 month ago. Ultrasound-guided core biopsy of left breast mass 930 o'clock location demonstrated grade 1 invasive ductal carcinoma. Additionally ultrasound-guided core biopsy of enlarged left axillary lymph node showed metastatic disease. Adjacent to the known malignancy there is a suspicious group of calcifications in the left breast.  LABS:  Creatinine were obtained on  site at [place] at  [address]  Results: Creatinine 1.2 mg/dL.  GFR 56  EXAM: BILATERAL BREAST MRI WITH AND WITHOUT CONTRAST  TECHNIQUE: Multiplanar, multisequence MR images of both breasts were obtained prior to and following the intravenous administration of 18 ml of MultiHance.  THREE-DIMENSIONAL MR IMAGE RENDERING ON INDEPENDENT WORKSTATION:  Three-dimensional MR images were rendered by post-processing of the original MR data on an independent workstation. The three-dimensional MR images were interpreted, and findings are reported in the following complete MRI report for this study. Three dimensional images were evaluated at the independent DynaCad workstation  COMPARISON:  08/06/2014 and earlier  FINDINGS: Breast composition: b. Scattered fibroglandular tissue.  Background parenchymal enhancement: Minimal  Right breast: No mass or abnormal enhancement.  Left breast: There is an irregular mass immediately posterior to the left nipple. Mass demonstrates rapid persistent type enhancement kinetics and extends to the base of the nipple. The within the mass there is tissue marker clip artifact following recent ultrasound-guided core biopsy. Posterior to the mass there is clumped  linear non mass enhancement extending into the lower inner quadrant, suspicious for ductal carcinoma in situ. This enhancement correlates well with the suspicious microcalcifications seen mammographically. Mass and non mass enhancement measured together are 2.5 x 2.5 x 6.6 (anterior to posterior) cm. No additional sites of concern are identified within the left breast.  Lymph nodes: Enlarged lower left axillary lymph node is identified, associated with tissue marker clip following recent ultrasound-guided core biopsy. Additional normal morphology and left axillary lymph nodes are demonstrated. Right axillary lymph nodes and internal mammary chain are unremarkable in appearance.  Ancillary findings:  None.  IMPRESSION: 1. Mass and non mass enhancement in the retroareolar and lower inner quadrant of the left breast. Enhancement from the mass does appear to involve the base of the left nipple. The extent of disease is felt to be well demonstrated mammographically. 2. Enlarged left axillary lymph node, corresponding to known metastatic disease as identified on recent core biopsy. 3. Right breast is negative.  RECOMMENDATION: 1. Treatment plan. 2. If the patient is a candidate for breast conservation I would recommend bracketing of the posterior calcifications and known anterior malignancy at the time of lumpectomy.  BI-RADS CATEGORY  6: Known biopsy-proven malignancy.   Electronically Signed   By: Nolon Nations M.D.   On: 08/25/2014 11:27   Dg Chest Port 1 View  08/28/2014   CLINICAL DATA:  Status post chest wall port placement  EXAM: PORTABLE CHEST - 1 VIEW  COMPARISON:  03/16/2014  FINDINGS: Cardiac shadow is stable. The lungs are well aerated bilaterally. A new right-sided chest wall port is noted with the catheter tip in the mid to distal superior vena cava. No pneumothorax is noted.  IMPRESSION: No evidence of pneumothorax following port placement   Electronically Signed   By: Inez Catalina M.D.   On:  08/28/2014 12:14   Dg Fluoro Guide Cv Line-no Report  08/28/2014   CLINICAL DATA:    FLOURO GUIDE CV LINE  Fluoroscopy was utilized by the requesting physician.  No radiographic  interpretation.       IMPRESSION/PLAN: Lumi Winslett is a 59 year old female presenting to clinic in regards to her cancer of the left breast. The patient understands the benefits, risks, and purpose of radiation therapy were reviewed in detail, including statistics from reputable studies. The patient was educated on common symptoms to expect during radiation treatment and healthy methods of management to address  these symptoms if they are to occur. The patient was educated on the traditional process she will experience during radiation therapy treatments, including the purpose and definition of a CT scan, simulation planning session, and the appropriate time frame to set aside daily for treatments. The breath hold technique was briefly described. She was sent home with additional educational resources that she can access via the Internet. She is aware of her CT scan and simulation planning session to take place as scheduled. In regards to the specific area in which the patient is being treated, symptoms of dry skin "like a sunburn" were explained and she understands that she can treat this with provided cream. If she is ever in need of more cream at any point during her treatment in the future, it will be provided. She was also informed of additional resources such as physical therapy, in which will be provided upon her request. Moving forward, she is aware of the importance of completing annual mammograms, especially in regards to the successful management of her disease. She is aware of her follow-up appointment with radiation oncology to take place as scheduled and to notify Dr. Isidore Moos if any symptoms of concern occur during treatment. All vocalized questions and concerns have been addressed. She additionally understands that before  starting radiation therapy treatments, an appropriate time frame (at least 4 weeks) to allow her body to appropriatley recover and heal from her surgery and chemotherapy needs to take place. If the patient develops any further questions or concerns in regards to her treatment and recovery, she has been encouraged to contact Dr. Isidore Moos. Consent documentation has been reviewed and signed.  Anticipate 6 weeks of RT to the left breast region and regional nodes to reduce risk of recurrence in these areas by 2/3.  I will await her referral back to me by her surgeon when ready for RT planning.   This document serves as a record of services personally performed by Eppie Gibson, MD. It was created on her behalf by Lenn Cal, a trained medical scribe. The creation of this record is based on the scribe's personal observations and the provider's statements to them. This document has been checked and approved by the attending provider. __________________________________________   Eppie Gibson, MD

## 2014-09-11 NOTE — Addendum Note (Signed)
Encounter addended by: Jacqulyn Liner, RN on: 09/11/2014 10:05 AM<BR>     Documentation filed: Notes Section, Vitals Section

## 2014-09-11 NOTE — Progress Notes (Signed)
Patient reports she is feeling much better.  Advised her to sip fluids at home to keep hydrated.  Also advised her that she can come back to see Selena Lesser, FNP today if she starts to feel bad again.  Patient verbalized agreement and understand.  She was escorted out of the clinic by her daughter.

## 2014-09-12 ENCOUNTER — Ambulatory Visit: Payer: Federal, State, Local not specified - PPO

## 2014-09-16 ENCOUNTER — Other Ambulatory Visit: Payer: Self-pay

## 2014-09-16 MED ORDER — AMLODIPINE BESYLATE 10 MG PO TABS: 10.0000 mg | ORAL_TABLET | Freq: Every day | ORAL | Status: DC

## 2014-09-16 MED ORDER — ISOSORBIDE MONONITRATE ER 30 MG PO TB24: 30.0000 mg | ORAL_TABLET | Freq: Every day | ORAL | Status: DC

## 2014-09-16 MED ORDER — IRBESARTAN 300 MG PO TABS: 300.0000 mg | ORAL_TABLET | Freq: Every day | ORAL | Status: DC

## 2014-09-17 ENCOUNTER — Ambulatory Visit: Payer: Federal, State, Local not specified - PPO | Admitting: Nurse Practitioner

## 2014-09-17 ENCOUNTER — Other Ambulatory Visit (HOSPITAL_BASED_OUTPATIENT_CLINIC_OR_DEPARTMENT_OTHER): Payer: Federal, State, Local not specified - PPO

## 2014-09-17 ENCOUNTER — Ambulatory Visit (HOSPITAL_BASED_OUTPATIENT_CLINIC_OR_DEPARTMENT_OTHER): Payer: Federal, State, Local not specified - PPO

## 2014-09-17 ENCOUNTER — Encounter: Payer: Self-pay | Admitting: *Deleted

## 2014-09-17 ENCOUNTER — Ambulatory Visit (HOSPITAL_BASED_OUTPATIENT_CLINIC_OR_DEPARTMENT_OTHER): Payer: Federal, State, Local not specified - PPO | Admitting: Nurse Practitioner

## 2014-09-17 ENCOUNTER — Other Ambulatory Visit: Payer: Federal, State, Local not specified - PPO

## 2014-09-17 ENCOUNTER — Telehealth: Payer: Self-pay | Admitting: Hematology

## 2014-09-17 ENCOUNTER — Other Ambulatory Visit: Payer: Self-pay | Admitting: *Deleted

## 2014-09-17 ENCOUNTER — Telehealth: Payer: Self-pay | Admitting: *Deleted

## 2014-09-17 VITALS — BP 125/85 | HR 77 | Temp 98.4°F | Resp 17 | Ht 67.0 in | Wt 194.3 lb

## 2014-09-17 DIAGNOSIS — Z5189 Encounter for other specified aftercare: Secondary | ICD-10-CM | POA: Diagnosis not present

## 2014-09-17 DIAGNOSIS — Z5111 Encounter for antineoplastic chemotherapy: Secondary | ICD-10-CM

## 2014-09-17 DIAGNOSIS — C773 Secondary and unspecified malignant neoplasm of axilla and upper limb lymph nodes: Secondary | ICD-10-CM | POA: Diagnosis not present

## 2014-09-17 DIAGNOSIS — C50212 Malignant neoplasm of upper-inner quadrant of left female breast: Secondary | ICD-10-CM

## 2014-09-17 LAB — CBC WITH DIFFERENTIAL/PLATELET
BASO%: 0.5 % (ref 0.0–2.0)
Basophils Absolute: 0 10*3/uL (ref 0.0–0.1)
EOS%: 0.3 % (ref 0.0–7.0)
Eosinophils Absolute: 0 10*3/uL (ref 0.0–0.5)
HCT: 34.3 % — ABNORMAL LOW (ref 34.8–46.6)
HGB: 11.1 g/dL — ABNORMAL LOW (ref 11.6–15.9)
LYMPH%: 19.8 % (ref 14.0–49.7)
MCH: 29.1 pg (ref 25.1–34.0)
MCHC: 32.4 g/dL (ref 31.5–36.0)
MCV: 89.8 fL (ref 79.5–101.0)
MONO#: 0.6 10*3/uL (ref 0.1–0.9)
MONO%: 8.7 % (ref 0.0–14.0)
NEUT%: 70.7 % (ref 38.4–76.8)
NEUTROS ABS: 4.7 10*3/uL (ref 1.5–6.5)
PLATELETS: 180 10*3/uL (ref 145–400)
RBC: 3.82 10*6/uL (ref 3.70–5.45)
RDW: 14 % (ref 11.2–14.5)
WBC: 6.6 10*3/uL (ref 3.9–10.3)
lymph#: 1.3 10*3/uL (ref 0.9–3.3)

## 2014-09-17 LAB — COMPREHENSIVE METABOLIC PANEL (CC13)
ALT: 10 U/L (ref 0–55)
ANION GAP: 6 meq/L (ref 3–11)
AST: 11 U/L (ref 5–34)
Albumin: 3.6 g/dL (ref 3.5–5.0)
Alkaline Phosphatase: 87 U/L (ref 40–150)
BILIRUBIN TOTAL: 0.2 mg/dL (ref 0.20–1.20)
BUN: 15.6 mg/dL (ref 7.0–26.0)
CHLORIDE: 110 meq/L — AB (ref 98–109)
CO2: 28 meq/L (ref 22–29)
Calcium: 10.3 mg/dL (ref 8.4–10.4)
Creatinine: 1.1 mg/dL (ref 0.6–1.1)
EGFR: 64 mL/min/{1.73_m2} — AB (ref >90)
GLUCOSE: 101 mg/dL (ref 70–140)
Potassium: 4 mEq/L (ref 3.5–5.1)
SODIUM: 144 meq/L (ref 136–145)
TOTAL PROTEIN: 6.5 g/dL (ref 6.4–8.3)

## 2014-09-17 MED ORDER — SODIUM CHLORIDE 0.9 % IJ SOLN
10.0000 mL | INTRAMUSCULAR | Status: DC | PRN
  Administered 2014-09-17: 10 mL
  Filled 2014-09-17: qty 10

## 2014-09-17 MED ORDER — HEPARIN SOD (PORK) LOCK FLUSH 100 UNIT/ML IV SOLN
500.0000 [IU] | Freq: Once | INTRAVENOUS | Status: AC | PRN
  Administered 2014-09-17: 500 [IU]
  Filled 2014-09-17: qty 5

## 2014-09-17 MED ORDER — PEGFILGRASTIM 6 MG/0.6ML ~~LOC~~ PSKT
6.0000 mg | PREFILLED_SYRINGE | Freq: Once | SUBCUTANEOUS | Status: AC
  Administered 2014-09-17: 6 mg via SUBCUTANEOUS
  Filled 2014-09-17: qty 0.6

## 2014-09-17 MED ORDER — SODIUM CHLORIDE 0.9 % IV SOLN
Freq: Once | INTRAVENOUS | Status: AC
  Administered 2014-09-17: 13:00:00 via INTRAVENOUS

## 2014-09-17 MED ORDER — PALONOSETRON HCL INJECTION 0.25 MG/5ML
0.2500 mg | Freq: Once | INTRAVENOUS | Status: AC
  Administered 2014-09-17: 0.25 mg via INTRAVENOUS

## 2014-09-17 MED ORDER — PALONOSETRON HCL INJECTION 0.25 MG/5ML
INTRAVENOUS | Status: AC
  Filled 2014-09-17: qty 5

## 2014-09-17 MED ORDER — SODIUM CHLORIDE 0.9 % IV SOLN
600.0000 mg/m2 | Freq: Once | INTRAVENOUS | Status: AC
  Administered 2014-09-17: 1240 mg via INTRAVENOUS
  Filled 2014-09-17: qty 62

## 2014-09-17 MED ORDER — DOXORUBICIN HCL CHEMO IV INJECTION 2 MG/ML
60.0000 mg/m2 | Freq: Once | INTRAVENOUS | Status: AC
  Administered 2014-09-17: 124 mg via INTRAVENOUS
  Filled 2014-09-17: qty 62

## 2014-09-17 MED ORDER — SODIUM CHLORIDE 0.9 % IV SOLN
Freq: Once | INTRAVENOUS | Status: AC
  Administered 2014-09-17: 13:00:00 via INTRAVENOUS
  Filled 2014-09-17: qty 5

## 2014-09-17 NOTE — Patient Instructions (Signed)
[facility] Discharge Instructions for Patients Receiving Chemotherapy  Today you received the following chemotherapy agents Adriamycin/ cytoxan To help prevent nausea and vomiting after your treatment, we encourage you to take your nausea medication as directed   If you develop nausea and vomiting that is not controlled by your nausea medication, call the clinic.   BELOW ARE SYMPTOMS THAT SHOULD BE REPORTED IMMEDIATELY:  *FEVER GREATER THAN 100.5 F  *CHILLS WITH OR WITHOUT FEVER  NAUSEA AND VOMITING THAT IS NOT CONTROLLED WITH YOUR NAUSEA MEDICATION  *UNUSUAL SHORTNESS OF BREATH  *UNUSUAL BRUISING OR BLEEDING  TENDERNESS IN MOUTH AND THROAT WITH OR WITHOUT PRESENCE OF ULCERS  *URINARY PROBLEMS  *BOWEL PROBLEMS  UNUSUAL RASH Items with * indicate a potential emergency and should be followed up as soon as possible.  Feel free to call the clinic you have any questions or concerns. The clinic phone number is [phone].  Please show the [facility] at check-in to the Emergency Department and triage nurse.

## 2014-09-17 NOTE — Progress Notes (Signed)
Met with pt during cycle 2 AC. Relate doing well and without complaints. Encourage pt to call with questions or needs. Received verbal understanding.

## 2014-09-17 NOTE — Telephone Encounter (Signed)
Per staff message and POF I have scheduled appts. Advised scheduler of appts. JMW  

## 2014-09-17 NOTE — Progress Notes (Signed)
  [facility] OFFICE PROGRESS NOTE   Diagnosis:  Left breast cancer Oncology History   Breast cancer of upper-inner quadrant of left female breast  Staging form: Breast, AJCC 7th Edition  Clinical: Stage IIIA (T3, N1, M0) - Unsigned       Breast cancer of upper-inner quadrant of left female breast   08/06/2014 Initial Diagnosis Breast cancer of upper-inner quadrant of left female breast   08/06/2014 Initial Biopsy Left breast 9:30 o'clock position showed invasive ductal carcinoma, and 1 left axillary lymph node biopsy showed positive for metastatic carcinoma.   08/06/2014 Receptors her2 ER 100% positive, PR 5% positive, Ki-67 50%   08/06/2014 Mammogram Diagnostic mammogram and ultrasound showed left breast mass with associated calcification measuring 5.5 cm, indeterminate 8 left axillary lymph node was cortical thickening, circumscribed low density oval 71m mass at 12:00 within the left breast.        INTERVAL HISTORY:   Ms. KGrimleyreturns as scheduled. She completed cycle 1 Adriamycin/Cytoxan 09/03/2014. She developed nausea around day 4. The nausea lasted 1 week. Compazine was not effective. She had Ativan and Zofran but did not take these initially. After many day she tried the Zofran with good results. No mouth sores. She has loose stools one time a day. No rash. No hand or foot pain or redness. She noted lower back pain following Neulasta.  Objective:  Vital signs in last 24 hours:  Blood pressure 125/85, pulse 77, temperature 98.4 F (36.9 C), temperature source Oral, resp. rate 17, height _0  (1.702 m), weight 194 lb 4.8 oz (88.134 kg), SpO2 99 %.    HEENT: No thrush or ulcers. Lymphatics: No palpable cervical or supraclavicular lymph nodes. Resp: Lungs clear bilaterally. Cardio: Regular rate and rhythm. GI: Abdomen soft and nontender. No organomegaly. Vascular: No leg edema. Calves soft and nontender. Breasts: Approximate 2 cm mass behind  the left nipple.  Skin: No rash. Port-A-Cath without erythema.    Lab Results:  Lab Results  Component Value Date   WBC 5.1 09/03/2014   HGB 12.6 09/03/2014   HCT 38.0 09/03/2014   MCV 87.8 09/03/2014   PLT 211 09/03/2014   NEUTROABS 3.1 09/03/2014    Imaging:  No results found.  Medications: I have reviewed the patient's current medications.  Assessment/Plan: 1. Left breast invasive ductal carcinoma, cT3N1M0, stage IIIA, ER+/PR+/HER2- -the plan is for neoadjuvant dose dense Adriamycin/Cytoxan every 2 weeks for 4 cycles followed by weekly Taxol for 12 weeks; adjuvant endocrine therapy with aromatase inhibitor after surgery and radiation. -Dose dense Adriamycin/Cytoxan initiated 09/03/2014 2. Cancer genetics She has very strong family history of breast, colon and pancreatic cancer, all in first-degree. She has been referred to genetics.   3. History of non-STEMI in 03/2014 -She had cardiac catheterization, which showed mild diffuse coronary artery disease, but no need for intervention.  -She will follow-up with her cardiologist Dr. CBurt Knack-2-D echo 08/24/2014 with estimated ejection fraction in the range of 55-60%.     Disposition: Ms. KDinkelappears stable. She has completed 1 cycle of dose dense Adriamycin/Cytoxan. Plan to proceed with cycle 2 today as scheduled.  She will return for a follow-up visit and cycle 3 in 2 weeks. She will contact the office in the interim with any problems. We specifically discussed poorly controlled nausea.    TNed CardANP/GNP-BC   09/17/2014  10:58 AM

## 2014-09-17 NOTE — Telephone Encounter (Signed)
Gave adn printed appt sched and avs for pt for Sept adn OCT °

## 2014-09-18 ENCOUNTER — Ambulatory Visit: Payer: Federal, State, Local not specified - PPO

## 2014-09-23 ENCOUNTER — Encounter: Payer: Self-pay | Admitting: Genetic Counselor

## 2014-09-23 ENCOUNTER — Other Ambulatory Visit: Payer: Federal, State, Local not specified - PPO

## 2014-09-23 ENCOUNTER — Ambulatory Visit: Payer: Federal, State, Local not specified - PPO

## 2014-09-23 ENCOUNTER — Ambulatory Visit (HOSPITAL_BASED_OUTPATIENT_CLINIC_OR_DEPARTMENT_OTHER): Payer: Federal, State, Local not specified - PPO | Admitting: Genetic Counselor

## 2014-09-23 DIAGNOSIS — C50212 Malignant neoplasm of upper-inner quadrant of left female breast: Secondary | ICD-10-CM

## 2014-09-23 DIAGNOSIS — Z803 Family history of malignant neoplasm of breast: Secondary | ICD-10-CM | POA: Diagnosis not present

## 2014-09-23 DIAGNOSIS — Z800 Family history of malignant neoplasm of digestive organs: Secondary | ICD-10-CM

## 2014-09-23 DIAGNOSIS — Z315 Encounter for genetic counseling: Secondary | ICD-10-CM | POA: Diagnosis not present

## 2014-09-23 NOTE — Progress Notes (Signed)
REFERRING PROVIDER: Katherina Mires, MD [place], [place] [address]   Truitt Merle, MD  PRIMARY PROVIDER:  Suzanna Obey, MD  PRIMARY REASON FOR VISIT:  1. Breast cancer of upper-inner quadrant of left female breast   2. Family history of breast cancer   3. Family history of pancreatic cancer      HISTORY OF PRESENT ILLNESS:   Mary Randall, a 59 y.o. female, was seen for a [facility] cancer genetics consultation at the request of Dr. Burr Medico due to a personal and family history of cancer.  Mary Randall presents to clinic today to discuss the possibility of a hereditary predisposition to cancer, genetic testing, and to further clarify her future cancer risks, as well as potential cancer risks for family members.   In 2016, at the age of 54, Mary Randall was diagnosed with invasive ductal carcinom of the left breast.  ER+/PR+/Her2-. This was treated with chemo, surgery and radiation, following up with tamoxifen.    CANCER HISTORY:  Oncology History   Breast cancer of upper-inner quadrant of left female breast   Staging form: Breast, AJCC 7th Edition     Clinical: Stage IIIA (T3, N1, M0) - Unsigned       Breast cancer of upper-inner quadrant of left female breast   08/06/2014 Initial Diagnosis Breast cancer of upper-inner quadrant of left female breast   08/06/2014 Initial Biopsy Left breast 9:30 o'clock position showed invasive ductal carcinoma, and 1 left axillary lymph node biopsy showed positive for metastatic carcinoma.   08/06/2014 Receptors her2 ER 100% positive, PR 5% positive, Ki-67 50%   08/06/2014 Mammogram Diagnostic mammogram and ultrasound showed left breast mass with associated calcification measuring 5.5 cm, indeterminate 8 left axillary lymph node was cortical thickening, circumscribed low density oval 48m mass at 12:00 within the left breast.     HORMONAL RISK FACTORS:  Menarche was at age 59  First live birth at age 59  OCP use for approximately 3 years.   Ovaries intact: yes.  Hysterectomy: no.  Menopausal status: postmenopausal.  HRT use: 0 years. Colonoscopy: no; not examined. Mammogram within the last year: yes. Number of breast biopsies: 1. Up to date with pelvic exams:  yes. Any excessive radiation exposure in the past:  no  Past Medical History  Diagnosis Date  . Hypertension   . Angina effort   . NSTEMI (non-ST elevated myocardial infarction) 03/16/2014    non-obs CAD, spasm seen  . Pneumonia 2009  . Arthritis     "right knee" (03/16/2014)  . 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 Breast cancer 2016  . Family history of breast cancer   . Family history of pancreatic cancer   . Family history of breast cancer in female     Past Surgical History  Procedure Laterality Date  . Other surgical history  1991    Tubal pregnancy  . Cardiac catheterization  2010; 03/16/2014  . Tubal ligation  1981  . Left heart catheterization with coronary angiogram N/A 03/16/2014    Procedure: LEFT HEART CATHETERIZATION WITH CORONARY ANGIOGRAM;  Surgeon: MSherren Mocha MD; Non-obs CAD, EF 55%  . Bunionectomy Bilateral   . Breast biopsy Left 08/06/14  . Portacath placement N/A 08/28/2014    Procedure: INSERTION PORT-A-CATH;  Surgeon: PAutumn MessingIII, MD;  Location: [facility]  Service: General;  Laterality: N/A;    Social History   Social History  . Marital Status: Legally Separated  Spouse Name: N/A  . Number of Children: N/A  . Years of Education: N/A   Social History Main Topics  . Smoking status: Never Smoker   . Smokeless tobacco: Never Used  . Alcohol Use: No  . Drug Use: No  . Sexual Activity: No   Other Topics Concern  . Not on file   Social History Narrative     FAMILY HISTORY:  We obtained a detailed, 4-generation family history.  Significant diagnoses are listed below: Family History  Problem Relation Age of Onset  . Pancreatic cancer Brother 55  . Cancer Brother 40    breast cancer   . Cancer  Mother 82    colon cancer   . Hypertension Mother   . Diabetes type II Mother   . CVA Father   . Healthy Sister   . Healthy Sister    The patient has two children, and four brothers and two sisters.  One brother was diagnosed with breast cancer at age 27, another was diagnosed with pancreatic cancer at 68.  The remaining siblings are healthy.  Her mother was diagnosed with colon cancer at 68 and her father died of a stroke at 57.  Her mother had five sisters and three brothers.  One brother died with prostate cancer and one sister had a daughter with breast cancer in her 49s-30s.  Mary Randall maternal grandmother was diagnosed with leukemia in her 70s.  Patient's maternal ancestors are of [place] American descent, and paternal ancestors are of [place] and [place] descent. There is no reported Ashkenazi Jewish ancestry. There is no known consanguinity.  GENETIC COUNSELING ASSESSMENT: Mary Randall is a 59 y.o. female with a personal history of breast cancer and family history of breast, pancreatic and prostate cancer which somewhat suggestive of a hereditary cancer syndrome and predisposition to cancer. We, therefore, discussed and recommended the following at today's visit.   DISCUSSION: We discussed that based on her personal and family history we were most concerned about hereditary cancer syndromes that are associated with female breast cancer.  This would include BRCA1, BRCA2, PALB2 and CHEK2 mutations.  Female breast cancer is less common, as about 1 in 1000 men develop breast cancer.  Of these, literature states that about 3/4 of these cases have a hereditary cancer syndrome.  Most commonly, associated with BRCA2 mutations.  We reviewed the characteristics, features and inheritance patterns of hereditary cancer syndromes. We also discussed genetic testing, including the appropriate family members to test, the process of testing, insurance coverage and turn-around-time for results. We discussed the  implications of a negative, positive and/or variant of uncertain significant result. We recommended Mary Randall pursue genetic testing for the Breast/Ovarian cancer gene panel. The Breast/Ovarian gene panel offered by [facility] includes sequencing and rearrangement analysis for the following 20 genes:  ATM, BARD1, BRCA1, BRCA2, BRIP1, CDH1, CHEK2, EPCAM, FANCC, MLH1, MSH2, MSH6, NBN, PALB2, PMS2, PTEN, RAD51C, RAD51D, TP53, and XRCC2.     Based on Mary Randall personal and family history of cancer, she meets medical criteria for genetic testing. Despite that she meets criteria, she may still have an out of pocket cost. We discussed that if her out of pocket cost for testing is over $100, the laboratory will call and confirm whether she wants to proceed with testing.  If the out of pocket cost of testing is less than $100 she will be billed by the genetic testing laboratory.   PLAN: After considering the risks, benefits, and limitations, Ms.  Randall  provided informed consent to pursue genetic testing and the blood sample was sent to [facility] for analysis of the Breast/Ovarian cancer panel. Results should be available within approximately 2-3 weeks' time, at which point they will be disclosed by telephone to Mary Randall, as will any additional recommendations warranted by these results. Mary Randall will receive a summary of her genetic counseling visit and a copy of her results once available. This information will also be available in Epic. We encouraged Mary Randall to remain in contact with cancer genetics annually so that we can continuously update the family history and inform her of any changes in cancer genetics and testing that may be of benefit for her family. Mary Randall questions were answered to her satisfaction today. Our contact information was provided should additional questions or concerns arise.  Based on Mary Randall family history, we recommended her brother, who was diagnosed with breast cancer at age  46, have genetic counseling and testing. Mary Randall will let us know if we can be of any assistance in coordinating genetic counseling and/or testing for this family member.   Lastly, we encouraged Mary Randall to remain in contact with cancer genetics annually so that we can continuously update the family history and inform her of any changes in cancer genetics and testing that may be of benefit for this family.   Ms.  Randall questions were answered to her satisfaction today. Our contact information was provided should additional questions or concerns arise. Thank you for the referral and allowing [place] to share in the care of your patient.   Karen P. Florene Glen, [place], [facility] Certified Genetic Counselor [email or web address] phone: [phone]  The patient was seen for a total of 60 minutes in face-to-face genetic counseling.  This patient was discussed with Drs. Magrinat, Lindi Adie and/or Burr Medico who agrees with the above.    _______________________________________________________________________ For Office Staff:  Number of people involved in session: 2 Was an Intern/ student involved with case: no

## 2014-09-29 NOTE — Addendum Note (Signed)
Encounter addended by: Benn Moulder, RN on: 09/29/2014  3:49 PM<BR>     Documentation filed: Charges VN

## 2014-10-01 ENCOUNTER — Ambulatory Visit (HOSPITAL_BASED_OUTPATIENT_CLINIC_OR_DEPARTMENT_OTHER): Payer: Federal, State, Local not specified - PPO | Admitting: Hematology

## 2014-10-01 ENCOUNTER — Telehealth: Payer: Self-pay | Admitting: *Deleted

## 2014-10-01 ENCOUNTER — Ambulatory Visit: Payer: Federal, State, Local not specified - PPO

## 2014-10-01 ENCOUNTER — Encounter: Payer: Self-pay | Admitting: Hematology

## 2014-10-01 ENCOUNTER — Other Ambulatory Visit (HOSPITAL_BASED_OUTPATIENT_CLINIC_OR_DEPARTMENT_OTHER): Payer: Federal, State, Local not specified - PPO

## 2014-10-01 ENCOUNTER — Telehealth: Payer: Self-pay | Admitting: Hematology

## 2014-10-01 VITALS — BP 124/82 | HR 99 | Temp 98.5°F | Resp 17 | Ht 67.0 in | Wt 192.5 lb

## 2014-10-01 DIAGNOSIS — C50212 Malignant neoplasm of upper-inner quadrant of left female breast: Secondary | ICD-10-CM

## 2014-10-01 LAB — CBC WITH DIFFERENTIAL/PLATELET
BASO%: 1.4 % (ref 0.0–2.0)
Basophils Absolute: 0 10*3/uL (ref 0.0–0.1)
EOS ABS: 0 10*3/uL (ref 0.0–0.5)
EOS%: 1.4 % (ref 0.0–7.0)
HCT: 31 % — ABNORMAL LOW (ref 34.8–46.6)
HGB: 10.2 g/dL — ABNORMAL LOW (ref 11.6–15.9)
LYMPH%: 48.4 % (ref 14.0–49.7)
MCH: 29.4 pg (ref 25.1–34.0)
MCHC: 33 g/dL (ref 31.5–36.0)
MCV: 88.9 fL (ref 79.5–101.0)
MONO#: 0.3 10*3/uL (ref 0.1–0.9)
MONO%: 20.5 % — ABNORMAL HIGH (ref 0.0–14.0)
NEUT%: 28.3 % — ABNORMAL LOW (ref 38.4–76.8)
NEUTROS ABS: 0.5 10*3/uL — AB (ref 1.5–6.5)
PLATELETS: 225 10*3/uL (ref 145–400)
RBC: 3.48 10*6/uL — AB (ref 3.70–5.45)
RDW: 14.5 % (ref 11.2–14.5)
WBC: 1.6 10*3/uL — AB (ref 3.9–10.3)
lymph#: 0.8 10*3/uL — ABNORMAL LOW (ref 0.9–3.3)

## 2014-10-01 LAB — COMPREHENSIVE METABOLIC PANEL (CC13)
ALBUMIN: 3.4 g/dL — AB (ref 3.5–5.0)
ALK PHOS: 78 U/L (ref 40–150)
ALT: <9 U/L (ref 0–55)
ANION GAP: 9 meq/L (ref 3–11)
AST: 9 U/L (ref 5–34)
BILIRUBIN TOTAL: 0.34 mg/dL (ref 0.20–1.20)
BUN: 12 mg/dL (ref 7.0–26.0)
CALCIUM: 10.3 mg/dL (ref 8.4–10.4)
CO2: 26 meq/L (ref 22–29)
Chloride: 107 mEq/L (ref 98–109)
Creatinine: 1.1 mg/dL (ref 0.6–1.1)
EGFR: 62 mL/min/{1.73_m2} — AB (ref >90)
Glucose: 112 mg/dl (ref 70–140)
Potassium: 4.1 mEq/L (ref 3.5–5.1)
Sodium: 142 mEq/L (ref 136–145)
TOTAL PROTEIN: 6.4 g/dL (ref 6.4–8.3)

## 2014-10-01 MED ORDER — APREPITANT 80 MG PO CAPS: 80.0000 mg | ORAL_CAPSULE | Freq: Every day | ORAL | Status: DC

## 2014-10-01 MED ORDER — PROMETHAZINE HCL 12.5 MG PO TABS: 12.5000 mg | ORAL_TABLET | Freq: Four times a day (QID) | ORAL | Status: DC | PRN

## 2014-10-01 MED ORDER — ACYCLOVIR 400 MG PO TABS: 400.0000 mg | ORAL_TABLET | Freq: Three times a day (TID) | ORAL | Status: DC

## 2014-10-01 MED ORDER — LORAZEPAM 0.5 MG PO TABS: ORAL_TABLET | ORAL | Status: DC

## 2014-10-01 NOTE — Telephone Encounter (Signed)
Per staff message and POF I have scheduled appts. Advised scheduler of appts. JMW  

## 2014-10-01 NOTE — Progress Notes (Addendum)
[place]  Telephone:([phone]) [phone] [phone]) [phone]  Clinic follow up Note   Patient Care Team: Katherina Mires, MD as PCP - General (Family Medicine) Autumn Messing III, MD as Consulting Physician (General Surgery) Sherren Mocha, MD as Consulting Physician (Cardiology) 10/01/2014  CHIEF COMPLAINTS:  Follow up left breast cancer   Oncology History   Breast cancer of upper-inner quadrant of left female breast   Staging form: Breast, AJCC 7th Edition     Clinical: Stage IIIA (T3, N1, M0) - Unsigned       Breast cancer of upper-inner quadrant of left female breast   08/06/2014 Initial Diagnosis Breast cancer of upper-inner quadrant of left female breast   08/06/2014 Initial Biopsy Left breast 9:30 o'clock position showed invasive ductal carcinoma, and 1 left axillary lymph node biopsy showed positive for metastatic carcinoma.   08/06/2014 Receptors her2 ER 100% positive, PR 5% positive, Ki-67 50%   08/06/2014 Mammogram Diagnostic mammogram and ultrasound showed left breast mass with associated calcification measuring 5.5 cm, indeterminate 8 left axillary lymph node was cortical thickening, circumscribed low density oval 29mm mass at 12:00 within the left breast.   09/03/2014 -  Adjuvant Chemotherapy ddAC every 2 weeks X4, followed by weekly Taxol X12     HISTORY OF PRESENTING ILLNESS:  Mary Randall 59 y.o. female is here because of recently newly diagnosed left breast cancer. She presents to the clinic with her daughter.  This was discovered by screening mammogram. Her prior mammogram was 2 years ago. She noticed mild left nipple retraction, no palpable mass, no other changes. She feels well overall, denies any significant pain except mild right knee pain from arthritis. She denies any change of her appetite, or recent weight loss. She works for post office, Horticulturist, commercial, and is physically active.   Her family history is notable for breast cancer in her brother, pancreatic  cancer in another brother, and colon cancer in her mother. She is postmenopausal, last menstrual period 8 years ago. She has a daughter and a son. She lives with her brother, and her daughter is likely going to stay with her.  INTERIM HISTORY Mary Randall returns for follow up and chemo. She had a moderate and persistent nausea for about 12 days since her last cycle chemotherapy, vomited a few times. She did not feel Zofran or Compazine wasn't helpful, and she takes Ativan as needed for nausea. Her nausea resolved 2 days ago and she feels better now. She was able to keep fluids and food down, no significant weight loss. She also reports skin blister and ulcers above her vagina area. No pain. She also has moderate fatigue, able to light activities, No fever or chills or other complaints.   MEDICAL HISTORY:  Past Medical History  Diagnosis Date  . Hypertension   . Angina effort   . NSTEMI (non-ST elevated myocardial infarction) 03/16/2014    non-obs CAD, spasm seen  . Pneumonia 2009  . Arthritis     "right knee" (03/16/2014)  . 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 Breast cancer 2016  . Family history of breast cancer   . Family history of pancreatic cancer   . Family history of breast cancer in female     SURGICAL HISTORY: Past Surgical History  Procedure Laterality Date  . Other surgical history  1991    Tubal pregnancy  . Cardiac catheterization  2010; 03/16/2014  . Tubal ligation  1981  . Left heart catheterization with  coronary angiogram N/A 03/16/2014    Procedure: LEFT HEART CATHETERIZATION WITH CORONARY ANGIOGRAM;  Surgeon: Sherren Mocha, MD; Non-obs CAD, EF 55%  . Bunionectomy Bilateral   . Breast biopsy Left 08/06/14  . Portacath placement N/A 08/28/2014    Procedure: INSERTION PORT-A-CATH;  Surgeon: Autumn Messing III, MD;  Location: [facility];  Service: General;  Laterality: N/A;   GYN HISTORY  Menarchal: 15 LMP: 51 Contraceptive: 3 HRT: no G2P2: no breast feeding      SOCIAL HISTORY: Social History   Social History  . Marital Status: Legally Separated    Spouse Name: N/A  . Number of Children: Daughter 18, son 71   . Years of Education: N/A   Occupational History  . Works for post office    Social History Main Topics  . Smoking status: Never Smoker   . Smokeless tobacco: Never Used  . Alcohol Use: No  . Drug Use: No  . Sexual Activity: No   Other Topics Concern  . Not on file   Social History Narrative    FAMILY HISTORY: Family History  Problem Relation Age of Onset  . Pancreatic cancer Brother 61  . Cancer Brother 40    breast cancer   . Cancer Mother 73    colon cancer   . Hypertension Mother   . Diabetes type II Mother   . CVA Father   . Healthy Sister   . Healthy Sister     ALLERGIES:  is allergic to codeine; carvedilol; and percocet.  MEDICATIONS:  Current Outpatient Prescriptions  Medication Sig Dispense Refill  . amLODipine (NORVASC) 10 MG tablet Take 1 tablet (10 mg total) by mouth daily. 30 tablet 3  . aspirin EC 81 MG tablet Take 81 mg by mouth daily.    Marland Kitchen atorvastatin (LIPITOR) 40 MG tablet Take 1 tablet (40 mg total) by mouth daily at 6 PM. 30 tablet 11  . dexamethasone (DECADRON) 4 MG tablet Take 2 pills once the day after chemotherapy and then 2 times a day for 2 days. Take with food 30 tablet 1  . hydrochlorothiazide (HYDRODIURIL) 25 MG tablet Take 25 mg by mouth daily.    . irbesartan (AVAPRO) 300 MG tablet Take 1 tablet (300 mg total) by mouth daily. 30 tablet 2  . isosorbide mononitrate (IMDUR) 30 MG 24 hr tablet Take 1 tablet (30 mg total) by mouth daily. 30 tablet 3  . Lidocaine-Prilocaine, Bulk, 2.5-2.5 % CREA Apply 5 mLs topically as needed. 1.5 hours before use with chemotherapy.Cover with plastic 30 g 3  . LORazepam (ATIVAN) 0.5 MG tablet Take 1 tablet by mouth every 6 hours as needed for nausea or vomiting. 30 tablet 0  . ondansetron (ZOFRAN) 8 MG tablet Take 1 tablet (8 mg total) by mouth 2  (two) times daily as needed for nausea or vomiting. Start on 3rd day after Chemotherapy 30 tablet 1  . potassium chloride (K-DUR) 10 MEQ tablet Take 1 tablet (10 mEq total) by mouth daily. 30 tablet 11  . nitroGLYCERIN (NITROSTAT) 0.4 MG SL tablet Place 1 tablet (0.4 mg total) under the tongue every 5 (five) minutes as needed for chest pain. (Patient not taking: Reported on 08/31/2014) 25 tablet 12  . prochlorperazine (COMPAZINE) 10 MG tablet Take 1 tablet (10 mg total) by mouth every 6 (six) hours as needed for nausea or vomiting. (Patient not taking: Reported on 09/11/2014) 30 tablet 1   No current facility-administered medications for this visit.    REVIEW OF SYSTEMS:  Constitutional: Denies fevers, chills or abnormal night sweats Eyes: Denies blurriness of vision, double vision or watery eyes Ears, nose, mouth, throat, and face: Denies mucositis or sore throat Respiratory: Denies cough, dyspnea or wheezes Cardiovascular: Denies palpitation, chest discomfort or lower extremity swelling Gastrointestinal:  Denies nausea, heartburn or change in bowel habits Skin: Denies abnormal skin rashes Lymphatics: Denies new lymphadenopathy or easy bruising Neurological:Denies numbness, tingling or new weaknesses Behavioral/Psych: Mood is stable, no new changes  All other systems were reviewed with the patient and are negative.  PHYSICAL EXAMINATION: ECOG PERFORMANCE STATUS: 2  Filed Vitals:   10/01/14 0923  BP: 124/82  Pulse: 99  Temp: 98.5 F (36.9 C)  Resp: 17   Filed Weights   10/01/14 0923  Weight: 192 lb 8 oz (87.317 kg)    GENERAL:alert, no distress and comfortable SKIN: skin color, texture, turgor are normal, no rashes or significant lesions, except 2 Sherrill skin ulcers in the area Of her vagina.  EYES: normal, conjunctiva are pink and non-injected, sclera clear OROPHARYNX:no exudate, no erythema and lips, buccal mucosa, and tongue normal  NECK: supple, thyroid normal size,  non-tender, without nodularity LYMPH:  no palpable lymphadenopathy in the cervical, axillary or inguinal LUNGS: clear to auscultation and percussion with normal breathing effort HEART: regular rate & rhythm and no murmurs and no lower extremity edema ABDOMEN:abdomen soft, non-tender and normal bowel sounds Musculoskeletal:no cyanosis of digits and no clubbing  PSYCH: alert & oriented x 3 with fluent speech NEURO: no focal motor/sensory deficits Breasts: Breast inspection showed them to be symmetrical with no nipple discharge. Slight left nipple retraction. A 1.8cm (imnitially 2.5 cm) mass behind the nipple. Palpation of the right breasts and both axillas revealed no obvious mass that I could appreciate.   LABORATORY DATA:  I have reviewed the data as listed CBC Latest Ref Rng 10/01/2014 09/17/2014 09/03/2014  WBC 3.9 - 10.3 10e3/uL 1.6(L) 6.6 5.1  Hemoglobin 11.6 - 15.9 g/dL 10.2(L) 11.1(L) 12.6  Hematocrit 34.8 - 46.6 % 31.0(L) 34.3(L) 38.0  Platelets 145 - 400 10e3/uL 225 180 211    CMP Latest Ref Rng 09/17/2014 09/03/2014 08/25/2014  Glucose 70 - 140 mg/dl 101 140 93  BUN 7.0 - 26.0 mg/dL 15.6 19.0 10  Creatinine 0.6 - 1.1 mg/dL 1.1 1.1 1.05(H)  Sodium 136 - 145 mEq/L 144 139 139  Potassium 3.5 - 5.1 mEq/L 4.0 3.9 3.4(L)  Chloride 101 - 111 mmol/L - - 102  CO2 22 - 29 mEq/L $Remove'28 27 29  'WdljobT$ Calcium 8.4 - 10.4 mg/dL 10.3 10.6(H) 10.4(H)  Total Protein 6.4 - 8.3 g/dL 6.5 7.1 -  Total Bilirubin 0.20 - 1.20 mg/dL 0.20 0.70 -  Alkaline Phos 40 - 150 U/L 87 100 -  AST 5 - 34 U/L 11 11 -  ALT 0 - 55 U/L 10 10 -   ANC 0.5 today    PATHOLOGY REPORT:  Diagnosis 08/06/2014 1. Breast, left, needle core biopsy, 9:30 o'clock, 1 CMFN - INVASIVE DUCTAL CARCINOMA, SEE COMMENT. 2. Lymph node, needle/core biopsy, left axillary lymph node - ONE LYMPH NODE, POSITIVE FOR METASTATIC MAMMARY CARCINOMA (1/1) 1. Although the grade of tumor is best assessed at resection, with these biopsies, the invasive tumor is  grade 1. Breast prognostic studies are pending (part 1 only) and will be reported in an addendum. The case is reviewed with Dr. Avis Epley who concurs. (CRR:gt, 08/07/14)  Results: IMMUNOHISTOCHEMICAL AND MORPHOMETRIC ANALYSIS BY THE AUTOMATED CELLULAR IMAGING SYSTEM (ACIS) Estrogen Receptor: 100%, POSITIVE, STRONG  STAINING INTENSITY (PERFORMED MANUALLY) Progesterone Receptor: 5%, POSITIVE, WEAK STAINING INTENSITY Proliferation Marker Ki67: 50% (PERFORMED MANUALLY)  Results: HER2 - NEGATIVE RATIO OF HER2/CEP17 SIGNALS 1.47 AVERAGE HER2 COPY NUMBER PER CELL 2.35  RADIOGRAPHIC STUDIES: I have personally reviewed the radiological images as listed and agreed with the findings in the report.  US Breast Ltd Uni Left Inc Axilla 08/06/2014    FINDINGS: CC and MLO views of the left breast with tomosynthesis and CC and ML magnification views were performed. There is an approximately 2 cm irregular mass within the slightly medial, subareolar left breast. In addition, there are associated coarse heterogeneous calcifications. In total, the mass and calcifications span approximately 5.5 cm. A prominent lymph node is noted within the left axilla. Several adjacent oval, circumscribed low-density masses are noted within the left breast at 12 o'clock, middle depth.  Mammographic images were processed with CAD.  On physical exam, a palpable mass is noted within the left breast at 930, 1 cm from the nipple. No discrete mass is felt in the 12 o'clock region.  Targeted ultrasound is performed, showing an irregular hypoechoic mass at 930, 1 cm from the nipple measuring approximately 15 x 13 x 14 mm, corresponding to the palpable abnormality on exam. Dilated ducts extend from the mass to the level of the nipple, concerning for intraductal extension. The posterior extent of the calcifications seen mammographically is not well seen on ultrasound. At 12:30, 5 cm from the nipple, there is an oval, hypoechoic, circumscribed mass  measuring approximately 8 x 8 x 2 mm which is thought to correspond to the largest of the oval low-density masses seen mammographically at 12 o'clock. No internal vascularity is identified.  Targeted exam of the left axilla demonstrates an abnormal appearing lymph node with cortical thickening measuring up to 5 mm.    IMPRESSION: 1. Highly suspicious left breast mass with associated pleomorphic calcifications which together span approximately 5.5 cm. Sonographic findings are concerning for intraductal extension of carcinoma to the level of the nipple. 2. Indeterminate left axillary lymph node with cortical thickening. 3. Circumscribed, low-density oval masses at 12 o'clock within the left breast.  RECOMMENDATION: 1. Ultrasound-guided core biopsy of the left breast mass at 930 and of the left axillary lymph node with cortical thickening. If biopsy of the left breast mass at 930 demonstrates malignancy, breast MRI is recommended for further evaluation of nipple involvement. 2. The patient's outside prior mammograms will be obtained for comparison. If the low density oval masses seen within the left breast at 12 o'clock are unchanged in appearance, no further evaluation is recommended. An addendum will be made when the comparison films are available.  I have discussed the findings and recommendations with the patient. Results were also provided in writing at the conclusion of the visit. If applicable, a reminder letter will be sent to the patient regarding the next appointment.  BI-RADS CATEGORY  5: Highly suggestive of malignancy.   Electronically Signed   By: Pamelia Hoit M.D.   On: 08/06/2014 12:16   Breast MRI 08/25/2014 Right breast: No mass or abnormal enhancement.  Left breast: There is an irregular mass immediately posterior to the left nipple. Mass demonstrates rapid persistent type enhancement kinetics and extends to the base of the nipple. The within the mass there is tissue marker clip artifact  following recent ultrasound-guided core biopsy. Posterior to the mass there is clumped linear non mass enhancement extending into the lower inner quadrant, suspicious for ductal carcinoma in situ. This  enhancement correlates well with the suspicious microcalcifications seen mammographically. Mass and non mass enhancement measured together are 2.5 x 2.5 x 6.6 (anterior to posterior) cm. No additional sites of concern are identified within the left breast.  Lymph nodes: Enlarged lower left axillary lymph node is identified, associated with tissue marker clip following recent ultrasound-guided core biopsy. Additional normal morphology and left axillary lymph nodes are demonstrated. Right axillary lymph nodes and internal mammary chain are unremarkable in appearance.  Ancillary findings: None.  IMPRESSION: 1. Mass and non mass enhancement in the retroareolar and lower inner quadrant of the left breast. Enhancement from the mass does appear to involve the base of the left nipple. The extent of disease is felt to be well demonstrated mammographically. 2. Enlarged left axillary lymph node, corresponding to known metastatic disease as identified on recent core biopsy. 3. Right breast is negative.   Echo 08/24/2014 Study Conclusions  - Left ventricle: The cavity size was normal. Wall thickness was normal. Systolic function was normal. The estimated ejection fraction was in the range of 55% to 60%. - Left atrium: The atrium was mildly dilated.  Bone scan 08/31/2014 IMPRESSION: 1. Tiny focus of increased uptake on the right at approximately T8. No abnormalities demonstrated on the coronal or sagittal images through the thoracic spine on the previous CT scan. Mild bilateral increased uptake at approximately T4 that is likely degenerative. 2. No abnormal uptake observed elsewhere.  CT chest, abdomen or pelvis 08/31/2014 IMPRESSION: 2 cm left breast mass, consistent with known  primary breast carcinoma. No evidence of metastatic disease within the chest, abdomen, or pelvis.  Small hiatal hernia incidentally noted.    ASSESSMENT & PLAN:  59 year old African-American female, postmenopausal, healthy and fit, who was found to have a left breast mass and a biopsy confirmed node metastasis by screening mammogram.  1. Left breast invasive ductal carcinoma, cT3N1M0, stage IIIA, ER+/PR+/HER2- -I reviewed her mammograms, breast ultrasound and MRI findings and core needle biopsy results. -I reviewed her bone scan and CT scan results with her today. She has a very small uptake at T8 on bone scan, but no collar related changes on the CT scan. She is not symptomatic, I think the likelihood of bone metastasis is very low. CT scan was negative for any distant metastasis. - I recommend neoadjuvant chemotherapy. Giving the large primary tumor, node positive disease, high Ki-67, she has very high risk of cancer recurrence after surgery. -given her ER+/HER2- disease, I recommend dose dense Adriamycin and Cytoxan every 2 weeks for fourth cycle, followed by weekly Taxol for 12 weeks. -Giving her positive lymph nodes, she would likely need adjuvant radiation, we'll refer her.  -Echocardiogram showed normal EF. -Her ANC 0.5 today, we'll hold chemotherapy today due to neutropenia, and rescheduled to next week -She had on Pro Neulasta injection last time, we'll change to Neulasta injection in the infusion room after next cycle chemo.  2. Nausea and vomiting  -She had moderate and prolonged nausea after last cycle chemotherapy, I given her Emend and Phenergan prescriptions today, she will also take dexamethasone daily for up to 5 days along with Emend after next cycle chemotherapy. -We discussed the importance of hydration after chemotherapy, especially when she has nausea and appetite.  3. Cancer genetics She has very strong family history of breast, colon and pancreatic cancer, all in  first-degree, I recommend her to see genetic counselor to ruled out inheritable breast cancer syndrome.  -her genetic testing result is pending  4. History of  non-STEMI in 03/2014 -She had cardiac catheterization, which showed mild diffuse coronary artery disease, but no need intervention.  -She will follow-up with her cardiologist Dr. Burt Knack -We discussed some of the chemotherapy drug may cause heart failure, we'll repeat her echo after chemo, she may need follow-up with Dr. Burt Knack during her chemotherapy.   5. 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   Plan -hold chemo today, and postpone her cycle 3 chemotherapy to one week later -I'll see her back in 3 weeks with cycle 4 AC -I called on Emend, Phenergan and acyclovir for her today   All questions were answered. The patient knows to call the clinic with any problems, questions or concerns. I spent 25 minutes counseling the patient face to face. The total time spent in the appointment was 30 minutes and more than 50% was on counseling.     Truitt Merle, MD 10/01/2014 9:50 AM

## 2014-10-01 NOTE — Telephone Encounter (Signed)
Pt confirmed labs/ov per 09/29 POF, gave pt AVS and Calendar.Cherylann Banas, sent msg to add chemo

## 2014-10-02 ENCOUNTER — Telehealth: Payer: Self-pay | Admitting: *Deleted

## 2014-10-02 ENCOUNTER — Ambulatory Visit: Payer: Federal, State, Local not specified - PPO

## 2014-10-02 NOTE — Telephone Encounter (Signed)
Per staff message and POF I have scheduled appts. Advised scheduler of appts. JMW  

## 2014-10-06 ENCOUNTER — Telehealth: Payer: Self-pay | Admitting: Hematology

## 2014-10-06 ENCOUNTER — Other Ambulatory Visit: Payer: Self-pay

## 2014-10-06 MED ORDER — ISOSORBIDE MONONITRATE ER 30 MG PO TB24: 30.0000 mg | ORAL_TABLET | Freq: Every day | ORAL | Status: DC

## 2014-10-06 NOTE — Telephone Encounter (Signed)
Lft msg for pt confirming labs/ov/treatment, advised pt to p/u scheduled at next visit.Marland Kitchen KJ

## 2014-10-08 ENCOUNTER — Other Ambulatory Visit (HOSPITAL_BASED_OUTPATIENT_CLINIC_OR_DEPARTMENT_OTHER): Payer: Federal, State, Local not specified - PPO

## 2014-10-08 ENCOUNTER — Ambulatory Visit (HOSPITAL_BASED_OUTPATIENT_CLINIC_OR_DEPARTMENT_OTHER): Payer: Federal, State, Local not specified - PPO

## 2014-10-08 VITALS — BP 143/93 | HR 92 | Temp 98.7°F | Resp 20

## 2014-10-08 DIAGNOSIS — C50212 Malignant neoplasm of upper-inner quadrant of left female breast: Secondary | ICD-10-CM

## 2014-10-08 DIAGNOSIS — C773 Secondary and unspecified malignant neoplasm of axilla and upper limb lymph nodes: Secondary | ICD-10-CM

## 2014-10-08 DIAGNOSIS — Z5111 Encounter for antineoplastic chemotherapy: Secondary | ICD-10-CM | POA: Diagnosis not present

## 2014-10-08 LAB — COMPREHENSIVE METABOLIC PANEL (CC13)
ALBUMIN: 3.6 g/dL (ref 3.5–5.0)
ALK PHOS: 89 U/L (ref 40–150)
ALT: 9 U/L (ref 0–55)
AST: 11 U/L (ref 5–34)
Anion Gap: 9 mEq/L (ref 3–11)
BUN: 11.6 mg/dL (ref 7.0–26.0)
CHLORIDE: 108 meq/L (ref 98–109)
CO2: 25 meq/L (ref 22–29)
Calcium: 10.4 mg/dL (ref 8.4–10.4)
Creatinine: 1 mg/dL (ref 0.6–1.1)
EGFR: 73 mL/min/{1.73_m2} — AB (ref >90)
GLUCOSE: 130 mg/dL (ref 70–140)
POTASSIUM: 4 meq/L (ref 3.5–5.1)
SODIUM: 141 meq/L (ref 136–145)
Total Bilirubin: <0.3 mg/dL (ref 0.20–1.20)
Total Protein: 6.7 g/dL (ref 6.4–8.3)

## 2014-10-08 LAB — CBC WITH DIFFERENTIAL/PLATELET
BASO%: 0.3 % (ref 0.0–2.0)
Basophils Absolute: 0 10e3/uL (ref 0.0–0.1)
EOS%: 0.7 % (ref 0.0–7.0)
Eosinophils Absolute: 0 10e3/uL (ref 0.0–0.5)
HCT: 35.6 % (ref 34.8–46.6)
HGB: 11.7 g/dL (ref 11.6–15.9)
LYMPH%: 14.9 % (ref 14.0–49.7)
MCH: 29.5 pg (ref 25.1–34.0)
MCHC: 32.9 g/dL (ref 31.5–36.0)
MCV: 89.7 fL (ref 79.5–101.0)
MONO#: 0.7 10e3/uL (ref 0.1–0.9)
MONO%: 12.2 % (ref 0.0–14.0)
NEUT#: 4.2 10e3/uL (ref 1.5–6.5)
NEUT%: 71.9 % (ref 38.4–76.8)
Platelets: 345 10e3/uL (ref 145–400)
RBC: 3.97 10e6/uL (ref 3.70–5.45)
RDW: 14.8 % — ABNORMAL HIGH (ref 11.2–14.5)
WBC: 5.8 10e3/uL (ref 3.9–10.3)
lymph#: 0.9 10e3/uL (ref 0.9–3.3)

## 2014-10-08 MED ORDER — SODIUM CHLORIDE 0.9 % IV SOLN
600.0000 mg/m2 | Freq: Once | INTRAVENOUS | Status: AC
  Administered 2014-10-08: 1240 mg via INTRAVENOUS
  Filled 2014-10-08: qty 62

## 2014-10-08 MED ORDER — PALONOSETRON HCL INJECTION 0.25 MG/5ML
0.2500 mg | Freq: Once | INTRAVENOUS | Status: AC
  Administered 2014-10-08: 0.25 mg via INTRAVENOUS

## 2014-10-08 MED ORDER — DOXORUBICIN HCL CHEMO IV INJECTION 2 MG/ML
60.0000 mg/m2 | Freq: Once | INTRAVENOUS | Status: AC
  Administered 2014-10-08: 124 mg via INTRAVENOUS
  Filled 2014-10-08: qty 62

## 2014-10-08 MED ORDER — PALONOSETRON HCL INJECTION 0.25 MG/5ML
INTRAVENOUS | Status: AC
  Filled 2014-10-08: qty 5

## 2014-10-08 MED ORDER — SODIUM CHLORIDE 0.9 % IV SOLN
Freq: Once | INTRAVENOUS | Status: AC
  Administered 2014-10-08: 12:00:00 via INTRAVENOUS
  Filled 2014-10-08: qty 5

## 2014-10-08 MED ORDER — SODIUM CHLORIDE 0.9 % IV SOLN
Freq: Once | INTRAVENOUS | Status: AC
  Administered 2014-10-08: 11:00:00 via INTRAVENOUS

## 2014-10-08 MED ORDER — SODIUM CHLORIDE 0.9 % IJ SOLN
10.0000 mL | INTRAMUSCULAR | Status: DC | PRN
  Administered 2014-10-08: 10 mL
  Filled 2014-10-08: qty 10

## 2014-10-08 MED ORDER — HEPARIN SOD (PORK) LOCK FLUSH 100 UNIT/ML IV SOLN
500.0000 [IU] | Freq: Once | INTRAVENOUS | Status: AC | PRN
  Administered 2014-10-08: 500 [IU]
  Filled 2014-10-08: qty 5

## 2014-10-08 NOTE — Patient Instructions (Signed)
[facility] Discharge Instructions for Patients Receiving Chemotherapy  Today you received the following chemotherapy agents adriamycin, cytoxan  To help prevent nausea and vomiting after your treatment, we encourage you to take your nausea medication    If you develop nausea and vomiting that is not controlled by your nausea medication, call the clinic.   BELOW ARE SYMPTOMS THAT SHOULD BE REPORTED IMMEDIATELY:  *FEVER GREATER THAN 100.5 F  *CHILLS WITH OR WITHOUT FEVER  NAUSEA AND VOMITING THAT IS NOT CONTROLLED WITH YOUR NAUSEA MEDICATION  *UNUSUAL SHORTNESS OF BREATH  *UNUSUAL BRUISING OR BLEEDING  TENDERNESS IN MOUTH AND THROAT WITH OR WITHOUT PRESENCE OF ULCERS  *URINARY PROBLEMS  *BOWEL PROBLEMS  UNUSUAL RASH Items with * indicate a potential emergency and should be followed up as soon as possible.  Feel free to call the clinic you have any questions or concerns. The clinic phone number is [phone].  Please show the [facility] at check-in to the Emergency Department and triage nurse.

## 2014-10-09 ENCOUNTER — Ambulatory Visit (HOSPITAL_BASED_OUTPATIENT_CLINIC_OR_DEPARTMENT_OTHER): Payer: Federal, State, Local not specified - PPO

## 2014-10-09 VITALS — BP 141/90 | HR 88 | Temp 98.3°F

## 2014-10-09 DIAGNOSIS — C50212 Malignant neoplasm of upper-inner quadrant of left female breast: Secondary | ICD-10-CM | POA: Diagnosis not present

## 2014-10-09 MED ORDER — PEGFILGRASTIM INJECTION 6 MG/0.6ML ~~LOC~~
6.0000 mg | PREFILLED_SYRINGE | Freq: Once | SUBCUTANEOUS | Status: AC
  Administered 2014-10-09: 6 mg via SUBCUTANEOUS
  Filled 2014-10-09: qty 0.6

## 2014-10-09 NOTE — Patient Instructions (Signed)
Pegfilgrastim injection What is this medicine? PEGFILGRASTIM (PEG fil gra stim) is a long-acting granulocyte colony-stimulating factor that stimulates the growth of neutrophils, a type of white blood cell important in the body's fight against infection. It is used to reduce the incidence of fever and infection in patients with certain types of cancer who are receiving chemotherapy that affects the bone marrow, and to increase survival after being exposed to high doses of radiation. This medicine may be used for other purposes; ask your health care provider or pharmacist if you have questions. What should I tell my health care provider before I take this medicine? They need to know if you have any of these conditions: -kidney disease -latex allergy -ongoing radiation therapy -sickle cell disease -skin reactions to acrylic adhesives (On-Body Injector only) -an unusual or allergic reaction to pegfilgrastim, filgrastim, other medicines, foods, dyes, or preservatives -pregnant or trying to get pregnant -breast-feeding How should I use this medicine? This medicine is for injection under the skin. If you get this medicine at home, you will be taught how to prepare and give the pre-filled syringe or how to use the On-body Injector. Refer to the patient Instructions for Use for detailed instructions. Use exactly as directed. Take your medicine at regular intervals. Do not take your medicine more often than directed. It is important that you put your used needles and syringes in a special sharps container. Do not put them in a trash can. If you do not have a sharps container, call your pharmacist or healthcare provider to get one. Talk to your pediatrician regarding the use of this medicine in children. While this drug may be prescribed for selected conditions, precautions do apply. Overdosage: If you think you have taken too much of this medicine contact a poison control center or emergency room at  once. NOTE: This medicine is only for you. Do not share this medicine with others. What if I miss a dose? It is important not to miss your dose. Call your doctor or health care professional if you miss your dose. If you miss a dose due to an On-body Injector failure or leakage, a new dose should be administered as soon as possible using a single prefilled syringe for manual use. What may interact with this medicine? Interactions have not been studied. Give your health care provider a list of all the medicines, herbs, non-prescription drugs, or dietary supplements you use. Also tell them if you smoke, drink alcohol, or use illegal drugs. Some items may interact with your medicine. This list may not describe all possible interactions. Give your health care provider a list of all the medicines, herbs, non-prescription drugs, or dietary supplements you use. Also tell them if you smoke, drink alcohol, or use illegal drugs. Some items may interact with your medicine. What should I watch for while using this medicine? You may need blood work done while you are taking this medicine. If you are going to need a MRI, CT scan, or other procedure, tell your doctor that you are using this medicine (On-Body Injector only). What side effects may I notice from receiving this medicine? Side effects that you should report to your doctor or health care professional as soon as possible: -allergic reactions like skin rash, itching or hives, swelling of the face, lips, or tongue -dizziness -fever -pain, redness, or irritation at site where injected -pinpoint red spots on the skin -red or dark-brown urine -shortness of breath or breathing problems -stomach or side pain, or pain   at the shoulder -swelling -tiredness -trouble passing urine or change in the amount of urine Side effects that usually do not require medical attention (report to your doctor or health care professional if they continue or are  bothersome): -bone pain -muscle pain This list may not describe all possible side effects. Call your doctor for medical advice about side effects. You may report side effects to FDA at 1-800-FDA-1088. Where should I keep my medicine? Keep out of the reach of children. Store pre-filled syringes in a refrigerator between 2 and 8 degrees C (36 and 46 degrees F). Do not freeze. Keep in carton to protect from light. Throw away this medicine if it is left out of the refrigerator for more than 48 hours. Throw away any unused medicine after the expiration date. NOTE: This sheet is a summary. It may not cover all possible information. If you have questions about this medicine, talk to your doctor, pharmacist, or health care provider.    2016, Elsevier/Gold Standard. (2014-01-08 14:30:14)  

## 2014-10-13 ENCOUNTER — Telehealth: Payer: Self-pay | Admitting: Genetic Counselor

## 2014-10-13 ENCOUNTER — Encounter: Payer: Self-pay | Admitting: Genetic Counselor

## 2014-10-13 DIAGNOSIS — Z1379 Encounter for other screening for genetic and chromosomal anomalies: Secondary | ICD-10-CM | POA: Insufficient documentation

## 2014-10-13 NOTE — Telephone Encounter (Signed)
LM on VM with good news on test results. 

## 2014-10-14 ENCOUNTER — Other Ambulatory Visit: Payer: Self-pay | Admitting: Emergency Medicine

## 2014-10-14 ENCOUNTER — Telehealth: Payer: Self-pay | Admitting: Genetic Counselor

## 2014-10-14 ENCOUNTER — Ambulatory Visit: Payer: Self-pay | Admitting: Genetic Counselor

## 2014-10-14 ENCOUNTER — Emergency Department (HOSPITAL_COMMUNITY)
Admission: EM | Admit: 2014-10-14 | Discharge: 2014-10-14 | Disposition: A | Discharge status: Home / Self Care | Payer: Federal, State, Local not specified - PPO | Attending: Emergency Medicine | Admitting: Emergency Medicine

## 2014-10-14 DIAGNOSIS — R197 Diarrhea, unspecified: Secondary | ICD-10-CM | POA: Diagnosis not present

## 2014-10-14 DIAGNOSIS — R630 Anorexia: Secondary | ICD-10-CM | POA: Diagnosis not present

## 2014-10-14 DIAGNOSIS — R1030 Lower abdominal pain, unspecified: Secondary | ICD-10-CM | POA: Insufficient documentation

## 2014-10-14 DIAGNOSIS — Z7982 Long term (current) use of aspirin: Secondary | ICD-10-CM | POA: Diagnosis not present

## 2014-10-14 DIAGNOSIS — Z9889 Other specified postprocedural states: Secondary | ICD-10-CM | POA: Insufficient documentation

## 2014-10-14 DIAGNOSIS — Z800 Family history of malignant neoplasm of digestive organs: Secondary | ICD-10-CM

## 2014-10-14 DIAGNOSIS — Z8701 Personal history of pneumonia (recurrent): Secondary | ICD-10-CM | POA: Diagnosis not present

## 2014-10-14 DIAGNOSIS — M1711 Unilateral primary osteoarthritis, right knee: Secondary | ICD-10-CM | POA: Insufficient documentation

## 2014-10-14 DIAGNOSIS — I252 Old myocardial infarction: Secondary | ICD-10-CM | POA: Insufficient documentation

## 2014-10-14 DIAGNOSIS — Z79899 Other long term (current) drug therapy: Secondary | ICD-10-CM | POA: Diagnosis not present

## 2014-10-14 DIAGNOSIS — Z853 Personal history of malignant neoplasm of breast: Secondary | ICD-10-CM | POA: Diagnosis not present

## 2014-10-14 DIAGNOSIS — R112 Nausea with vomiting, unspecified: Secondary | ICD-10-CM | POA: Diagnosis not present

## 2014-10-14 DIAGNOSIS — Z803 Family history of malignant neoplasm of breast: Secondary | ICD-10-CM

## 2014-10-14 DIAGNOSIS — Z1379 Encounter for other screening for genetic and chromosomal anomalies: Secondary | ICD-10-CM

## 2014-10-14 DIAGNOSIS — I10 Essential (primary) hypertension: Secondary | ICD-10-CM | POA: Diagnosis not present

## 2014-10-14 DIAGNOSIS — C50212 Malignant neoplasm of upper-inner quadrant of left female breast: Secondary | ICD-10-CM

## 2014-10-14 LAB — LIPASE, BLOOD: Lipase: 13 U/L — ABNORMAL LOW (ref 22–51)

## 2014-10-14 LAB — CBC WITH DIFFERENTIAL/PLATELET
BASOS ABS: 0 10*3/uL (ref 0.0–0.1)
Basophils Relative: 1 %
EOS ABS: 0 10*3/uL (ref 0.0–0.7)
Eosinophils Relative: 0 %
HEMATOCRIT: 36.4 % (ref 36.0–46.0)
HEMOGLOBIN: 12.3 g/dL (ref 12.0–15.0)
LYMPHS PCT: 26 %
Lymphs Abs: 0.8 10*3/uL (ref 0.7–4.0)
MCH: 29.9 pg (ref 26.0–34.0)
MCHC: 33.8 g/dL (ref 30.0–36.0)
MCV: 88.3 fL (ref 78.0–100.0)
MONOS PCT: 2 %
Monocytes Absolute: 0.1 10*3/uL (ref 0.1–1.0)
NEUTROS PCT: 71 %
Neutro Abs: 2 10*3/uL (ref 1.7–7.7)
Platelets: 226 10*3/uL (ref 150–400)
RBC: 4.12 MIL/uL (ref 3.87–5.11)
RDW: 14.7 % (ref 11.5–15.5)
WBC: 2.9 10*3/uL — ABNORMAL LOW (ref 4.0–10.5)

## 2014-10-14 LAB — COMPREHENSIVE METABOLIC PANEL
ALBUMIN: 4.1 g/dL (ref 3.5–5.0)
ALK PHOS: 109 U/L (ref 38–126)
ALT: 13 U/L — AB (ref 14–54)
ANION GAP: 10 (ref 5–15)
AST: 18 U/L (ref 15–41)
BILIRUBIN TOTAL: 0.9 mg/dL (ref 0.3–1.2)
BUN: 19 mg/dL (ref 6–20)
CALCIUM: 10.7 mg/dL — AB (ref 8.9–10.3)
CO2: 24 mmol/L (ref 22–32)
Chloride: 104 mmol/L (ref 101–111)
Creatinine, Ser: 1.08 mg/dL — ABNORMAL HIGH (ref 0.44–1.00)
GFR calc Af Amer: >60 mL/min (ref >60)
GFR calc non Af Amer: 55 mL/min — ABNORMAL LOW (ref >60)
GLUCOSE: 131 mg/dL — AB (ref 65–99)
Potassium: 3.8 mmol/L (ref 3.5–5.1)
SODIUM: 138 mmol/L (ref 135–145)
Total Protein: 7.3 g/dL (ref 6.5–8.1)

## 2014-10-14 LAB — I-STAT CG4 LACTIC ACID, ED: LACTIC ACID, VENOUS: 2.57 mmol/L — AB (ref 0.5–2.0)

## 2014-10-14 LAB — MAGNESIUM: Magnesium: 1.9 mg/dL (ref 1.7–2.4)

## 2014-10-14 MED ORDER — ALUM & MAG HYDROXIDE-SIMETH 200-200-20 MG/5ML PO SUSP
30.0000 mL | Freq: Once | ORAL | Status: AC
  Administered 2014-10-14: 30 mL via ORAL
  Filled 2014-10-14: qty 30

## 2014-10-14 MED ORDER — FAMOTIDINE 20 MG PO TABS
40.0000 mg | ORAL_TABLET | Freq: Once | ORAL | Status: AC
  Administered 2014-10-14: 40 mg via ORAL
  Filled 2014-10-14: qty 2

## 2014-10-14 NOTE — ED Notes (Signed)
IV fluids stopped. 20g IV to left ac dc'd.

## 2014-10-14 NOTE — Progress Notes (Signed)
HPI: Mary Randall was previously seen in the [facility] due to a personal and family history of cancer and concerns regarding a hereditary predisposition to cancer. Please refer to our prior cancer genetics clinic note for more information regarding Mary Randall medical, social and family histories, and our assessment and recommendations, at the time. Mary Randall recent genetic test results were disclosed to her, as were recommendations warranted by these results. These results and recommendations are discussed in more detail below.  FAMILY HISTORY:  We obtained a detailed, 4-generation family history.  Significant diagnoses are listed below: Family History  Problem Relation Age of Onset  . Pancreatic cancer Brother 63  . Cancer Brother 40    breast cancer   . Cancer Mother 1    colon cancer   . Hypertension Mother   . Diabetes type II Mother   . CVA Father   . Healthy Sister   . Healthy Sister     The patient has two children, and four brothers and two sisters. One brother was diagnosed with breast cancer at age 40, another was diagnosed with pancreatic cancer at 31. The remaining siblings are healthy. Her mother was diagnosed with colon cancer at 59 and her father died of a stroke at 20. Her mother had five sisters and three brothers. One brother died with prostate cancer and one sister had a daughter with breast cancer in her 2s-30s. Mary Randall maternal grandmother was diagnosed with leukemia in her 41s. Patient's maternal ancestors are of [place] American descent, and paternal ancestors are of [place] and [place] descent. There is no reported Ashkenazi Jewish ancestry. There is no known consanguinity.  GENETIC TEST RESULTS: At the time of Mary Randall visit, we recommended she pursue genetic testing of the Breast/Ovarian cancer gene panel. The Breast/Ovarian gene panel offered by [facility] includes sequencing and rearrangement analysis for the following 20 genes:   ATM, BARD1, BRCA1, BRCA2, BRIP1, CDH1, CHEK2, EPCAM, FANCC, MLH1, MSH2, MSH6, NBN, PALB2, PMS2, PTEN, RAD51C, RAD51D, TP53, and XRCC2.   The report date is October 09, 2014.  Genetic testing was normal, and did not reveal a deleterious mutation in these genes. The test report has been scanned into EPIC and is located under the Molecular Pathology section of the Results Review tab.   We discussed with Mary Randall that since the current genetic testing is not perfect, it is possible there may be a gene mutation in one of these genes that current testing cannot detect, but that chance is small. We also discussed, that it is possible that another gene that has not yet been discovered, or that we have not yet tested, is responsible for the cancer diagnoses in the family, and it is, therefore, important to remain in touch with cancer genetics in the future so that we can continue to offer Mary Randall the most up to date genetic testing.   CANCER SCREENING RECOMMENDATIONS: Given Mary Randall personal and family histories, we must interpret these negative results with some caution.  Families with features suggestive of hereditary risk for cancer tend to have multiple family members with cancer, diagnoses in multiple generations and diagnoses before the age of 16. Mary Randall family exhibits some of these features. Thus this result may simply reflect our current inability to detect all mutations within these genes or there may be a different gene that has not yet been discovered or tested.   RECOMMENDATIONS FOR FAMILY MEMBERS: Women in this family might be at  some increased risk of developing cancer, over the general population risk, simply due to the family history of cancer. We recommended women in this family have a yearly mammogram beginning at age 108, or 58 years younger than the earliest onset of cancer, an an annual clinical breast exam, and perform monthly breast self-exams. Women in this family should also have a  gynecological exam as recommended by their primary provider. All family members should have a colonoscopy by age 45.  Based on Mary Randall family history, we recommended her brother, who was diagnosed with breast cancer at age 73, have genetic counseling and testing. Mary Randall will let us know if we can be of any assistance in coordinating genetic counseling and/or testing for this family member.   FOLLOW-UP: Lastly, we discussed with Mary Randall that cancer genetics is a rapidly advancing field and it is possible that new genetic tests will be appropriate for her and/or her family members in the future. We encouraged her to remain in contact with cancer genetics on an annual basis so we can update her personal and family histories and let her know of advances in cancer genetics that may benefit this family.   Our contact number was provided. Mary Randall questions were answered to her satisfaction, and she knows she is welcome to call us at anytime with additional questions or concerns.   Roma Kayser, MS, [facility] Certified Genetic Counselor [email or web address]

## 2014-10-14 NOTE — ED Notes (Signed)
Patient is resting comfortably. Family member remains at bedside.

## 2014-10-14 NOTE — Telephone Encounter (Signed)
Revealed negative genetic testing.  Discussed that her brother, who had breast cancer at age 58, should consider genetic testing as well.

## 2014-10-14 NOTE — Discharge Instructions (Signed)
Nausea and Vomiting Mary Randall, continue to take your medications for nausea and see your oncologist within 3 days for close follow up.  If any symptoms worsen, come back to the ED immediately. Thank you. Nausea means you feel sick to your stomach. Throwing up (vomiting) is a reflex where stomach contents come out of your mouth. HOME CARE   Take medicine as told by your doctor.  Do not force yourself to eat. However, you do need to drink fluids.  If you feel like eating, eat a normal diet as told by your doctor.  Eat rice, wheat, potatoes, bread, lean meats, yogurt, fruits, and vegetables.  Avoid high-fat foods.  Drink enough fluids to keep your pee (urine) clear or pale yellow.  Ask your doctor how to replace body fluid losses (rehydrate). Signs of body fluid loss (dehydration) include:  Feeling very thirsty.  Dry lips and mouth.  Feeling dizzy.  Dark pee.  Peeing less than normal.  Feeling confused.  Fast breathing or heart rate. GET HELP RIGHT AWAY IF:   You have blood in your throw up.  You have black or bloody poop (stool).  You have a bad headache or stiff neck.  You feel confused.  You have bad belly (abdominal) pain.  You have chest pain or trouble breathing.  You do not pee at least once every 8 hours.  You have cold, clammy skin.  You keep throwing up after 24 to 48 hours.  You have a fever. MAKE SURE YOU:   Understand these instructions.  Will watch your condition.  Will get help right away if you are not doing well or get worse.   This information is not intended to replace advice given to you by your health care provider. Make sure you discuss any questions you have with your health care provider.   Document Released: 06/07/2007 Document Revised: 03/13/2011 Document Reviewed: 05/20/2010 Elsevier Interactive Patient Education [employer].

## 2014-10-14 NOTE — ED Provider Notes (Signed)
CSN: [id number]     Arrival date & time 10/14/14  0107 History  By signing my name below, I, Helane Gunther, attest that this documentation has been prepared under the direction and in the presence of Everlene Balls, MD. Electronically Signed: Helane Gunther, ED Scribe. 10/14/2014. 2:51 AM.    No chief complaint on file.  The history is provided by the patient and a relative. No language interpreter was used.   HPI Comments: Mary Randall is a 59 y.o. female who presents to the Emergency Department complaining of nausea and vomiting onset today after receiving chemotherapy. She reports associated diarrhea (2-3x), right-sided abdominal pain, and loss of appetite. Pt states she is on chemotherapy for breast cancer. Per daughter today's treatment is the pt's third treatment. She states the pt has several anti-nausea medications at home of which only the Ativan has been able to relieve the nausea somewhat. She notes the pt has not taken her blood pressure medication today. Pt denies dysuria and hematuria.  Past Medical History  Diagnosis Date  . Hypertension   . Angina effort   . NSTEMI (non-ST elevated myocardial infarction) 03/16/2014    non-obs CAD, spasm seen  . Pneumonia 2009  . Arthritis     "right knee" (03/16/2014)  . 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 Breast cancer 2016  . Family history of breast cancer   . Family history of pancreatic cancer   . Family history of breast cancer in female    Past Surgical History  Procedure Laterality Date  . Other surgical history  1991    Tubal pregnancy  . Cardiac catheterization  2010; 03/16/2014  . Tubal ligation  1981  . Left heart catheterization with coronary angiogram N/A 03/16/2014    Procedure: LEFT HEART CATHETERIZATION WITH CORONARY ANGIOGRAM;  Surgeon: Sherren Mocha, MD; Non-obs CAD, EF 55%  . Bunionectomy Bilateral   . Breast biopsy Left 08/06/14  . Portacath placement N/A 08/28/2014    Procedure: INSERTION  PORT-A-CATH;  Surgeon: Autumn Messing III, MD;  [facility];  Service: General;  Laterality: N/A;   Family History  Problem Relation Age of Onset  . Pancreatic cancer Brother 88  . Cancer Brother 40    breast cancer   . Cancer Mother 46    colon cancer   . Hypertension Mother   . Diabetes type II Mother   . CVA Father   . Healthy Sister   . Healthy Sister    Social History  Substance Use Topics  . Smoking status: Never Smoker   . Smokeless tobacco: Never Used  . Alcohol Use: No   OB History    No data available     Review of Systems A complete 10 system review of systems was obtained and all systems are negative except as noted in the HPI and PMH.   Allergies  Codeine; Carvedilol; and Percocet  Home Medications   Prior to Admission medications   Medication Sig Start Date End Date Taking? Authorizing Provider  acyclovir (ZOVIRAX) 400 MG tablet Take 1 tablet (400 mg total) by mouth 3 (three) times daily. 10/01/14   Truitt Merle, MD  amLODipine (NORVASC) 10 MG tablet Take 1 tablet (10 mg total) by mouth daily. 09/16/14   Sherren Mocha, MD  aprepitant (EMEND) 80 MG capsule Take 1 capsule (80 mg total) by mouth daily. Start the day after chemotherapy. 10/01/14   Truitt Merle, MD  aspirin EC 81 MG tablet Take 81 mg  by mouth daily.    Historical Provider, MD  atorvastatin (LIPITOR) 40 MG tablet Take 1 tablet (40 mg total) by mouth daily at 6 PM. 03/17/14   Rhonda G Barrett, PA-C  dexamethasone (DECADRON) 4 MG tablet Take 2 pills once the day after chemotherapy and then 2 times a day for 2 days. Take with food 09/02/14   Truitt Merle, MD  hydrochlorothiazide (HYDRODIURIL) 25 MG tablet Take 25 mg by mouth daily.    Historical Provider, MD  irbesartan (AVAPRO) 300 MG tablet Take 1 tablet (300 mg total) by mouth daily. 09/16/14   Sherren Mocha, MD  isosorbide mononitrate (IMDUR) 30 MG 24 hr tablet Take 1 tablet (30 mg total) by mouth daily. 10/06/14   Sherren Mocha, MD  Lidocaine-Prilocaine, Bulk,  2.5-2.5 % CREA Apply 5 mLs topically as needed. 1.5 hours before use with chemotherapy.Cover with plastic 09/02/14   Truitt Merle, MD  LORazepam (ATIVAN) 0.5 MG tablet Take 1 tablet by mouth every 6 hours as needed for nausea or vomiting. 10/01/14   Truitt Merle, MD  nitroGLYCERIN (NITROSTAT) 0.4 MG SL tablet Place 1 tablet (0.4 mg total) under the tongue every 5 (five) minutes as needed for chest pain. Patient not taking: Reported on 08/31/2014 03/17/14   Evelene Croon Barrett, PA-C  ondansetron (ZOFRAN) 8 MG tablet Take 1 tablet (8 mg total) by mouth 2 (two) times daily as needed for nausea or vomiting. Start on 3rd day after Chemotherapy 09/02/14   Truitt Merle, MD  potassium chloride (K-DUR) 10 MEQ tablet Take 1 tablet (10 mEq total) by mouth daily. 03/17/14   Rhonda G Barrett, PA-C  prochlorperazine (COMPAZINE) 10 MG tablet Take 1 tablet (10 mg total) by mouth every 6 (six) hours as needed for nausea or vomiting. Patient not taking: Reported on 09/11/2014 09/02/14   Truitt Merle, MD  promethazine (PHENERGAN) 12.5 MG tablet Take 1-2 tablets (12.5-25 mg total) by mouth every 6 (six) hours as needed for nausea or vomiting. 10/01/14   Truitt Merle, MD   There were no vitals taken for this visit. Physical Exam  Constitutional: She is oriented to person, place, and time. She appears well-developed and well-nourished. No distress.  Appears tired  HENT:  Head: Normocephalic and atraumatic.  Nose: Nose normal.  Mouth/Throat: Oropharynx is clear and moist. No oropharyngeal exudate.  Eyes: Conjunctivae and EOM are normal. Pupils are equal, round, and reactive to light. No scleral icterus.  Neck: Normal range of motion. Neck supple. No JVD present. No tracheal deviation present. No thyromegaly present.  Cardiovascular: Normal rate, regular rhythm and normal heart sounds.  Exam reveals no gallop and no friction rub.   No murmur heard. Pulmonary/Chest: Effort normal and breath sounds normal. No respiratory distress. She has no wheezes.  She exhibits no tenderness.  Abdominal: Soft. Bowel sounds are normal. She exhibits no distension and no mass. There is tenderness. There is no rebound and no guarding.  Suprapubic TTP  Musculoskeletal: Normal range of motion. She exhibits no edema or tenderness.  Lymphadenopathy:    She has no cervical adenopathy.  Neurological: She is alert and oriented to person, place, and time. No cranial nerve deficit. She exhibits normal muscle tone.  Skin: Skin is warm and dry. No rash noted. No erythema. No pallor.  Nursing note and vitals reviewed.   ED Course  Procedures  DIAGNOSTIC STUDIES: Oxygen Saturation is 100% on RA, normal by my interpretation.    COORDINATION OF CARE: 2:16 AM - Discussed plans to continue IV  fluids and wait on diagnostic studies. Discussed possible admission to the hospital. Pt advised of plan for treatment and pt agrees.  Labs Review Labs Reviewed  MAGNESIUM    Imaging Review No results found. I have personally reviewed and evaluated these images and lab results as part of my medical decision-making.   EKG Interpretation None      MDM   Final diagnoses:  None    Patient presents to the ED for intractable N/V after recent chemo.  Labs do not show dehydration.  She was given IVF and IV zofran which relieved her zofran.  She was able to tolerate PO in the ED.  She appears well and in NAD.  She has zofran, phenergan, ativan, compazine at home for nausea and does not need more medication.  Oncology follow up advised.  She appears well and in NAD, her VS remain within her normal limits and she is safe for DC.   I, Dannielle Baskins, personally performed the services described in this documentation. All medical record entries made by the scribe were at my direction and in my presence.  I have reviewed the chart and discharge instructions and agree that the record reflects my personal performance and is accurate and complete. Anton Cheramie.  10/14/2014. 4:16 AM.      Everlene Balls, MD 10/14/14 [id number]

## 2014-10-15 ENCOUNTER — Ambulatory Visit: Payer: Federal, State, Local not specified - PPO

## 2014-10-15 ENCOUNTER — Ambulatory Visit: Payer: Federal, State, Local not specified - PPO | Admitting: Hematology

## 2014-10-15 ENCOUNTER — Other Ambulatory Visit: Payer: Federal, State, Local not specified - PPO

## 2014-10-16 ENCOUNTER — Ambulatory Visit: Payer: Federal, State, Local not specified - PPO

## 2014-10-22 ENCOUNTER — Other Ambulatory Visit (HOSPITAL_BASED_OUTPATIENT_CLINIC_OR_DEPARTMENT_OTHER): Payer: Federal, State, Local not specified - PPO

## 2014-10-22 ENCOUNTER — Telehealth: Payer: Self-pay | Admitting: Hematology

## 2014-10-22 ENCOUNTER — Ambulatory Visit (HOSPITAL_BASED_OUTPATIENT_CLINIC_OR_DEPARTMENT_OTHER): Payer: Federal, State, Local not specified - PPO | Admitting: Hematology

## 2014-10-22 ENCOUNTER — Ambulatory Visit (HOSPITAL_COMMUNITY): Admission: RE | Admit: 2014-10-22 | Payer: Federal, State, Local not specified - PPO | Source: Ambulatory Visit

## 2014-10-22 ENCOUNTER — Telehealth: Payer: Self-pay | Admitting: *Deleted

## 2014-10-22 ENCOUNTER — Encounter: Payer: Self-pay | Admitting: Hematology

## 2014-10-22 ENCOUNTER — Ambulatory Visit: Payer: Federal, State, Local not specified - PPO

## 2014-10-22 VITALS — BP 129/102 | HR 100 | Temp 99.4°F | Resp 18 | Ht 67.0 in | Wt 185.1 lb

## 2014-10-22 DIAGNOSIS — C50212 Malignant neoplasm of upper-inner quadrant of left female breast: Secondary | ICD-10-CM | POA: Diagnosis not present

## 2014-10-22 DIAGNOSIS — R112 Nausea with vomiting, unspecified: Secondary | ICD-10-CM | POA: Diagnosis not present

## 2014-10-22 DIAGNOSIS — C773 Secondary and unspecified malignant neoplasm of axilla and upper limb lymph nodes: Secondary | ICD-10-CM

## 2014-10-22 LAB — COMPREHENSIVE METABOLIC PANEL (CC13)
ANION GAP: 9 meq/L (ref 3–11)
AST: 9 U/L (ref 5–34)
Albumin: 3.6 g/dL (ref 3.5–5.0)
Alkaline Phosphatase: 84 U/L (ref 40–150)
BUN: 10.2 mg/dL (ref 7.0–26.0)
CHLORIDE: 108 meq/L (ref 98–109)
CO2: 27 meq/L (ref 22–29)
CREATININE: 1 mg/dL (ref 0.6–1.1)
Calcium: 10.6 mg/dL — ABNORMAL HIGH (ref 8.4–10.4)
EGFR: 68 mL/min/{1.73_m2} — AB (ref >90)
GLUCOSE: 106 mg/dL (ref 70–140)
Potassium: 3.5 mEq/L (ref 3.5–5.1)
SODIUM: 144 meq/L (ref 136–145)
TOTAL PROTEIN: 6.5 g/dL (ref 6.4–8.3)

## 2014-10-22 LAB — CBC WITH DIFFERENTIAL/PLATELET
BASO%: 0.4 % (ref 0.0–2.0)
Basophils Absolute: 0 10*3/uL (ref 0.0–0.1)
EOS%: 0.4 % (ref 0.0–7.0)
Eosinophils Absolute: 0 10*3/uL (ref 0.0–0.5)
HCT: 34.5 % — ABNORMAL LOW (ref 34.8–46.6)
HGB: 11.3 g/dL — ABNORMAL LOW (ref 11.6–15.9)
LYMPH%: 12.6 % — AB (ref 14.0–49.7)
MCH: 29.2 pg (ref 25.1–34.0)
MCHC: 32.8 g/dL (ref 31.5–36.0)
MCV: 89 fL (ref 79.5–101.0)
MONO#: 0.7 10*3/uL (ref 0.1–0.9)
MONO%: 12.1 % (ref 0.0–14.0)
NEUT%: 74.5 % (ref 38.4–76.8)
NEUTROS ABS: 4.6 10*3/uL (ref 1.5–6.5)
PLATELETS: 195 10*3/uL (ref 145–400)
RBC: 3.88 10*6/uL (ref 3.70–5.45)
RDW: 15.6 % — ABNORMAL HIGH (ref 11.2–14.5)
WBC: 6.2 10*3/uL (ref 3.9–10.3)
lymph#: 0.8 10*3/uL — ABNORMAL LOW (ref 0.9–3.3)

## 2014-10-22 NOTE — Telephone Encounter (Signed)
Per staff message and POF I have scheduled appts. Advised scheduler of appts. JMW  

## 2014-10-22 NOTE — Progress Notes (Signed)
[facility]  Telephone:([phone]) [phone] Fax:([phone]) [phone]  Clinic follow up Note   Patient Care Team: Macy Mis, MD as PCP - General (Family Medicine) Chevis Pretty III, MD as Consulting Physician (General Surgery) Tonny Bollman, MD as Consulting Physician (Cardiology) 10/22/2014  CHIEF COMPLAINTS:  Follow up left breast cancer   Oncology History   Breast cancer of upper-inner quadrant of left female breast   Staging form: Breast, AJCC 7th Edition     Clinical: Stage IIIA (T3, N1, M0) - Unsigned       Breast cancer of upper-inner quadrant of left female breast (HCC)   08/06/2014 Initial Diagnosis Breast cancer of upper-inner quadrant of left female breast   08/06/2014 Initial Biopsy Left breast 9:30 o'clock position showed invasive ductal carcinoma, and 1 left axillary lymph node biopsy showed positive for metastatic carcinoma.   08/06/2014 Receptors her2 ER 100% positive, PR 5% positive, Ki-67 50%   08/06/2014 Mammogram Diagnostic mammogram and ultrasound showed left breast mass with associated calcification measuring 5.5 cm, indeterminate 8 left axillary lymph node was cortical thickening, circumscribed low density oval 37mm mass at 12:00 within the left breast.   09/03/2014 -  Adjuvant Chemotherapy ddAC every 2 weeks X4, followed by weekly Taxol X12     HISTORY OF PRESENTING ILLNESS:  Mary Randall 59 y.o. female is here because of recently newly diagnosed left breast cancer. She presents to the clinic with her daughter.  This was discovered by screening mammogram. Her prior mammogram was 2 years ago. She noticed mild left nipple retraction, no palpable mass, no other changes. She feels well overall, denies any significant pain except mild right knee pain from arthritis. She denies any change of her appetite, or recent weight loss. She works for post office, Games developer, and is physically active.   Her family history is notable for breast cancer in her brother,  pancreatic cancer in another brother, and colon cancer in her mother. She is postmenopausal, last menstrual period 8 years ago. She has a daughter and a son. She lives with her brother, and her daughter is likely going to stay with her.  INTERIM HISTORY Shady returns for follow up and chemo. She got quite sick after last cycle chemotherapy. She had a persistent nausea and vomiting, she tried Zofran and Compazine, which did not help, she used Ativan. She did not fill the Emend and Phenergan until recently. She also had diarrhea, she eventually went to emergency room late last week, and that was treated with IV fluids. She felt much better in the past to 3 days, diarrhea resolved, still has mild intermittent nausea, she actually vomited this morning after breakfast. No fever or chills, Tmax was 99.4 at home. She lost 6 pounds in the past 2 weeks.   MEDICAL HISTORY:  Past Medical History  Diagnosis Date  . Hypertension   . Angina effort (HCC)   . NSTEMI (non-ST elevated myocardial infarction) (HCC) 03/16/2014    non-obs CAD, spasm seen  . Pneumonia 2009  . Arthritis     "right knee" (03/16/2014)  . 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 Breast cancer (HCC) 2016  . Family history of breast cancer   . Family history of pancreatic cancer   . Family history of breast cancer in female     SURGICAL HISTORY: Past Surgical History  Procedure Laterality Date  . Other surgical history  1991    Tubal pregnancy  . Cardiac catheterization  2010; 03/16/2014  . Tubal ligation  1981  . Left heart catheterization with coronary angiogram N/A 03/16/2014    Procedure: LEFT HEART CATHETERIZATION WITH CORONARY ANGIOGRAM;  Surgeon: Sherren Mocha, MD; Non-obs CAD, EF 55%  . Bunionectomy Bilateral   . Breast biopsy Left 08/06/14  . Portacath placement N/A 08/28/2014    Procedure: INSERTION PORT-A-CATH;  Surgeon: Autumn Messing III, MD;  Location: [facility];  Service: General;  Laterality: N/A;    GYN HISTORY  Menarchal: 15 LMP: 51 Contraceptive: 3 HRT: no G2P2: no breast feeding     SOCIAL HISTORY: Social History   Social History  . Marital Status: Legally Separated    Spouse Name: N/A  . Number of Children: Daughter 41, son 11   . Years of Education: N/A   Occupational History  . Works for post office    Social History Main Topics  . Smoking status: Never Smoker   . Smokeless tobacco: Never Used  . Alcohol Use: No  . Drug Use: No  . Sexual Activity: No   Other Topics Concern  . Not on file   Social History Narrative    FAMILY HISTORY: Family History  Problem Relation Age of Onset  . Pancreatic cancer Brother 88  . Cancer Brother 40    breast cancer   . Cancer Mother 66    colon cancer   . Hypertension Mother   . Diabetes type II Mother   . CVA Father   . Healthy Sister   . Healthy Sister     ALLERGIES:  is allergic to codeine; carvedilol; and percocet.  MEDICATIONS:  Current Outpatient Prescriptions  Medication Sig Dispense Refill  . acyclovir (ZOVIRAX) 400 MG tablet Take 1 tablet (400 mg total) by mouth 3 (three) times daily. 30 tablet 1  . amLODipine (NORVASC) 10 MG tablet Take 1 tablet (10 mg total) by mouth daily. 30 tablet 3  . aprepitant (EMEND) 80 MG capsule Take 1 capsule (80 mg total) by mouth daily. Start the day after chemotherapy. 3 capsule 1  . atorvastatin (LIPITOR) 40 MG tablet Take 1 tablet (40 mg total) by mouth daily at 6 PM. 30 tablet 11  . dexamethasone (DECADRON) 4 MG tablet Take 2 pills once the day after chemotherapy and then 2 times a day for 2 days. Take with food 30 tablet 1  . hydrochlorothiazide (HYDRODIURIL) 25 MG tablet Take 25 mg by mouth daily.    . irbesartan (AVAPRO) 300 MG tablet Take 1 tablet (300 mg total) by mouth daily. 30 tablet 2  . isosorbide mononitrate (IMDUR) 30 MG 24 hr tablet Take 1 tablet (30 mg total) by mouth daily. 30 tablet 1  . lidocaine-prilocaine (EMLA) cream Apply 1 application topically  See admin instructions. 1.5 hours prior use with chemotherapy  0  . Lidocaine-Prilocaine, Bulk, 2.5-2.5 % CREA Apply 5 mLs topically as needed. 1.5 hours before use with chemotherapy.Cover with plastic 30 g 3  . LORazepam (ATIVAN) 0.5 MG tablet Take 1 tablet by mouth every 6 hours as needed for nausea or vomiting. 30 tablet 1  . nitroGLYCERIN (NITROSTAT) 0.4 MG SL tablet Place 1 tablet (0.4 mg total) under the tongue every 5 (five) minutes as needed for chest pain. (Patient not taking: Reported on 08/31/2014) 25 tablet 12  . ondansetron (ZOFRAN) 8 MG tablet Take 1 tablet (8 mg total) by mouth 2 (two) times daily as needed for nausea or vomiting. Start on 3rd day after Chemotherapy (Patient not taking: Reported on 10/14/2014) 30  tablet 1  . potassium chloride (K-DUR) 10 MEQ tablet Take 1 tablet (10 mEq total) by mouth daily. 30 tablet 11  . prochlorperazine (COMPAZINE) 10 MG tablet Take 1 tablet (10 mg total) by mouth every 6 (six) hours as needed for nausea or vomiting. (Patient not taking: Reported on 09/11/2014) 30 tablet 1  . promethazine (PHENERGAN) 12.5 MG tablet Take 1-2 tablets (12.5-25 mg total) by mouth every 6 (six) hours as needed for nausea or vomiting. (Patient not taking: Reported on 10/14/2014) 40 tablet 2   No current facility-administered medications for this visit.    REVIEW OF SYSTEMS:   Constitutional: Denies fevers, chills or abnormal night sweats Eyes: Denies blurriness of vision, double vision or watery eyes Ears, nose, mouth, throat, and face: Denies mucositis or sore throat Respiratory: Denies cough, dyspnea or wheezes Cardiovascular: Denies palpitation, chest discomfort or lower extremity swelling Gastrointestinal:  Denies nausea, heartburn or change in bowel habits Skin: Denies abnormal skin rashes Lymphatics: Denies new lymphadenopathy or easy bruising Neurological:Denies numbness, tingling or new weaknesses Behavioral/Psych: Mood is stable, no new changes  All other  systems were reviewed with the patient and are negative.  PHYSICAL EXAMINATION: ECOG PERFORMANCE STATUS: 2  Filed Vitals:   10/22/14 1216  BP: 129/102  Pulse: 100  Temp: 99.4 F (37.4 C)  Resp: 18   Filed Weights   10/22/14 1216  Weight: 185 lb 1.6 oz (83.961 kg)    GENERAL:alert, no distress and comfortable SKIN: skin color, texture, turgor are normal, no rashes or significant lesions, previous skin ulcers in the area above her vagina has healed  EYES: normal, conjunctiva are pink and non-injected, sclera clear OROPHARYNX:no exudate, no erythema and lips, buccal mucosa, and tongue normal  NECK: supple, thyroid normal size, non-tender, without nodularity LYMPH:  no palpable lymphadenopathy in the cervical, axillary or inguinal LUNGS: clear to auscultation and percussion with normal breathing effort HEART: regular rate & rhythm and no murmurs and no lower extremity edema ABDOMEN:abdomen soft, non-tender and normal bowel sounds Musculoskeletal:no cyanosis of digits and no clubbing  PSYCH: alert & oriented x 3 with fluent speech NEURO: no focal motor/sensory deficits Breasts: Breast inspection showed them to be symmetrical with no nipple discharge. Slight left nipple retraction. A 1.8cm (imnitially 2.5 cm) mass behind the nipple. Palpation of the right breasts and both axillas revealed no obvious mass that I could appreciate.   LABORATORY DATA:  I have reviewed the data as listed CBC Latest Ref Rng 10/22/2014 10/14/2014 10/08/2014  WBC 3.9 - 10.3 10e3/uL 6.2 2.9(L) 5.8  Hemoglobin 11.6 - 15.9 g/dL 11.3(L) 12.3 11.7  Hematocrit 34.8 - 46.6 % 34.5(L) 36.4 35.6  Platelets 145 - 400 10e3/uL 195 226 345    CMP Latest Ref Rng 10/22/2014 10/14/2014 10/08/2014  Glucose 70 - 140 mg/dl 106 131(H) 130  BUN 7.0 - 26.0 mg/dL 10.2 19 11.6  Creatinine 0.6 - 1.1 mg/dL 1.0 1.08(H) 1.0  Sodium 136 - 145 mEq/L 144 138 141  Potassium 3.5 - 5.1 mEq/L 3.5 3.8 4.0  Chloride 101 - 111 mmol/L - 104  -  CO2 22 - 29 mEq/L $Remove'27 24 25  'orTyPsI$ Calcium 8.4 - 10.4 mg/dL 10.6(H) 10.7(H) 10.4  Total Protein 6.4 - 8.3 g/dL 6.5 7.3 6.7  Total Bilirubin 0.20 - 1.20 mg/dL <0.30 0.9 <0.30  Alkaline Phos 40 - 150 U/L 84 109 89  AST 5 - 34 U/L $Remo'9 18 11  'hkVEu$ ALT 0 - 55 U/L <9 13(L) 9   ANC 0.5 today  PATHOLOGY REPORT:  Diagnosis 08/06/2014 1. Breast, left, needle core biopsy, 9:30 o'clock, 1 CMFN - INVASIVE DUCTAL CARCINOMA, SEE COMMENT. 2. Lymph node, needle/core biopsy, left axillary lymph node - ONE LYMPH NODE, POSITIVE FOR METASTATIC MAMMARY CARCINOMA (1/1) 1. Although the grade of tumor is best assessed at resection, with these biopsies, the invasive tumor is grade 1. Breast prognostic studies are pending (part 1 only) and will be reported in an addendum. The case is reviewed with Dr. Raynald Blend who concurs. (CRR:gt, 08/07/14)  Results: IMMUNOHISTOCHEMICAL AND MORPHOMETRIC ANALYSIS BY THE AUTOMATED CELLULAR IMAGING SYSTEM (ACIS) Estrogen Receptor: 100%, POSITIVE, STRONG STAINING INTENSITY (PERFORMED MANUALLY) Progesterone Receptor: 5%, POSITIVE, WEAK STAINING INTENSITY Proliferation Marker Ki67: 50% (PERFORMED MANUALLY)  Results: HER2 - NEGATIVE RATIO OF HER2/CEP17 SIGNALS 1.47 AVERAGE HER2 COPY NUMBER PER CELL 2.35  RADIOGRAPHIC STUDIES: I have personally reviewed the radiological images as listed and agreed with the findings in the report.  US Breast Ltd Uni Left Inc Axilla 08/06/2014    FINDINGS: CC and MLO views of the left breast with tomosynthesis and CC and ML magnification views were performed. There is an approximately 2 cm irregular mass within the slightly medial, subareolar left breast. In addition, there are associated coarse heterogeneous calcifications. In total, the mass and calcifications span approximately 5.5 cm. A prominent lymph node is noted within the left axilla. Several adjacent oval, circumscribed low-density masses are noted within the left breast at 12 o'clock, middle depth.   Mammographic images were processed with CAD.  On physical exam, a palpable mass is noted within the left breast at 930, 1 cm from the nipple. No discrete mass is felt in the 12 o'clock region.  Targeted ultrasound is performed, showing an irregular hypoechoic mass at 930, 1 cm from the nipple measuring approximately 15 x 13 x 14 mm, corresponding to the palpable abnormality on exam. Dilated ducts extend from the mass to the level of the nipple, concerning for intraductal extension. The posterior extent of the calcifications seen mammographically is not well seen on ultrasound. At 12:30, 5 cm from the nipple, there is an oval, hypoechoic, circumscribed mass measuring approximately 8 x 8 x 2 mm which is thought to correspond to the largest of the oval low-density masses seen mammographically at 12 o'clock. No internal vascularity is identified.  Targeted exam of the left axilla demonstrates an abnormal appearing lymph node with cortical thickening measuring up to 5 mm.    IMPRESSION: 1. Highly suspicious left breast mass with associated pleomorphic calcifications which together span approximately 5.5 cm. Sonographic findings are concerning for intraductal extension of carcinoma to the level of the nipple. 2. Indeterminate left axillary lymph node with cortical thickening. 3. Circumscribed, low-density oval masses at 12 o'clock within the left breast.  RECOMMENDATION: 1. Ultrasound-guided core biopsy of the left breast mass at 930 and of the left axillary lymph node with cortical thickening. If biopsy of the left breast mass at 930 demonstrates malignancy, breast MRI is recommended for further evaluation of nipple involvement. 2. The patient's outside prior mammograms will be obtained for comparison. If the low density oval masses seen within the left breast at 12 o'clock are unchanged in appearance, no further evaluation is recommended. An addendum will be made when the comparison films are available.  I have  discussed the findings and recommendations with the patient. Results were also provided in writing at the conclusion of the visit. If applicable, a reminder letter will be sent to the patient regarding the next appointment.  BI-RADS CATEGORY  5: Highly suggestive of malignancy.   Electronically Signed   By: Pamelia Hoit M.D.   On: 08/06/2014 12:16   Breast MRI 08/25/2014 Right breast: No mass or abnormal enhancement.  Left breast: There is an irregular mass immediately posterior to the left nipple. Mass demonstrates rapid persistent type enhancement kinetics and extends to the base of the nipple. The within the mass there is tissue marker clip artifact following recent ultrasound-guided core biopsy. Posterior to the mass there is clumped linear non mass enhancement extending into the lower inner quadrant, suspicious for ductal carcinoma in situ. This enhancement correlates well with the suspicious microcalcifications seen mammographically. Mass and non mass enhancement measured together are 2.5 x 2.5 x 6.6 (anterior to posterior) cm. No additional sites of concern are identified within the left breast.  Lymph nodes: Enlarged lower left axillary lymph node is identified, associated with tissue marker clip following recent ultrasound-guided core biopsy. Additional normal morphology and left axillary lymph nodes are demonstrated. Right axillary lymph nodes and internal mammary chain are unremarkable in appearance.  Ancillary findings: None.  IMPRESSION: 1. Mass and non mass enhancement in the retroareolar and lower inner quadrant of the left breast. Enhancement from the mass does appear to involve the base of the left nipple. The extent of disease is felt to be well demonstrated mammographically. 2. Enlarged left axillary lymph node, corresponding to known metastatic disease as identified on recent core biopsy. 3. Right breast is negative.   Echo 08/24/2014 Study Conclusions  -  Left ventricle: The cavity size was normal. Wall thickness was normal. Systolic function was normal. The estimated ejection fraction was in the range of 55% to 60%. - Left atrium: The atrium was mildly dilated.  Bone scan 08/31/2014 IMPRESSION: 1. Tiny focus of increased uptake on the right at approximately T8. No abnormalities demonstrated on the coronal or sagittal images through the thoracic spine on the previous CT scan. Mild bilateral increased uptake at approximately T4 that is likely degenerative. 2. No abnormal uptake observed elsewhere.  CT chest, abdomen or pelvis 08/31/2014 IMPRESSION: 2 cm left breast mass, consistent with known primary breast carcinoma. No evidence of metastatic disease within the chest, abdomen, or pelvis.  Small hiatal hernia incidentally noted.    ASSESSMENT & PLAN:  59 year old African-American female, postmenopausal, healthy and fit, who was found to have a left breast mass and a biopsy confirmed node metastasis by screening mammogram.  1. Left breast invasive ductal carcinoma, cT3N1M0, stage IIIA, ER+/PR+/HER2- -I reviewed her mammograms, breast ultrasound and MRI findings and core needle biopsy results. -I reviewed her bone scan and CT scan results with her today. She has a very small uptake at T8 on bone scan, but no collar related changes on the CT scan. She is not symptomatic, I think the likelihood of bone metastasis is very low. CT scan was negative for any distant metastasis. - I recommend neoadjuvant chemotherapy. Giving the large primary tumor, node positive disease, high Ki-67, she has very high risk of cancer recurrence after surgery. -given her ER+/HER2- disease, I recommend dose dense Adriamycin and Cytoxan every 2 weeks for fourth cycle, followed by weekly Abraxane for 12 weeks. -Giving her positive lymph nodes, she would likely need adjuvant radiation, we'll refer her.  -Echocardiogram showed normal EF. -She had quite severe  nausea vomiting and diarrhea since last cycle chemotherapy, has not recovered well. I'll reschedule her chemotherapy to next week, with 5% dose reduction.  2. Nausea and vomiting  -She  had severe and prolonged nausea after last cycle chemotherapy -She has just filled her Emend and Phenergan prescriptions a few days ago, will use it (from day 4) after chemo next week,  she will also take dexamethasone daily for up to 5 days along with Emend after next cycle chemotherapy. -We discussed the importance of hydration after chemotherapy, especially when she has nausea and appetite. -I'll see her back one week after her next cycle chemotherapy  3. Cancer genetics She has very strong family history of breast, colon and pancreatic cancer, all in first-degree, I recommend her to see genetic counselor to ruled out inheritable breast cancer syndrome.  -her genetic testing result was normal  4. History of non-STEMI in 03/2014 -She had cardiac catheterization, which showed mild diffuse coronary artery disease, but no need intervention.  -She will follow-up with her cardiologist Dr. Burt Knack -We discussed some of the chemotherapy drug may cause heart failure, we'll repeat her echo after chemo, she may need follow-up with Dr. Burt Knack during her chemotherapy.   5. 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   Plan -hold chemo today, and postpone to 10/25, AC  5% dose reduction  -I'll see her back in 1 week after her next cycle [facility] on 11/1 with ivf   All questions were answered. The patient knows to call the clinic with any problems, questions or concerns. I spent 25 minutes counseling the patient face to face. The total time spent in the appointment was 30 minutes and more than 50% was on counseling.     Truitt Merle, MD 10/22/2014 12:51 PM

## 2014-10-22 NOTE — Telephone Encounter (Signed)
per pof to sch pt appt-sent MW email to sch trmt-will call pt after reply-gave copy of avs

## 2014-10-23 ENCOUNTER — Ambulatory Visit: Payer: Federal, State, Local not specified - PPO

## 2014-10-27 ENCOUNTER — Other Ambulatory Visit: Payer: Federal, State, Local not specified - PPO

## 2014-10-27 ENCOUNTER — Other Ambulatory Visit (HOSPITAL_BASED_OUTPATIENT_CLINIC_OR_DEPARTMENT_OTHER): Payer: Federal, State, Local not specified - PPO

## 2014-10-27 ENCOUNTER — Encounter: Payer: Self-pay | Admitting: *Deleted

## 2014-10-27 ENCOUNTER — Ambulatory Visit (HOSPITAL_BASED_OUTPATIENT_CLINIC_OR_DEPARTMENT_OTHER): Payer: Federal, State, Local not specified - PPO

## 2014-10-27 VITALS — BP 121/82 | HR 90 | Temp 98.7°F | Resp 16

## 2014-10-27 DIAGNOSIS — Z5111 Encounter for antineoplastic chemotherapy: Secondary | ICD-10-CM | POA: Diagnosis not present

## 2014-10-27 DIAGNOSIS — C50212 Malignant neoplasm of upper-inner quadrant of left female breast: Secondary | ICD-10-CM

## 2014-10-27 LAB — CBC WITH DIFFERENTIAL/PLATELET
BASO%: 0.8 % (ref 0.0–2.0)
BASOS ABS: 0 10*3/uL (ref 0.0–0.1)
EOS ABS: 0 10*3/uL (ref 0.0–0.5)
EOS%: 0.2 % (ref 0.0–7.0)
HCT: 34.7 % — ABNORMAL LOW (ref 34.8–46.6)
HGB: 11.2 g/dL — ABNORMAL LOW (ref 11.6–15.9)
LYMPH%: 15.1 % (ref 14.0–49.7)
MCH: 28.9 pg (ref 25.1–34.0)
MCHC: 32.3 g/dL (ref 31.5–36.0)
MCV: 89.4 fL (ref 79.5–101.0)
MONO#: 0.7 10*3/uL (ref 0.1–0.9)
MONO%: 13.5 % (ref 0.0–14.0)
NEUT#: 3.5 10*3/uL (ref 1.5–6.5)
NEUT%: 70.4 % (ref 38.4–76.8)
PLATELETS: 374 10*3/uL (ref 145–400)
RBC: 3.88 10*6/uL (ref 3.70–5.45)
RDW: 16.7 % — ABNORMAL HIGH (ref 11.2–14.5)
WBC: 5 10*3/uL (ref 3.9–10.3)
lymph#: 0.8 10*3/uL — ABNORMAL LOW (ref 0.9–3.3)

## 2014-10-27 LAB — COMPREHENSIVE METABOLIC PANEL (CC13)
ALK PHOS: 81 U/L (ref 40–150)
ALT: 17 U/L (ref 0–55)
ANION GAP: 9 meq/L (ref 3–11)
AST: 16 U/L (ref 5–34)
Albumin: 3.8 g/dL (ref 3.5–5.0)
BUN: 11.5 mg/dL (ref 7.0–26.0)
CO2: 25 meq/L (ref 22–29)
Calcium: 10.8 mg/dL — ABNORMAL HIGH (ref 8.4–10.4)
Chloride: 107 mEq/L (ref 98–109)
Creatinine: 1.1 mg/dL (ref 0.6–1.1)
EGFR: 63 mL/min/{1.73_m2} — AB (ref >90)
Glucose: 109 mg/dl (ref 70–140)
Potassium: 3.9 mEq/L (ref 3.5–5.1)
Sodium: 141 mEq/L (ref 136–145)
TOTAL PROTEIN: 6.7 g/dL (ref 6.4–8.3)

## 2014-10-27 MED ORDER — DOXORUBICIN HCL CHEMO IV INJECTION 2 MG/ML
55.0000 mg/m2 | Freq: Once | INTRAVENOUS | Status: AC
  Administered 2014-10-27: 114 mg via INTRAVENOUS
  Filled 2014-10-27: qty 57

## 2014-10-27 MED ORDER — SODIUM CHLORIDE 0.9 % IJ SOLN
10.0000 mL | INTRAMUSCULAR | Status: DC | PRN
  Administered 2014-10-27: 10 mL
  Filled 2014-10-27: qty 10

## 2014-10-27 MED ORDER — SODIUM CHLORIDE 0.9 % IV SOLN
Freq: Once | INTRAVENOUS | Status: AC
  Administered 2014-10-27: 15:00:00 via INTRAVENOUS
  Filled 2014-10-27: qty 5

## 2014-10-27 MED ORDER — SODIUM CHLORIDE 0.9 % IV SOLN
550.0000 mg/m2 | Freq: Once | INTRAVENOUS | Status: AC
  Administered 2014-10-27: 1140 mg via INTRAVENOUS
  Filled 2014-10-27: qty 57

## 2014-10-27 MED ORDER — SODIUM CHLORIDE 0.9 % IV SOLN
Freq: Once | INTRAVENOUS | Status: AC
  Administered 2014-10-27: 15:00:00 via INTRAVENOUS

## 2014-10-27 MED ORDER — PALONOSETRON HCL INJECTION 0.25 MG/5ML
0.2500 mg | Freq: Once | INTRAVENOUS | Status: AC
  Administered 2014-10-27: 0.25 mg via INTRAVENOUS

## 2014-10-27 MED ORDER — HEPARIN SOD (PORK) LOCK FLUSH 100 UNIT/ML IV SOLN
500.0000 [IU] | Freq: Once | INTRAVENOUS | Status: AC | PRN
  Administered 2014-10-27: 500 [IU]
  Filled 2014-10-27: qty 5

## 2014-10-27 MED ORDER — PALONOSETRON HCL INJECTION 0.25 MG/5ML
INTRAVENOUS | Status: AC
  Filled 2014-10-27: qty 5

## 2014-10-27 NOTE — Patient Instructions (Signed)
[facility] Discharge Instructions for Patients Receiving Chemotherapy  Today you received the following chemotherapy agents adriamycin, cytoxan  To help prevent nausea and vomiting after your treatment, we encourage you to take your nausea medication   If you develop nausea and vomiting that is not controlled by your nausea medication, call the clinic.   BELOW ARE SYMPTOMS THAT SHOULD BE REPORTED IMMEDIATELY:  *FEVER GREATER THAN 100.5 F  *CHILLS WITH OR WITHOUT FEVER  NAUSEA AND VOMITING THAT IS NOT CONTROLLED WITH YOUR NAUSEA MEDICATION  *UNUSUAL SHORTNESS OF BREATH  *UNUSUAL BRUISING OR BLEEDING  TENDERNESS IN MOUTH AND THROAT WITH OR WITHOUT PRESENCE OF ULCERS  *URINARY PROBLEMS  *BOWEL PROBLEMS  UNUSUAL RASH Items with * indicate a potential emergency and should be followed up as soon as possible.  Feel free to call the clinic you have any questions or concerns. The clinic phone number is [phone].  Please show the [facility] at check-in to the Emergency Department and triage nurse.

## 2014-10-28 ENCOUNTER — Encounter: Payer: Self-pay | Admitting: Hematology

## 2014-10-28 NOTE — Progress Notes (Signed)
Pt called in and wanted help with her bills. Pt states she needs help from cancer organizations. I scheduled an appointment to meet with her tomorrow at 11am. Pt will bring proof of income. I will go over everything when she comes in tomorrow.

## 2014-10-29 ENCOUNTER — Encounter: Payer: Self-pay | Admitting: Hematology

## 2014-10-29 ENCOUNTER — Ambulatory Visit: Payer: Federal, State, Local not specified - PPO

## 2014-10-29 ENCOUNTER — Ambulatory Visit (HOSPITAL_BASED_OUTPATIENT_CLINIC_OR_DEPARTMENT_OTHER): Payer: Federal, State, Local not specified - PPO

## 2014-10-29 ENCOUNTER — Other Ambulatory Visit: Payer: Federal, State, Local not specified - PPO

## 2014-10-29 VITALS — BP 128/80 | HR 81 | Temp 98.5°F

## 2014-10-29 DIAGNOSIS — Z5189 Encounter for other specified aftercare: Secondary | ICD-10-CM

## 2014-10-29 DIAGNOSIS — C50212 Malignant neoplasm of upper-inner quadrant of left female breast: Secondary | ICD-10-CM | POA: Diagnosis not present

## 2014-10-29 MED ORDER — PEGFILGRASTIM INJECTION 6 MG/0.6ML ~~LOC~~
6.0000 mg | PREFILLED_SYRINGE | Freq: Once | SUBCUTANEOUS | Status: AC
  Administered 2014-10-29: 6 mg via SUBCUTANEOUS
  Filled 2014-10-29: qty 0.6

## 2014-10-29 NOTE — Progress Notes (Signed)
Checked in pt for front desk for injection. Pt signed AOB. Also checked in pt for my Financial advocate appointment.

## 2014-10-29 NOTE — Progress Notes (Signed)
Pt had some financial concerns such as bills. Approved pt for $1,000 [employer]. Copy of approval given to pt as well as expenses form. Enrolled pt in [place] for copay assistance with Neualasta Injection. Gave pt applications for [employer] in [place] and [employer] to complete. Pt will bring back to me to submit along with supporting documentation. Pt has my card for any additional questions or concerns.

## 2014-11-01 ENCOUNTER — Emergency Department (HOSPITAL_COMMUNITY): Payer: Federal, State, Local not specified - PPO

## 2014-11-01 ENCOUNTER — Other Ambulatory Visit: Payer: Self-pay

## 2014-11-01 ENCOUNTER — Encounter (HOSPITAL_COMMUNITY): Payer: Self-pay | Admitting: Emergency Medicine

## 2014-11-01 ENCOUNTER — Emergency Department (HOSPITAL_COMMUNITY)
Admission: EM | Admit: 2014-11-01 | Discharge: 2014-11-01 | Disposition: A | Discharge status: Home / Self Care | Payer: Federal, State, Local not specified - PPO | Attending: Emergency Medicine | Admitting: Emergency Medicine

## 2014-11-01 DIAGNOSIS — Z8701 Personal history of pneumonia (recurrent): Secondary | ICD-10-CM | POA: Insufficient documentation

## 2014-11-01 DIAGNOSIS — Z9889 Other specified postprocedural states: Secondary | ICD-10-CM | POA: Insufficient documentation

## 2014-11-01 DIAGNOSIS — R109 Unspecified abdominal pain: Secondary | ICD-10-CM

## 2014-11-01 DIAGNOSIS — Z79899 Other long term (current) drug therapy: Secondary | ICD-10-CM | POA: Insufficient documentation

## 2014-11-01 DIAGNOSIS — I10 Essential (primary) hypertension: Secondary | ICD-10-CM | POA: Diagnosis not present

## 2014-11-01 DIAGNOSIS — R1033 Periumbilical pain: Secondary | ICD-10-CM | POA: Diagnosis not present

## 2014-11-01 DIAGNOSIS — M1711 Unilateral primary osteoarthritis, right knee: Secondary | ICD-10-CM | POA: Diagnosis not present

## 2014-11-01 DIAGNOSIS — R1013 Epigastric pain: Secondary | ICD-10-CM | POA: Diagnosis present

## 2014-11-01 DIAGNOSIS — R112 Nausea with vomiting, unspecified: Secondary | ICD-10-CM | POA: Diagnosis not present

## 2014-11-01 DIAGNOSIS — T451X5A Adverse effect of antineoplastic and immunosuppressive drugs, initial encounter: Secondary | ICD-10-CM | POA: Insufficient documentation

## 2014-11-01 DIAGNOSIS — C50919 Malignant neoplasm of unspecified site of unspecified female breast: Secondary | ICD-10-CM | POA: Diagnosis not present

## 2014-11-01 DIAGNOSIS — I252 Old myocardial infarction: Secondary | ICD-10-CM | POA: Insufficient documentation

## 2014-11-01 LAB — CBC WITH DIFFERENTIAL/PLATELET
BASOS ABS: 0 10*3/uL (ref 0.0–0.1)
BASOS PCT: 0 %
EOS ABS: 0 10*3/uL (ref 0.0–0.7)
Eosinophils Relative: 0 %
HCT: 34.4 % — ABNORMAL LOW (ref 36.0–46.0)
Hemoglobin: 11.5 g/dL — ABNORMAL LOW (ref 12.0–15.0)
LYMPHS ABS: 0.8 10*3/uL (ref 0.7–4.0)
Lymphocytes Relative: 6 %
MCH: 29.6 pg (ref 26.0–34.0)
MCHC: 33.4 g/dL (ref 30.0–36.0)
MCV: 88.4 fL (ref 78.0–100.0)
Monocytes Absolute: 0.1 10*3/uL (ref 0.1–1.0)
Monocytes Relative: 1 %
NEUTROS ABS: 12.7 10*3/uL — AB (ref 1.7–7.7)
Neutrophils Relative %: 93 %
Platelets: 254 10*3/uL (ref 150–400)
RBC: 3.89 MIL/uL (ref 3.87–5.11)
RDW: 15.8 % — AB (ref 11.5–15.5)
WBC: 13.6 10*3/uL — ABNORMAL HIGH (ref 4.0–10.5)

## 2014-11-01 LAB — COMPREHENSIVE METABOLIC PANEL
ALK PHOS: 93 U/L (ref 38–126)
ALT: 18 U/L (ref 14–54)
ANION GAP: 12 (ref 5–15)
AST: 20 U/L (ref 15–41)
Albumin: 3.9 g/dL (ref 3.5–5.0)
BILIRUBIN TOTAL: 1.1 mg/dL (ref 0.3–1.2)
BUN: 17 mg/dL (ref 6–20)
CALCIUM: 10.6 mg/dL — AB (ref 8.9–10.3)
CO2: 23 mmol/L (ref 22–32)
Chloride: 105 mmol/L (ref 101–111)
Creatinine, Ser: 0.91 mg/dL (ref 0.44–1.00)
GFR calc Af Amer: >60 mL/min (ref >60)
Glucose, Bld: 97 mg/dL (ref 65–99)
POTASSIUM: 3.4 mmol/L — AB (ref 3.5–5.1)
Sodium: 140 mmol/L (ref 135–145)
TOTAL PROTEIN: 6.9 g/dL (ref 6.5–8.1)

## 2014-11-01 LAB — TROPONIN I
Troponin I: <0.03 ng/mL (ref <0.031)
Troponin I: <0.03 ng/mL (ref <0.031)

## 2014-11-01 LAB — I-STAT TROPONIN, ED: TROPONIN I, POC: 0 ng/mL (ref 0.00–0.08)

## 2014-11-01 LAB — LIPASE, BLOOD: LIPASE: 17 U/L (ref 11–51)

## 2014-11-01 MED ORDER — ONDANSETRON HCL 4 MG/2ML IJ SOLN
4.0000 mg | Freq: Once | INTRAMUSCULAR | Status: AC
  Administered 2014-11-01: 4 mg via INTRAVENOUS
  Filled 2014-11-01: qty 2

## 2014-11-01 MED ORDER — GI COCKTAIL ~~LOC~~: 30.0000 mL | Freq: Once | ORAL | Status: DC

## 2014-11-01 MED ORDER — LORAZEPAM 0.5 MG PO TABS
0.5000 mg | ORAL_TABLET | Freq: Once | ORAL | Status: AC
  Administered 2014-11-01: 0.5 mg via ORAL
  Filled 2014-11-01: qty 1

## 2014-11-01 MED ORDER — SODIUM CHLORIDE 0.9 % IV SOLN
INTRAVENOUS | Status: DC
  Administered 2014-11-01: 16:00:00 via INTRAVENOUS

## 2014-11-01 MED ORDER — MORPHINE SULFATE (PF) 4 MG/ML IV SOLN
4.0000 mg | Freq: Once | INTRAVENOUS | Status: AC
  Administered 2014-11-01: 4 mg via INTRAVENOUS
  Filled 2014-11-01: qty 1

## 2014-11-01 MED ORDER — IOHEXOL 300 MG/ML  SOLN
100.0000 mL | Freq: Once | INTRAMUSCULAR | Status: AC | PRN
  Administered 2014-11-01: 100 mL via INTRAVENOUS

## 2014-11-01 NOTE — ED Notes (Signed)
Pt c/o CP (central) x 2 hrs.  Pt states that she has breast cancer (last chemo was Tuesday).  States that she has been constipated and was unsure if the pain was from gas.  Hx of MI.  Took a nitro which "slightly relieved her pain".

## 2014-11-01 NOTE — ED Notes (Signed)
While vomiting pt became tachycardic at [place].  As vomiting resolved pt's HR decreased from 140's to 112.

## 2014-11-01 NOTE — ED Provider Notes (Signed)
CSN: [id number]     Arrival date & time 11/01/14  1455 History   First MD Initiated Contact with Patient 11/01/14 1516     Chief Complaint  Patient presents with  . 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   Mary Randall is a 59 y.o.  female  Patient presents to the hospital via private care with complaints of abdominal pain that is periumbilical and epigastric. The pain started two hours prior to arrival while she was resting. She describes her pain as the a different quality than her NSTEMI and much more mild. She took nitro with a small amount of relief. She felt as though her pain was gas and has had some belching that improved her pain. Her pain is a pressure and constant 4-5/10. Marland Kitchen She has not had any associated N/V/D, fevers, chills, weakness, back pain, abdominal pains, coughing, SOB, LE swelling, weight gain/weight loss, headache, vision changes, aphagia, ataxia or any other associated symptoms.  Last chemo treatment was this past Tuesday. It is typical for her to have some abdominal pains and nausea and vomiting after chemo for which she typically takes Ativan, she denies taking any in the past week but is not sure why she hasn't tried it. Her oncologist is Dr. Burr Medico at [facility].  -given her ER+/HER2- disease, I recommend dose dense Adriamycin and Cytoxan every 2 weeks for fourth cycle, followed by weekly Abraxane for 12 weeks.  Past Medical History  Diagnosis Date  . Hypertension   . Angina effort ([place])   . NSTEMI (non-ST elevated myocardial infarction) ([place]) 03/16/2014    non-obs CAD, spasm seen  . Pneumonia 2009  . Arthritis     "right knee" (03/16/2014)  . 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 Breast cancer ([place]) 2016  . Family  history of breast cancer   . Family history of pancreatic cancer   . Family history of breast cancer in female    Past Surgical History  Procedure Laterality Date  . Other surgical history  1991    Tubal pregnancy  . Cardiac catheterization  2010; 03/16/2014  . Tubal ligation  1981  . Left heart catheterization with coronary angiogram N/A 03/16/2014    Procedure: LEFT HEART CATHETERIZATION WITH CORONARY ANGIOGRAM;  Surgeon: Sherren Mocha, MD; Non-obs CAD, EF 55%  . Bunionectomy Bilateral   . Breast biopsy Left 08/06/14  . Portacath placement N/A 08/28/2014    Procedure: INSERTION PORT-A-CATH;  Surgeon: Autumn Messing III, MD;  Location: [facility] OR;  Service: General;  Laterality: N/A;   Family History  Problem Relation Age of Onset  . Pancreatic cancer Brother 36  . Cancer Brother 40    breast cancer   . Cancer Mother 65    colon cancer   . Hypertension Mother   . Diabetes type II Mother   . CVA Father   . Healthy Sister   . Healthy Sister    Social History  Substance Use Topics  . Smoking status: Never Smoker   . Smokeless tobacco: Never Used  . Alcohol Use: No   OB History    No data available     Review of Systems  10 Systems reviewed and are negative for acute change except as noted in the HPI.     Allergies  Codeine; Carvedilol; Compazine;  and Percocet  Home Medications   Prior to Admission medications   Medication Sig Start Date End Date Taking? Authorizing Provider  acyclovir (ZOVIRAX) 400 MG tablet Take 1 tablet (400 mg total) by mouth 3 (three) times daily. Patient taking differently: Take 400 mg by mouth 3 (three) times daily as needed (take for 5 days for herpes breakout/flare ups).  10/01/14  Yes Truitt Merle, MD  amLODipine (NORVASC) 10 MG tablet Take 1 tablet (10 mg total) by mouth daily. 09/16/14  Yes Sherren Mocha, MD  aprepitant (EMEND) 80 MG capsule Take 1 capsule (80 mg total) by mouth daily. Start the day after chemotherapy. Patient taking differently: Take 80  mg by mouth daily as needed (nausea/vomitting). start 2 days after and take for 3 days then stop until next round of chemo 10/01/14  Yes Truitt Merle, MD  atorvastatin (LIPITOR) 40 MG tablet Take 1 tablet (40 mg total) by mouth daily at 6 PM. 03/17/14  Yes Rhonda G Barrett, PA-C  dexamethasone (DECADRON) 4 MG tablet Take 2 pills once the day after chemotherapy and then 2 times a day for 2 days. Take with food 09/02/14  Yes Truitt Merle, MD  hydrochlorothiazide (HYDRODIURIL) 25 MG tablet Take 25 mg by mouth daily.   Yes Historical Provider, MD  irbesartan (AVAPRO) 300 MG tablet Take 1 tablet (300 mg total) by mouth daily. 09/16/14  Yes Sherren Mocha, MD  isosorbide mononitrate (IMDUR) 30 MG 24 hr tablet Take 1 tablet (30 mg total) by mouth daily. 10/06/14  Yes Sherren Mocha, MD  Lidocaine-Prilocaine, Bulk, 2.5-2.5 % CREA Apply 5 mLs topically as needed. 1.5 hours before use with chemotherapy.Cover with plastic Patient taking differently: Apply 5 mLs topically as needed (port access). 1.5 hours before use with chemotherapy.Cover with plastic 09/02/14  Yes Truitt Merle, MD  LORazepam (ATIVAN) 0.5 MG tablet Take 1 tablet by mouth every 6 hours as needed for nausea or vomiting. 10/01/14  Yes Truitt Merle, MD  nitroGLYCERIN (NITROSTAT) 0.4 MG SL tablet Place 1 tablet (0.4 mg total) under the tongue every 5 (five) minutes as needed for chest pain. 03/17/14  Yes Rhonda G Barrett, PA-C  ondansetron (ZOFRAN) 8 MG tablet Take 1 tablet (8 mg total) by mouth 2 (two) times daily as needed for nausea or vomiting. Start on 3rd day after Chemotherapy 09/02/14  Yes Truitt Merle, MD  pegfilgrastim (NEULASTA) 6 MG/0.6ML injection Inject 6 mg into the skin as directed. One injection given 48 hours after chemo at [facility]   Yes Historical Provider, MD  potassium chloride (K-DUR) 10 MEQ tablet Take 1 tablet (10 mEq total) by mouth daily. 03/17/14  Yes Rhonda G Barrett, PA-C  PRESCRIPTION MEDICATION Chemo - [place]   Yes Historical Provider, MD  promethazine  (PHENERGAN) 12.5 MG tablet Take 1-2 tablets (12.5-25 mg total) by mouth every 6 (six) hours as needed for nausea or vomiting. 10/01/14  Yes Truitt Merle, MD  prochlorperazine (COMPAZINE) 10 MG tablet Take 1 tablet (10 mg total) by mouth every 6 (six) hours as needed for nausea or vomiting. Patient not taking: Reported on 09/11/2014 09/02/14   Truitt Merle, MD   BP 128/92 mmHg  Pulse 85  Temp(Src) 99 F (37.2 C) (Oral)  Resp 18  SpO2 100% Physical Exam  Constitutional: She is oriented to person, place, and time. She appears well-developed and well-nourished. No distress.  HENT:  Head: Normocephalic and atraumatic.  Right Ear: Tympanic membrane normal.  Left Ear: Tympanic membrane normal.  Nose: Nose normal.  Mouth/Throat:  Uvula is midline and oropharynx is clear and moist.  Eyes: Pupils are equal, round, and reactive to light.  Neck: Normal range of motion. Neck supple.  Cardiovascular: Normal rate and regular rhythm.   Pulmonary/Chest: Effort normal and breath sounds normal. No accessory muscle usage. No respiratory distress. She has no decreased breath sounds. She has no wheezes. She has no rhonchi.  No chest wall tenderness.  Abdominal: Soft. Bowel sounds are normal. There is tenderness (very mild) in the epigastric area and periumbilical area. There is no rigidity, no rebound, no guarding and no CVA tenderness.  No abdominal pain, distention or guarding. No epigastric pain. Unable to reproduce pain.  Musculoskeletal:  No lower extremity swelling or rash  Neurological: She is alert and oriented to person, place, and time.  Skin: Skin is warm and dry.  Psychiatric:  + tearful  Nursing note and vitals reviewed.   ED Course  Procedures (including critical care time) Labs Review Labs Reviewed  CBC WITH DIFFERENTIAL/PLATELET - Abnormal; Notable for the following:    WBC 13.6 (*)    Hemoglobin 11.5 (*)    HCT 34.4 (*)    RDW 15.8 (*)    Neutro Abs 12.7 (*)    All other components within  normal limits  COMPREHENSIVE METABOLIC PANEL - Abnormal; Notable for the following:    Potassium 3.4 (*)    Calcium 10.6 (*)    All other components within normal limits  TROPONIN I  LIPASE, BLOOD  TROPONIN I  I-STAT TROPOININ, ED    Imaging Review Ct Abdomen Pelvis W Contrast  11/01/2014  CLINICAL DATA:  Nausea and vomiting today. On chemotherapy for breast cancer. EXAM: CT ABDOMEN AND PELVIS WITH CONTRAST TECHNIQUE: Multidetector CT imaging of the abdomen and pelvis was performed using the standard protocol following bolus administration of intravenous contrast. CONTRAST:  142m OMNIPAQUE IOHEXOL 300 MG/ML  SOLN COMPARISON:  CT abdomen and pelvis August 29th 2016 FINDINGS: LUNG BASES: RIGHT level lobe atelectasis. Included heart size is normal. Mild pericardial thickening. SOLID ORGANS: The liver, spleen, gallbladder, and adrenal glands are unremarkable. Predominately fatty replaced pancreas. GASTROINTESTINAL TRACT: Small to moderate hiatal hernia. The stomach, small and large bowel are normal in course and caliber without inflammatory changes. Normal appendix. KIDNEYS/ URINARY TRACT: Kidneys are orthotopic, demonstrating symmetric enhancement. No nephrolithiasis, hydronephrosis or solid renal masses. Too small to characterize hypodensities in the kidneys bilaterally. 11 mm RIGHT upper pole cyst. The unopacified ureters are normal in course and caliber. Delayed imaging through the kidneys demonstrates symmetric prompt contrast excretion within the proximal urinary collecting system. Duplicated LEFT renal collecting system, incompletely characterized. Urinary bladder is partially distended and unremarkable. PERITONEUM/RETROPERITONEUM: Aortoiliac vessels are normal in course and caliber. No lymphadenopathy by CT size criteria. Internal reproductive organs are unremarkable. No intraperitoneal free fluid nor free air. Phleboliths in the pelvis. SOFT TISSUE/OSSEOUS STRUCTURES: Non-suspicious. Moderate  sacroiliac osteoarthrosis. Moderate lower lumbar facet arthropathy. Small fat containing umbilical hernia. IMPRESSION: No acute intra-abdominal or pelvic process. Duplicated LEFT renal collecting system, incompletely evaluated, no obstructive uropathy. Small to moderate fat containing umbilical hernia. Electronically Signed   By: CElon AlasM.D.   On: 11/01/2014 21:36   Dg Abd Acute W/chest  11/01/2014  CLINICAL DATA:  Acute onset of mid chest pain and constipation. Initial encounter. EXAM: DG ABDOMEN ACUTE W/ 1V CHEST COMPARISON:  CT of the chest, abdomen and pelvis performed 08/31/2014 FINDINGS: The lungs are well-aerated and clear. There is no evidence of focal opacification, pleural effusion  or pneumothorax. The cardiomediastinal silhouette is within normal limits. A right-sided chest port is noted ending about the mid SVC. The visualized bowel gas pattern is unremarkable. Scattered stool and air are seen within the colon; there is no evidence of small bowel dilatation to suggest obstruction. No free intra-abdominal air is identified on the provided upright view. No acute osseous abnormalities are seen; the sacroiliac joints are unremarkable in appearance. IMPRESSION: 1. Unremarkable bowel gas pattern; no free intra-abdominal air seen. Small to moderate amount of stool noted in the colon. 2. No acute cardiopulmonary process seen. Electronically Signed   By: Garald Balding M.D.   On: 11/01/2014 16:04   I have personally reviewed and evaluated these images and lab results as part of my medical decision-making.   EKG Interpretation   Date/Time:  Sunday November 01 2014 16:32:15 EDT Ventricular Rate:  74 PR Interval:  175 QRS Duration: 92 QT Interval:  357 QTC Calculation: 396 R Axis:   47 Text Interpretation:  Sinus rhythm Low voltage, precordial leads  Nonspecific T abnormalities, diffuse leads No significant change since  last tracing 3 Sept 2014 Confirmed by RAY MD, Andee Poles [phone]) on   11/01/2014 4:36:10 PM     VIRGINA, DEAKINS [id number] 01-Nov-2014 16:32:15 [facility]-ED ROUTINE RECORD Sinus rhythm Low voltage, precordial leads Nonspecific T abnormalities, diffuse leads No significant change since last tracing 3 Sept 2014 Confirmed by RAY MD, Andee Poles [phone]) on 11/01/2014 4:36:10 PM 28m/s 122mmV _0  8.0 SP2 CID: [id number] by: Confirmed By: DAPattricia BossD Vent. rate 74 BPM PR interval 175 ms QRS duration 92 ms QT/QTc 357/396 ms P-R-T axes 52  MDM   Final diagnoses:  Chemotherapy induced nausea and vomiting  Abdominal pain, unspecified abdominal location    Patient is not having chest pain, she points to her abdomen. Umbilical and epigastric region when explaining that she has chest pain. She does have some mild tenderness to her abdomen without guarding. She's had 2 episodes of vomiting during her stay which improved greatly after the pain medicine and nausea medication. Notes precaution EKG, chest x-ray troponin were done-- normal chest xray, neg trop, neg delta trop and no significant findings to EKG. She is currently taking dexamethasone for 1 week after her chemo which is the most likely cause of elevated WBC at 13.6 -- i spoke with on-call oncology who reports that the Neulasta and dexamethasone can raise the WBC as high as 30 on CBC. He reports he will pass along the message to Dr. FeBurr Medicond will be able to be seen in clinic tomorrow.  Long discussion with daughter and patient, they feel home is the best option due to risk of infection as outpatient. They were given option for admission but elect for home. Strict return to ED precautions.  Medications  0.9 %  sodium chloride infusion ( Intravenous Stopped 11/01/14 1726)  LORazepam (ATIVAN) tablet 0.5 mg (not administered)  morphine 4 MG/ML injection 4 mg (4 mg Intravenous Given 11/01/14 1601)  ondansetron (ZOFRAN) injection 4 mg (4 mg Intravenous Given 11/01/14 1601)  ondansetron  (ZOFRAN) injection 4 mg (4 mg Intravenous Given 11/01/14 1725)  iohexol (OMNIPAQUE) 300 MG/ML solution 100 mL (100 mLs Intravenous Contrast Given 11/01/14 2053)    [id number].Shaleka V Briney's medical screening exam was performed and I feel the patient has had an appropriate workup for their chief complaint at this time and likelihood of emergent condition existing is low. They have been counseled on decision, discharge, follow up  and which symptoms necessitate immediate return to the emergency department. They or their family verbally stated understanding and agreement with plan and discharged in stable condition.   Vital signs are stable at discharge. Filed Vitals:   11/01/14 2200  BP: 128/92  Pulse: 85  Temp:   Resp: [address], PA-C 11/01/14 2229  Pattricia Boss, MD 11/01/14 [id number]

## 2014-11-01 NOTE — ED Notes (Signed)
Patient transported to CT 

## 2014-11-03 ENCOUNTER — Ambulatory Visit: Payer: Federal, State, Local not specified - PPO

## 2014-11-03 ENCOUNTER — Ambulatory Visit (HOSPITAL_BASED_OUTPATIENT_CLINIC_OR_DEPARTMENT_OTHER): Payer: Federal, State, Local not specified - PPO | Admitting: Hematology

## 2014-11-03 ENCOUNTER — Telehealth: Payer: Self-pay | Admitting: Hematology

## 2014-11-03 ENCOUNTER — Other Ambulatory Visit (HOSPITAL_BASED_OUTPATIENT_CLINIC_OR_DEPARTMENT_OTHER): Payer: Federal, State, Local not specified - PPO

## 2014-11-03 ENCOUNTER — Encounter: Payer: Self-pay | Admitting: Hematology

## 2014-11-03 VITALS — BP 99/73 | HR 90 | Temp 98.7°F | Resp 19 | Ht 67.0 in | Wt 183.3 lb

## 2014-11-03 DIAGNOSIS — C50212 Malignant neoplasm of upper-inner quadrant of left female breast: Secondary | ICD-10-CM

## 2014-11-03 DIAGNOSIS — D6481 Anemia due to antineoplastic chemotherapy: Secondary | ICD-10-CM

## 2014-11-03 DIAGNOSIS — R112 Nausea with vomiting, unspecified: Secondary | ICD-10-CM

## 2014-11-03 DIAGNOSIS — T451X5A Adverse effect of antineoplastic and immunosuppressive drugs, initial encounter: Secondary | ICD-10-CM

## 2014-11-03 LAB — CBC WITH DIFFERENTIAL/PLATELET
BASO%: 1.3 % (ref 0.0–2.0)
BASOS ABS: 0 10*3/uL (ref 0.0–0.1)
EOS ABS: 0 10*3/uL (ref 0.0–0.5)
EOS%: 1.3 % (ref 0.0–7.0)
HEMATOCRIT: 31.5 % — AB (ref 34.8–46.6)
HEMOGLOBIN: 10.3 g/dL — AB (ref 11.6–15.9)
LYMPH#: 0.5 10*3/uL — AB (ref 0.9–3.3)
LYMPH%: 31 % (ref 14.0–49.7)
MCH: 29.3 pg (ref 25.1–34.0)
MCHC: 32.8 g/dL (ref 31.5–36.0)
MCV: 89.3 fL (ref 79.5–101.0)
MONO#: 0 10*3/uL — AB (ref 0.1–0.9)
MONO%: 2.6 % (ref 0.0–14.0)
NEUT%: 63.8 % (ref 38.4–76.8)
NEUTROS ABS: 1 10*3/uL — AB (ref 1.5–6.5)
PLATELETS: 167 10*3/uL (ref 145–400)
RBC: 3.52 10*6/uL — ABNORMAL LOW (ref 3.70–5.45)
RDW: 16.8 % — AB (ref 11.2–14.5)
WBC: 1.6 10*3/uL — AB (ref 3.9–10.3)

## 2014-11-03 LAB — COMPREHENSIVE METABOLIC PANEL (CC13)
ALBUMIN: 3.4 g/dL — AB (ref 3.5–5.0)
ALK PHOS: 81 U/L (ref 40–150)
ALT: 13 U/L (ref 0–55)
AST: 10 U/L (ref 5–34)
Anion Gap: 9 mEq/L (ref 3–11)
BILIRUBIN TOTAL: 0.74 mg/dL (ref 0.20–1.20)
BUN: 12.7 mg/dL (ref 7.0–26.0)
CALCIUM: 10.5 mg/dL — AB (ref 8.4–10.4)
CO2: 26 mEq/L (ref 22–29)
Chloride: 107 mEq/L (ref 98–109)
Creatinine: 0.9 mg/dL (ref 0.6–1.1)
EGFR: 87 mL/min/{1.73_m2} — AB (ref >90)
GLUCOSE: 106 mg/dL (ref 70–140)
Potassium: 4.1 mEq/L (ref 3.5–5.1)
SODIUM: 142 meq/L (ref 136–145)
TOTAL PROTEIN: 6.2 g/dL — AB (ref 6.4–8.3)

## 2014-11-03 NOTE — Telephone Encounter (Signed)
Gave adn printed appt sched and avs for pt for NOv

## 2014-11-03 NOTE — Progress Notes (Signed)
[place]  [phone] [phone]  Clinic follow up Note   Patient Care Team: Katherina Mires, MD as PCP - General (Family Medicine) Autumn Messing III, MD as Consulting Physician (General Surgery) Sherren Mocha, MD as Consulting Physician (Cardiology) 11/03/2014  CHIEF COMPLAINTS:  Follow up left breast cancer   Oncology History   Breast cancer of upper-inner quadrant of left female breast   Staging form: Breast, AJCC 7th Edition     Clinical: Stage IIIA (T3, N1, M0) - Unsigned       Breast cancer of upper-inner quadrant of left female breast ([place])   08/06/2014 Initial Diagnosis Breast cancer of upper-inner quadrant of left female breast   08/06/2014 Initial Biopsy Left breast 9:30 o'clock position showed invasive ductal carcinoma, and 1 left axillary lymph node biopsy showed positive for metastatic carcinoma.   08/06/2014 Receptors her2 ER 100% positive, PR 5% positive, Ki-67 50%   08/06/2014 Mammogram Diagnostic mammogram and ultrasound showed left breast mass with associated calcification measuring 5.5 cm, indeterminate 8 left axillary lymph node was cortical thickening, circumscribed low density oval 19mm mass at 12:00 within the left breast.   09/03/2014 -  Adjuvant Chemotherapy ddAC every 2 weeks X4, followed by weekly Taxol X12     HISTORY OF PRESENTING ILLNESS:  Mary Randall 59 y.o. female is here because of recently newly diagnosed left breast cancer. She presents to the clinic with her daughter.  This was discovered by screening mammogram. Her prior mammogram was 2 years ago. She noticed mild left nipple retraction, no palpable mass, no other changes. She feels well overall, denies any significant pain except mild right knee pain from arthritis. She denies any change of her appetite, or recent weight loss. She works for post office, Horticulturist, commercial, and is physically active.   Her family history is notable for breast cancer in her brother, pancreatic  cancer in another brother, and colon cancer in her mother. She is postmenopausal, last menstrual period 8 years ago. She has a daughter and a son. She lives with her brother, and her daughter is likely going to stay with her.  INTERIM HISTORY Mary Randall returns for follow up after her 4th cycle AC. I reduced her dose by 8% last time, her nausea and vomiting was not as bad as before, but she was still quite fatigued with little appetite. She developed mid sternal chest pain 2 days ago, not exertional, persistent for several hours, she was evaluated at emergency room, cardiac workup was negative, CT scan was also negative. She will discharged to home. The pain resolved. She overall feels slightly better, with moderate fatigue, slightly improved appetite, she is drinking adequately.  MEDICAL HISTORY:  Past Medical History  Diagnosis Date  . Hypertension   . Angina effort ([place])   . NSTEMI (non-ST elevated myocardial infarction) ([place]) 03/16/2014    non-obs CAD, spasm seen  . Pneumonia 2009  . Arthritis     "right knee" (03/16/2014)  . 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 Breast cancer ([place]) 2016  . Family history of breast cancer   . Family history of pancreatic cancer   . Family history of breast cancer in female     SURGICAL HISTORY: Past Surgical History  Procedure Laterality Date  . Other surgical history  1991    Tubal pregnancy  . Cardiac catheterization  2010; 03/16/2014  . Tubal ligation  1981  . Left heart catheterization with coronary angiogram N/A  03/16/2014    Procedure: LEFT HEART CATHETERIZATION WITH CORONARY ANGIOGRAM;  Surgeon: Sherren Mocha, MD; Non-obs CAD, EF 55%  . Bunionectomy Bilateral   . Breast biopsy Left 08/06/14  . Portacath placement N/A 08/28/2014    Procedure: INSERTION PORT-A-CATH;  Surgeon: Autumn Messing III, MD;  Location: [facility];  Service: General;  Laterality: N/A;   GYN HISTORY  Menarchal: 15 LMP: 51 Contraceptive: 3 HRT: no G2P2:  no breast feeding     SOCIAL HISTORY: Social History   Social History  . Marital Status: Legally Separated    Spouse Name: N/A  . Number of Children: Daughter 105, son 106   . Years of Education: N/A   Occupational History  . Works for post office    Social History Main Topics  . Smoking status: Never Smoker   . Smokeless tobacco: Never Used  . Alcohol Use: No  . Drug Use: No  . Sexual Activity: No   Other Topics Concern  . Not on file   Social History Narrative    FAMILY HISTORY: Family History  Problem Relation Age of Onset  . Pancreatic cancer Brother 50  . Cancer Brother 40    breast cancer   . Cancer Mother 26    colon cancer   . Hypertension Mother   . Diabetes type II Mother   . CVA Father   . Healthy Sister   . Healthy Sister     ALLERGIES:  is allergic to codeine; carvedilol; compazine; and percocet.  MEDICATIONS:  Current Outpatient Prescriptions  Medication Sig Dispense Refill  . amLODipine (NORVASC) 10 MG tablet Take 1 tablet (10 mg total) by mouth daily. 30 tablet 3  . aprepitant (EMEND) 80 MG capsule Take 1 capsule (80 mg total) by mouth daily. Start the day after chemotherapy. (Patient taking differently: Take 80 mg by mouth daily as needed (nausea/vomitting). start 2 days after and take for 3 days then stop until next round of chemo) 3 capsule 1  . atorvastatin (LIPITOR) 40 MG tablet Take 1 tablet (40 mg total) by mouth daily at 6 PM. 30 tablet 11  . hydrochlorothiazide (HYDRODIURIL) 25 MG tablet Take 25 mg by mouth daily.    . irbesartan (AVAPRO) 300 MG tablet Take 1 tablet (300 mg total) by mouth daily. 30 tablet 2  . isosorbide mononitrate (IMDUR) 30 MG 24 hr tablet Take 1 tablet (30 mg total) by mouth daily. 30 tablet 1  . Lidocaine-Prilocaine, Bulk, 2.5-2.5 % CREA Apply 5 mLs topically as needed. 1.5 hours before use with chemotherapy.Cover with plastic (Patient taking differently: Apply 5 mLs topically as needed (port access). 1.5 hours before  use with chemotherapy.Cover with plastic) 30 g 3  . LORazepam (ATIVAN) 0.5 MG tablet Take 1 tablet by mouth every 6 hours as needed for nausea or vomiting. 30 tablet 1  . ondansetron (ZOFRAN) 8 MG tablet Take 1 tablet (8 mg total) by mouth 2 (two) times daily as needed for nausea or vomiting. Start on 3rd day after Chemotherapy 30 tablet 1  . pegfilgrastim (NEULASTA) 6 MG/0.6ML injection Inject 6 mg into the skin as directed. One injection given 48 hours after chemo at [facility]    . potassium chloride (K-DUR) 10 MEQ tablet Take 1 tablet (10 mEq total) by mouth daily. 30 tablet 11  . PRESCRIPTION MEDICATION Chemo - [facility]    . prochlorperazine (COMPAZINE) 10 MG tablet Take 1 tablet (10 mg total) by mouth every 6 (six) hours as needed for nausea or vomiting.  30 tablet 1  . promethazine (PHENERGAN) 12.5 MG tablet Take 1-2 tablets (12.5-25 mg total) by mouth every 6 (six) hours as needed for nausea or vomiting. 40 tablet 2  . acyclovir (ZOVIRAX) 400 MG tablet Take 1 tablet (400 mg total) by mouth 3 (three) times daily. (Patient not taking: Reported on 11/03/2014) 30 tablet 1  . dexamethasone (DECADRON) 4 MG tablet Take 2 pills once the day after chemotherapy and then 2 times a day for 2 days. Take with food (Patient not taking: Reported on 11/03/2014) 30 tablet 1  . nitroGLYCERIN (NITROSTAT) 0.4 MG SL tablet Place 1 tablet (0.4 mg total) under the tongue every 5 (five) minutes as needed for chest pain. (Patient not taking: Reported on 11/03/2014) 25 tablet 12   No current facility-administered medications for this visit.    REVIEW OF SYSTEMS:   Constitutional: Denies fevers, chills or abnormal night sweats Eyes: Denies blurriness of vision, double vision or watery eyes Ears, nose, mouth, throat, and face: Denies mucositis or sore throat Respiratory: Denies cough, dyspnea or wheezes Cardiovascular: Denies palpitation, chest discomfort or lower extremity swelling Gastrointestinal:  Denies nausea, heartburn or  change in bowel habits Skin: Denies abnormal skin rashes Lymphatics: Denies new lymphadenopathy or easy bruising Neurological:Denies numbness, tingling or new weaknesses Behavioral/Psych: Mood is stable, no new changes  All other systems were reviewed with the patient and are negative.  PHYSICAL EXAMINATION: ECOG PERFORMANCE STATUS: 2  Filed Vitals:   11/03/14 0904  BP: 99/73  Pulse: 90  Temp: 98.7 F (37.1 C)  Resp: 19   Filed Weights   11/03/14 0904  Weight: 183 lb 4.8 oz (83.144 kg)    GENERAL:alert, no distress and comfortable SKIN: skin color, texture, turgor are normal, no rashes or significant lesions, previous skin ulcers in the area above her vagina has healed  EYES: normal, conjunctiva are pink and non-injected, sclera clear OROPHARYNX:no exudate, no erythema and lips, buccal mucosa, and tongue normal  NECK: supple, thyroid normal size, non-tender, without nodularity LYMPH:  no palpable lymphadenopathy in the cervical, axillary or inguinal LUNGS: clear to auscultation and percussion with normal breathing effort HEART: regular rate & rhythm and no murmurs and no lower extremity edema ABDOMEN:abdomen soft, non-tender and normal bowel sounds Musculoskeletal:no cyanosis of digits and no clubbing  PSYCH: alert & oriented x 3 with fluent speech NEURO: no focal motor/sensory deficits Breasts: Breast inspection showed them to be symmetrical with no nipple discharge. Slight left nipple retraction. A 1.8cm (imnitially 2.5 cm) mass behind the nipple. Palpation of the right breasts and both axillas revealed no obvious mass that I could appreciate.   LABORATORY DATA:  I have reviewed the data as listed CBC Latest Ref Rng 11/03/2014 11/01/2014 10/27/2014  WBC 3.9 - 10.3 10e3/uL 1.6(L) 13.6(H) 5.0  Hemoglobin 11.6 - 15.9 g/dL 10.3(L) 11.5(L) 11.2(L)  Hematocrit 34.8 - 46.6 % 31.5(L) 34.4(L) 34.7(L)  Platelets 145 - 400 10e3/uL 167 254 374    CMP Latest Ref Rng 11/03/2014  11/01/2014 10/27/2014  Glucose 70 - 140 mg/dl 106 97 109  BUN 7.0 - 26.0 mg/dL 12.7 17 11.5  Creatinine 0.6 - 1.1 mg/dL 0.9 0.91 1.1  Sodium 136 - 145 mEq/L 142 140 141  Potassium 3.5 - 5.1 mEq/L 4.1 3.4(L) 3.9  Chloride 101 - 111 mmol/L - 105 -  CO2 22 - 29 mEq/L $Remove'26 23 25  'qMCJWkr$ Calcium 8.4 - 10.4 mg/dL 10.5(H) 10.6(H) 10.8(H)  Total Protein 6.4 - 8.3 g/dL 6.2(L) 6.9 6.7  Total Bilirubin  0.20 - 1.20 mg/dL 0.74 1.1 <0.30  Alkaline Phos 40 - 150 U/L 81 93 81  AST 5 - 34 U/L $Remo'10 20 16  'mwqya$ ALT 0 - 55 U/L $Remo'13 18 17   'Eznee$ ANC 0.5 today    PATHOLOGY REPORT:  Diagnosis 08/06/2014 1. Breast, left, needle core biopsy, 9:30 o'clock, 1 CMFN - INVASIVE DUCTAL CARCINOMA, SEE COMMENT. 2. Lymph node, needle/core biopsy, left axillary lymph node - ONE LYMPH NODE, POSITIVE FOR METASTATIC MAMMARY CARCINOMA (1/1) 1. Although the grade of tumor is best assessed at resection, with these biopsies, the invasive tumor is grade 1. Breast prognostic studies are pending (part 1 only) and will be reported in an addendum. The case is reviewed with Dr. Avis Epley who concurs. (CRR:gt, 08/07/14)  Results: IMMUNOHISTOCHEMICAL AND MORPHOMETRIC ANALYSIS BY THE AUTOMATED CELLULAR IMAGING SYSTEM (ACIS) Estrogen Receptor: 100%, POSITIVE, STRONG STAINING INTENSITY (PERFORMED MANUALLY) Progesterone Receptor: 5%, POSITIVE, WEAK STAINING INTENSITY Proliferation Marker Ki67: 50% (PERFORMED MANUALLY)  Results: HER2 - NEGATIVE RATIO OF HER2/CEP17 SIGNALS 1.47 AVERAGE HER2 COPY NUMBER PER CELL 2.35  RADIOGRAPHIC STUDIES: I have personally reviewed the radiological images as listed and agreed with the findings in the report.  US Breast Ltd Uni Left Inc Axilla 08/06/2014    FINDINGS: CC and MLO views of the left breast with tomosynthesis and CC and ML magnification views were performed. There is an approximately 2 cm irregular mass within the slightly medial, subareolar left breast. In addition, there are associated coarse heterogeneous  calcifications. In total, the mass and calcifications span approximately 5.5 cm. A prominent lymph node is noted within the left axilla. Several adjacent oval, circumscribed low-density masses are noted within the left breast at 12 o'clock, middle depth.  Mammographic images were processed with CAD.  On physical exam, a palpable mass is noted within the left breast at 930, 1 cm from the nipple. No discrete mass is felt in the 12 o'clock region.  Targeted ultrasound is performed, showing an irregular hypoechoic mass at 930, 1 cm from the nipple measuring approximately 15 x 13 x 14 mm, corresponding to the palpable abnormality on exam. Dilated ducts extend from the mass to the level of the nipple, concerning for intraductal extension. The posterior extent of the calcifications seen mammographically is not well seen on ultrasound. At 12:30, 5 cm from the nipple, there is an oval, hypoechoic, circumscribed mass measuring approximately 8 x 8 x 2 mm which is thought to correspond to the largest of the oval low-density masses seen mammographically at 12 o'clock. No internal vascularity is identified.  Targeted exam of the left axilla demonstrates an abnormal appearing lymph node with cortical thickening measuring up to 5 mm.    IMPRESSION: 1. Highly suspicious left breast mass with associated pleomorphic calcifications which together span approximately 5.5 cm. Sonographic findings are concerning for intraductal extension of carcinoma to the level of the nipple. 2. Indeterminate left axillary lymph node with cortical thickening. 3. Circumscribed, low-density oval masses at 12 o'clock within the left breast.  RECOMMENDATION: 1. Ultrasound-guided core biopsy of the left breast mass at 930 and of the left axillary lymph node with cortical thickening. If biopsy of the left breast mass at 930 demonstrates malignancy, breast MRI is recommended for further evaluation of nipple involvement. 2. The patient's outside prior  mammograms will be obtained for comparison. If the low density oval masses seen within the left breast at 12 o'clock are unchanged in appearance, no further evaluation is recommended. An addendum will be made when the  comparison films are available.  I have discussed the findings and recommendations with the patient. Results were also provided in writing at the conclusion of the visit. If applicable, a reminder letter will be sent to the patient regarding the next appointment.  BI-RADS CATEGORY  5: Highly suggestive of malignancy.   Electronically Signed   By: Pamelia Hoit M.D.   On: 08/06/2014 12:16   Breast MRI 08/25/2014 Right breast: No mass or abnormal enhancement.  Left breast: There is an irregular mass immediately posterior to the left nipple. Mass demonstrates rapid persistent type enhancement kinetics and extends to the base of the nipple. The within the mass there is tissue marker clip artifact following recent ultrasound-guided core biopsy. Posterior to the mass there is clumped linear non mass enhancement extending into the lower inner quadrant, suspicious for ductal carcinoma in situ. This enhancement correlates well with the suspicious microcalcifications seen mammographically. Mass and non mass enhancement measured together are 2.5 x 2.5 x 6.6 (anterior to posterior) cm. No additional sites of concern are identified within the left breast.  Lymph nodes: Enlarged lower left axillary lymph node is identified, associated with tissue marker clip following recent ultrasound-guided core biopsy. Additional normal morphology and left axillary lymph nodes are demonstrated. Right axillary lymph nodes and internal mammary chain are unremarkable in appearance.  Ancillary findings: None.  IMPRESSION: 1. Mass and non mass enhancement in the retroareolar and lower inner quadrant of the left breast. Enhancement from the mass does appear to involve the base of the left nipple. The extent  of disease is felt to be well demonstrated mammographically. 2. Enlarged left axillary lymph node, corresponding to known metastatic disease as identified on recent core biopsy. 3. Right breast is negative.   Echo 08/24/2014 Study Conclusions  - Left ventricle: The cavity size was normal. Wall thickness was normal. Systolic function was normal. The estimated ejection fraction was in the range of 55% to 60%. - Left atrium: The atrium was mildly dilated.  Bone scan 08/31/2014 IMPRESSION: 1. Tiny focus of increased uptake on the right at approximately T8. No abnormalities demonstrated on the coronal or sagittal images through the thoracic spine on the previous CT scan. Mild bilateral increased uptake at approximately T4 that is likely degenerative. 2. No abnormal uptake observed elsewhere.  CT chest, abdomen or pelvis 08/31/2014 IMPRESSION: 2 cm left breast mass, consistent with known primary breast carcinoma. No evidence of metastatic disease within the chest, abdomen, or pelvis.  Small hiatal hernia incidentally noted.    ASSESSMENT & PLAN:  59 year old African-American female, postmenopausal, healthy and fit, who was found to have a left breast mass and a biopsy confirmed node metastasis by screening mammogram.  1. Left breast invasive ductal carcinoma, cT3N1M0, stage IIIA, ER+/PR+/HER2- -I reviewed her mammograms, breast ultrasound and MRI findings and core needle biopsy results. -I reviewed her bone scan and CT scan results with her today. She has a very small uptake at T8 on bone scan, but no collar related changes on the CT scan. She is not symptomatic, I think the likelihood of bone metastasis is very low. CT scan was negative for any distant metastasis. - I recommend neoadjuvant chemotherapy. Giving the large primary tumor, node positive disease, high Ki-67, she has very high risk of cancer recurrence after surgery. -given her ER+/HER2- disease, I recommend dose  dense Adriamycin and Cytoxan every 2 weeks for fourth cycle, followed by weekly Abraxane for 12 weeks. -Giving her positive lymph nodes, she would likely need adjuvant  radiation, we'll refer her.  -Echocardiogram showed normal EF. -She tolerated the last cycle AC moderately well, lab reviewed, she is neutropenic, I initially schedule her for IV fluids, but she declined today, she is drinking adequately, vital signs are stable. -She will return next week to start weekly Taxol. Potential side effect of Taxol was reviewed with her again today.  2. Nausea, anorexia and fatigue -secondary to chemo  -Improved with her last cycle chemotherapy -Continue monitoring  3. Cancer genetics She has very strong family history of breast, colon and pancreatic cancer, all in first-degree, I recommend her to see genetic counselor to ruled out inheritable breast cancer syndrome.  -her genetic testing result was normal  4. History of non-STEMI in 03/2014 -She had cardiac catheterization, which showed mild diffuse coronary artery disease, but no need intervention.  -She will follow-up with her cardiologist Dr. Burt Knack -We discussed some of the chemotherapy drug may cause heart failure, we'll repeat her echo after chemo, she may need follow-up with Dr. Burt Knack during her chemotherapy.   5. Herpes in perivaginal area  -resolved  -Continue prophylactic acyclovir 400 mg twice daily until she completes chemotherapy.   Plan -She will return next week to start weekly Taxol -I'll see her back in 2 weeks  All questions were answered. The patient knows to call the clinic with any problems, questions or concerns. I spent 15 minutes counseling the patient face to face. The total time spent in the appointment was 20 minutes and more than 50% was on counseling.     Truitt Merle, MD 11/03/2014 10:29 AM

## 2014-11-10 ENCOUNTER — Ambulatory Visit (HOSPITAL_BASED_OUTPATIENT_CLINIC_OR_DEPARTMENT_OTHER): Payer: Federal, State, Local not specified - PPO

## 2014-11-10 ENCOUNTER — Encounter: Payer: Self-pay | Admitting: *Deleted

## 2014-11-10 ENCOUNTER — Other Ambulatory Visit (HOSPITAL_BASED_OUTPATIENT_CLINIC_OR_DEPARTMENT_OTHER): Payer: Federal, State, Local not specified - PPO

## 2014-11-10 VITALS — BP 151/100 | HR 90 | Temp 98.0°F | Resp 16

## 2014-11-10 DIAGNOSIS — C773 Secondary and unspecified malignant neoplasm of axilla and upper limb lymph nodes: Secondary | ICD-10-CM | POA: Diagnosis not present

## 2014-11-10 DIAGNOSIS — Z5111 Encounter for antineoplastic chemotherapy: Secondary | ICD-10-CM

## 2014-11-10 DIAGNOSIS — C50212 Malignant neoplasm of upper-inner quadrant of left female breast: Secondary | ICD-10-CM

## 2014-11-10 LAB — CBC WITH DIFFERENTIAL/PLATELET
BASO%: 0.2 % (ref 0.0–2.0)
BASOS ABS: 0 10*3/uL (ref 0.0–0.1)
EOS%: 0.1 % (ref 0.0–7.0)
Eosinophils Absolute: 0 10*3/uL (ref 0.0–0.5)
HEMATOCRIT: 31.3 % — AB (ref 34.8–46.6)
HEMOGLOBIN: 10.2 g/dL — AB (ref 11.6–15.9)
LYMPH#: 0.8 10*3/uL — AB (ref 0.9–3.3)
LYMPH%: 11.6 % — ABNORMAL LOW (ref 14.0–49.7)
MCH: 29.5 pg (ref 25.1–34.0)
MCHC: 32.6 g/dL (ref 31.5–36.0)
MCV: 90.4 fL (ref 79.5–101.0)
MONO#: 0.6 10*3/uL (ref 0.1–0.9)
MONO%: 9.4 % (ref 0.0–14.0)
NEUT#: 5.2 10*3/uL (ref 1.5–6.5)
NEUT%: 78.7 % — AB (ref 38.4–76.8)
PLATELETS: 205 10*3/uL (ref 145–400)
RBC: 3.46 10*6/uL — ABNORMAL LOW (ref 3.70–5.45)
RDW: 17.5 % — AB (ref 11.2–14.5)
WBC: 6.7 10*3/uL (ref 3.9–10.3)

## 2014-11-10 LAB — COMPREHENSIVE METABOLIC PANEL (CC13)
ALBUMIN: 3.5 g/dL (ref 3.5–5.0)
ALK PHOS: 79 U/L (ref 40–150)
ALT: 13 U/L (ref 0–55)
ANION GAP: 10 meq/L (ref 3–11)
AST: 11 U/L (ref 5–34)
BUN: 6.6 mg/dL — AB (ref 7.0–26.0)
CALCIUM: 10.5 mg/dL — AB (ref 8.4–10.4)
CHLORIDE: 108 meq/L (ref 98–109)
CO2: 26 mEq/L (ref 22–29)
CREATININE: 0.9 mg/dL (ref 0.6–1.1)
EGFR: 82 mL/min/{1.73_m2} — ABNORMAL LOW (ref >90)
Glucose: 126 mg/dl (ref 70–140)
Potassium: 3.4 mEq/L — ABNORMAL LOW (ref 3.5–5.1)
Sodium: 144 mEq/L (ref 136–145)
Total Protein: 6.2 g/dL — ABNORMAL LOW (ref 6.4–8.3)

## 2014-11-10 MED ORDER — SODIUM CHLORIDE 0.9 % IJ SOLN
10.0000 mL | INTRAMUSCULAR | Status: DC | PRN
  Administered 2014-11-10: 10 mL
  Filled 2014-11-10: qty 10

## 2014-11-10 MED ORDER — HEPARIN SOD (PORK) LOCK FLUSH 100 UNIT/ML IV SOLN
500.0000 [IU] | Freq: Once | INTRAVENOUS | Status: AC | PRN
  Administered 2014-11-10: 500 [IU]
  Filled 2014-11-10: qty 5

## 2014-11-10 MED ORDER — DIPHENHYDRAMINE HCL 50 MG/ML IJ SOLN
25.0000 mg | Freq: Once | INTRAMUSCULAR | Status: AC
Start: 2014-11-10 — End: 2014-11-10
  Administered 2014-11-10: 25 mg via INTRAVENOUS

## 2014-11-10 MED ORDER — SODIUM CHLORIDE 0.9 % IV SOLN
Freq: Once | INTRAVENOUS | Status: AC
  Administered 2014-11-10: 11:00:00 via INTRAVENOUS

## 2014-11-10 MED ORDER — FAMOTIDINE IN NACL 20-0.9 MG/50ML-% IV SOLN
INTRAVENOUS | Status: AC
  Filled 2014-11-10: qty 50

## 2014-11-10 MED ORDER — PACLITAXEL CHEMO INJECTION 300 MG/50ML
80.0000 mg/m2 | Freq: Once | INTRAVENOUS | Status: AC
  Administered 2014-11-10: 156 mg via INTRAVENOUS
  Filled 2014-11-10: qty 26

## 2014-11-10 MED ORDER — FAMOTIDINE IN NACL 20-0.9 MG/50ML-% IV SOLN
20.0000 mg | Freq: Once | INTRAVENOUS | Status: AC
  Administered 2014-11-10: 20 mg via INTRAVENOUS

## 2014-11-10 MED ORDER — DIPHENHYDRAMINE HCL 50 MG/ML IJ SOLN
INTRAMUSCULAR | Status: AC
  Filled 2014-11-10: qty 1

## 2014-11-10 MED ORDER — SODIUM CHLORIDE 0.9 % IV SOLN
Freq: Once | INTRAVENOUS | Status: AC
  Administered 2014-11-10: 11:00:00 via INTRAVENOUS
  Filled 2014-11-10: qty 4

## 2014-11-10 NOTE — Progress Notes (Signed)
Per patient, she did not take her blood pressure medications prior to appt today. Dr. Burr Medico notified of last BP reading of 150/100 prior to patient being discharged from [facility]. Per Dr. Burr Medico, okay to discharge patient home. Patient instructed to take BP medications when she gets home and to report to the ED if she experiences any headaches, blurred vision, dizziness or any other signs of high blood pressure. Patient agreeable to this. Also, instructed patient to take her BP home medications prior to coming for taxol chemotherapy treatments in the future - patient verbalizes understanding.

## 2014-11-10 NOTE — Patient Instructions (Signed)
[facility] Discharge Instructions for Patients Receiving Chemotherapy  Today you received the following chemotherapy agents: Taxol  To help prevent nausea and vomiting after your treatment, we encourage you to take your nausea medication as prescribed.   If you develop nausea and vomiting that is not controlled by your nausea medication, call the clinic.   BELOW ARE SYMPTOMS THAT SHOULD BE REPORTED IMMEDIATELY:  *FEVER GREATER THAN 100.5 F  *CHILLS WITH OR WITHOUT FEVER  NAUSEA AND VOMITING THAT IS NOT CONTROLLED WITH YOUR NAUSEA MEDICATION  *UNUSUAL SHORTNESS OF BREATH  *UNUSUAL BRUISING OR BLEEDING  TENDERNESS IN MOUTH AND THROAT WITH OR WITHOUT PRESENCE OF ULCERS  *URINARY PROBLEMS  *BOWEL PROBLEMS  UNUSUAL RASH Items with * indicate a potential emergency and should be followed up as soon as possible.  Feel free to call the clinic you have any questions or concerns. The clinic phone number is [phone].  Please show the [place] at check-in to the Emergency Department and triage nurse.  Paclitaxel injection What is this medicine? PACLITAXEL (PAK li TAX el) is a chemotherapy drug. It targets fast dividing cells, like cancer cells, and causes these cells to die. This medicine is used to treat ovarian cancer, breast cancer, and other cancers. This medicine may be used for other purposes; ask your health care provider or pharmacist if you have questions. What should I tell my health care provider before I take this medicine? They need to know if you have any of these conditions: -blood disorders -irregular heartbeat -infection (especially a virus infection such as chickenpox, cold sores, or herpes) -liver disease -previous or ongoing radiation therapy -an unusual or allergic reaction to paclitaxel, alcohol, polyoxyethylated castor oil, other chemotherapy agents, other medicines, foods, dyes, or preservatives -pregnant or trying to get  pregnant -breast-feeding How should I use this medicine? This drug is given as an infusion into a vein. It is administered in a hospital or clinic by a specially trained health care professional. Talk to your pediatrician regarding the use of this medicine in children. Special care may be needed. Overdosage: If you think you have taken too much of this medicine contact a poison control center or emergency room at once. NOTE: This medicine is only for you. Do not share this medicine with others. What if I miss a dose? It is important not to miss your dose. Call your doctor or health care professional if you are unable to keep an appointment. What may interact with this medicine? Do not take this medicine with any of the following medications: -disulfiram -metronidazole This medicine may also interact with the following medications: -cyclosporine -diazepam -ketoconazole -medicines to increase blood counts like filgrastim, pegfilgrastim, sargramostim -other chemotherapy drugs like cisplatin, doxorubicin, epirubicin, etoposide, teniposide, vincristine -quinidine -testosterone -vaccines -verapamil Talk to your doctor or health care professional before taking any of these medicines: -acetaminophen -aspirin -ibuprofen -ketoprofen -naproxen This list may not describe all possible interactions. Give your health care provider a list of all the medicines, herbs, non-prescription drugs, or dietary supplements you use. Also tell them if you smoke, drink alcohol, or use illegal drugs. Some items may interact with your medicine. What should I watch for while using this medicine? Your condition will be monitored carefully while you are receiving this medicine. You will need important blood work done while you are taking this medicine. This drug may make you feel generally unwell. This is not uncommon, as chemotherapy can affect healthy cells as well as cancer  cells. Report any side effects. Continue  your course of treatment even though you feel ill unless your doctor tells you to stop. This medicine can cause serious allergic reactions. To reduce your risk you will need to take other medicine(s) before treatment with this medicine. In some cases, you may be given additional medicines to help with side effects. Follow all directions for their use. Call your doctor or health care professional for advice if you get a fever, chills or sore throat, or other symptoms of a cold or flu. Do not treat yourself. This drug decreases your body's ability to fight infections. Try to avoid being around people who are sick. This medicine may increase your risk to bruise or bleed. Call your doctor or health care professional if you notice any unusual bleeding. Be careful brushing and flossing your teeth or using a toothpick because you may get an infection or bleed more easily. If you have any dental work done, tell your dentist you are receiving this medicine. Avoid taking products that contain aspirin, acetaminophen, ibuprofen, naproxen, or ketoprofen unless instructed by your doctor. These medicines may hide a fever. Do not become pregnant while taking this medicine. Women should inform their doctor if they wish to become pregnant or think they might be pregnant. There is a potential for serious side effects to an unborn child. Talk to your health care professional or pharmacist for more information. Do not breast-feed an infant while taking this medicine. Men are advised not to father a child while receiving this medicine. This product may contain alcohol. Ask your pharmacist or healthcare provider if this medicine contains alcohol. Be sure to tell all healthcare providers you are taking this medicine. Certain medicines, like metronidazole and disulfiram, can cause an unpleasant reaction when taken with alcohol. The reaction includes flushing, headache, nausea, vomiting, sweating, and increased thirst. The reaction  can last from 30 minutes to several hours. What side effects may I notice from receiving this medicine? Side effects that you should report to your doctor or health care professional as soon as possible: -allergic reactions like skin rash, itching or hives, swelling of the face, lips, or tongue -low blood counts - This drug may decrease the number of white blood cells, red blood cells and platelets. You may be at increased risk for infections and bleeding. -signs of infection - fever or chills, cough, sore throat, pain or difficulty passing urine -signs of decreased platelets or bleeding - bruising, pinpoint red spots on the skin, black, tarry stools, nosebleeds -signs of decreased red blood cells - unusually weak or tired, fainting spells, lightheadedness -breathing problems -chest pain -high or low blood pressure -mouth sores -nausea and vomiting -pain, swelling, redness or irritation at the injection site -pain, tingling, numbness in the hands or feet -slow or irregular heartbeat -swelling of the ankle, feet, hands Side effects that usually do not require medical attention (report to your doctor or health care professional if they continue or are bothersome): -bone pain -complete hair loss including hair on your head, underarms, pubic hair, eyebrows, and eyelashes -changes in the color of fingernails -diarrhea -loosening of the fingernails -loss of appetite -muscle or joint pain -red flush to skin -sweating This list may not describe all possible side effects. Call your doctor for medical advice about side effects. You may report side effects to FDA at 1-800-FDA-1088. Where should I keep my medicine? This drug is given in a hospital or clinic and will not be stored at home. NOTE:  This sheet is a summary. It may not cover all possible information. If you have questions about this medicine, talk to your doctor, pharmacist, or health care provider.    2016, [employer].  (2014-08-06 13:02:56)

## 2014-11-12 ENCOUNTER — Ambulatory Visit: Payer: Federal, State, Local not specified - PPO

## 2014-11-13 ENCOUNTER — Telehealth: Payer: Self-pay | Admitting: *Deleted

## 2014-11-13 NOTE — Telephone Encounter (Signed)
-----   Message from Christa See, RN sent at 11/10/2014  1:57 PM EST ----- Regarding: 1st time taxol - Dr. Burr Medico Pt had first time taxol today without any problems. Please f/u with patient - she had hard time with previous A/C chemo. Thanks!

## 2014-11-13 NOTE — Telephone Encounter (Signed)
Called pt to discuss how she did with her chemotherapy & she reports that she did well with no problems.  She is eating well, no bowel problems, & sleeping OK.  Reminded that she can call for any questions or concerns. She was appreciative of call.

## 2014-11-17 ENCOUNTER — Other Ambulatory Visit (HOSPITAL_BASED_OUTPATIENT_CLINIC_OR_DEPARTMENT_OTHER): Payer: Federal, State, Local not specified - PPO

## 2014-11-17 ENCOUNTER — Telehealth: Payer: Self-pay | Admitting: Hematology

## 2014-11-17 ENCOUNTER — Encounter: Payer: Self-pay | Admitting: Hematology

## 2014-11-17 ENCOUNTER — Ambulatory Visit (HOSPITAL_BASED_OUTPATIENT_CLINIC_OR_DEPARTMENT_OTHER): Payer: Federal, State, Local not specified - PPO

## 2014-11-17 ENCOUNTER — Ambulatory Visit (HOSPITAL_BASED_OUTPATIENT_CLINIC_OR_DEPARTMENT_OTHER): Payer: Federal, State, Local not specified - PPO | Admitting: Hematology

## 2014-11-17 VITALS — BP 111/72 | HR 104 | Temp 98.1°F | Resp 20 | Ht 67.0 in | Wt 183.8 lb

## 2014-11-17 DIAGNOSIS — C50212 Malignant neoplasm of upper-inner quadrant of left female breast: Secondary | ICD-10-CM

## 2014-11-17 DIAGNOSIS — C773 Secondary and unspecified malignant neoplasm of axilla and upper limb lymph nodes: Secondary | ICD-10-CM

## 2014-11-17 DIAGNOSIS — Z5111 Encounter for antineoplastic chemotherapy: Secondary | ICD-10-CM

## 2014-11-17 DIAGNOSIS — R530 Neoplastic (malignant) related fatigue: Secondary | ICD-10-CM | POA: Diagnosis not present

## 2014-11-17 LAB — COMPREHENSIVE METABOLIC PANEL (CC13)
ALT: 22 U/L (ref 0–55)
AST: 15 U/L (ref 5–34)
Albumin: 3.6 g/dL (ref 3.5–5.0)
Alkaline Phosphatase: 69 U/L (ref 40–150)
Anion Gap: 10 mEq/L (ref 3–11)
BILIRUBIN TOTAL: 0.35 mg/dL (ref 0.20–1.20)
BUN: 13 mg/dL (ref 7.0–26.0)
CALCIUM: 10.7 mg/dL — AB (ref 8.4–10.4)
CHLORIDE: 106 meq/L (ref 98–109)
CO2: 25 meq/L (ref 22–29)
CREATININE: 1 mg/dL (ref 0.6–1.1)
EGFR: 72 mL/min/{1.73_m2} — ABNORMAL LOW (ref >90)
Glucose: 112 mg/dl (ref 70–140)
Potassium: 3.9 mEq/L (ref 3.5–5.1)
Sodium: 141 mEq/L (ref 136–145)
TOTAL PROTEIN: 6.5 g/dL (ref 6.4–8.3)

## 2014-11-17 LAB — CBC WITH DIFFERENTIAL/PLATELET
BASO%: 0.6 % (ref 0.0–2.0)
Basophils Absolute: 0 10*3/uL (ref 0.0–0.1)
EOS%: 0.8 % (ref 0.0–7.0)
Eosinophils Absolute: 0 10*3/uL (ref 0.0–0.5)
HEMATOCRIT: 31.5 % — AB (ref 34.8–46.6)
HGB: 10.2 g/dL — ABNORMAL LOW (ref 11.6–15.9)
LYMPH#: 0.9 10*3/uL (ref 0.9–3.3)
LYMPH%: 25.1 % (ref 14.0–49.7)
MCH: 29.7 pg (ref 25.1–34.0)
MCHC: 32.4 g/dL (ref 31.5–36.0)
MCV: 91.6 fL (ref 79.5–101.0)
MONO#: 0.4 10*3/uL (ref 0.1–0.9)
MONO%: 10.6 % (ref 0.0–14.0)
NEUT%: 62.9 % (ref 38.4–76.8)
NEUTROS ABS: 2.3 10*3/uL (ref 1.5–6.5)
Platelets: 335 10*3/uL (ref 145–400)
RBC: 3.44 10*6/uL — AB (ref 3.70–5.45)
RDW: 17 % — ABNORMAL HIGH (ref 11.2–14.5)
WBC: 3.6 10*3/uL — AB (ref 3.9–10.3)

## 2014-11-17 MED ORDER — DIPHENHYDRAMINE HCL 50 MG/ML IJ SOLN
INTRAMUSCULAR | Status: AC
  Filled 2014-11-17: qty 1

## 2014-11-17 MED ORDER — SODIUM CHLORIDE 0.9 % IV SOLN
Freq: Once | INTRAVENOUS | Status: AC
  Administered 2014-11-17: 11:00:00 via INTRAVENOUS

## 2014-11-17 MED ORDER — SODIUM CHLORIDE 0.9 % IV SOLN
Freq: Once | INTRAVENOUS | Status: AC
  Administered 2014-11-17: 11:00:00 via INTRAVENOUS
  Filled 2014-11-17: qty 4

## 2014-11-17 MED ORDER — DIPHENHYDRAMINE HCL 50 MG/ML IJ SOLN
25.0000 mg | Freq: Once | INTRAMUSCULAR | Status: AC
Start: 2014-11-17 — End: 2014-11-17
  Administered 2014-11-17: 25 mg via INTRAVENOUS

## 2014-11-17 MED ORDER — SODIUM CHLORIDE 0.9 % IJ SOLN
10.0000 mL | INTRAMUSCULAR | Status: DC | PRN
  Administered 2014-11-17: 10 mL
  Filled 2014-11-17: qty 10

## 2014-11-17 MED ORDER — PACLITAXEL CHEMO INJECTION 300 MG/50ML
80.0000 mg/m2 | Freq: Once | INTRAVENOUS | Status: AC
  Administered 2014-11-17: 156 mg via INTRAVENOUS
  Filled 2014-11-17: qty 26

## 2014-11-17 MED ORDER — FAMOTIDINE IN NACL 20-0.9 MG/50ML-% IV SOLN
20.0000 mg | Freq: Once | INTRAVENOUS | Status: AC
  Administered 2014-11-17: 20 mg via INTRAVENOUS

## 2014-11-17 MED ORDER — HEPARIN SOD (PORK) LOCK FLUSH 100 UNIT/ML IV SOLN
500.0000 [IU] | Freq: Once | INTRAVENOUS | Status: AC | PRN
  Administered 2014-11-17: 500 [IU]
  Filled 2014-11-17: qty 5

## 2014-11-17 MED ORDER — FAMOTIDINE IN NACL 20-0.9 MG/50ML-% IV SOLN
INTRAVENOUS | Status: AC
  Filled 2014-11-17: qty 50

## 2014-11-17 NOTE — Telephone Encounter (Signed)
Gave and printed appt sched and avs for pt for NOV thru DEC °

## 2014-11-17 NOTE — Patient Instructions (Signed)
[facility] Discharge Instructions for Patients Receiving Chemotherapy  Today you received the following chemotherapy agents: Taxol.   To help prevent nausea and vomiting after your treatment, we encourage you to take your nausea medication: Compazine. Take one every 6 hours as needed. You may also take Zofran every 8 hours if needed.   If you develop nausea and vomiting that is not controlled by your nausea medication, call the clinic.   BELOW ARE SYMPTOMS THAT SHOULD BE REPORTED IMMEDIATELY:  *FEVER GREATER THAN 100.5 F  *CHILLS WITH OR WITHOUT FEVER  NAUSEA AND VOMITING THAT IS NOT CONTROLLED WITH YOUR NAUSEA MEDICATION  *UNUSUAL SHORTNESS OF BREATH  *UNUSUAL BRUISING OR BLEEDING  TENDERNESS IN MOUTH AND THROAT WITH OR WITHOUT PRESENCE OF ULCERS  *URINARY PROBLEMS  *BOWEL PROBLEMS  UNUSUAL RASH Items with * indicate a potential emergency and should be followed up as soon as possible.  Feel free to call the clinic should you have any questions or concerns. The clinic phone number is [phone].  Please show the [facility] at check-in to the Emergency Department and triage nurse.

## 2014-11-17 NOTE — Progress Notes (Signed)
[place] [place]  [phone] [phone]  Clinic follow up Note   Patient Care Team: Katherina Mires, MD as PCP - General (Family Medicine) Autumn Messing III, MD as Consulting Physician (General Surgery) Sherren Mocha, MD as Consulting Physician (Cardiology) 11/17/2014  CHIEF COMPLAINTS:  Follow up left breast cancer   Oncology History   Breast cancer of upper-inner quadrant of left female breast   Staging form: Breast, AJCC 7th Edition     Clinical: Stage IIIA (T3, N1, M0) - Unsigned       Breast cancer of upper-inner quadrant of left female breast ([place])   08/06/2014 Initial Diagnosis Breast cancer of upper-inner quadrant of left female breast   08/06/2014 Initial Biopsy Left breast 9:30 o'clock position showed invasive ductal carcinoma, and 1 left axillary lymph node biopsy showed positive for metastatic carcinoma.   08/06/2014 Receptors her2 ER 100% positive, PR 5% positive, Ki-67 50%   08/06/2014 Mammogram Diagnostic mammogram and ultrasound showed left breast mass with associated calcification measuring 5.5 cm, indeterminate 8 left axillary lymph node was cortical thickening, circumscribed low density oval 70mm mass at 12:00 within the left breast.   09/03/2014 -  Adjuvant Chemotherapy ddAC every 2 weeks X4, followed by weekly Taxol X12     HISTORY OF PRESENTING ILLNESS:  Mary Randall 59 y.o. female is here because of recently newly diagnosed left breast cancer. She presents to the clinic with her daughter.  This was discovered by screening mammogram. Her prior mammogram was 2 years ago. She noticed mild left nipple retraction, no palpable mass, no other changes. She feels well overall, denies any significant pain except mild right knee pain from arthritis. She denies any change of her appetite, or recent weight loss. She works for post [employer], Horticulturist, commercial, and is physically active.   Her family history is notable for breast cancer in her brother,  pancreatic cancer in another brother, and colon cancer in her mother. She is postmenopausal, last menstrual period 8 years ago. She has a daughter and a son. She lives with her brother, and her daughter is likely going to stay with her.  CURRENT TREATMENT: weekly  Paclitaxel 80 mg/m, started on 11/10/2014, plan for total of 12 weeks  [place] returns for follow up and second week paclitaxel. She tolerated her first week very well, without noticeable side effects. Her energy level has improved, but she has not done much activity , she usually sits or lays in bed during the day.  She is able to walk without significant shortness breast.  Her appetite has improved, she is eating better than before.  no other new complaints.  MEDICAL HISTORY:  Past Medical History  Diagnosis Date  . Hypertension   . Angina effort ([place])   . NSTEMI (non-ST elevated myocardial infarction) ([place]) 03/16/2014    non-obs CAD, spasm seen  . Pneumonia 2009  . Arthritis     "right knee" (03/16/2014)  . 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 Breast cancer ([place]) 2016  . Family history of breast cancer   . Family history of pancreatic cancer   . Family history of breast cancer in female     SURGICAL HISTORY: Past Surgical History  Procedure Laterality Date  . Other surgical history  1991    Tubal pregnancy  . Cardiac catheterization  2010; 03/16/2014  . Tubal ligation  1981  . Left heart catheterization with coronary angiogram N/A 03/16/2014  Procedure: LEFT HEART CATHETERIZATION WITH CORONARY ANGIOGRAM;  Surgeon: Sherren Mocha, MD; Non-obs CAD, EF 55%  . Bunionectomy Bilateral   . Breast biopsy Left 08/06/14  . Portacath placement N/A 08/28/2014    Procedure: INSERTION PORT-A-CATH;  Surgeon: Autumn Messing III, MD;  Location: [facility];  Service: General;  Laterality: N/A;   GYN HISTORY  Menarchal: 15 LMP: 51 Contraceptive: 3 HRT: no G2P2: no breast feeding     SOCIAL  HISTORY: Social History   Social History  . Marital Status: Legally Separated    Spouse Name: N/A  . Number of Children: Daughter 79, son 67   . Years of Education: N/A   Occupational History  . Works for post office    Social History Main Topics  . Smoking status: Never Smoker   . Smokeless tobacco: Never Used  . Alcohol Use: No  . Drug Use: No  . Sexual Activity: No   Other Topics Concern  . Not on file   Social History Narrative    FAMILY HISTORY: Family History  Problem Relation Age of Onset  . Pancreatic cancer Brother 28  . Cancer Brother 40    breast cancer   . Cancer Mother 11    colon cancer   . Hypertension Mother   . Diabetes type II Mother   . CVA Father   . Healthy Sister   . Healthy Sister     ALLERGIES:  is allergic to codeine; carvedilol; compazine; and percocet.  MEDICATIONS:  Current Outpatient Prescriptions  Medication Sig Dispense Refill  . amLODipine (NORVASC) 10 MG tablet Take 1 tablet (10 mg total) by mouth daily. 30 tablet 3  . aprepitant (EMEND) 80 MG capsule Take 1 capsule (80 mg total) by mouth daily. Start the day after chemotherapy. (Patient taking differently: Take 80 mg by mouth daily as needed (nausea/vomitting). start 2 days after and take for 3 days then stop until next round of chemo) 3 capsule 1  . atorvastatin (LIPITOR) 40 MG tablet Take 1 tablet (40 mg total) by mouth daily at 6 PM. 30 tablet 11  . hydrochlorothiazide (HYDRODIURIL) 25 MG tablet Take 25 mg by mouth daily.    . irbesartan (AVAPRO) 300 MG tablet Take 1 tablet (300 mg total) by mouth daily. 30 tablet 2  . isosorbide mononitrate (IMDUR) 30 MG 24 hr tablet Take 1 tablet (30 mg total) by mouth daily. 30 tablet 1  . Lidocaine-Prilocaine, Bulk, 2.5-2.5 % CREA Apply 5 mLs topically as needed. 1.5 hours before use with chemotherapy.Cover with plastic (Patient taking differently: Apply 5 mLs topically as needed (port access). 1.5 hours before use with chemotherapy.Cover  with plastic) 30 g 3  . LORazepam (ATIVAN) 0.5 MG tablet Take 1 tablet by mouth every 6 hours as needed for nausea or vomiting. 30 tablet 1  . ondansetron (ZOFRAN) 8 MG tablet Take 1 tablet (8 mg total) by mouth 2 (two) times daily as needed for nausea or vomiting. Start on 3rd day after Chemotherapy 30 tablet 1  . pegfilgrastim (NEULASTA) 6 MG/0.6ML injection Inject 6 mg into the skin as directed. One injection given 48 hours after chemo at [facility]    . potassium chloride (K-DUR) 10 MEQ tablet Take 1 tablet (10 mEq total) by mouth daily. 30 tablet 11  . PRESCRIPTION MEDICATION Chemo - [facility]    . promethazine (PHENERGAN) 12.5 MG tablet Take 1-2 tablets (12.5-25 mg total) by mouth every 6 (six) hours as needed for nausea or vomiting. 40 tablet 2  .  acyclovir (ZOVIRAX) 400 MG tablet Take 1 tablet (400 mg total) by mouth 3 (three) times daily. (Patient not taking: Reported on 11/03/2014) 30 tablet 1  . dexamethasone (DECADRON) 4 MG tablet Take 2 pills once the day after chemotherapy and then 2 times a day for 2 days. Take with food (Patient not taking: Reported on 11/03/2014) 30 tablet 1  . nitroGLYCERIN (NITROSTAT) 0.4 MG SL tablet Place 1 tablet (0.4 mg total) under the tongue every 5 (five) minutes as needed for chest pain. (Patient not taking: Reported on 11/03/2014) 25 tablet 12  . prochlorperazine (COMPAZINE) 10 MG tablet Take 1 tablet (10 mg total) by mouth every 6 (six) hours as needed for nausea or vomiting. (Patient not taking: Reported on 11/17/2014) 30 tablet 1   No current facility-administered medications for this visit.    REVIEW OF SYSTEMS:   Constitutional: Denies fevers, chills or abnormal night sweats Eyes: Denies blurriness of vision, double vision or watery eyes Ears, nose, mouth, throat, and face: Denies mucositis or sore throat Respiratory: Denies cough, dyspnea or wheezes Cardiovascular: Denies palpitation, chest discomfort or lower extremity swelling Gastrointestinal:  Denies  nausea, heartburn or change in bowel habits Skin: Denies abnormal skin rashes Lymphatics: Denies new lymphadenopathy or easy bruising Neurological:Denies numbness, tingling or new weaknesses Behavioral/Psych: Mood is stable, no new changes  All other systems were reviewed with the patient and are negative.  PHYSICAL EXAMINATION: ECOG PERFORMANCE STATUS: 2  Filed Vitals:   11/17/14 0939  BP: 111/72  Pulse: 104  Temp: 98.1 F (36.7 C)  Resp: 20   Filed Weights   11/17/14 0939  Weight: 183 lb 12.8 oz (83.371 kg)    GENERAL:alert, no distress and comfortable SKIN: skin color, texture, turgor are normal, no rashes or significant lesions, previous skin ulcers in the area above her vagina has healed  EYES: normal, conjunctiva are pink and non-injected, sclera clear OROPHARYNX:no exudate, no erythema and lips, buccal mucosa, and tongue normal  NECK: supple, thyroid normal size, non-tender, without nodularity LYMPH:  no palpable lymphadenopathy in the cervical, axillary or inguinal LUNGS: clear to auscultation and percussion with normal breathing effort HEART: regular rate & rhythm and no murmurs and no lower extremity edema ABDOMEN:abdomen soft, non-tender and normal bowel sounds Musculoskeletal:no cyanosis of digits and no clubbing  PSYCH: alert & oriented x 3 with fluent speech NEURO: no focal motor/sensory deficits Breasts: Breast inspection showed them to be symmetrical with no nipple discharge. Slight left nipple retraction. A 1.5cm (imnitially 2.5 cm) mass behind the nipple. Palpation of the right breasts and both axillas revealed no obvious mass that I could appreciate.   LABORATORY DATA:  I have reviewed the data as listed CBC Latest Ref Rng 11/17/2014 11/10/2014 11/03/2014  WBC 3.9 - 10.3 10e3/uL 3.6(L) 6.7 1.6(L)  Hemoglobin 11.6 - 15.9 g/dL 10.2(L) 10.2(L) 10.3(L)  Hematocrit 34.8 - 46.6 % 31.5(L) 31.3(L) 31.5(L)  Platelets 145 - 400 10e3/uL 335 205 167    CMP Latest  Ref Rng 11/17/2014 11/10/2014 11/03/2014  Glucose 70 - 140 mg/dl 112 126 106  BUN 7.0 - 26.0 mg/dL 13.0 6.6(L) 12.7  Creatinine 0.6 - 1.1 mg/dL 1.0 0.9 0.9  Sodium 136 - 145 mEq/L 141 144 142  Potassium 3.5 - 5.1 mEq/L 3.9 3.4(L) 4.1  Chloride 101 - 111 mmol/L - - -  CO2 22 - 29 mEq/L $Remove'25 26 26  'IKKsJiu$ Calcium 8.4 - 10.4 mg/dL 10.7(H) 10.5(H) 10.5(H)  Total Protein 6.4 - 8.3 g/dL 6.5 6.2(L) 6.2(L)  Total  Bilirubin 0.20 - 1.20 mg/dL 0.35 <0.30 0.74  Alkaline Phos 40 - 150 U/L 69 79 81  AST 5 - 34 U/L $Remo'15 11 10  'GioJD$ ALT 0 - 55 U/L $Remo'22 13 13   'fwiHU$ ANC 0.5 today    PATHOLOGY REPORT:  Diagnosis 08/06/2014 1. Breast, left, needle core biopsy, 9:30 o'clock, 1 CMFN - INVASIVE DUCTAL CARCINOMA, SEE COMMENT. 2. Lymph node, needle/core biopsy, left axillary lymph node - ONE LYMPH NODE, POSITIVE FOR METASTATIC MAMMARY CARCINOMA (1/1) 1. Although the grade of tumor is best assessed at resection, with these biopsies, the invasive tumor is grade 1. Breast prognostic studies are pending (part 1 only) and will be reported in an addendum. The case is reviewed with Dr. Avis Epley who concurs. (CRR:gt, 08/07/14)  Results: IMMUNOHISTOCHEMICAL AND MORPHOMETRIC ANALYSIS BY THE AUTOMATED CELLULAR IMAGING SYSTEM (ACIS) Estrogen Receptor: 100%, POSITIVE, STRONG STAINING INTENSITY (PERFORMED MANUALLY) Progesterone Receptor: 5%, POSITIVE, WEAK STAINING INTENSITY Proliferation Marker Ki67: 50% (PERFORMED MANUALLY)  Results: HER2 - NEGATIVE RATIO OF HER2/CEP17 SIGNALS 1.47 AVERAGE HER2 COPY NUMBER PER CELL 2.35  RADIOGRAPHIC STUDIES: I have personally reviewed the radiological images as listed and agreed with the findings in the report.  US Breast Ltd Uni Left Inc Axilla 08/06/2014    FINDINGS: CC and MLO views of the left breast with tomosynthesis and CC and ML magnification views were performed. There is an approximately 2 cm irregular mass within the slightly medial, subareolar left breast. In addition, there are associated  coarse heterogeneous calcifications. In total, the mass and calcifications span approximately 5.5 cm. A prominent lymph node is noted within the left axilla. Several adjacent oval, circumscribed low-density masses are noted within the left breast at 12 o'clock, middle depth.  Mammographic images were processed with CAD.  On physical exam, a palpable mass is noted within the left breast at 930, 1 cm from the nipple. No discrete mass is felt in the 12 o'clock region.  Targeted ultrasound is performed, showing an irregular hypoechoic mass at 930, 1 cm from the nipple measuring approximately 15 x 13 x 14 mm, corresponding to the palpable abnormality on exam. Dilated ducts extend from the mass to the level of the nipple, concerning for intraductal extension. The posterior extent of the calcifications seen mammographically is not well seen on ultrasound. At 12:30, 5 cm from the nipple, there is an oval, hypoechoic, circumscribed mass measuring approximately 8 x 8 x 2 mm which is thought to correspond to the largest of the oval low-density masses seen mammographically at 12 o'clock. No internal vascularity is identified.  Targeted exam of the left axilla demonstrates an abnormal appearing lymph node with cortical thickening measuring up to 5 mm.    IMPRESSION: 1. Highly suspicious left breast mass with associated pleomorphic calcifications which together span approximately 5.5 cm. Sonographic findings are concerning for intraductal extension of carcinoma to the level of the nipple. 2. Indeterminate left axillary lymph node with cortical thickening. 3. Circumscribed, low-density oval masses at 12 o'clock within the left breast.  RECOMMENDATION: 1. Ultrasound-guided core biopsy of the left breast mass at 930 and of the left axillary lymph node with cortical thickening. If biopsy of the left breast mass at 930 demonstrates malignancy, breast MRI is recommended for further evaluation of nipple involvement. 2. The patient's  outside prior mammograms will be obtained for comparison. If the low density oval masses seen within the left breast at 12 o'clock are unchanged in appearance, no further evaluation is recommended. An addendum will be made when  the comparison films are available.  I have discussed the findings and recommendations with the patient. Results were also provided in writing at the conclusion of the visit. If applicable, a reminder letter will be sent to the patient regarding the next appointment.  BI-RADS CATEGORY  5: Highly suggestive of malignancy.   Electronically Signed   By: Pamelia Hoit M.D.   On: 08/06/2014 12:16   Breast MRI 08/25/2014 Right breast: No mass or abnormal enhancement.  Left breast: There is an irregular mass immediately posterior to the left nipple. Mass demonstrates rapid persistent type enhancement kinetics and extends to the base of the nipple. The within the mass there is tissue marker clip artifact following recent ultrasound-guided core biopsy. Posterior to the mass there is clumped linear non mass enhancement extending into the lower inner quadrant, suspicious for ductal carcinoma in situ. This enhancement correlates well with the suspicious microcalcifications seen mammographically. Mass and non mass enhancement measured together are 2.5 x 2.5 x 6.6 (anterior to posterior) cm. No additional sites of concern are identified within the left breast.  Lymph nodes: Enlarged lower left axillary lymph node is identified, associated with tissue marker clip following recent ultrasound-guided core biopsy. Additional normal morphology and left axillary lymph nodes are demonstrated. Right axillary lymph nodes and internal mammary chain are unremarkable in appearance.  Ancillary findings: None.  IMPRESSION: 1. Mass and non mass enhancement in the retroareolar and lower inner quadrant of the left breast. Enhancement from the mass does appear to involve the base of the left  nipple. The extent of disease is felt to be well demonstrated mammographically. 2. Enlarged left axillary lymph node, corresponding to known metastatic disease as identified on recent core biopsy. 3. Right breast is negative.   Echo 08/24/2014 Study Conclusions  - Left ventricle: The cavity size was normal. Wall thickness was normal. Systolic function was normal. The estimated ejection fraction was in the range of 55% to 60%. - Left atrium: The atrium was mildly dilated.  Bone scan 08/31/2014 IMPRESSION: 1. Tiny focus of increased uptake on the right at approximately T8. No abnormalities demonstrated on the coronal or sagittal images through the thoracic spine on the previous CT scan. Mild bilateral increased uptake at approximately T4 that is likely degenerative. 2. No abnormal uptake observed elsewhere.  CT chest, abdomen or pelvis 08/31/2014 IMPRESSION: 2 cm left breast mass, consistent with known primary breast carcinoma. No evidence of metastatic disease within the chest, abdomen, or pelvis.  Small hiatal hernia incidentally noted.    ASSESSMENT & PLAN:  59 year old African-American female, postmenopausal, healthy and fit, who was found to have a left breast mass and a biopsy confirmed node metastasis by screening mammogram.  1. Left breast invasive ductal carcinoma, cT3N1M0, stage IIIA, ER+/PR+/HER2- -I reviewed her mammograms, breast ultrasound and MRI findings and core needle biopsy results. -I reviewed her bone scan and CT scan results with her today. She has a very small uptake at T8 on bone scan, but no collar related changes on the CT scan. She is not symptomatic, I think the likelihood of bone metastasis is very low. CT scan was negative for any distant metastasis. - I recommend neoadjuvant chemotherapy. Giving the large primary tumor, node positive disease, high Ki-67, she has very high risk of cancer recurrence after surgery. -given her ER+/HER2- disease, I  recommend dose dense Adriamycin and Cytoxan every 2 weeks for fourth cycle, followed by weekly Taxol for 12 weeks. -Giving her positive lymph nodes, she would likely need  adjuvant radiation, we'll refer her.  -Echocardiogram showed normal EF. - she is tolerating weekly Taxol better, we'll continue. Lab reviewed. - potential side effects from Taxol were reviewed with her again.  2. fatigue -secondary to chemo  - much improved since she completed AC.  - I strongly encouraging her to be more physically active,  She agrees to try  3. Cancer genetics She has very strong family history of breast, colon and pancreatic cancer, all in first-degree, I recommend her to see genetic counselor to ruled out inheritable breast cancer syndrome.  -her genetic testing result was normal  4. History of non-STEMI in 03/2014 -She had cardiac catheterization, which showed mild diffuse coronary artery disease, but no need intervention.  -She will follow-up with her cardiologist Dr. Burt Knack -We discussed some of the chemotherapy drug may cause heart failure, we'll repeat her echo after chemo, she may need follow-up with Dr. Burt Knack during her chemotherapy.   5. Herpes in perivaginal area  -resolved  -Continue prophylactic acyclovir 400 mg twice daily until she completes chemotherapy.   Plan - continue weekly Taxol. -I'll see her back in 2 weeks  All questions were answered. The patient knows to call the clinic with any problems, questions or concerns. I spent 20 minutes counseling the patient face to face. The total time spent in the appointment was 25 minutes and more than 50% was on counseling.     Truitt Merle, MD 11/17/2014 10:02 AM

## 2014-11-20 ENCOUNTER — Encounter: Payer: Self-pay | Admitting: Cardiovascular Disease

## 2014-11-20 ENCOUNTER — Ambulatory Visit (INDEPENDENT_AMBULATORY_CARE_PROVIDER_SITE_OTHER): Payer: Federal, State, Local not specified - PPO | Admitting: Cardiovascular Disease

## 2014-11-20 VITALS — BP 138/72 | HR 108 | Ht 67.0 in | Wt 182.0 lb

## 2014-11-20 DIAGNOSIS — I10 Essential (primary) hypertension: Secondary | ICD-10-CM | POA: Diagnosis not present

## 2014-11-20 NOTE — Progress Notes (Signed)
Cardiology Office Note Date:  11/22/2014   ID:  Mary Randall, DOB [birth year], MRN [id number]  PCP:  Suzanna Obey, MD  Cardiologist:  Sherren Mocha, MD    Chief Complaint  Patient presents with  . Appointment    currently not taking chemo meds but wants them to remain on list. exhausted today with no complaints.     History of Present Illness: Mary Randall is a 59 y.o. female who presents for follow-up of Prinzmetal Angina. The patient presented in March 2016 with acute coronary syndrome. She underwent cardiac catheterization March 14 demonstrating mild diffuse nonobstructive CAD. She was noted to have normal LV systolic function and normal LVEDP. EKG was noted to be markedly abnormal. Cardiac enzymes were negative. She was felt to have coronary vasospasm as the most likely etiology of her symptoms. She was treated with isosorbide and amlodipine. She's also been on aspirin and atorvastatin. The patient had been on these medications long-term, but she was having difficulty getting refills and had been off of the isosorbide leading up to her hospitalization.  The patient continues with chemotherapy for treatment of left-sided breast cancer. Plans noted for mastectomy and radiation therapy after she completes 12 weeks of Taxol. Plans also noted to repeat an echocardiogram after chemotherapy is completed.  From a symptomatic perspective, the patient complains of generalized weakness , fatigue, and shortness of breath with activity. She denies edema, orthopnea, or PND. She denies heart palpitations. She has occasional chest pain and has taken NTG once recently.    Past Medical History  Diagnosis Date  . Hypertension   . Angina effort ([place])   . NSTEMI (non-ST elevated myocardial infarction) ([facility]) 03/16/2014    non-obs CAD, spasm seen  . Pneumonia 2009  . Arthritis     "right knee" (03/16/2014)  . Coronary vasospasm ([place]) 2010; 03/16/2014    Prinz-Metal's angina  . Breast cancer ([facility]) 2016   . Family history of breast cancer   . Family history of pancreatic cancer   . Family history of breast cancer in female     Past Surgical History  Procedure Laterality Date  . Other surgical history  1991    Tubal pregnancy  . Cardiac catheterization  2010; 03/16/2014  . Tubal ligation  1981  . Left heart catheterization with coronary angiogram N/A 03/16/2014    Procedure: LEFT HEART CATHETERIZATION WITH CORONARY ANGIOGRAM;  Surgeon: Sherren Mocha, MD; Non-obs CAD, EF 55%  . Bunionectomy Bilateral   . Breast biopsy Left 08/06/14  . Portacath placement N/A 08/28/2014    Procedure: INSERTION PORT-A-CATH;  Surgeon: Autumn Messing III, MD;  Location: [facility];  Service: General;  Laterality: N/A;    Current Outpatient Prescriptions  Medication Sig Dispense Refill  . amLODipine (NORVASC) 10 MG tablet Take 1 tablet (10 mg total) by mouth daily. 30 tablet 3  . atorvastatin (LIPITOR) 40 MG tablet Take 1 tablet (40 mg total) by mouth daily at 6 PM. 30 tablet 11  . hydrochlorothiazide (HYDRODIURIL) 25 MG tablet Take 25 mg by mouth daily.    . irbesartan (AVAPRO) 300 MG tablet Take 1 tablet (300 mg total) by mouth daily. 30 tablet 2  . isosorbide mononitrate (IMDUR) 30 MG 24 hr tablet Take 1 tablet (30 mg total) by mouth daily. 30 tablet 1  . LORazepam (ATIVAN) 0.5 MG tablet Take 1 tablet by mouth every 6 hours as needed for nausea or vomiting. 30 tablet 1  . ondansetron (ZOFRAN) 8 MG tablet Take 1  tablet (8 mg total) by mouth 2 (two) times daily as needed for nausea or vomiting. Start on 3rd day after Chemotherapy 30 tablet 1  . pegfilgrastim (NEULASTA) 6 MG/0.6ML injection Inject 6 mg into the skin as directed. One injection given 48 hours after chemo at [facility]    . potassium chloride (K-DUR) 10 MEQ tablet Take 1 tablet (10 mEq total) by mouth daily. 30 tablet 11  . promethazine (PHENERGAN) 12.5 MG tablet Take 1-2 tablets (12.5-25 mg total) by mouth every 6 (six) hours as needed for nausea or vomiting. 40  tablet 2  . acyclovir (ZOVIRAX) 400 MG tablet Take 1 tablet (400 mg total) by mouth 3 (three) times daily. (Patient not taking: Reported on 11/03/2014) 30 tablet 1  . aprepitant (EMEND) 80 MG capsule Take 1 capsule (80 mg total) by mouth daily. Start the day after chemotherapy. (Patient not taking: Reported on 11/20/2014) 3 capsule 1  . dexamethasone (DECADRON) 4 MG tablet Take 2 pills once the day after chemotherapy and then 2 times a day for 2 days. Take with food (Patient not taking: Reported on 11/03/2014) 30 tablet 1  . Lidocaine-Prilocaine, Bulk, 2.5-2.5 % CREA Apply 5 mLs topically as needed. 1.5 hours before use with chemotherapy.Cover with plastic (Patient not taking: Reported on 11/20/2014) 30 g 3  . nitroGLYCERIN (NITROSTAT) 0.4 MG SL tablet Place 1 tablet (0.4 mg total) under the tongue every 5 (five) minutes as needed for chest pain. (Patient not taking: Reported on 11/03/2014) 25 tablet 12  . PRESCRIPTION MEDICATION Chemo - [facility]    . prochlorperazine (COMPAZINE) 10 MG tablet Take 1 tablet (10 mg total) by mouth every 6 (six) hours as needed for nausea or vomiting. (Patient not taking: Reported on 11/17/2014) 30 tablet 1   No current facility-administered medications for this visit.    Allergies:   Codeine; Carvedilol; Compazine; Percocet; and Adhesive   Social History:  The patient  reports that she has never smoked. She has never used smokeless tobacco. She reports that she does not drink alcohol or use illicit drugs.   Family History:  The patient's  family history includes CVA in her father; Cancer (age of onset: 57) in her brother; Cancer (age of onset: 60) in her mother; Diabetes type II in her mother; Healthy in her sister and sister; Hypertension in her mother; Pancreatic cancer (age of onset: 1) in her brother.   ROS:  Please see the history of present illness.  Otherwise, review of systems is positive for diarrhea, constipation, nausea/vomiting (chemotherapy-related).  All  other systems are reviewed and negative.   PHYSICAL EXAM: VS:  BP 138/72 mmHg  Pulse 108  Ht 5\' 7"  (1.702 m)  Wt 182 lb (82.555 kg)  BMI 28.50 kg/m2 , BMI Body mass index is 28.5 kg/(m^2). GEN: Well nourished, well developed, in no acute distress HEENT: normal Neck: no JVD, no masses. No carotid bruits Cardiac: tachycardic and regular without murmur or gallop    Respiratory:  clear to auscultation bilaterally, normal work of breathing GI: soft, nontender, nondistended, + BS MS: no deformity or atrophy Ext: no pretibial edema, pedal pulses 2+= bilaterally Skin: warm and dry, no rash Neuro:  Strength and sensation are intact Psych: euthymic mood, full affect  EKG:  EKG is ordered today. The ekg ordered today shows sinus tachycardia 111 bpm  Recent Labs: 03/16/2014: B Natriuretic Peptide 61.4 10/14/2014: Magnesium 1.9 11/17/2014: ALT 22; BUN 13.0; Creatinine 1.0; HGB 10.2*; Platelets 335; Potassium 3.9; Sodium 141  Lipid Panel     Component Value Date/Time   CHOL 144 03/17/2014 0402   TRIG 136 03/17/2014 0402   HDL 39* 03/17/2014 0402   CHOLHDL 3.7 03/17/2014 0402   VLDL 27 03/17/2014 0402   LDLCALC 78 03/17/2014 0402      Wt Readings from Last 3 Encounters:  11/20/14 182 lb (82.555 kg)  11/17/14 183 lb 12.8 oz (83.371 kg)  11/03/14 183 lb 4.8 oz (83.144 kg)     Cardiac Studies Reviewed: 2D Echo 08/24/2014: Study Conclusions - Left ventricle: The cavity size was normal. Wall thickness was normal. Systolic function was normal. The estimated ejection fraction was in the range of 55% to 60%. - Left atrium: The atrium was mildly dilated.  Cardiac Cath 03/16/2014: Coronary angiography: Coronary dominance: right  Left mainstem: The left main stem is widely patent. The vessel has minor irregularity and divides into the LAD and left circumflex. There is no stenosis.  Left anterior descending (LAD): The LAD has very minimal stenosis at the ostium. The diagonal  branches are patent. The first diagonal has minor irregularity. The second diagonal has 50% ostial stenosis. The proximal LAD has no significant disease. The mid LAD after the second diagonal has diffuse 40-50% stenosis. The LAD wraps around the inferoapex.  Left circumflex (LCx): The left circumflex is large in caliber. The vessel has no significant obstruction through the proximal and mid segments. There is minor 20% stenosis in the mid vessel. The obtuse marginal branches are patent with mild nonobstructive disease noted.  Right coronary artery (RCA): This is a large, dominant vessel. The PDA and PLA branches are patent. There is no stenosis throughout the RCA distribution.  Left ventriculography: Left ventricular systolic function is normal, LVEF is estimated at 60-65%, there is no significant mitral regurgitation   Contrast: 85 cc Omnipaque  Estimated Blood Loss: minimal  Final Conclusions:  1. 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 Normal LV systolic function with normal LVEDP  ASSESSMENT AND PLAN: 1.  CAD, native vessel, with Prinzmetal Angina: overall appears stable on current medical therapy. Will continue the same.   2. Sinus tachycardia: suspect related to chemotherapy/nutritional status: encouraged increased fluids as tolerated. Follow[facility] closely at [facility] with weekly labs.  Current medicines are reviewed with the patient today.  The patient does not have concerns regarding medicines.  Labs/ tests ordered today include:   Orders Placed This Encounter  Procedures  . EKG 12-Lead    Disposition:   FU 6 months  Signed, Sherren Mocha, MD  11/22/2014 10:34 PM    [facility] [place], [place], [place]  [address] Phone: [phone]; Fax: [phone]

## 2014-11-20 NOTE — Patient Instructions (Signed)

## 2014-11-24 ENCOUNTER — Other Ambulatory Visit (HOSPITAL_BASED_OUTPATIENT_CLINIC_OR_DEPARTMENT_OTHER): Payer: Federal, State, Local not specified - PPO

## 2014-11-24 ENCOUNTER — Ambulatory Visit (HOSPITAL_BASED_OUTPATIENT_CLINIC_OR_DEPARTMENT_OTHER): Payer: Federal, State, Local not specified - PPO

## 2014-11-24 VITALS — BP 127/84 | HR 102 | Temp 98.0°F | Resp 18

## 2014-11-24 DIAGNOSIS — C773 Secondary and unspecified malignant neoplasm of axilla and upper limb lymph nodes: Secondary | ICD-10-CM

## 2014-11-24 DIAGNOSIS — Z5111 Encounter for antineoplastic chemotherapy: Secondary | ICD-10-CM

## 2014-11-24 DIAGNOSIS — C50212 Malignant neoplasm of upper-inner quadrant of left female breast: Secondary | ICD-10-CM | POA: Diagnosis not present

## 2014-11-24 LAB — COMPREHENSIVE METABOLIC PANEL (CC13)
ALBUMIN: 3.8 g/dL (ref 3.5–5.0)
ALK PHOS: 70 U/L (ref 40–150)
ALT: 23 U/L (ref 0–55)
ANION GAP: 11 meq/L (ref 3–11)
AST: 17 U/L (ref 5–34)
BILIRUBIN TOTAL: 0.45 mg/dL (ref 0.20–1.20)
BUN: 7.6 mg/dL (ref 7.0–26.0)
CALCIUM: 10.7 mg/dL — AB (ref 8.4–10.4)
CO2: 24 meq/L (ref 22–29)
Chloride: 106 mEq/L (ref 98–109)
Creatinine: 1 mg/dL (ref 0.6–1.1)
EGFR: 76 mL/min/{1.73_m2} — AB (ref >90)
Glucose: 99 mg/dl (ref 70–140)
Potassium: 4.3 mEq/L (ref 3.5–5.1)
Sodium: 141 mEq/L (ref 136–145)
TOTAL PROTEIN: 6.5 g/dL (ref 6.4–8.3)

## 2014-11-24 LAB — CBC WITH DIFFERENTIAL/PLATELET
BASO%: 0.7 % (ref 0.0–2.0)
Basophils Absolute: 0 10*3/uL (ref 0.0–0.1)
EOS ABS: 0 10*3/uL (ref 0.0–0.5)
EOS%: 0.5 % (ref 0.0–7.0)
HEMATOCRIT: 31.4 % — AB (ref 34.8–46.6)
HGB: 10 g/dL — ABNORMAL LOW (ref 11.6–15.9)
LYMPH%: 22.3 % (ref 14.0–49.7)
MCH: 29.5 pg (ref 25.1–34.0)
MCHC: 32 g/dL (ref 31.5–36.0)
MCV: 92.4 fL (ref 79.5–101.0)
MONO#: 0.3 10*3/uL (ref 0.1–0.9)
MONO%: 10.9 % (ref 0.0–14.0)
NEUT%: 65.6 % (ref 38.4–76.8)
NEUTROS ABS: 2.1 10*3/uL (ref 1.5–6.5)
PLATELETS: 306 10*3/uL (ref 145–400)
RBC: 3.4 10*6/uL — AB (ref 3.70–5.45)
RDW: 20.4 % — ABNORMAL HIGH (ref 11.2–14.5)
WBC: 3.2 10*3/uL — AB (ref 3.9–10.3)
lymph#: 0.7 10*3/uL — ABNORMAL LOW (ref 0.9–3.3)

## 2014-11-24 MED ORDER — SODIUM CHLORIDE 0.9 % IV SOLN
Freq: Once | INTRAVENOUS | Status: AC
  Administered 2014-11-24: 14:00:00 via INTRAVENOUS

## 2014-11-24 MED ORDER — FAMOTIDINE IN NACL 20-0.9 MG/50ML-% IV SOLN
INTRAVENOUS | Status: AC
  Filled 2014-11-24: qty 50

## 2014-11-24 MED ORDER — DIPHENHYDRAMINE HCL 50 MG/ML IJ SOLN
25.0000 mg | Freq: Once | INTRAMUSCULAR | Status: AC
Start: 2014-11-24 — End: 2014-11-24
  Administered 2014-11-24: 25 mg via INTRAVENOUS

## 2014-11-24 MED ORDER — PACLITAXEL CHEMO INJECTION 300 MG/50ML
80.0000 mg/m2 | Freq: Once | INTRAVENOUS | Status: AC
  Administered 2014-11-24: 156 mg via INTRAVENOUS
  Filled 2014-11-24: qty 26

## 2014-11-24 MED ORDER — SODIUM CHLORIDE 0.9 % IV SOLN
Freq: Once | INTRAVENOUS | Status: AC
  Administered 2014-11-24: 14:00:00 via INTRAVENOUS
  Filled 2014-11-24: qty 4

## 2014-11-24 MED ORDER — SODIUM CHLORIDE 0.9 % IJ SOLN
10.0000 mL | INTRAMUSCULAR | Status: DC | PRN
  Administered 2014-11-24: 10 mL
  Filled 2014-11-24: qty 10

## 2014-11-24 MED ORDER — HEPARIN SOD (PORK) LOCK FLUSH 100 UNIT/ML IV SOLN
500.0000 [IU] | Freq: Once | INTRAVENOUS | Status: AC | PRN
  Administered 2014-11-24: 500 [IU]
  Filled 2014-11-24: qty 5

## 2014-11-24 MED ORDER — DIPHENHYDRAMINE HCL 50 MG/ML IJ SOLN
INTRAMUSCULAR | Status: AC
  Filled 2014-11-24: qty 1

## 2014-11-24 MED ORDER — FAMOTIDINE IN NACL 20-0.9 MG/50ML-% IV SOLN
20.0000 mg | Freq: Once | INTRAVENOUS | Status: AC
  Administered 2014-11-24: 20 mg via INTRAVENOUS

## 2014-11-24 NOTE — Patient Instructions (Signed)
[facility] Discharge Instructions for Patients Receiving Chemotherapy  Today you received the following chemotherapy agents: Taxol.   To help prevent nausea and vomiting after your treatment, we encourage you to take your nausea medication: Compazine. Take one every 6 hours as needed. You may also take Zofran every 8 hours if needed.   If you develop nausea and vomiting that is not controlled by your nausea medication, call the clinic.   BELOW ARE SYMPTOMS THAT SHOULD BE REPORTED IMMEDIATELY:  *FEVER GREATER THAN 100.5 F  *CHILLS WITH OR WITHOUT FEVER  NAUSEA AND VOMITING THAT IS NOT CONTROLLED WITH YOUR NAUSEA MEDICATION  *UNUSUAL SHORTNESS OF BREATH  *UNUSUAL BRUISING OR BLEEDING  TENDERNESS IN MOUTH AND THROAT WITH OR WITHOUT PRESENCE OF ULCERS  *URINARY PROBLEMS  *BOWEL PROBLEMS  UNUSUAL RASH Items with * indicate a potential emergency and should be followed up as soon as possible.  Feel free to call the clinic should you have any questions or concerns. The clinic phone number is [phone].  Please show the [facility] at check-in to the Emergency Department and triage nurse.

## 2014-11-25 ENCOUNTER — Encounter (HOSPITAL_COMMUNITY): Payer: Self-pay

## 2014-11-30 ENCOUNTER — Ambulatory Visit (HOSPITAL_BASED_OUTPATIENT_CLINIC_OR_DEPARTMENT_OTHER): Payer: Federal, State, Local not specified - PPO

## 2014-11-30 ENCOUNTER — Telehealth: Payer: Self-pay | Admitting: Hematology

## 2014-11-30 ENCOUNTER — Ambulatory Visit (HOSPITAL_BASED_OUTPATIENT_CLINIC_OR_DEPARTMENT_OTHER): Payer: Federal, State, Local not specified - PPO | Admitting: Nurse Practitioner

## 2014-11-30 ENCOUNTER — Other Ambulatory Visit: Payer: Self-pay | Admitting: *Deleted

## 2014-11-30 ENCOUNTER — Telehealth: Payer: Self-pay | Admitting: *Deleted

## 2014-11-30 VITALS — BP 113/76 | HR 105 | Temp 98.3°F | Resp 20 | Ht 67.0 in | Wt 183.8 lb

## 2014-11-30 DIAGNOSIS — N390 Urinary tract infection, site not specified: Secondary | ICD-10-CM

## 2014-11-30 DIAGNOSIS — C773 Secondary and unspecified malignant neoplasm of axilla and upper limb lymph nodes: Secondary | ICD-10-CM | POA: Diagnosis not present

## 2014-11-30 DIAGNOSIS — C50212 Malignant neoplasm of upper-inner quadrant of left female breast: Secondary | ICD-10-CM | POA: Diagnosis not present

## 2014-11-30 DIAGNOSIS — R319 Hematuria, unspecified: Secondary | ICD-10-CM

## 2014-11-30 LAB — COMPREHENSIVE METABOLIC PANEL (CC13)
ALT: 27 U/L (ref 0–55)
ANION GAP: 8 meq/L (ref 3–11)
AST: 20 U/L (ref 5–34)
Albumin: 3.7 g/dL (ref 3.5–5.0)
Alkaline Phosphatase: 66 U/L (ref 40–150)
BUN: 11.7 mg/dL (ref 7.0–26.0)
CHLORIDE: 108 meq/L (ref 98–109)
CO2: 25 meq/L (ref 22–29)
CREATININE: 0.9 mg/dL (ref 0.6–1.1)
Calcium: 10.7 mg/dL — ABNORMAL HIGH (ref 8.4–10.4)
EGFR: 79 mL/min/{1.73_m2} — ABNORMAL LOW (ref >90)
Glucose: 104 mg/dl (ref 70–140)
POTASSIUM: 4.2 meq/L (ref 3.5–5.1)
Sodium: 141 mEq/L (ref 136–145)
Total Bilirubin: 0.54 mg/dL (ref 0.20–1.20)
Total Protein: 6.8 g/dL (ref 6.4–8.3)

## 2014-11-30 LAB — URINALYSIS, MICROSCOPIC - CHCC
Bilirubin (Urine): NEGATIVE
GLUCOSE UR CHCC: NEGATIVE mg/dL
Ketones: NEGATIVE mg/dL
NITRITE: NEGATIVE
PROTEIN: 300 mg/dL
Specific Gravity, Urine: 1.02 (ref 1.003–1.035)
UROBILINOGEN UR: 1 mg/dL (ref 0.2–1)
pH: 6 (ref 4.6–8.0)

## 2014-11-30 LAB — CBC WITH DIFFERENTIAL/PLATELET
BASO%: 0.3 % (ref 0.0–2.0)
BASOS ABS: 0 10*3/uL (ref 0.0–0.1)
EOS%: 0.7 % (ref 0.0–7.0)
Eosinophils Absolute: 0 10*3/uL (ref 0.0–0.5)
HEMATOCRIT: 31.7 % — AB (ref 34.8–46.6)
HEMOGLOBIN: 10.3 g/dL — AB (ref 11.6–15.9)
LYMPH#: 0.7 10*3/uL — AB (ref 0.9–3.3)
LYMPH%: 23.7 % (ref 14.0–49.7)
MCH: 30.4 pg (ref 25.1–34.0)
MCHC: 32.5 g/dL (ref 31.5–36.0)
MCV: 93.5 fL (ref 79.5–101.0)
MONO#: 0.2 10*3/uL (ref 0.1–0.9)
MONO%: 6.4 % (ref 0.0–14.0)
NEUT%: 68.9 % (ref 38.4–76.8)
NEUTROS ABS: 2 10*3/uL (ref 1.5–6.5)
Platelets: 281 10*3/uL (ref 145–400)
RBC: 3.39 10*6/uL — ABNORMAL LOW (ref 3.70–5.45)
RDW: 18.1 % — AB (ref 11.2–14.5)
WBC: 3 10*3/uL — AB (ref 3.9–10.3)

## 2014-11-30 MED ORDER — CIPROFLOXACIN HCL 500 MG PO TABS
500.0000 mg | ORAL_TABLET | Freq: Two times a day (BID) | ORAL | Status: DC
Start: 2014-11-30 — End: 2014-12-15

## 2014-11-30 NOTE — Telephone Encounter (Signed)
Appointments made and patient aware per pof

## 2014-11-30 NOTE — Telephone Encounter (Signed)
Dr. Burr Medico notified.  Spoke with pt and instructed pt to come in today at 2 pm for lab and to see Jenny Reichmann, NP symptom management at 230 pm.  Pt voiced understanding.

## 2014-11-30 NOTE — Telephone Encounter (Signed)
Received call from pt stating that she has a UTI & reports symptoms of pain/pressure at end of urination.  She states that this started 3-4 days ago & has gotten a little worse.  She is for chemo tomorrow.  She can be reached at [phone].

## 2014-12-01 ENCOUNTER — Encounter: Payer: Self-pay | Admitting: *Deleted

## 2014-12-01 ENCOUNTER — Ambulatory Visit (HOSPITAL_BASED_OUTPATIENT_CLINIC_OR_DEPARTMENT_OTHER): Payer: Federal, State, Local not specified - PPO

## 2014-12-01 ENCOUNTER — Encounter: Payer: Federal, State, Local not specified - PPO | Admitting: Hematology

## 2014-12-01 ENCOUNTER — Other Ambulatory Visit: Payer: Federal, State, Local not specified - PPO

## 2014-12-01 DIAGNOSIS — C773 Secondary and unspecified malignant neoplasm of axilla and upper limb lymph nodes: Secondary | ICD-10-CM

## 2014-12-01 DIAGNOSIS — Z5111 Encounter for antineoplastic chemotherapy: Secondary | ICD-10-CM

## 2014-12-01 DIAGNOSIS — C50212 Malignant neoplasm of upper-inner quadrant of left female breast: Secondary | ICD-10-CM

## 2014-12-01 MED ORDER — SODIUM CHLORIDE 0.9 % IJ SOLN
10.0000 mL | INTRAMUSCULAR | Status: DC | PRN
  Administered 2014-12-01: 10 mL
  Filled 2014-12-01: qty 10

## 2014-12-01 MED ORDER — SODIUM CHLORIDE 0.9 % IV SOLN
Freq: Once | INTRAVENOUS | Status: AC
  Administered 2014-12-01: 12:00:00 via INTRAVENOUS

## 2014-12-01 MED ORDER — DIPHENHYDRAMINE HCL 50 MG/ML IJ SOLN
INTRAMUSCULAR | Status: AC
  Filled 2014-12-01: qty 1

## 2014-12-01 MED ORDER — DIPHENHYDRAMINE HCL 50 MG/ML IJ SOLN
25.0000 mg | Freq: Once | INTRAMUSCULAR | Status: AC
  Administered 2014-12-01: 25 mg via INTRAVENOUS

## 2014-12-01 MED ORDER — SODIUM CHLORIDE 0.9 % IV SOLN
Freq: Once | INTRAVENOUS | Status: AC
  Administered 2014-12-01: 12:00:00 via INTRAVENOUS
  Filled 2014-12-01: qty 4

## 2014-12-01 MED ORDER — FAMOTIDINE IN NACL 20-0.9 MG/50ML-% IV SOLN
20.0000 mg | Freq: Once | INTRAVENOUS | Status: AC
  Administered 2014-12-01: 20 mg via INTRAVENOUS

## 2014-12-01 MED ORDER — FAMOTIDINE IN NACL 20-0.9 MG/50ML-% IV SOLN
INTRAVENOUS | Status: AC
Start: 2014-12-01 — End: 2014-12-01
  Filled 2014-12-01: qty 50

## 2014-12-01 MED ORDER — HEPARIN SOD (PORK) LOCK FLUSH 100 UNIT/ML IV SOLN
500.0000 [IU] | Freq: Once | INTRAVENOUS | Status: AC | PRN
  Administered 2014-12-01: 500 [IU]
  Filled 2014-12-01: qty 5

## 2014-12-01 MED ORDER — DEXTROSE 5 % IV SOLN
80.0000 mg/m2 | Freq: Once | INTRAVENOUS | Status: AC
  Administered 2014-12-01: 156 mg via INTRAVENOUS
  Filled 2014-12-01: qty 26

## 2014-12-02 ENCOUNTER — Encounter: Payer: Self-pay | Admitting: Nurse Practitioner

## 2014-12-02 ENCOUNTER — Telehealth: Payer: Self-pay | Admitting: *Deleted

## 2014-12-02 DIAGNOSIS — N390 Urinary tract infection, site not specified: Secondary | ICD-10-CM | POA: Insufficient documentation

## 2014-12-02 LAB — URINE CULTURE

## 2014-12-02 NOTE — Progress Notes (Signed)
SYMPTOM MANAGEMENT CLINIC   HPI: Mary Randall 59 y.o. female diagnosed with breast cancer.  Currently undergoing Taxol chemotherapy regimen.   Patient complains of urinary tract infection symptoms which include dysuria, frequency, and some mild achiness.  She denies any recent fevers or chills.  Urinalysis obtained today reveal a large amount of blood, nitrite negative, moderate leukocyte esterase, RBCs 21-50, WBCs too numerous to count, and moderate bacteria.  Urine culture results are pending.  Patient will be prescribed Cipro antibiotics for treatment of UTI.  She was advised to call/return or go directly to the emergency department for any worsening symptoms whatsoever.  HPI  ROS  Past Medical History  Diagnosis Date  . Hypertension   . Angina effort ([facility])   . NSTEMI (non-ST elevated myocardial infarction) ([facility]) 03/16/2014    non-obs CAD, spasm seen  . Pneumonia 2009  . Arthritis     "right knee" (03/16/2014)  . 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 Breast cancer ([facility]) 2016  . Family history of breast cancer   . Family history of pancreatic cancer   . Family history of breast cancer in female     Past Surgical History  Procedure Laterality Date  . Other surgical history  1991    Tubal pregnancy  . Cardiac catheterization  2010; 03/16/2014  . Tubal ligation  1981  . Left heart catheterization with coronary angiogram N/A 03/16/2014    Procedure: LEFT HEART CATHETERIZATION WITH CORONARY ANGIOGRAM;  Surgeon: Tonny Bollman, MD; Non-obs CAD, EF 55%  . Bunionectomy Bilateral   . Breast biopsy Left 08/06/14  . Portacath placement N/A 08/28/2014    Procedure: INSERTION PORT-A-CATH;  Surgeon: Chevis Pretty III, MD;  Location: [facility];  Service: General;  Laterality: N/A;    has Chest pain; Essential hypertension, benign; NSTEMI (non-ST elevated myocardial infarction) ([facility]); Prinzmetal variant angina ([facility]); Angina pectoris with documented spasm ([facility]); BP  (high blood pressure); Arthritis of knee, degenerative; Breast cancer of upper-inner quadrant of left female breast ([facility]); Family history of breast cancer; Family history of pancreatic cancer; Family history of breast cancer in female; Genetic testing; Chemotherapy induced nausea and vomiting; Anemia due to antineoplastic chemotherapy; Malignant neoplasm of breast ([facility]); Coronary artery spasm ([facility]); and UTI (urinary tract infection) on her problem list.    is allergic to codeine; carvedilol; compazine; percocet; and adhesive.    Medication List       This list is accurate as of: 11/30/14 11:59 PM.  Always use your most recent med list.               acyclovir 400 MG tablet  Commonly known as:  ZOVIRAX  Take 1 tablet (400 mg total) by mouth 3 (three) times daily.     amLODipine 10 MG tablet  Commonly known as:  NORVASC  Take 1 tablet (10 mg total) by mouth daily.     aprepitant 80 MG capsule  Commonly known as:  EMEND  Take 1 capsule (80 mg total) by mouth daily. Start the day after chemotherapy.     atorvastatin 40 MG tablet  Commonly known as:  LIPITOR  Take 1 tablet (40 mg total) by mouth daily at 6 PM.     ciprofloxacin 500 MG tablet  Commonly known as:  CIPRO  Take 1 tablet (500 mg total) by mouth 2 (two) times daily.     dexamethasone 4 MG tablet  Commonly known as:  DECADRON  Take 2 pills once  the day after chemotherapy and then 2 times a day for 2 days. Take with food     hydrochlorothiazide 25 MG tablet  Commonly known as:  HYDRODIURIL  Take 25 mg by mouth daily.     irbesartan 300 MG tablet  Commonly known as:  AVAPRO  Take 1 tablet (300 mg total) by mouth daily.     isosorbide mononitrate 30 MG 24 hr tablet  Commonly known as:  IMDUR  Take 1 tablet (30 mg total) by mouth daily.     Lidocaine-Prilocaine (Bulk) 2.5-2.5 % Crea  Apply 5 mLs topically as needed. 1.5 hours before use with chemotherapy.Cover with plastic     LORazepam 0.5 MG tablet  Commonly  known as:  ATIVAN  Take 1 tablet by mouth every 6 hours as needed for nausea or vomiting.     nitroGLYCERIN 0.4 MG SL tablet  Commonly known as:  NITROSTAT  Place 1 tablet (0.4 mg total) under the tongue every 5 (five) minutes as needed for chest pain.     ondansetron 8 MG tablet  Commonly known as:  ZOFRAN  Take 1 tablet (8 mg total) by mouth 2 (two) times daily as needed for nausea or vomiting. Start on 3rd day after Chemotherapy     potassium chloride 10 MEQ tablet  Commonly known as:  K-DUR  Take 1 tablet (10 mEq total) by mouth daily.     promethazine 12.5 MG tablet  Commonly known as:  PHENERGAN  Take 1-2 tablets (12.5-25 mg total) by mouth every 6 (six) hours as needed for nausea or vomiting.         PHYSICAL EXAMINATION  Oncology Vitals 12/01/2014 11/30/2014  Height - 170 cm  Weight (No Data) 83.371 kg  Weight (lbs) (No Data) 183 lbs 13 oz  BMI (kg/m2) - 28.79 kg/m2  Temp - 98.3  Pulse - 105  Resp - 20  SpO2 - 99  BSA (m2) - 1.99 m2   BP Readings from Last 2 Encounters:  11/30/14 113/76  11/24/14 127/84    Physical Exam  LABORATORY DATA:. Appointment on 11/30/2014  Component Date Value Ref Range Status  . WBC 11/30/2014 3.0* 3.9 - 10.3 10e3/uL Final  . NEUT# 11/30/2014 2.0  1.5 - 6.5 10e3/uL Final  . HGB 11/30/2014 10.3* 11.6 - 15.9 g/dL Final  . HCT 11/30/2014 31.7* 34.8 - 46.6 % Final  . Platelets 11/30/2014 281  145 - 400 10e3/uL Final  . MCV 11/30/2014 93.5  79.5 - 101.0 fL Final  . MCH 11/30/2014 30.4  25.1 - 34.0 pg Final  . MCHC 11/30/2014 32.5  31.5 - 36.0 g/dL Final  . RBC 11/30/2014 3.39* 3.70 - 5.45 10e6/uL Final  . RDW 11/30/2014 18.1* 11.2 - 14.5 % Final  . lymph# 11/30/2014 0.7* 0.9 - 3.3 10e3/uL Final  . MONO# 11/30/2014 0.2  0.1 - 0.9 10e3/uL Final  . Eosinophils Absolute 11/30/2014 0.0  0.0 - 0.5 10e3/uL Final  . Basophils Absolute 11/30/2014 0.0  0.0 - 0.1 10e3/uL Final  . NEUT% 11/30/2014 68.9  38.4 - 76.8 % Final  . LYMPH%  11/30/2014 23.7  14.0 - 49.7 % Final  . MONO% 11/30/2014 6.4  0.0 - 14.0 % Final  . EOS% 11/30/2014 0.7  0.0 - 7.0 % Final  . BASO% 11/30/2014 0.3  0.0 - 2.0 % Final  . Urine Culture, Routine 11/30/2014 Culture, Urine   Final   Comment: Final - ===== COLONY COUNT: ===== >=100,000 COLONIES/ML ENTEROCOCCUS SPECIES  ------------------------------------------------------------------------  ENTEROCOCCUS SPECIES     AMPICILLIN                       MIC      Sensitive        <=2 ug/ml    LEVOFLOXACIN                     MIC      Sensitive          1 ug/ml    NITROFURANTOIN                   MIC      Sensitive       <=16 ug/ml    VANCOMYCIN                       MIC      Sensitive          1 ug/ml    TETRACYCLINE                     MIC      Sensitive        <=1 ug/ml END OF REPORT   . Glucose 11/30/2014 Negative  Negative mg/dL Final  . Bilirubin (Urine) 11/30/2014 Negative  Negative Final  . Ketones 11/30/2014 Negative  Negative mg/dL Final  . Specific Gravity, Urine 11/30/2014 1.020  1.003 - 1.035 Final  . Blood 11/30/2014 Large  Negative Final  . pH 11/30/2014 6.0  4.6 - 8.0 Final  . Protein 11/30/2014 300  Negative- <30 mg/dL Final  . Urobilinogen, UR 11/30/2014 1  0.2 - 1 mg/dL Final  . Nitrite 11/30/2014 Negative  Negative Final  . Leukocyte Esterase 11/30/2014 Moderate  Negative Final  . RBC / HPF 11/30/2014 21-50  0 - 2 Final  . WBC, UA 11/30/2014 TNTC  0 - 2 Final  . Bacteria, UA 11/30/2014 Moderate  Negative- Trace Final  . Epithelial Cells 11/30/2014 Moderate  Negative- Few Final  . Sodium 11/30/2014 141  136 - 145 mEq/L Final  . Potassium 11/30/2014 4.2  3.5 - 5.1 mEq/L Final  . Chloride 11/30/2014 108  98 - 109 mEq/L Final  . CO2 11/30/2014 25  22 - 29 mEq/L Final  . Glucose 11/30/2014 104  70 - 140 mg/dl Final   Glucose reference range is for nonfasting patients. Fasting glucose reference range is 70- 100.  Marland Kitchen BUN 11/30/2014 11.7  7.0 - 26.0 mg/dL Final  . Creatinine  11/30/2014 0.9  0.6 - 1.1 mg/dL Final  . Total Bilirubin 11/30/2014 0.54  0.20 - 1.20 mg/dL Final  . Alkaline Phosphatase 11/30/2014 66  40 - 150 U/L Final  . AST 11/30/2014 20  5 - 34 U/L Final  . ALT 11/30/2014 27  0 - 55 U/L Final  . Total Protein 11/30/2014 6.8  6.4 - 8.3 g/dL Final  . Albumin 11/30/2014 3.7  3.5 - 5.0 g/dL Final  . Calcium 11/30/2014 10.7* 8.4 - 10.4 mg/dL Final  . Anion Gap 11/30/2014 8  3 - 11 mEq/L Final  . EGFR 11/30/2014 79* >90 ml/min/1.73 m2 Final   eGFR is calculated using the CKD-EPI Creatinine Equation (2009)     RADIOGRAPHIC STUDIES: No results found.  ASSESSMENT/PLAN:    Breast cancer of upper-inner quadrant of left female breast [facility]) Patient received her last cycle of Taxol chemotherapy on 11/24/2014.  She reports the development of a probable urinary tract  infection within the past several days.  Blood counts obtained today reveal a WBC of 3.0, ANC 2.0, hemoglobin 10.3, and platelet count 281.  After reviewing all findings with Dr. Burr Medico today-patient will be cleared to proceed with chemotherapy tomorrow, 12/01/2014.  Patient is scheduled to return for labs and her next cycle of chemotherapy on 12/08/2014.   UTI (urinary tract infection) Patient complains of urinary tract infection symptoms which include dysuria, frequency, and some mild achiness.  She denies any recent fevers or chills.  Urinalysis obtained today reveal a large amount of blood, nitrite negative, moderate leukocyte esterase, RBCs 21-50, WBCs too numerous to count, and moderate bacteria.  Urine culture results are pending.  Patient will be prescribed Cipro antibiotics for treatment of UTI.  She was advised to call/return or go directly to the emergency department for any worsening symptoms whatsoever.  Patient stated understanding of all instructions; and was in agreement with this plan of care. The patient knows to call the clinic with any problems, questions or concerns.   This  was a shared visit with Dr. Burr Medico today.  Total time spent with patient was 25 minutes;  with greater than 75 percent of that time spent in face to face counseling regarding patient's symptoms,  and coordination of care and follow up.  Disclaimer:This dictation was prepared with Dragon/digital dictation along with [employer]. Any transcriptional errors that result from this process are unintentional.  Drue Second, NP 12/02/2014   I have seen the patient, examined her. I agree with the assessment and and plan and have edited the notes.   Will start her on cipro for UTI. She is afebrile, does not appear to be septic, lab reviewed, we'll proceed her weekly Taxol tomorrow.  Truitt Merle  12/02/2014

## 2014-12-02 NOTE — Assessment & Plan Note (Signed)
Patient complains of urinary tract infection symptoms which include dysuria, frequency, and some mild achiness.  She denies any recent fevers or chills.  Urinalysis obtained today reveal a large amount of blood, nitrite negative, moderate leukocyte esterase, RBCs 21-50, WBCs too numerous to count, and moderate bacteria.  Urine culture results are pending.  Patient will be prescribed Cipro antibiotics for treatment of UTI.  She was advised to call/return or go directly to the emergency department for any worsening symptoms whatsoever.

## 2014-12-02 NOTE — Assessment & Plan Note (Signed)
Patient received her last cycle of Taxol chemotherapy on 11/24/2014.  She reports the development of a probable urinary tract infection within the past several days.  Blood counts obtained today reveal a WBC of 3.0, ANC 2.0, hemoglobin 10.3, and platelet count 281.  After reviewing all findings with Dr. Burr Medico today-patient will be cleared to proceed with chemotherapy tomorrow, 12/01/2014.  Patient is scheduled to return for labs and her next cycle of chemotherapy on 12/08/2014.

## 2014-12-02 NOTE — Telephone Encounter (Signed)
TC to pt to check status since [facility] visit 11/28- Pt reports UTI symptoms have resolved. Pt denies any burning, urgency, fever or chills. Pt reports she is feeling very well today. Pt understands to call this office with any questions or concerns.

## 2014-12-08 ENCOUNTER — Other Ambulatory Visit (HOSPITAL_BASED_OUTPATIENT_CLINIC_OR_DEPARTMENT_OTHER): Payer: Federal, State, Local not specified - PPO

## 2014-12-08 ENCOUNTER — Ambulatory Visit (HOSPITAL_BASED_OUTPATIENT_CLINIC_OR_DEPARTMENT_OTHER): Payer: Federal, State, Local not specified - PPO

## 2014-12-08 VITALS — BP 98/66 | HR 107 | Temp 98.3°F

## 2014-12-08 DIAGNOSIS — C773 Secondary and unspecified malignant neoplasm of axilla and upper limb lymph nodes: Secondary | ICD-10-CM | POA: Diagnosis not present

## 2014-12-08 DIAGNOSIS — Z5111 Encounter for antineoplastic chemotherapy: Secondary | ICD-10-CM | POA: Diagnosis not present

## 2014-12-08 DIAGNOSIS — C50212 Malignant neoplasm of upper-inner quadrant of left female breast: Secondary | ICD-10-CM | POA: Diagnosis not present

## 2014-12-08 LAB — CBC WITH DIFFERENTIAL/PLATELET
BASO%: 0.5 % (ref 0.0–2.0)
Basophils Absolute: 0 10*3/uL (ref 0.0–0.1)
EOS%: 0.8 % (ref 0.0–7.0)
Eosinophils Absolute: 0 10*3/uL (ref 0.0–0.5)
HCT: 31 % — ABNORMAL LOW (ref 34.8–46.6)
HGB: 10.3 g/dL — ABNORMAL LOW (ref 11.6–15.9)
LYMPH%: 23.7 % (ref 14.0–49.7)
MCH: 31 pg (ref 25.1–34.0)
MCHC: 33.1 g/dL (ref 31.5–36.0)
MCV: 93.5 fL (ref 79.5–101.0)
MONO#: 0.2 10*3/uL (ref 0.1–0.9)
MONO%: 6.6 % (ref 0.0–14.0)
NEUT%: 68.4 % (ref 38.4–76.8)
NEUTROS ABS: 2.2 10*3/uL (ref 1.5–6.5)
Platelets: 239 10*3/uL (ref 145–400)
RBC: 3.31 10*6/uL — AB (ref 3.70–5.45)
RDW: 20.4 % — ABNORMAL HIGH (ref 11.2–14.5)
WBC: 3.2 10*3/uL — AB (ref 3.9–10.3)
lymph#: 0.8 10*3/uL — ABNORMAL LOW (ref 0.9–3.3)

## 2014-12-08 LAB — COMPREHENSIVE METABOLIC PANEL
ALT: 22 U/L (ref 0–55)
AST: 20 U/L (ref 5–34)
Albumin: 3.7 g/dL (ref 3.5–5.0)
Alkaline Phosphatase: 74 U/L (ref 40–150)
Anion Gap: 12 mEq/L — ABNORMAL HIGH (ref 3–11)
BILIRUBIN TOTAL: 0.65 mg/dL (ref 0.20–1.20)
BUN: 14.2 mg/dL (ref 7.0–26.0)
CO2: 25 meq/L (ref 22–29)
CREATININE: 1.1 mg/dL (ref 0.6–1.1)
Calcium: 10.6 mg/dL — ABNORMAL HIGH (ref 8.4–10.4)
Chloride: 105 mEq/L (ref 98–109)
EGFR: 65 mL/min/{1.73_m2} — AB (ref >90)
GLUCOSE: 113 mg/dL (ref 70–140)
Potassium: 4 mEq/L (ref 3.5–5.1)
SODIUM: 141 meq/L (ref 136–145)
TOTAL PROTEIN: 6.8 g/dL (ref 6.4–8.3)

## 2014-12-08 MED ORDER — DIPHENHYDRAMINE HCL 50 MG/ML IJ SOLN
25.0000 mg | Freq: Once | INTRAMUSCULAR | Status: AC
  Administered 2014-12-08: 25 mg via INTRAVENOUS

## 2014-12-08 MED ORDER — DIPHENHYDRAMINE HCL 50 MG/ML IJ SOLN
INTRAMUSCULAR | Status: AC
  Filled 2014-12-08: qty 1

## 2014-12-08 MED ORDER — SODIUM CHLORIDE 0.9 % IV SOLN
Freq: Once | INTRAVENOUS | Status: AC
  Administered 2014-12-08: 11:00:00 via INTRAVENOUS
  Filled 2014-12-08: qty 4

## 2014-12-08 MED ORDER — HEPARIN SOD (PORK) LOCK FLUSH 100 UNIT/ML IV SOLN
500.0000 [IU] | Freq: Once | INTRAVENOUS | Status: AC | PRN
  Administered 2014-12-08: 500 [IU]
  Filled 2014-12-08: qty 5

## 2014-12-08 MED ORDER — PACLITAXEL CHEMO INJECTION 300 MG/50ML
80.0000 mg/m2 | Freq: Once | INTRAVENOUS | Status: AC
  Administered 2014-12-08: 156 mg via INTRAVENOUS
  Filled 2014-12-08: qty 26

## 2014-12-08 MED ORDER — FAMOTIDINE IN NACL 20-0.9 MG/50ML-% IV SOLN
20.0000 mg | Freq: Once | INTRAVENOUS | Status: AC
  Administered 2014-12-08: 20 mg via INTRAVENOUS

## 2014-12-08 MED ORDER — SODIUM CHLORIDE 0.9 % IJ SOLN
10.0000 mL | INTRAMUSCULAR | Status: DC | PRN
  Administered 2014-12-08: 10 mL
  Filled 2014-12-08: qty 10

## 2014-12-08 MED ORDER — FAMOTIDINE IN NACL 20-0.9 MG/50ML-% IV SOLN
INTRAVENOUS | Status: AC
  Filled 2014-12-08: qty 50

## 2014-12-08 MED ORDER — SODIUM CHLORIDE 0.9 % IV SOLN
Freq: Once | INTRAVENOUS | Status: AC
  Administered 2014-12-08: 10:00:00 via INTRAVENOUS

## 2014-12-08 NOTE — Patient Instructions (Signed)
[facility] Discharge Instructions for Patients Receiving Chemotherapy  Today you received the following chemotherapy agents:  Taxol  To help prevent nausea and vomiting after your treatment, we encourage you to take your nausea medication as prescribed.   If you develop nausea and vomiting that is not controlled by your nausea medication, call the clinic.   BELOW ARE SYMPTOMS THAT SHOULD BE REPORTED IMMEDIATELY:  *FEVER GREATER THAN 100.5 F  *CHILLS WITH OR WITHOUT FEVER  NAUSEA AND VOMITING THAT IS NOT CONTROLLED WITH YOUR NAUSEA MEDICATION  *UNUSUAL SHORTNESS OF BREATH  *UNUSUAL BRUISING OR BLEEDING  TENDERNESS IN MOUTH AND THROAT WITH OR WITHOUT PRESENCE OF ULCERS  *URINARY PROBLEMS  *BOWEL PROBLEMS  UNUSUAL RASH Items with * indicate a potential emergency and should be followed up as soon as possible.  Feel free to call the clinic you have any questions or concerns. The clinic phone number is [phone].  Please show the CHEMO ALERT CARD at check-in to the Emergency Department and triage nurse.   

## 2014-12-15 ENCOUNTER — Other Ambulatory Visit (HOSPITAL_BASED_OUTPATIENT_CLINIC_OR_DEPARTMENT_OTHER): Payer: Federal, State, Local not specified - PPO

## 2014-12-15 ENCOUNTER — Ambulatory Visit (HOSPITAL_BASED_OUTPATIENT_CLINIC_OR_DEPARTMENT_OTHER): Payer: Federal, State, Local not specified - PPO | Admitting: Hematology

## 2014-12-15 ENCOUNTER — Encounter: Payer: Self-pay | Admitting: Hematology

## 2014-12-15 ENCOUNTER — Telehealth: Payer: Self-pay | Admitting: *Deleted

## 2014-12-15 ENCOUNTER — Telehealth: Payer: Self-pay | Admitting: Hematology

## 2014-12-15 ENCOUNTER — Ambulatory Visit (HOSPITAL_BASED_OUTPATIENT_CLINIC_OR_DEPARTMENT_OTHER): Payer: Federal, State, Local not specified - PPO

## 2014-12-15 VITALS — BP 117/67 | HR 96 | Temp 97.9°F | Resp 18 | Ht 67.0 in | Wt 183.8 lb

## 2014-12-15 DIAGNOSIS — C50212 Malignant neoplasm of upper-inner quadrant of left female breast: Secondary | ICD-10-CM | POA: Diagnosis not present

## 2014-12-15 DIAGNOSIS — D61810 Antineoplastic chemotherapy induced pancytopenia: Secondary | ICD-10-CM | POA: Diagnosis not present

## 2014-12-15 DIAGNOSIS — Z5111 Encounter for antineoplastic chemotherapy: Secondary | ICD-10-CM

## 2014-12-15 LAB — CBC WITH DIFFERENTIAL/PLATELET
BASO%: 0 % (ref 0.0–2.0)
BASOS ABS: 0 10*3/uL (ref 0.0–0.1)
EOS ABS: 0 10*3/uL (ref 0.0–0.5)
EOS%: 1.1 % (ref 0.0–7.0)
HCT: 31.3 % — ABNORMAL LOW (ref 34.8–46.6)
HGB: 10.3 g/dL — ABNORMAL LOW (ref 11.6–15.9)
LYMPH%: 36.2 % (ref 14.0–49.7)
MCH: 30.7 pg (ref 25.1–34.0)
MCHC: 32.9 g/dL (ref 31.5–36.0)
MCV: 93.4 fL (ref 79.5–101.0)
MONO#: 0.2 10*3/uL (ref 0.1–0.9)
MONO%: 7.4 % (ref 0.0–14.0)
NEUT#: 1.6 10*3/uL (ref 1.5–6.5)
NEUT%: 55.3 % (ref 38.4–76.8)
Platelets: 231 10*3/uL (ref 145–400)
RBC: 3.35 10*6/uL — AB (ref 3.70–5.45)
RDW: 17.5 % — ABNORMAL HIGH (ref 11.2–14.5)
WBC: 2.8 10*3/uL — ABNORMAL LOW (ref 3.9–10.3)
lymph#: 1 10*3/uL (ref 0.9–3.3)

## 2014-12-15 LAB — COMPREHENSIVE METABOLIC PANEL
ALBUMIN: 3.7 g/dL (ref 3.5–5.0)
ALK PHOS: 71 U/L (ref 40–150)
ALT: 29 U/L (ref 0–55)
ANION GAP: 10 meq/L (ref 3–11)
AST: 18 U/L (ref 5–34)
BUN: 12 mg/dL (ref 7.0–26.0)
CO2: 25 meq/L (ref 22–29)
Calcium: 10.4 mg/dL (ref 8.4–10.4)
Chloride: 106 mEq/L (ref 98–109)
Creatinine: 1 mg/dL (ref 0.6–1.1)
EGFR: 68 mL/min/{1.73_m2} — AB (ref >90)
GLUCOSE: 114 mg/dL (ref 70–140)
POTASSIUM: 3.9 meq/L (ref 3.5–5.1)
SODIUM: 140 meq/L (ref 136–145)
Total Bilirubin: 0.63 mg/dL (ref 0.20–1.20)
Total Protein: 6.6 g/dL (ref 6.4–8.3)

## 2014-12-15 MED ORDER — HEPARIN SOD (PORK) LOCK FLUSH 100 UNIT/ML IV SOLN
500.0000 [IU] | Freq: Once | INTRAVENOUS | Status: AC | PRN
  Administered 2014-12-15: 500 [IU]
  Filled 2014-12-15: qty 5

## 2014-12-15 MED ORDER — FAMOTIDINE IN NACL 20-0.9 MG/50ML-% IV SOLN
20.0000 mg | Freq: Once | INTRAVENOUS | Status: AC
  Administered 2014-12-15: 20 mg via INTRAVENOUS

## 2014-12-15 MED ORDER — SODIUM CHLORIDE 0.9 % IV SOLN
Freq: Once | INTRAVENOUS | Status: AC
  Administered 2014-12-15: 10:00:00 via INTRAVENOUS

## 2014-12-15 MED ORDER — SODIUM CHLORIDE 0.9 % IV SOLN
Freq: Once | INTRAVENOUS | Status: AC
  Administered 2014-12-15: 11:00:00 via INTRAVENOUS
  Filled 2014-12-15: qty 4

## 2014-12-15 MED ORDER — PACLITAXEL CHEMO INJECTION 300 MG/50ML
80.0000 mg/m2 | Freq: Once | INTRAVENOUS | Status: AC
  Administered 2014-12-15: 156 mg via INTRAVENOUS
  Filled 2014-12-15: qty 26

## 2014-12-15 MED ORDER — SODIUM CHLORIDE 0.9 % IJ SOLN
10.0000 mL | INTRAMUSCULAR | Status: DC | PRN
  Administered 2014-12-15: 10 mL
  Filled 2014-12-15: qty 10

## 2014-12-15 MED ORDER — DIPHENHYDRAMINE HCL 50 MG/ML IJ SOLN
INTRAMUSCULAR | Status: AC
  Filled 2014-12-15: qty 1

## 2014-12-15 MED ORDER — DIPHENHYDRAMINE HCL 50 MG/ML IJ SOLN
25.0000 mg | Freq: Once | INTRAMUSCULAR | Status: AC
  Administered 2014-12-15: 25 mg via INTRAVENOUS

## 2014-12-15 MED ORDER — FAMOTIDINE IN NACL 20-0.9 MG/50ML-% IV SOLN
INTRAVENOUS | Status: AC
  Filled 2014-12-15: qty 50

## 2014-12-15 NOTE — Telephone Encounter (Signed)
per pof to sch pt appt-sent MW email to sch trmt-adv pt to get updated copy b4 leaving

## 2014-12-15 NOTE — Telephone Encounter (Signed)
Per staff message and POF I have scheduled appts. Advised scheduler of appts. JMW  

## 2014-12-15 NOTE — Progress Notes (Signed)
[place]  [phone] Fax:([phone]) [phone]  Clinic follow up Note   Patient Care Team: Katherina Mires, MD as PCP - General (Family Medicine) Autumn Messing III, MD as Consulting Physician (General Surgery) Sherren Mocha, MD as Consulting Physician (Cardiology) 12/15/2014  CHIEF COMPLAINTS:  Follow up left breast cancer   Oncology History   Breast cancer of upper-inner quadrant of left female breast   Staging form: Breast, AJCC 7th Edition     Clinical: Stage IIIA (T3, N1, M0) - Unsigned       Breast cancer of upper-inner quadrant of left female breast ([place])   08/06/2014 Initial Diagnosis Breast cancer of upper-inner quadrant of left female breast   08/06/2014 Initial Biopsy Left breast 9:30 o'clock position showed invasive ductal carcinoma, and 1 left axillary lymph node biopsy showed positive for metastatic carcinoma.   08/06/2014 Receptors her2 ER 100% positive, PR 5% positive, Ki-67 50%   08/06/2014 Mammogram Diagnostic mammogram and ultrasound showed left breast mass with associated calcification measuring 5.5 cm, indeterminate 8 left axillary lymph node was cortical thickening, circumscribed low density oval 56m mass at 12:00 within the left breast.   09/03/2014 -  Adjuvant Chemotherapy ddAC every 2 weeks X4, followed by weekly Taxol X12     HISTORY OF PRESENTING ILLNESS:  Mary Grill59y.o. female is here because of recently newly diagnosed left breast cancer. She presents to the clinic with her daughter.  This was discovered by screening mammogram. Her prior mammogram was 2 years ago. She noticed mild left nipple retraction, no palpable mass, no other changes. She feels well overall, denies any significant pain except mild right knee pain from arthritis. She denies any change of her appetite, or recent weight loss. 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   Her family history is notable for breast cancer in her brother,  pancreatic cancer in another brother, and colon cancer in her mother. She is postmenopausal, last menstrual period 8 years ago. She has a daughter and a son. She lives with her brother, and her daughter is likely going to stay with her.  CURRENT TREATMENT: weekly  Paclitaxel 80 mg/m, started on 11/10/2014, plan for total of 12 weeks  IViningreturns for follow up and 6th week paclitaxel. She has been tolerating Taxol very well, much better than AC. She has a mild numbness tingling on few fingers, (+) pigmentation on the nail and palm, no other noticeable side effects. Her appetite has improved and she is eating better. No diarrhea, pain, or other side effects. She is doing well overall at home.  MEDICAL HISTORY:  Past Medical History  Diagnosis Date  . Hypertension   . Angina effort ([facility]   . NSTEMI (non-ST elevated myocardial infarction) ([facility] 03/16/2014    non-obs CAD, spasm seen  . Pneumonia 2009  . Arthritis     "right knee" (03/16/2014)  . 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 Breast cancer ([facility] 2016  . Family history of breast cancer   . Family history of pancreatic cancer   . Family history of breast cancer in female     SURGICAL HISTORY: Past Surgical History  Procedure Laterality Date  . Other surgical history  1991    Tubal pregnancy  . Cardiac catheterization  2010; 03/16/2014  . Tubal ligation  1981  . Left heart catheterization with coronary angiogram N/A 03/16/2014    Procedure: LEFT HEART CATHETERIZATION WITH  CORONARY ANGIOGRAM;  Surgeon: Sherren Mocha, MD; Non-obs CAD, EF 55%  . Bunionectomy Bilateral   . Breast biopsy Left 08/06/14  . Portacath placement N/A 08/28/2014    Procedure: INSERTION PORT-A-CATH;  Surgeon: Autumn Messing III, MD;  Location: [facility];  Service: General;  Laterality: N/A;   GYN HISTORY  Menarchal: 15 LMP: 59 Contraceptive: 4 HRT: no G2P2: no breast feeding     SOCIAL HISTORY: Social History    Social History  . Marital Status: Legally Separated    Spouse Name: N/A  . Number of Children: Daughter 64, son 29   . Years of Education: N/A   Occupational History  . Works for post office    Social History Main Topics  . Smoking status: Never Smoker   . Smokeless tobacco: Never Used  . Alcohol Use: No  . Drug Use: No  . Sexual Activity: No   Other Topics Concern  . Not on file   Social History Narrative    FAMILY HISTORY: Family History  Problem Relation Age of Onset  . Pancreatic cancer Brother 32  . Cancer Brother 40    breast cancer   . Cancer Mother 34    colon cancer   . Hypertension Mother   . Diabetes type II Mother   . CVA Father   . Healthy Sister   . Healthy Sister     ALLERGIES:  is allergic to codeine; carvedilol; compazine; percocet; and adhesive.  MEDICATIONS:  Current Outpatient Prescriptions  Medication Sig Dispense Refill  . acyclovir (ZOVIRAX) 400 MG tablet Take 1 tablet (400 mg total) by mouth 3 (three) times daily. 30 tablet 1  . amLODipine (NORVASC) 10 MG tablet Take 1 tablet (10 mg total) by mouth daily. 30 tablet 3  . aprepitant (EMEND) 80 MG capsule Take 1 capsule (80 mg total) by mouth daily. Start the day after chemotherapy. 3 capsule 1  . atorvastatin (LIPITOR) 40 MG tablet Take 1 tablet (40 mg total) by mouth daily at 6 PM. 30 tablet 11  . hydrochlorothiazide (HYDRODIURIL) 25 MG tablet Take 25 mg by mouth daily.    . irbesartan (AVAPRO) 300 MG tablet Take 1 tablet (300 mg total) by mouth daily. 30 tablet 2  . isosorbide mononitrate (IMDUR) 30 MG 24 hr tablet Take 1 tablet (30 mg total) by mouth daily. 30 tablet 1  . Lidocaine-Prilocaine, Bulk, 2.5-2.5 % CREA Apply 5 mLs topically as needed. 1.5 hours before use with chemotherapy.Cover with plastic 30 g 3  . LORazepam (ATIVAN) 0.5 MG tablet Take 1 tablet by mouth every 6 hours as needed for nausea or vomiting. 30 tablet 1  . potassium chloride (K-DUR) 10 MEQ tablet Take 1 tablet (10  mEq total) by mouth daily. 30 tablet 11  . promethazine (PHENERGAN) 12.5 MG tablet Take 1-2 tablets (12.5-25 mg total) by mouth every 6 (six) hours as needed for nausea or vomiting. 40 tablet 2  . dexamethasone (DECADRON) 4 MG tablet Take 2 pills once the day after chemotherapy and then 2 times a day for 2 days. Take with food (Patient not taking: Reported on 11/03/2014) 30 tablet 1  . nitroGLYCERIN (NITROSTAT) 0.4 MG SL tablet Place 1 tablet (0.4 mg total) under the tongue every 5 (five) minutes as needed for chest pain. (Patient not taking: Reported on 11/03/2014) 25 tablet 12  . ondansetron (ZOFRAN) 8 MG tablet Take 1 tablet (8 mg total) by mouth 2 (two) times daily as needed for nausea or vomiting. Start on 3rd  day after Chemotherapy (Patient not taking: Reported on 11/30/2014) 30 tablet 1   No current facility-administered medications for this visit.    REVIEW OF SYSTEMS:   Constitutional: Denies fevers, chills or abnormal night sweats Eyes: Denies blurriness of vision, double vision or watery eyes Ears, nose, mouth, throat, and face: Denies mucositis or sore throat Respiratory: Denies cough, dyspnea or wheezes Cardiovascular: Denies palpitation, chest discomfort or lower extremity swelling Gastrointestinal:  Denies nausea, heartburn or change in bowel habits Skin: Denies abnormal skin rashes Lymphatics: Denies new lymphadenopathy or easy bruising Neurological:Denies numbness, tingling or new weaknesses Behavioral/Psych: Mood is stable, no new changes  All other systems were reviewed with the patient and are negative.  PHYSICAL EXAMINATION: ECOG PERFORMANCE STATUS: 1-2  Filed Vitals:   12/15/14 0925  BP: 117/67  Pulse: 96  Temp: 97.9 F (36.6 C)  Resp: 18   Filed Weights   12/15/14 0925  Weight: 183 lb 12.8 oz (83.371 kg)    GENERAL:alert, no distress and comfortable SKIN: skin color, texture, turgor are normal, no rashes or significant lesions, previous skin ulcers in the  area above her vagina has healed  EYES: normal, conjunctiva are pink and non-injected, sclera clear OROPHARYNX:no exudate, no erythema and lips, buccal mucosa, and tongue normal  NECK: supple, thyroid normal size, non-tender, without nodularity LYMPH:  no palpable lymphadenopathy in the cervical, axillary or inguinal LUNGS: clear to auscultation and percussion with normal breathing effort HEART: regular rate & rhythm and no murmurs and no lower extremity edema ABDOMEN:abdomen soft, non-tender and normal bowel sounds Musculoskeletal:no cyanosis of digits and no clubbing  PSYCH: alert & oriented x 3 with fluent speech NEURO: no focal motor/sensory deficits Breasts: Breast inspection showed them to be symmetrical with no nipple discharge. Slight left nipple retraction. A 1.0cm (imnitially 2.5 cm) mass behind the nipple. Palpation of the right breasts and both axillas revealed no obvious mass that I could appreciate.   LABORATORY DATA:  I have reviewed the data as listed CBC Latest Ref Rng 12/15/2014 12/08/2014 11/30/2014  WBC 3.9 - 10.3 10e3/uL 2.8(L) 3.2(L) 3.0(L)  Hemoglobin 11.6 - 15.9 g/dL 10.3(L) 10.3(L) 10.3(L)  Hematocrit 34.8 - 46.6 % 31.3(L) 31.0(L) 31.7(L)  Platelets 145 - 400 10e3/uL 231 239 281    CMP Latest Ref Rng 12/15/2014 12/08/2014 11/30/2014  Glucose 70 - 140 mg/dl 114 113 104  BUN 7.0 - 26.0 mg/dL 12.0 14.2 11.7  Creatinine 0.6 - 1.1 mg/dL 1.0 1.1 0.9  Sodium 136 - 145 mEq/L 140 141 141  Potassium 3.5 - 5.1 mEq/L 3.9 4.0 4.2  Chloride 101 - 111 mmol/L - - -  CO2 22 - 29 mEq/L _0 Calcium 8.4 - 10.4 mg/dL 10.4 10.6(H) 10.7(H)  Total Protein 6.4 - 8.3 g/dL 6.6 6.8 6.8  Total Bilirubin 0.20 - 1.20 mg/dL 0.63 0.65 0.54  Alkaline Phos 40 - 150 U/L 71 74 66  AST 5 - 34 U/L _1 ALT 0 - 55 U/L _2 ANC 1.6 today    PATHOLOGY REPORT:  Diagnosis 08/06/2014 1. Breast, left, needle core biopsy, 9:30 o'clock, 1 CMFN - INVASIVE DUCTAL CARCINOMA, SEE  COMMENT. 2. Lymph node, needle/core biopsy, left axillary lymph node - ONE LYMPH NODE, POSITIVE FOR METASTATIC MAMMARY CARCINOMA (1/1) 1. Although the grade of tumor is best assessed at resection, with these biopsies, the invasive tumor is grade 1. Breast prognostic studies are pending (part 1 only) and will be reported in an addendum.  The case is reviewed with Dr. Avis Epley who concurs. (CRR:gt, 08/07/14)  Results: IMMUNOHISTOCHEMICAL AND MORPHOMETRIC ANALYSIS BY THE AUTOMATED CELLULAR IMAGING SYSTEM (ACIS) Estrogen Receptor: 100%, POSITIVE, STRONG STAINING INTENSITY (PERFORMED MANUALLY) Progesterone Receptor: 5%, POSITIVE, WEAK STAINING INTENSITY Proliferation Marker Ki67: 50% (PERFORMED MANUALLY)  Results: HER2 - NEGATIVE RATIO OF HER2/CEP17 SIGNALS 1.47 AVERAGE HER2 COPY NUMBER PER CELL 2.35  RADIOGRAPHIC STUDIES: I have personally reviewed the radiological images as listed and agreed with the findings in the report.  US Breast Ltd Uni Left Inc Axilla 08/06/2014    FINDINGS: CC and MLO views of the left breast with tomosynthesis and CC and ML magnification views were performed. There is an approximately 2 cm irregular mass within the slightly medial, subareolar left breast. In addition, there are associated coarse heterogeneous calcifications. In total, the mass and calcifications span approximately 5.5 cm. A prominent lymph node is noted within the left axilla. Several adjacent oval, circumscribed low-density masses are noted within the left breast at 12 o'clock, middle depth.  Mammographic images were processed with CAD.  On physical exam, a palpable mass is noted within the left breast at 930, 1 cm from the nipple. No discrete mass is felt in the 12 o'clock region.  Targeted ultrasound is performed, showing an irregular hypoechoic mass at 930, 1 cm from the nipple measuring approximately 15 x 13 x 14 mm, corresponding to the palpable abnormality on exam. Dilated ducts extend from the mass to  the level of the nipple, concerning for intraductal extension. The posterior extent of the calcifications seen mammographically is not well seen on ultrasound. At 12:30, 5 cm from the nipple, there is an oval, hypoechoic, circumscribed mass measuring approximately 8 x 8 x 2 mm which is thought to correspond to the largest of the oval low-density masses seen mammographically at 12 o'clock. No internal vascularity is identified.  Targeted exam of the left axilla demonstrates an abnormal appearing lymph node with cortical thickening measuring up to 5 mm.    IMPRESSION: 1. Highly suspicious left breast mass with associated pleomorphic calcifications which together span approximately 5.5 cm. Sonographic findings are concerning for intraductal extension of carcinoma to the level of the nipple. 2. Indeterminate left axillary lymph node with cortical thickening. 3. Circumscribed, low-density oval masses at 12 o'clock within the left breast.  RECOMMENDATION: 1. Ultrasound-guided core biopsy of the left breast mass at 930 and of the left axillary lymph node with cortical thickening. If biopsy of the left breast mass at 930 demonstrates malignancy, breast MRI is recommended for further evaluation of nipple involvement. 2. The patient's outside prior mammograms will be obtained for comparison. If the low density oval masses seen within the left breast at 12 o'clock are unchanged in appearance, no further evaluation is recommended. An addendum will be made when the comparison films are available.  I have discussed the findings and recommendations with the patient. Results were also provided in writing at the conclusion of the visit. If applicable, a reminder letter will be sent to the patient regarding the next appointment.  BI-RADS CATEGORY  5: Highly suggestive of malignancy.   Electronically Signed   By: Pamelia Hoit M.D.   On: 08/06/2014 12:16   Breast MRI 08/25/2014 Right breast: No mass or abnormal enhancement.  Left  breast: There is an irregular mass immediately posterior to the left nipple. Mass demonstrates rapid persistent type enhancement kinetics and extends to the base of the nipple. The within the mass there is tissue marker clip artifact  following recent ultrasound-guided core biopsy. Posterior to the mass there is clumped linear non mass enhancement extending into the lower inner quadrant, suspicious for ductal carcinoma in situ. This enhancement correlates well with the suspicious microcalcifications seen mammographically. Mass and non mass enhancement measured together are 2.5 x 2.5 x 6.6 (anterior to posterior) cm. No additional sites of concern are identified within the left breast.  Lymph nodes: Enlarged lower left axillary lymph node is identified, associated with tissue marker clip following recent ultrasound-guided core biopsy. Additional normal morphology and left axillary lymph nodes are demonstrated. Right axillary lymph nodes and internal mammary chain are unremarkable in appearance.  Ancillary findings: None.  IMPRESSION: 1. Mass and non mass enhancement in the retroareolar and lower inner quadrant of the left breast. Enhancement from the mass does appear to involve the base of the left nipple. The extent of disease is felt to be well demonstrated mammographically. 2. Enlarged left axillary lymph node, corresponding to known metastatic disease as identified on recent core biopsy. 3. Right breast is negative.   Echo 08/24/2014 Study Conclusions  - Left ventricle: The cavity size was normal. Wall thickness was normal. Systolic function was normal. The estimated ejection fraction was in the range of 55% to 60%. - Left atrium: The atrium was mildly dilated.  Bone scan 08/31/2014 IMPRESSION: 1. Tiny focus of increased uptake on the right at approximately T8. No abnormalities demonstrated on the coronal or sagittal images through the thoracic spine on the previous  CT scan. Mild bilateral increased uptake at approximately T4 that is likely degenerative. 2. No abnormal uptake observed elsewhere.  CT chest, abdomen or pelvis 08/31/2014 IMPRESSION: 2 cm left breast mass, consistent with known primary breast carcinoma. No evidence of metastatic disease within the chest, abdomen, or pelvis.  Small hiatal hernia incidentally noted.    ASSESSMENT & PLAN:  59 year old African-American female, postmenopausal, healthy and fit, who was found to have a left breast mass and a biopsy confirmed node metastasis by screening mammogram.  1. Left breast invasive ductal carcinoma, cT3N1M0, stage IIIA, ER+/PR+/HER2- -I reviewed her mammograms, breast ultrasound and MRI findings and core needle biopsy results. -I reviewed her bone scan and CT scan results with her today. She has a very small uptake at T8 on bone scan, but no collar related changes on the CT scan. She is not symptomatic, I think the likelihood of bone metastasis is very low. CT scan was negative for any distant metastasis. - I recommend neoadjuvant chemotherapy. Giving the large primary tumor, node positive disease, high Ki-67, she has very high risk of cancer recurrence after surgery. -given her ER+/HER2- disease, I recommend dose dense Adriamycin and Cytoxan every 2 weeks for fourth cycle, followed by weekly Taxol for 12 weeks. -Giving her positive lymph nodes, she would likely need adjuvant radiation.  -Echocardiogram showed normal EF. - she is tolerating weekly Taxol very well, we'll continue. Lab reviewed, adequate for treatment.   2. fatigue -secondary to chemo  - much improved since she completed AC.  - I strongly encouraging her to be more physically active, she is doing much better.  3. Cancer genetics She has very strong family history of breast, colon and pancreatic cancer, all in first-degree, I recommend her to see genetic counselor to ruled out inheritable breast cancer syndrome.    -her genetic testing result was normal  4. History of non-STEMI in 03/2014 -She had cardiac catheterization, which showed mild diffuse coronary artery disease, but no need intervention.  -She will  follow-up with her cardiologist Dr. Burt Knack -We discussed some of the chemotherapy drug may cause heart failure, we'll repeat her echo after chemo, she may need follow-up with Dr. Burt Knack during her chemotherapy.   5. UTI  -She was treated for UTI last week, nearly resolved.  6. Pancytopenia secondary to chemotherapy -We'll monitor her CBC weekly. No need blood transfusion for now.  Plan - continue weekly Taxol. -I'll see her back in 2 weeks  All questions were answered. The patient knows to call the clinic with any problems, questions or concerns. I spent 20 minutes counseling the patient face to face. The total time spent in the appointment was 25 minutes and more than 50% was on counseling.     Truitt Merle, MD 12/15/2014 9:46 AM

## 2014-12-15 NOTE — Patient Instructions (Signed)
[facility] Discharge Instructions for Patients Receiving Chemotherapy  Today you received the following chemotherapy agents:  Taxol  To help prevent nausea and vomiting after your treatment, we encourage you to take your nausea medication as prescribed.   If you develop nausea and vomiting that is not controlled by your nausea medication, call the clinic.   BELOW ARE SYMPTOMS THAT SHOULD BE REPORTED IMMEDIATELY:  *FEVER GREATER THAN 100.5 F  *CHILLS WITH OR WITHOUT FEVER  NAUSEA AND VOMITING THAT IS NOT CONTROLLED WITH YOUR NAUSEA MEDICATION  *UNUSUAL SHORTNESS OF BREATH  *UNUSUAL BRUISING OR BLEEDING  TENDERNESS IN MOUTH AND THROAT WITH OR WITHOUT PRESENCE OF ULCERS  *URINARY PROBLEMS  *BOWEL PROBLEMS  UNUSUAL RASH Items with * indicate a potential emergency and should be followed up as soon as possible.  Feel free to call the clinic you have any questions or concerns. The clinic phone number is [phone].  Please show the CHEMO ALERT CARD at check-in to the Emergency Department and triage nurse.   

## 2014-12-21 ENCOUNTER — Telehealth: Payer: Self-pay | Admitting: *Deleted

## 2014-12-21 NOTE — Telephone Encounter (Signed)
Mary Randall called asking this nurse to notify Dr. Burr Medico "I passed out on Saturday (12-19-2014).  I remember being dizzy when I got up that night to go to the bathroom.  This has never happened to me before but my family says they kept calling my name and I didn't respond.  All I recall is hearing them call my name and picking me up off the floor.  I did not go to the ED, they kept an eye on me."   Reports he is eating and drinking well.  Denies chest pain with this event and did not check her B/P when this happened. Takes HCTZ and anti-hypertensive.  Will notify Dr. Burr Medico but suggested she notify cardiologist and in these type of events to always call 911.

## 2014-12-22 ENCOUNTER — Encounter: Payer: Self-pay | Admitting: *Deleted

## 2014-12-22 ENCOUNTER — Ambulatory Visit (HOSPITAL_BASED_OUTPATIENT_CLINIC_OR_DEPARTMENT_OTHER): Payer: Federal, State, Local not specified - PPO

## 2014-12-22 ENCOUNTER — Other Ambulatory Visit: Payer: Federal, State, Local not specified - PPO

## 2014-12-22 ENCOUNTER — Other Ambulatory Visit (HOSPITAL_BASED_OUTPATIENT_CLINIC_OR_DEPARTMENT_OTHER): Payer: Federal, State, Local not specified - PPO

## 2014-12-22 VITALS — BP 104/73 | HR 107 | Temp 97.9°F | Resp 18

## 2014-12-22 DIAGNOSIS — Z5111 Encounter for antineoplastic chemotherapy: Secondary | ICD-10-CM | POA: Diagnosis not present

## 2014-12-22 DIAGNOSIS — C50212 Malignant neoplasm of upper-inner quadrant of left female breast: Secondary | ICD-10-CM

## 2014-12-22 LAB — CBC WITH DIFFERENTIAL/PLATELET
BASO%: 0.5 % (ref 0.0–2.0)
Basophils Absolute: 0 10*3/uL (ref 0.0–0.1)
EOS%: 0.6 % (ref 0.0–7.0)
Eosinophils Absolute: 0 10*3/uL (ref 0.0–0.5)
HEMATOCRIT: 33.2 % — AB (ref 34.8–46.6)
HGB: 10.9 g/dL — ABNORMAL LOW (ref 11.6–15.9)
LYMPH#: 1 10*3/uL (ref 0.9–3.3)
LYMPH%: 33.6 % (ref 14.0–49.7)
MCH: 31.1 pg (ref 25.1–34.0)
MCHC: 32.8 g/dL (ref 31.5–36.0)
MCV: 94.6 fL (ref 79.5–101.0)
MONO#: 0.2 10*3/uL (ref 0.1–0.9)
MONO%: 7.1 % (ref 0.0–14.0)
NEUT#: 1.7 10*3/uL (ref 1.5–6.5)
NEUT%: 58.2 % (ref 38.4–76.8)
PLATELETS: 248 10*3/uL (ref 145–400)
RBC: 3.51 10*6/uL — ABNORMAL LOW (ref 3.70–5.45)
RDW: 19.3 % — ABNORMAL HIGH (ref 11.2–14.5)
WBC: 3 10*3/uL — ABNORMAL LOW (ref 3.9–10.3)

## 2014-12-22 LAB — COMPREHENSIVE METABOLIC PANEL
ALBUMIN: 3.9 g/dL (ref 3.5–5.0)
ALK PHOS: 66 U/L (ref 40–150)
ALT: 24 U/L (ref 0–55)
ANION GAP: 11 meq/L (ref 3–11)
AST: 17 U/L (ref 5–34)
BILIRUBIN TOTAL: 0.73 mg/dL (ref 0.20–1.20)
BUN: 11.1 mg/dL (ref 7.0–26.0)
CALCIUM: 10.6 mg/dL — AB (ref 8.4–10.4)
CHLORIDE: 106 meq/L (ref 98–109)
CO2: 23 mEq/L (ref 22–29)
CREATININE: 1 mg/dL (ref 0.6–1.1)
EGFR: 71 mL/min/{1.73_m2} — ABNORMAL LOW (ref >90)
Glucose: 110 mg/dl (ref 70–140)
Potassium: 3.5 mEq/L (ref 3.5–5.1)
Sodium: 140 mEq/L (ref 136–145)
TOTAL PROTEIN: 6.8 g/dL (ref 6.4–8.3)

## 2014-12-22 MED ORDER — SODIUM CHLORIDE 0.9 % IJ SOLN
10.0000 mL | INTRAMUSCULAR | Status: DC | PRN
  Administered 2014-12-22: 10 mL
  Filled 2014-12-22: qty 10

## 2014-12-22 MED ORDER — SODIUM CHLORIDE 0.9 % IV SOLN
Freq: Once | INTRAVENOUS | Status: AC
  Administered 2014-12-22: 10:00:00 via INTRAVENOUS
  Filled 2014-12-22: qty 4

## 2014-12-22 MED ORDER — DIPHENHYDRAMINE HCL 50 MG/ML IJ SOLN
25.0000 mg | Freq: Once | INTRAMUSCULAR | Status: AC
  Administered 2014-12-22: 25 mg via INTRAVENOUS

## 2014-12-22 MED ORDER — HEPARIN SOD (PORK) LOCK FLUSH 100 UNIT/ML IV SOLN
500.0000 [IU] | Freq: Once | INTRAVENOUS | Status: AC | PRN
  Administered 2014-12-22: 500 [IU]
  Filled 2014-12-22: qty 5

## 2014-12-22 MED ORDER — FAMOTIDINE IN NACL 20-0.9 MG/50ML-% IV SOLN
20.0000 mg | Freq: Once | INTRAVENOUS | Status: AC
  Administered 2014-12-22: 20 mg via INTRAVENOUS

## 2014-12-22 MED ORDER — DIPHENHYDRAMINE HCL 50 MG/ML IJ SOLN
INTRAMUSCULAR | Status: AC
  Filled 2014-12-22: qty 1

## 2014-12-22 MED ORDER — FAMOTIDINE IN NACL 20-0.9 MG/50ML-% IV SOLN
INTRAVENOUS | Status: AC
  Filled 2014-12-22: qty 50

## 2014-12-22 MED ORDER — PACLITAXEL CHEMO INJECTION 300 MG/50ML
80.0000 mg/m2 | Freq: Once | INTRAVENOUS | Status: AC
  Administered 2014-12-22: 156 mg via INTRAVENOUS
  Filled 2014-12-22: qty 26

## 2014-12-22 MED ORDER — SODIUM CHLORIDE 0.9 % IV SOLN
Freq: Once | INTRAVENOUS | Status: AC
  Administered 2014-12-22: 10:00:00 via INTRAVENOUS

## 2014-12-22 NOTE — Patient Instructions (Signed)
[facility] Discharge Instructions for Patients Receiving Chemotherapy  Today you received the following chemotherapy agents:  Taxol  To help prevent nausea and vomiting after your treatment, we encourage you to take your nausea medication as prescribed.   If you develop nausea and vomiting that is not controlled by your nausea medication, call the clinic.   BELOW ARE SYMPTOMS THAT SHOULD BE REPORTED IMMEDIATELY:  *FEVER GREATER THAN 100.5 F  *CHILLS WITH OR WITHOUT FEVER  NAUSEA AND VOMITING THAT IS NOT CONTROLLED WITH YOUR NAUSEA MEDICATION  *UNUSUAL SHORTNESS OF BREATH  *UNUSUAL BRUISING OR BLEEDING  TENDERNESS IN MOUTH AND THROAT WITH OR WITHOUT PRESENCE OF ULCERS  *URINARY PROBLEMS  *BOWEL PROBLEMS  UNUSUAL RASH Items with * indicate a potential emergency and should be followed up as soon as possible.  Feel free to call the clinic you have any questions or concerns. The clinic phone number is [phone].  Please show the CHEMO ALERT CARD at check-in to the Emergency Department and triage nurse.   

## 2014-12-29 ENCOUNTER — Ambulatory Visit (HOSPITAL_BASED_OUTPATIENT_CLINIC_OR_DEPARTMENT_OTHER): Payer: Federal, State, Local not specified - PPO

## 2014-12-29 ENCOUNTER — Ambulatory Visit (HOSPITAL_BASED_OUTPATIENT_CLINIC_OR_DEPARTMENT_OTHER): Payer: Federal, State, Local not specified - PPO | Admitting: Physician Assistant

## 2014-12-29 ENCOUNTER — Other Ambulatory Visit (HOSPITAL_BASED_OUTPATIENT_CLINIC_OR_DEPARTMENT_OTHER): Payer: Federal, State, Local not specified - PPO

## 2014-12-29 ENCOUNTER — Encounter: Payer: Self-pay | Admitting: Physician Assistant

## 2014-12-29 VITALS — BP 126/79 | HR 105 | Temp 98.2°F | Resp 18 | Ht 67.0 in | Wt 179.4 lb

## 2014-12-29 DIAGNOSIS — D61810 Antineoplastic chemotherapy induced pancytopenia: Secondary | ICD-10-CM | POA: Diagnosis not present

## 2014-12-29 DIAGNOSIS — R530 Neoplastic (malignant) related fatigue: Secondary | ICD-10-CM | POA: Diagnosis not present

## 2014-12-29 DIAGNOSIS — D6481 Anemia due to antineoplastic chemotherapy: Secondary | ICD-10-CM

## 2014-12-29 DIAGNOSIS — C50212 Malignant neoplasm of upper-inner quadrant of left female breast: Secondary | ICD-10-CM

## 2014-12-29 DIAGNOSIS — Z800 Family history of malignant neoplasm of digestive organs: Secondary | ICD-10-CM

## 2014-12-29 DIAGNOSIS — T451X5A Adverse effect of antineoplastic and immunosuppressive drugs, initial encounter: Secondary | ICD-10-CM

## 2014-12-29 DIAGNOSIS — C773 Secondary and unspecified malignant neoplasm of axilla and upper limb lymph nodes: Secondary | ICD-10-CM | POA: Diagnosis not present

## 2014-12-29 DIAGNOSIS — Z1379 Encounter for other screening for genetic and chromosomal anomalies: Secondary | ICD-10-CM

## 2014-12-29 DIAGNOSIS — R112 Nausea with vomiting, unspecified: Secondary | ICD-10-CM

## 2014-12-29 DIAGNOSIS — Z5111 Encounter for antineoplastic chemotherapy: Secondary | ICD-10-CM | POA: Diagnosis not present

## 2014-12-29 DIAGNOSIS — Z803 Family history of malignant neoplasm of breast: Secondary | ICD-10-CM

## 2014-12-29 LAB — COMPREHENSIVE METABOLIC PANEL
ALK PHOS: 68 U/L (ref 40–150)
ALT: 29 U/L (ref 0–55)
ANION GAP: 14 meq/L — AB (ref 3–11)
AST: 18 U/L (ref 5–34)
Albumin: 3.8 g/dL (ref 3.5–5.0)
BILIRUBIN TOTAL: 0.68 mg/dL (ref 0.20–1.20)
BUN: 12.3 mg/dL (ref 7.0–26.0)
CALCIUM: 10.5 mg/dL — AB (ref 8.4–10.4)
CO2: 22 meq/L (ref 22–29)
CREATININE: 1.2 mg/dL — AB (ref 0.6–1.1)
Chloride: 103 mEq/L (ref 98–109)
EGFR: 58 mL/min/{1.73_m2} — AB (ref >90)
Glucose: 131 mg/dl (ref 70–140)
Potassium: 3.7 mEq/L (ref 3.5–5.1)
Sodium: 139 mEq/L (ref 136–145)
TOTAL PROTEIN: 6.8 g/dL (ref 6.4–8.3)

## 2014-12-29 LAB — CBC WITH DIFFERENTIAL/PLATELET
BASO%: 0 % (ref 0.0–2.0)
Basophils Absolute: 0 10*3/uL (ref 0.0–0.1)
EOS ABS: 0 10*3/uL (ref 0.0–0.5)
EOS%: 0.4 % (ref 0.0–7.0)
HCT: 30.9 % — ABNORMAL LOW (ref 34.8–46.6)
HGB: 10.2 g/dL — ABNORMAL LOW (ref 11.6–15.9)
LYMPH%: 38.2 % (ref 14.0–49.7)
MCH: 31.3 pg (ref 25.1–34.0)
MCHC: 33 g/dL (ref 31.5–36.0)
MCV: 94.8 fL (ref 79.5–101.0)
MONO#: 0.2 10*3/uL (ref 0.1–0.9)
MONO%: 7.3 % (ref 0.0–14.0)
NEUT%: 54.1 % (ref 38.4–76.8)
NEUTROS ABS: 1.3 10*3/uL — AB (ref 1.5–6.5)
NRBC: 0 % (ref 0–0)
PLATELETS: 254 10*3/uL (ref 145–400)
RBC: 3.26 10*6/uL — AB (ref 3.70–5.45)
RDW: 16.8 % — AB (ref 11.2–14.5)
WBC: 2.3 10*3/uL — AB (ref 3.9–10.3)
lymph#: 0.9 10*3/uL (ref 0.9–3.3)

## 2014-12-29 MED ORDER — SODIUM CHLORIDE 0.9 % IJ SOLN
10.0000 mL | INTRAMUSCULAR | Status: DC | PRN
  Administered 2014-12-29: 10 mL
  Filled 2014-12-29: qty 10

## 2014-12-29 MED ORDER — DIPHENHYDRAMINE HCL 50 MG/ML IJ SOLN
INTRAMUSCULAR | Status: AC
  Filled 2014-12-29: qty 1

## 2014-12-29 MED ORDER — FAMOTIDINE IN NACL 20-0.9 MG/50ML-% IV SOLN
20.0000 mg | Freq: Once | INTRAVENOUS | Status: AC
  Administered 2014-12-29: 20 mg via INTRAVENOUS

## 2014-12-29 MED ORDER — DIPHENHYDRAMINE HCL 50 MG/ML IJ SOLN
25.0000 mg | Freq: Once | INTRAMUSCULAR | Status: AC
  Administered 2014-12-29: 25 mg via INTRAVENOUS

## 2014-12-29 MED ORDER — HEPARIN SOD (PORK) LOCK FLUSH 100 UNIT/ML IV SOLN
500.0000 [IU] | Freq: Once | INTRAVENOUS | Status: AC | PRN
  Administered 2014-12-29: 500 [IU]
  Filled 2014-12-29: qty 5

## 2014-12-29 MED ORDER — PACLITAXEL CHEMO INJECTION 300 MG/50ML
80.0000 mg/m2 | Freq: Once | INTRAVENOUS | Status: AC
  Administered 2014-12-29: 156 mg via INTRAVENOUS
  Filled 2014-12-29: qty 26

## 2014-12-29 MED ORDER — SODIUM CHLORIDE 0.9 % IV SOLN
Freq: Once | INTRAVENOUS | Status: AC
  Administered 2014-12-29: 10:00:00 via INTRAVENOUS

## 2014-12-29 MED ORDER — FAMOTIDINE IN NACL 20-0.9 MG/50ML-% IV SOLN
INTRAVENOUS | Status: AC
  Filled 2014-12-29: qty 50

## 2014-12-29 MED ORDER — SODIUM CHLORIDE 0.9 % IV SOLN
Freq: Once | INTRAVENOUS | Status: AC
  Administered 2014-12-29: 11:00:00 via INTRAVENOUS
  Filled 2014-12-29: qty 4

## 2014-12-29 NOTE — Progress Notes (Signed)
[place]  [phone] [phone]  Clinic follow up Note   Patient Care Team: Katherina Mires, MD as PCP - General (Family Medicine) Autumn Messing III, MD as Consulting Physician (General Surgery) Sherren Mocha, MD as Consulting Physician (Cardiology) 12/29/2014  CHIEF COMPLAINTS:  Follow up left breast cancer   Oncology History   Breast cancer of upper-inner quadrant of left female breast   Staging form: Breast, AJCC 7th Edition     Clinical: Stage IIIA (T3, N1, M0) - Unsigned       Breast cancer of upper-inner quadrant of left female breast ([place])   08/06/2014 Initial Diagnosis Breast cancer of upper-inner quadrant of left female breast   08/06/2014 Initial Biopsy Left breast 9:30 o'clock position showed invasive ductal carcinoma, and 1 left axillary lymph node biopsy showed positive for metastatic carcinoma.   08/06/2014 Receptors her2 ER 100% positive, PR 5% positive, Ki-67 50%   08/06/2014 Mammogram Diagnostic mammogram and ultrasound showed left breast mass with associated calcification measuring 5.5 cm, indeterminate 8 left axillary lymph node was cortical thickening, circumscribed low density oval 29m mass at 12:00 within the left breast.   09/03/2014 -  Adjuvant Chemotherapy ddAC every 2 weeks X4, followed by weekly Taxol X12     HISTORY OF PRESENTING ILLNESS:  Mary Grill89y.o. female is here because of recently newly diagnosed left breast cancer. She presents to the clinic with her daughter.  This was discovered by screening mammogram. Her prior mammogram was 2 years ago. She noticed mild left nipple retraction, no palpable mass, no other changes. She feels well overall, denies any significant pain except mild right knee pain from arthritis. She denies any change of her appetite, or recent weight loss. 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   Her family history is notable for breast cancer in her brother,  pancreatic cancer in another brother, and colon cancer in her mother. She is postmenopausal, last menstrual period 8 years ago. She has a daughter and a son. She lives with her brother, and her daughter is likely going to stay with her.  CURRENT TREATMENT: weekly  Paclitaxel 80 mg/m, started on 11/10/2014, plan for total of 12 weeks  [place] and 7th week paclitaxel. She has been tolerating Taxol very well, much better than AC. She has a mild numbness tingling on few fingers, (+) pigmentation on the nail and palm, no other noticeable side effects. Her appetite has improved and she is eating better.She is more active. No diarrhea, pain, or other side effects. She is doing well overall at home.  MEDICAL HISTORY:  Past Medical History  Diagnosis Date  . Hypertension   . Angina [place] ([place]   . [place]STEMI (non-ST elevated [place] infarction) [place] 03/16[place]    non-obs CAD, spasm seen  . Pneumonia 2009  . Arthritis     "right knee" (03/16/2014)  . 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 Breast cancer ([place] 2016  . Family history of breast cancer   . Family history of pancreatic cancer   . Family history of breast cancer in female     SURGICAL HISTORY: Past Surgical History  Procedure Laterality Date  . Other surgical history  1991    Tubal pregnancy  . Cardiac catheterization  2010; 03/16/2014  . Tubal ligation  1981  . Left heart catheterization with coronary angiogram N/A 03/16/2014    Procedure: LEFT  HEART CATHETERIZATION WITH CORONARY ANGIOGRAM;  Surgeon: Sherren Mocha, MD; Non-obs CAD, EF 55%  . Bunionectomy Bilateral   . Breast biopsy Left 08/06/14  . Portacath placement N/A 08/28/2014    Procedure: INSERTION PORT-A-CATH;  Surgeon: Autumn Messing III, MD;  Location: [place];  Service: General;  Laterality: N/A;   GYN HISTORY  Menarchal: 15 LMP: 59 Contraceptive: 4 HRT: no G2P2: no breast feeding     SOCIAL  HISTORY: Social History   Social History  . Marital Status: Legally Separated    Spouse Name: N/A  . Number of Children: Daughter 20, son 33   . Years of Education: N/A   Occupational History  . Works for post office    Social History Main Topics  . Smoking status: Never Smoker   . Smokeless tobacco: Never Used  . Alcohol Use: No  . Drug Use: No  . Sexual Activity: No   Other Topics Concern  . Not on file   Social History Narrative    FAMILY HISTORY: Family History  Problem Relation Age of Onset  . Pancreatic cancer Brother 89  . Cancer Brother 40    breast cancer   . Cancer Mother 92    colon cancer   . Hypertension Mother   . Diabetes type II Mother   . CVA Father   . Healthy Sister   . Healthy Sister     ALLERGIES:  is allergic to codeine; carvedilol; compazine; percocet; and adhesive.  MEDICATIONS:  Current Outpatient Prescriptions  Medication Sig Dispense Refill  . acyclovir (ZOVIRAX) 400 MG tablet Take 1 tablet (400 mg total) by mouth 3 (three) times daily. 30 tablet 1  . amLODipine (NORVASC) 10 MG tablet Take 1 tablet (10 mg total) by mouth daily. 30 tablet 3  . aprepitant (EMEND) 80 MG capsule Take 1 capsule (80 mg total) by mouth daily. Start the day after chemotherapy. 3 capsule 1  . atorvastatin (LIPITOR) 40 MG tablet Take 1 tablet (40 mg total) by mouth daily at 6 PM. 30 tablet 11  . dexamethasone (DECADRON) 4 MG tablet Take 2 pills once the day after chemotherapy and then 2 times a day for 2 days. Take with food (Patient not taking: Reported on 11/03/2014) 30 tablet 1  . hydrochlorothiazide (HYDRODIURIL) 25 MG tablet Take 25 mg by mouth daily.    . irbesartan (AVAPRO) 300 MG tablet Take 1 tablet (300 mg total) by mouth daily. 30 tablet 2  . isosorbide mononitrate (IMDUR) 30 MG 24 hr tablet Take 1 tablet (30 mg total) by mouth daily. 30 tablet 1  . Lidocaine-Prilocaine, Bulk, 2.5-2.5 % CREA Apply 5 mLs topically as needed. 1.5 hours before use with  chemotherapy.Cover with plastic 30 g 3  . LORazepam (ATIVAN) 0.5 MG tablet Take 1 tablet by mouth every 6 hours as needed for nausea or vomiting. 30 tablet 1  . nitroGLYCERIN (NITROSTAT) 0.4 MG SL tablet Place 1 tablet (0.4 mg total) under the tongue every 5 (five) minutes as needed for chest pain. (Patient not taking: Reported on 11/03/2014) 25 tablet 12  . ondansetron (ZOFRAN) 8 MG tablet Take 1 tablet (8 mg total) by mouth 2 (two) times daily as needed for nausea or vomiting. Start on 3rd day after Chemotherapy (Patient not taking: Reported on 11/30/2014) 30 tablet 1  . potassium chloride (K-DUR) 10 MEQ tablet Take 1 tablet (10 mEq total) by mouth daily. 30 tablet 11  . promethazine (PHENERGAN) 12.5 MG tablet Take 1-2 tablets (12.5-25 mg total)  by mouth every 6 (six) hours as needed for nausea or vomiting. 40 tablet 2   No current facility-administered medications for this visit.    REVIEW OF SYSTEMS:   Constitutional: Denies fevers, chills or abnormal night sweats Eyes: Denies blurriness of vision, double vision or watery eyes Ears, nose, mouth, throat, and face: Denies mucositis or sore throat Respiratory: Denies cough, dyspnea or wheezes Cardiovascular: Denies palpitation, chest discomfort or lower extremity swelling Gastrointestinal:  Denies nausea, heartburn or change in bowel habits Skin: Denies abnormal skin rashes Lymphatics: Denies new lymphadenopathy or easy bruising Neurological:Denies numbness, tingling or new weaknesses Behavioral/Psych: Mood is stable, no new changes  All other systems were reviewed with the patient and are negative.  PHYSICAL EXAMINATION: ECOG PERFORMANCE STATUS: 1-2  Filed Vitals:   12/29/14 0932  BP: 126/79  Pulse: 105  Temp: 98.2 F (36.8 C)  Resp: 18   Filed Weights   12/29/14 0932  Weight: 179 lb 6.4 oz (81.375 kg)    GENERAL:alert, no distress and comfortable SKIN: skin color, texture, turgor are normal, no rashes or significant lesions,  previous skin ulcers in the area above her vagina has healed  EYES: normal, conjunctiva are pink and non-injected, sclera clear OROPHARYNX:no exudate, no erythema and lips, buccal mucosa, and tongue normal  NECK: supple, thyroid normal size, non-tender, without nodularity LYMPH:  no palpable lymphadenopathy in the cervical, axillary or inguinal LUNGS: clear to auscultation and percussion with normal breathing effort HEART: regular rate & rhythm and no murmurs and no lower extremity edema ABDOMEN:abdomen soft, non-tender and normal bowel sounds Musculoskeletal:no cyanosis of digits and no clubbing  PSYCH: alert & oriented x 3 with fluent speech NEURO: no focal motor/sensory deficits Breasts: Breast inspection showed them to be symmetrical with no nipple discharge. Slight left nipple retraction. A 1.0cm (imnitially 2.5 cm) mass behind the nipple. Palpation of the right breasts and both axillas revealed no obvious mass that I could appreciate.   LABORATORY DATA:  I have reviewed the data as listed CBC Latest Ref Rng 12/29/2014 12/22/2014 12/15/2014  WBC 3.9 - 10.3 10e3/uL 2.3(L) 3.0(L) 2.8(L)  Hemoglobin 11.6 - 15.9 g/dL 10.2(L) 10.9(L) 10.3(L)  Hematocrit 34.8 - 46.6 % 30.9(L) 33.2(L) 31.3(L)  Platelets 145 - 400 10e3/uL 254 248 231    CMP Latest Ref Rng 12/29/2014 12/22/2014 12/15/2014  Glucose 70 - 140 mg/dl 131 110 114  BUN 7.0 - 26.0 mg/dL 12.3 11.1 12.0  Creatinine 0.6 - 1.1 mg/dL 1.2(H) 1.0 1.0  Sodium 136 - 145 mEq/L 139 140 140  Potassium 3.5 - 5.1 mEq/L 3.7 3.5 3.9  Chloride 101 - 111 mmol/L - - -  CO2 22 - 29 mEq/L _0 Calcium 8.4 - 10.4 mg/dL 10.5(H) 10.6(H) 10.4  Total Protein 6.4 - 8.3 g/dL 6.8 6.8 6.6  Total Bilirubin 0.20 - 1.20 mg/dL 0.68 0.73 0.63  Alkaline Phos 40 - 150 U/L 68 66 71  AST 5 - 34 U/L _1 ALT 0 - 55 U/L _2 ANC 1.6 today    PATHOLOGY REPORT:  Diagnosis 08/06/2014 1. Breast, left, needle core biopsy, 9:30 o'clock, 1 CMFN -  INVASIVE DUCTAL CARCINOMA, SEE COMMENT. 2. Lymph node, needle/core biopsy, left axillary lymph node - ONE LYMPH NODE, POSITIVE FOR METASTATIC MAMMARY CARCINOMA (1/1) 1. Although the grade of tumor is best assessed at resection, with these biopsies, the invasive tumor is grade 1. Breast prognostic studies are pending (part 1 only) and will be reported  in an addendum. The case is reviewed with Dr. Avis Epley who concurs. (CRR:gt, 08/07/14)  Results: IMMUNOHISTOCHEMICAL AND MORPHOMETRIC ANALYSIS BY THE AUTOMATED CELLULAR IMAGING SYSTEM (ACIS) Estrogen Receptor: 100%, POSITIVE, STRONG STAINING INTENSITY (PERFORMED MANUALLY) Progesterone Receptor: 5%, POSITIVE, WEAK STAINING INTENSITY Proliferation Marker Ki67: 50% (PERFORMED MANUALLY)  Results: HER2 - NEGATIVE RATIO OF HER2/CEP17 SIGNALS 1.47 AVERAGE HER2 COPY NUMBER PER CELL 2.35  RADIOGRAPHIC STUDIES: I have personally reviewed the radiological images as listed and agreed with the findings in the report.  US Breast Ltd Uni Left Inc Axilla 08/06/2014    FINDINGS: CC and MLO views of the left breast with tomosynthesis and CC and ML magnification views were performed. There is an approximately 2 cm irregular mass within the slightly medial, subareolar left breast. In addition, there are associated coarse heterogeneous calcifications. In total, the mass and calcifications span approximately 5.5 cm. A prominent lymph node is noted within the left axilla. Several adjacent oval, circumscribed low-density masses are noted within the left breast at 12 o'clock, middle depth.  Mammographic images were processed with CAD.  On physical exam, a palpable mass is noted within the left breast at 930, 1 cm from the nipple. No discrete mass is felt in the 12 o'clock region.  Targeted ultrasound is performed, showing an irregular hypoechoic mass at 930, 1 cm from the nipple measuring approximately 15 x 13 x 14 mm, corresponding to the palpable abnormality on exam. Dilated  ducts extend from the mass to the level of the nipple, concerning for intraductal extension. The posterior extent of the calcifications seen mammographically is not well seen on ultrasound. At 12:30, 5 cm from the nipple, there is an oval, hypoechoic, circumscribed mass measuring approximately 8 x 8 x 2 mm which is thought to correspond to the largest of the oval low-density masses seen mammographically at 12 o'clock. No internal vascularity is identified.  Targeted exam of the left axilla demonstrates an abnormal appearing lymph node with cortical thickening measuring up to 5 mm.    IMPRESSION: 1. Highly suspicious left breast mass with associated pleomorphic calcifications which together span approximately 5.5 cm. Sonographic findings are concerning for intraductal extension of carcinoma to the level of the nipple. 2. Indeterminate left axillary lymph node with cortical thickening. 3. Circumscribed, low-density oval masses at 12 o'clock within the left breast.  RECOMMENDATION: 1. Ultrasound-guided core biopsy of the left breast mass at 930 and of the left axillary lymph node with cortical thickening. If biopsy of the left breast mass at 930 demonstrates malignancy, breast MRI is recommended for further evaluation of nipple involvement. 2. The patient's outside prior mammograms will be obtained for comparison. If the low density oval masses seen within the left breast at 12 o'clock are unchanged in appearance, no further evaluation is recommended. An addendum will be made when the comparison films are available.  I have discussed the findings and recommendations with the patient. Results were also provided in writing at the conclusion of the visit. If applicable, a reminder letter will be sent to the patient regarding the next appointment.  BI-RADS CATEGORY  5: Highly suggestive of malignancy.   Electronically Signed   By: Pamelia Hoit M.D.   On: 08/06/2014 12:16   Breast MRI 08/25/2014 Right breast: No mass or  abnormal enhancement.  Left breast: There is an irregular mass immediately posterior to the left nipple. Mass demonstrates rapid persistent type enhancement kinetics and extends to the base of the nipple. The within the mass there is tissue  marker clip artifact following recent ultrasound-guided core biopsy. Posterior to the mass there is clumped linear non mass enhancement extending into the lower inner quadrant, suspicious for ductal carcinoma in situ. This enhancement correlates well with the suspicious microcalcifications seen mammographically. Mass and non mass enhancement measured together are 2.5 x 2.5 x 6.6 (anterior to posterior) cm. No additional sites of concern are identified within the left breast.  Lymph nodes: Enlarged lower left axillary lymph node is identified, associated with tissue marker clip following recent ultrasound-guided core biopsy. Additional normal morphology and left axillary lymph nodes are demonstrated. Right axillary lymph nodes and internal mammary chain are unremarkable in appearance.  Ancillary findings: None.  IMPRESSION: 1. Mass and non mass enhancement in the retroareolar and lower inner quadrant of the left breast. Enhancement from the mass does appear to involve the base of the left nipple. The extent of disease is felt to be well demonstrated mammographically. 2. Enlarged left axillary lymph node, corresponding to known metastatic disease as identified on recent core biopsy. 3. Right breast is negative.   Echo 08/24/2014 Study Conclusions  - Left ventricle: The cavity size was normal. Wall thickness was normal. Systolic function was normal. The estimated ejection fraction was in the range of 55% to 60%. - Left atrium: The atrium was mildly dilated.  Bone scan 08/31/2014 IMPRESSION: 1. Tiny focus of increased uptake on the right at approximately T8. No abnormalities demonstrated on the coronal or sagittal images through the  thoracic spine on the previous CT scan. Mild bilateral increased uptake at approximately T4 that is likely degenerative. 2. No abnormal uptake observed elsewhere.  CT chest, abdomen or pelvis 08/31/2014 IMPRESSION: 2 cm left breast mass, consistent with known primary breast carcinoma. No evidence of metastatic disease within the chest, abdomen, or pelvis.  Small hiatal hernia incidentally noted.    ASSESSMENT & PLAN:  59 year old African-American female, postmenopausal, healthy and fit, who was found to have a left breast mass and a biopsy confirmed node metastasis by screening mammogram.  1. Left breast invasive ductal carcinoma, cT3N1M0, stage IIIA, ER+/PR+/HER2- -Reviewed her mammograms, breast ultrasound and MRI findings and core needle biopsy results. -Reviewed her bone scan and CT scan results with her today. She has a very small uptake at T8 on bone scan, but no collar related changes on the CT scan. She is not symptomatic, I think the likelihood of bone metastasis is very low. CT scan was negative for any distant metastasis. -neoadjuvant chemotherapy was recommended. Giving the large primary tumor, node positive disease, high Ki-67, she has very high risk of cancer recurrence after surgery. -given her ER+/HER2- disease, recomended dose dense Adriamycin and Cytoxan every 2 weeks for fourth cycle, followed by weekly Taxol for 12 weeks. -Giving her positive lymph nodes, she would likely need adjuvant radiation.  -Echocardiogram showed normal EF. - she is tolerating weekly Taxol very well, will continue.  Her ANC is 1.3. THis was reviewed with dr. Benay Spice who recommended to proceed as patient is otherwise clinically stable to proceed Will continue as planned  2. fatigue -secondary to chemo  - much improved since she completed AC.  -She was strongly envouraged her to be be more physically active, she is doing much better.  3. Cancer genetics She has very strong family history  of breast, colon and pancreatic cancer, all in first-degree, recommend her to see genetic counselor to ruled out inheritable breast cancer syndrome.  -her genetic testing result was normal  4. History of non-STEMI in  03/2014 -She had cardiac catheterization, which showed mild diffuse coronary artery disease, but no need intervention.  -She will follow-up with her cardiologist Dr. Burt Knack -We discussed some of the chemotherapy drug may cause heart failure, we'll repeat her echo after chemo, she may need follow-up with Dr. Burt Knack during her chemotherapy.   5. UTI  -She was treated for UTI 2 weeks ago, resolved.  6. Pancytopenia secondary to chemotherapy -Will monitor her CBC weekly. No need blood transfusion for now.  Plan - continue weekly Taxol. Will return on 01/12/15 for follow up with Dr. Burr Medico  All questions were answered. The patient knows to call the clinic with any problems, questions or concerns.      Rondel Jumbo, PA-C 12/29/2014 10:02 AM

## 2014-12-29 NOTE — Patient Instructions (Signed)
[facility] Discharge Instructions for Patients Receiving Chemotherapy  Today you received the following chemotherapy agents TAXOL  To help prevent nausea and vomiting after your treatment, we encourage you to take your nausea medication as prescribed.   If you develop nausea and vomiting that is not controlled by your nausea medication, call the clinic.   BELOW ARE SYMPTOMS THAT SHOULD BE REPORTED IMMEDIATELY:  *FEVER GREATER THAN 100.5 F  *CHILLS WITH OR WITHOUT FEVER  NAUSEA AND VOMITING THAT IS NOT CONTROLLED WITH YOUR NAUSEA MEDICATION  *UNUSUAL SHORTNESS OF BREATH  *UNUSUAL BRUISING OR BLEEDING  TENDERNESS IN MOUTH AND THROAT WITH OR WITHOUT PRESENCE OF ULCERS  *URINARY PROBLEMS  *BOWEL PROBLEMS  UNUSUAL RASH Items with * indicate a potential emergency and should be followed up as soon as possible.  Feel free to call the clinic you have any questions or concerns. The clinic phone number is [phone].  Please show the CHEMO ALERT CARD at check-in to the Emergency Department and triage nurse.   

## 2015-01-03 HISTORY — PX: BREAST LUMPECTOMY: SHX2

## 2015-01-05 ENCOUNTER — Ambulatory Visit (HOSPITAL_BASED_OUTPATIENT_CLINIC_OR_DEPARTMENT_OTHER): Payer: Federal, State, Local not specified - PPO

## 2015-01-05 ENCOUNTER — Other Ambulatory Visit: Payer: Federal, State, Local not specified - PPO

## 2015-01-05 ENCOUNTER — Other Ambulatory Visit (HOSPITAL_BASED_OUTPATIENT_CLINIC_OR_DEPARTMENT_OTHER): Payer: Federal, State, Local not specified - PPO

## 2015-01-05 VITALS — BP 107/75 | HR 104 | Temp 98.3°F

## 2015-01-05 DIAGNOSIS — C50212 Malignant neoplasm of upper-inner quadrant of left female breast: Secondary | ICD-10-CM

## 2015-01-05 DIAGNOSIS — Z5111 Encounter for antineoplastic chemotherapy: Secondary | ICD-10-CM | POA: Diagnosis not present

## 2015-01-05 DIAGNOSIS — C773 Secondary and unspecified malignant neoplasm of axilla and upper limb lymph nodes: Secondary | ICD-10-CM

## 2015-01-05 LAB — COMPREHENSIVE METABOLIC PANEL
ALBUMIN: 4 g/dL (ref 3.5–5.0)
ALK PHOS: 70 U/L (ref 40–150)
ALT: 22 U/L (ref 0–55)
ANION GAP: 10 meq/L (ref 3–11)
AST: 16 U/L (ref 5–34)
BILIRUBIN TOTAL: 0.59 mg/dL (ref 0.20–1.20)
BUN: 16.1 mg/dL (ref 7.0–26.0)
CALCIUM: 10.5 mg/dL — AB (ref 8.4–10.4)
CO2: 25 mEq/L (ref 22–29)
CREATININE: 1 mg/dL (ref 0.6–1.1)
Chloride: 103 mEq/L (ref 98–109)
EGFR: 68 mL/min/{1.73_m2} — ABNORMAL LOW (ref >90)
Glucose: 95 mg/dl (ref 70–140)
Potassium: 3.9 mEq/L (ref 3.5–5.1)
Sodium: 138 mEq/L (ref 136–145)
Total Protein: 7 g/dL (ref 6.4–8.3)

## 2015-01-05 LAB — CBC WITH DIFFERENTIAL/PLATELET
BASO%: 0.3 % (ref 0.0–2.0)
BASOS ABS: 0 10*3/uL (ref 0.0–0.1)
EOS%: 0.6 % (ref 0.0–7.0)
Eosinophils Absolute: 0 10*3/uL (ref 0.0–0.5)
HEMATOCRIT: 31.9 % — AB (ref 34.8–46.6)
HEMOGLOBIN: 10.7 g/dL — AB (ref 11.6–15.9)
LYMPH#: 1.3 10*3/uL (ref 0.9–3.3)
LYMPH%: 40.8 % (ref 14.0–49.7)
MCH: 31.7 pg (ref 25.1–34.0)
MCHC: 33.5 g/dL (ref 31.5–36.0)
MCV: 94.4 fL (ref 79.5–101.0)
MONO#: 0.3 10*3/uL (ref 0.1–0.9)
MONO%: 8.2 % (ref 0.0–14.0)
NEUT#: 1.6 10*3/uL (ref 1.5–6.5)
NEUT%: 50.1 % (ref 38.4–76.8)
PLATELETS: 262 10*3/uL (ref 145–400)
RBC: 3.38 10*6/uL — ABNORMAL LOW (ref 3.70–5.45)
RDW: 16 % — AB (ref 11.2–14.5)
WBC: 3.2 10*3/uL — ABNORMAL LOW (ref 3.9–10.3)

## 2015-01-05 MED ORDER — SODIUM CHLORIDE 0.9 % IV SOLN
Freq: Once | INTRAVENOUS | Status: AC
  Administered 2015-01-05: 15:00:00 via INTRAVENOUS
  Filled 2015-01-05: qty 4

## 2015-01-05 MED ORDER — DIPHENHYDRAMINE HCL 50 MG/ML IJ SOLN
INTRAMUSCULAR | Status: AC
  Filled 2015-01-05: qty 1

## 2015-01-05 MED ORDER — SODIUM CHLORIDE 0.9 % IV SOLN
Freq: Once | INTRAVENOUS | Status: AC
  Administered 2015-01-05: 15:00:00 via INTRAVENOUS

## 2015-01-05 MED ORDER — PACLITAXEL CHEMO INJECTION 300 MG/50ML
80.0000 mg/m2 | Freq: Once | INTRAVENOUS | Status: AC
  Administered 2015-01-05: 156 mg via INTRAVENOUS
  Filled 2015-01-05: qty 26

## 2015-01-05 MED ORDER — DIPHENHYDRAMINE HCL 50 MG/ML IJ SOLN
25.0000 mg | Freq: Once | INTRAMUSCULAR | Status: AC
  Administered 2015-01-05: 25 mg via INTRAVENOUS

## 2015-01-05 MED ORDER — FAMOTIDINE IN NACL 20-0.9 MG/50ML-% IV SOLN
20.0000 mg | Freq: Once | INTRAVENOUS | Status: AC
  Administered 2015-01-05: 20 mg via INTRAVENOUS

## 2015-01-05 MED ORDER — FAMOTIDINE IN NACL 20-0.9 MG/50ML-% IV SOLN
INTRAVENOUS | Status: AC
  Filled 2015-01-05: qty 50

## 2015-01-05 MED ORDER — SODIUM CHLORIDE 0.9 % IJ SOLN
10.0000 mL | INTRAMUSCULAR | Status: DC | PRN
  Administered 2015-01-05: 10 mL
  Filled 2015-01-05: qty 10

## 2015-01-05 MED ORDER — HEPARIN SOD (PORK) LOCK FLUSH 100 UNIT/ML IV SOLN
500.0000 [IU] | Freq: Once | INTRAVENOUS | Status: AC | PRN
  Administered 2015-01-05: 500 [IU]
  Filled 2015-01-05: qty 5

## 2015-01-05 NOTE — Patient Instructions (Signed)
[facility] Discharge Instructions for Patients Receiving Chemotherapy  Today you received the following chemotherapy agents:  Taxol  To help prevent nausea and vomiting after your treatment, we encourage you to take your nausea medication as prescribed.   If you develop nausea and vomiting that is not controlled by your nausea medication, call the clinic.   BELOW ARE SYMPTOMS THAT SHOULD BE REPORTED IMMEDIATELY:  *FEVER GREATER THAN 100.5 F  *CHILLS WITH OR WITHOUT FEVER  NAUSEA AND VOMITING THAT IS NOT CONTROLLED WITH YOUR NAUSEA MEDICATION  *UNUSUAL SHORTNESS OF BREATH  *UNUSUAL BRUISING OR BLEEDING  TENDERNESS IN MOUTH AND THROAT WITH OR WITHOUT PRESENCE OF ULCERS  *URINARY PROBLEMS  *BOWEL PROBLEMS  UNUSUAL RASH Items with * indicate a potential emergency and should be followed up as soon as possible.  Feel free to call the clinic you have any questions or concerns. The clinic phone number is [phone].  Please show the CHEMO ALERT CARD at check-in to the Emergency Department and triage nurse.   

## 2015-01-12 ENCOUNTER — Ambulatory Visit (HOSPITAL_BASED_OUTPATIENT_CLINIC_OR_DEPARTMENT_OTHER): Payer: Federal, State, Local not specified - PPO | Admitting: Hematology

## 2015-01-12 ENCOUNTER — Other Ambulatory Visit (HOSPITAL_BASED_OUTPATIENT_CLINIC_OR_DEPARTMENT_OTHER): Payer: Federal, State, Local not specified - PPO

## 2015-01-12 ENCOUNTER — Telehealth: Payer: Self-pay | Admitting: Hematology

## 2015-01-12 ENCOUNTER — Encounter: Payer: Self-pay | Admitting: Hematology

## 2015-01-12 ENCOUNTER — Ambulatory Visit (HOSPITAL_BASED_OUTPATIENT_CLINIC_OR_DEPARTMENT_OTHER): Payer: Federal, State, Local not specified - PPO

## 2015-01-12 VITALS — BP 103/70 | HR 113 | Temp 98.3°F | Resp 18 | Ht 67.0 in | Wt 178.6 lb

## 2015-01-12 DIAGNOSIS — C773 Secondary and unspecified malignant neoplasm of axilla and upper limb lymph nodes: Secondary | ICD-10-CM | POA: Diagnosis not present

## 2015-01-12 DIAGNOSIS — C50212 Malignant neoplasm of upper-inner quadrant of left female breast: Secondary | ICD-10-CM

## 2015-01-12 DIAGNOSIS — Z5111 Encounter for antineoplastic chemotherapy: Secondary | ICD-10-CM | POA: Diagnosis not present

## 2015-01-12 DIAGNOSIS — D6481 Anemia due to antineoplastic chemotherapy: Secondary | ICD-10-CM

## 2015-01-12 DIAGNOSIS — D61810 Antineoplastic chemotherapy induced pancytopenia: Secondary | ICD-10-CM

## 2015-01-12 DIAGNOSIS — T451X5A Adverse effect of antineoplastic and immunosuppressive drugs, initial encounter: Secondary | ICD-10-CM

## 2015-01-12 LAB — COMPREHENSIVE METABOLIC PANEL
ALT: 26 U/L (ref 0–55)
AST: 17 U/L (ref 5–34)
Albumin: 3.9 g/dL (ref 3.5–5.0)
Alkaline Phosphatase: 71 U/L (ref 40–150)
Anion Gap: 12 mEq/L — ABNORMAL HIGH (ref 3–11)
BUN: 15.5 mg/dL (ref 7.0–26.0)
CHLORIDE: 104 meq/L (ref 98–109)
CO2: 23 meq/L (ref 22–29)
CREATININE: 1.3 mg/dL — AB (ref 0.6–1.1)
Calcium: 10.8 mg/dL — ABNORMAL HIGH (ref 8.4–10.4)
EGFR: 51 mL/min/{1.73_m2} — ABNORMAL LOW (ref >90)
GLUCOSE: 133 mg/dL (ref 70–140)
Potassium: 3.8 mEq/L (ref 3.5–5.1)
SODIUM: 140 meq/L (ref 136–145)
Total Bilirubin: 0.66 mg/dL (ref 0.20–1.20)
Total Protein: 6.8 g/dL (ref 6.4–8.3)

## 2015-01-12 LAB — CBC WITH DIFFERENTIAL/PLATELET
BASO%: 0.3 % (ref 0.0–2.0)
BASOS ABS: 0 10*3/uL (ref 0.0–0.1)
EOS ABS: 0 10*3/uL (ref 0.0–0.5)
EOS%: 0.6 % (ref 0.0–7.0)
HEMATOCRIT: 32.1 % — AB (ref 34.8–46.6)
HEMOGLOBIN: 10.6 g/dL — AB (ref 11.6–15.9)
LYMPH#: 1.3 10*3/uL (ref 0.9–3.3)
LYMPH%: 39.5 % (ref 14.0–49.7)
MCH: 31.4 pg (ref 25.1–34.0)
MCHC: 33 g/dL (ref 31.5–36.0)
MCV: 95 fL (ref 79.5–101.0)
MONO#: 0.2 10*3/uL (ref 0.1–0.9)
MONO%: 6.2 % (ref 0.0–14.0)
NEUT%: 53.4 % (ref 38.4–76.8)
NEUTROS ABS: 1.8 10*3/uL (ref 1.5–6.5)
Platelets: 240 10*3/uL (ref 145–400)
RBC: 3.38 10*6/uL — AB (ref 3.70–5.45)
RDW: 15.7 % — AB (ref 11.2–14.5)
WBC: 3.4 10*3/uL — AB (ref 3.9–10.3)

## 2015-01-12 MED ORDER — HEPARIN SOD (PORK) LOCK FLUSH 100 UNIT/ML IV SOLN
500.0000 [IU] | Freq: Once | INTRAVENOUS | Status: AC | PRN
  Administered 2015-01-12: 500 [IU]
  Filled 2015-01-12: qty 5

## 2015-01-12 MED ORDER — SODIUM CHLORIDE 0.9 % IV SOLN
INTRAVENOUS | Status: AC
  Administered 2015-01-12: 10:00:00 via INTRAVENOUS

## 2015-01-12 MED ORDER — FAMOTIDINE IN NACL 20-0.9 MG/50ML-% IV SOLN
20.0000 mg | Freq: Once | INTRAVENOUS | Status: AC
  Administered 2015-01-12: 20 mg via INTRAVENOUS

## 2015-01-12 MED ORDER — SODIUM CHLORIDE 0.9 % IJ SOLN
10.0000 mL | INTRAMUSCULAR | Status: DC | PRN
  Administered 2015-01-12: 10 mL
  Filled 2015-01-12: qty 10

## 2015-01-12 MED ORDER — DIPHENHYDRAMINE HCL 50 MG/ML IJ SOLN
INTRAMUSCULAR | Status: AC
  Filled 2015-01-12: qty 1

## 2015-01-12 MED ORDER — FAMOTIDINE IN NACL 20-0.9 MG/50ML-% IV SOLN
INTRAVENOUS | Status: AC
  Filled 2015-01-12: qty 50

## 2015-01-12 MED ORDER — SODIUM CHLORIDE 0.9 % IV SOLN
Freq: Once | INTRAVENOUS | Status: AC
  Administered 2015-01-12: 11:00:00 via INTRAVENOUS

## 2015-01-12 MED ORDER — PACLITAXEL CHEMO INJECTION 300 MG/50ML
80.0000 mg/m2 | Freq: Once | INTRAVENOUS | Status: AC
  Administered 2015-01-12: 156 mg via INTRAVENOUS
  Filled 2015-01-12: qty 26

## 2015-01-12 MED ORDER — DIPHENHYDRAMINE HCL 50 MG/ML IJ SOLN
25.0000 mg | Freq: Once | INTRAMUSCULAR | Status: AC
  Administered 2015-01-12: 25 mg via INTRAVENOUS

## 2015-01-12 MED ORDER — SODIUM CHLORIDE 0.9 % IV SOLN
Freq: Once | INTRAVENOUS | Status: AC
  Administered 2015-01-12: 11:00:00 via INTRAVENOUS
  Filled 2015-01-12: qty 4

## 2015-01-12 NOTE — Progress Notes (Signed)
[place]  [phone] [phone]  Clinic follow up Note   Patient Care Team: Katherina Mires, MD as PCP - General (Family Medicine) Autumn Messing III, MD as Consulting Physician (General Surgery) Sherren Mocha, MD as Consulting Physician (Cardiology) 01/12/2015  CHIEF COMPLAINTS:  Follow up left breast cancer   Oncology History   Breast cancer of upper-inner quadrant of left female breast   Staging form: Breast, AJCC 7th Edition     Clinical: Stage IIIA (T3, N1, M0) - Unsigned       Breast cancer of upper-inner quadrant of left female breast ([place])   08/06/2014 Initial Diagnosis Breast cancer of upper-inner quadrant of left female breast   08/06/2014 Initial Biopsy Left breast 9:30 o'clock position showed invasive ductal carcinoma, and 1 left axillary lymph node biopsy showed positive for metastatic carcinoma.   08/06/2014 Receptors her2 ER 100% positive, PR 5% positive, HER2-, Ki-67 50%   08/06/2014 Mammogram Diagnostic mammogram and ultrasound showed left breast mass with associated calcification measuring 5.5 cm, indeterminate 8 left axillary lymph node was cortical thickening, circumscribed low density oval 5m mass at 12:00 within the left breast.   09/03/2014 -  Adjuvant Chemotherapy ddAC every 2 weeks X4, followed by weekly Taxol X12     HISTORY OF PRESENTING ILLNESS:  Mary Grill60y.o. female is here because of recently newly diagnosed left breast cancer. She presents to the clinic with her daughter.  This was discovered by screening mammogram. Her prior mammogram was 2 years ago. She noticed mild left nipple retraction, no palpable mass, no other changes. She feels well overall, denies any significant pain except mild right knee pain from arthritis. She denies any change of her appetite, or recent weight loss. 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   Her family history is notable for breast cancer in her brother,  pancreatic cancer in another brother, and colon cancer in her mother. She is postmenopausal, last menstrual period 8 years ago. She has a daughter and a son. She lives with her brother, and her daughter is likely going to stay with her.  CURRENT TREATMENT: weekly  Paclitaxel 80 mg/m, started on 11/10/2014, [place] 12 [place] 10[place] paclitaxel. She has been tolerating Taxol well overall. She has mild fatigue, She does feel exhausted sometime after a shower, but she is still able to function well at home. She is in mild tingling in the fingers, no difficulty with hand functions. No other complaints.  MEDICAL HISTORY:  Past Medical History  Diagnosis Date  . Hypertension   . Angina effort ([place]   . NSTEMI (non-ST elevated myocardial infarction) ([place] 03/16[place]2014    non-obs CAD, spasm seen  . Pneumonia 2009  . Arthritis     "right knee" (03/16/2014)  . 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 [place]  . Family history of breast cancer   . Family history of pancreatic cancer   . Family history of breast cancer in female     SURGICAL HISTORY: Past Surgical History  Procedure Laterality Date  . Other surgical history  1991    Tubal pregnancy  . Cardiac catheterization  2010; 03/16/2014  . Tubal ligation  1981  . Left heart catheterization with coronary angiogram N/A 03/16/2014    Procedure: LEFT HEART CATHETERIZATION WITH CORONARY ANGIOGRAM;  Surgeon: MSherren Mocha MD; Non-obs CAD,  EF 55%  . Bunionectomy Bilateral   . Breast biopsy Left 08/06/14  . Portacath placement N/A 08/28/2014    Procedure: INSERTION PORT-A-CATH;  Surgeon: Autumn Messing III, MD;  Location: [place];  Service: General;  Laterality: N/A;   GYN HISTORY  Menarchal: 15 LMP: 51 Contraceptive: 3 HRT: no G2P2: no breast feeding     SOCIAL HISTORY: Social History   Social History  . Marital Status: Legally Separated     Spouse Name: N/A  . Number of Children: Daughter 22, son 25   . Years of Education: N/A   Occupational History  . Works for post office    Social History Main Topics  . Smoking status: Never Smoker   . Smokeless tobacco: Never Used  . Alcohol Use: No  . Drug Use: No  . Sexual Activity: No   Other Topics Concern  . Not on file   Social History Narrative    FAMILY HISTORY: Family History  Problem Relation Age of Onset  . Pancreatic cancer Brother 71  . Cancer Brother 40    breast cancer   . Cancer Mother 28    colon cancer   . Hypertension Mother   . Diabetes type II Mother   . CVA Father   . Healthy Sister   . Healthy Sister     ALLERGIES:  is allergic to codeine; carvedilol; compazine; percocet; and adhesive.  MEDICATIONS:  Current Outpatient Prescriptions  Medication Sig Dispense Refill  . acyclovir (ZOVIRAX) 400 MG tablet Take 1 tablet (400 mg total) by mouth 3 (three) times daily. 30 tablet 1  . amLODipine (NORVASC) 10 MG tablet Take 1 tablet (10 mg total) by mouth daily. 30 tablet 3  . aprepitant (EMEND) 80 MG capsule Take 1 capsule (80 mg total) by mouth daily. Start the day after chemotherapy. 3 capsule 1  . atorvastatin (LIPITOR) 40 MG tablet Take 1 tablet (40 mg total) by mouth daily at 6 PM. 30 tablet 11  . dexamethasone (DECADRON) 4 MG tablet Take 2 pills once the day after chemotherapy and then 2 times a day for 2 days. Take with food 30 tablet 1  . hydrochlorothiazide (HYDRODIURIL) 25 MG tablet Take 25 mg by mouth daily.    . irbesartan (AVAPRO) 300 MG tablet Take 1 tablet (300 mg total) by mouth daily. 30 tablet 2  . isosorbide mononitrate (IMDUR) 30 MG 24 hr tablet Take 1 tablet (30 mg total) by mouth daily. 30 tablet 1  . Lidocaine-Prilocaine, Bulk, 2.5-2.5 % CREA Apply 5 mLs topically as needed. 1.5 hours before use with chemotherapy.Cover with plastic 30 g 3  . LORazepam (ATIVAN) 0.5 MG tablet Take 1 tablet by mouth every 6 hours as needed for nausea  or vomiting. 30 tablet 1  . ondansetron (ZOFRAN) 8 MG tablet Take 1 tablet (8 mg total) by mouth 2 (two) times daily as needed for nausea or vomiting. Start on 3rd day after Chemotherapy 30 tablet 1  . potassium chloride (K-DUR) 10 MEQ tablet Take 1 tablet (10 mEq total) by mouth daily. 30 tablet 11  . promethazine (PHENERGAN) 12.5 MG tablet Take 1-2 tablets (12.5-25 mg total) by mouth every 6 (six) hours as needed for nausea or vomiting. 40 tablet 2  . nitroGLYCERIN (NITROSTAT) 0.4 MG SL tablet Place 1 tablet (0.4 mg total) under the tongue every 5 (five) minutes as needed for chest pain. (Patient not taking: Reported on 11/03/2014) 25 tablet 12   No current facility-administered medications for this visit.  REVIEW OF SYSTEMS:   Constitutional: Denies fevers, chills or abnormal night sweats Eyes: Denies blurriness of vision, double vision or watery eyes Ears, nose, mouth, throat, and face: Denies mucositis or sore throat Respiratory: Denies cough, dyspnea or wheezes Cardiovascular: Denies palpitation, chest discomfort or lower extremity swelling Gastrointestinal:  Denies nausea, heartburn or change in bowel habits Skin: Denies abnormal skin rashes Lymphatics: Denies new lymphadenopathy or easy bruising Neurological:Denies numbness, tingling or new weaknesses Behavioral/Psych: Mood is stable, no new changes  All other systems were reviewed with the patient and are negative.  PHYSICAL EXAMINATION: ECOG PERFORMANCE STATUS: 1-2  Filed Vitals:   01/12/15 0855  BP: 103/70  Pulse: 113  Temp: 98.3 F (36.8 C)  Resp: 18   Filed Weights   01/12/15 0855  Weight: 178 lb 9.6 oz (81.012 kg)    GENERAL:alert, no distress and comfortable SKIN: skin color, texture, turgor are normal, no rashes or significant lesions, (+) skin pigmentation nails, no significant nail thickening  EYES: normal, conjunctiva are pink and non-injected, sclera clear OROPHARYNX:no exudate, no erythema and lips,  buccal mucosa, and tongue normal  NECK: supple, thyroid normal size, non-tender, without nodularity LYMPH:  no palpable lymphadenopathy in the cervical, axillary or inguinal LUNGS: clear to auscultation and percussion with normal breathing effort HEART: regular rate & rhythm and no murmurs and no lower extremity edema ABDOMEN:abdomen soft, non-tender and normal bowel sounds Musculoskeletal:no cyanosis of digits and no clubbing  PSYCH: alert & oriented x 3 with fluent speech NEURO: no focal motor/sensory deficits Breasts: Breast inspection showed them to be symmetrical with no nipple discharge. Slight left nipple retraction. A 1.0cm (imnitially 2.5 cm) mass behind the nipple. Palpation of the right breasts and both axillas revealed no obvious mass that I could appreciate.   LABORATORY DATA:  I have reviewed the data as listed CBC Latest Ref Rng 01/12/2015 01/05/2015 12/29/2014  WBC 3.9 - 10.3 10e3/uL 3.4(L) 3.2(L) 2.3(L)  Hemoglobin 11.6 - 15.9 g/dL 10.6(L) 10.7(L) 10.2(L)  Hematocrit 34.8 - 46.6 % 32.1(L) 31.9(L) 30.9(L)  Platelets 145 - 400 10e3/uL 240 262 254    CMP Latest Ref Rng 01/12/2015 01/05/2015 12/29/2014  Glucose 70 - 140 mg/dl 133 95 131  BUN 7.0 - 26.0 mg/dL 15.5 16.1 12.3  Creatinine 0.6 - 1.1 mg/dL 1.3(H) 1.0 1.2(H)  Sodium 136 - 145 mEq/L 140 138 139  Potassium 3.5 - 5.1 mEq/L 3.8 3.9 3.7  Chloride 101 - 111 mmol/L - - -  CO2 22 - 29 mEq/L _0 Calcium 8.4 - 10.4 mg/dL 10.8(H) 10.5(H) 10.5(H)  Total Protein 6.4 - 8.3 g/dL 6.8 7.0 6.8  Total Bilirubin 0.20 - 1.20 mg/dL 0.66 0.59 0.68  Alkaline Phos 40 - 150 U/L 71 70 68  AST 5 - 34 U/L _1 ALT 0 - 55 U/L _2 ANC 1.6 today    PATHOLOGY REPORT:  Diagnosis 08/06/2014 1. Breast, left, needle core biopsy, 9:30 o'clock, 1 CMFN - INVASIVE DUCTAL CARCINOMA, SEE COMMENT. 2. Lymph node, needle/core biopsy, left axillary lymph node - ONE LYMPH NODE, POSITIVE FOR METASTATIC MAMMARY CARCINOMA (1/1) 1. Although  the grade of tumor is best assessed at resection, with these biopsies, the invasive tumor is grade 1. Breast prognostic studies are pending (part 1 only) and will be reported in an addendum. The case is reviewed with Dr. Avis Epley who concurs. (CRR:gt, 08/07/14)  Results: IMMUNOHISTOCHEMICAL AND MORPHOMETRIC ANALYSIS BY THE AUTOMATED CELLULAR IMAGING SYSTEM (ACIS) Estrogen Receptor: 100%,  POSITIVE, STRONG STAINING INTENSITY (PERFORMED MANUALLY) Progesterone Receptor: 5%, POSITIVE, WEAK STAINING INTENSITY Proliferation Marker Ki67: 50% (PERFORMED MANUALLY)  Results: HER2 - NEGATIVE RATIO OF HER2/CEP17 SIGNALS 1.47 AVERAGE HER2 COPY NUMBER PER CELL 2.35  RADIOGRAPHIC STUDIES: I have personally reviewed the radiological images as listed and agreed with the findings in the report.  US Breast Ltd Uni Left Inc Axilla 08/06/2014    FINDINGS: CC and MLO views of the left breast with tomosynthesis and CC and ML magnification views were performed. There is an approximately 2 cm irregular mass within the slightly medial, subareolar left breast. In addition, there are associated coarse heterogeneous calcifications. In total, the mass and calcifications span approximately 5.5 cm. A prominent lymph node is noted within the left axilla. Several adjacent oval, circumscribed low-density masses are noted within the left breast at 12 o'clock, middle depth.  Mammographic images were processed with CAD.  On physical exam, a palpable mass is noted within the left breast at 930, 1 cm from the nipple. No discrete mass is felt in the 12 o'clock region.  Targeted ultrasound is performed, showing an irregular hypoechoic mass at 930, 1 cm from the nipple measuring approximately 15 x 13 x 14 mm, corresponding to the palpable abnormality on exam. Dilated ducts extend from the mass to the level of the nipple, concerning for intraductal extension. The posterior extent of the calcifications seen mammographically is not well seen on  ultrasound. At 12:30, 5 cm from the nipple, there is an oval, hypoechoic, circumscribed mass measuring approximately 8 x 8 x 2 mm which is thought to correspond to the largest of the oval low-density masses seen mammographically at 12 o'clock. No internal vascularity is identified.  Targeted exam of the left axilla demonstrates an abnormal appearing lymph node with cortical thickening measuring up to 5 mm.    IMPRESSION: 1. Highly suspicious left breast mass with associated pleomorphic calcifications which together span approximately 5.5 cm. Sonographic findings are concerning for intraductal extension of carcinoma to the level of the nipple. 2. Indeterminate left axillary lymph node with cortical thickening. 3. Circumscribed, low-density oval masses at 12 o'clock within the left breast.  RECOMMENDATION: 1. Ultrasound-guided core biopsy of the left breast mass at 930 and of the left axillary lymph node with cortical thickening. If biopsy of the left breast mass at 930 demonstrates malignancy, breast MRI is recommended for further evaluation of nipple involvement. 2. The patient's outside prior mammograms will be obtained for comparison. If the low density oval masses seen within the left breast at 12 o'clock are unchanged in appearance, no further evaluation is recommended. An addendum will be made when the comparison films are available.  I have discussed the findings and recommendations with the patient. Results were also provided in writing at the conclusion of the visit. If applicable, a reminder letter will be sent to the patient regarding the next appointment.  BI-RADS CATEGORY  5: Highly suggestive of malignancy.   Electronically Signed   By: Pamelia Hoit M.D.   On: 08/06/2014 12:16   Breast MRI 08/25/2014 Right breast: No mass or abnormal enhancement.  Left breast: There is an irregular mass immediately posterior to the left nipple. Mass demonstrates rapid persistent type enhancement kinetics and  extends to the base of the nipple. The within the mass there is tissue marker clip artifact following recent ultrasound-guided core biopsy. Posterior to the mass there is clumped linear non mass enhancement extending into the lower inner quadrant, suspicious for ductal carcinoma in  situ. This enhancement correlates well with the suspicious microcalcifications seen mammographically. Mass and non mass enhancement measured together are 2.5 x 2.5 x 6.6 (anterior to posterior) cm. No additional sites of concern are identified within the left breast.  Lymph nodes: Enlarged lower left axillary lymph node is identified, associated with tissue marker clip following recent ultrasound-guided core biopsy. Additional normal morphology and left axillary lymph nodes are demonstrated. Right axillary lymph nodes and internal mammary chain are unremarkable in appearance.  Ancillary findings: None.  IMPRESSION: 1. Mass and non mass enhancement in the retroareolar and lower inner quadrant of the left breast. Enhancement from the mass does appear to involve the base of the left nipple. The extent of disease is felt to be well demonstrated mammographically. 2. Enlarged left axillary lymph node, corresponding to known metastatic disease as identified on recent core biopsy. 3. Right breast is negative.   Echo 08/24/2014 Study Conclusions  - Left ventricle: The cavity size was normal. Wall thickness was normal. Systolic function was normal. The estimated ejection fraction was in the range of 55% to 60%. - Left atrium: The atrium was mildly dilated.  Bone scan 08/31/2014 IMPRESSION: 1. Tiny focus of increased uptake on the right at approximately T8. No abnormalities demonstrated on the coronal or sagittal images through the thoracic spine on the previous CT scan. Mild bilateral increased uptake at approximately T4 that is likely degenerative. 2. No abnormal uptake observed elsewhere.  CT  chest, abdomen or pelvis 08/31/2014 IMPRESSION: 2 cm left breast mass, consistent with known primary breast carcinoma. No evidence of metastatic disease within the chest, abdomen, or pelvis.  Small hiatal hernia incidentally noted.    ASSESSMENT & PLAN:  60 year old African-American female, postmenopausal, healthy and fit, who was found to have a left breast mass and a biopsy confirmed node metastasis by screening mammogram.  1. Left breast invasive ductal carcinoma, cT3N1M0, stage IIIA, ER+/PR+/HER2- -I reviewed her mammograms, breast ultrasound and MRI findings and core needle biopsy results. -I reviewed her bone scan and CT scan results with her today. She has a very small uptake at T8 on bone scan, but no correlated changes on the CT scan. She is not symptomatic, I think the likelihood of bone metastasis is very low. CT scan was negative for any distant metastasis. - I recommend neoadjuvant chemotherapy. Giving the large primary tumor, node positive disease, high Ki-67, she has very high risk of cancer recurrence after surgery. -given her ER+/HER2- disease, I recommend dose dense Adriamycin and Cytoxan every 2 weeks for fourth cycle, followed by weekly Taxol for 12 weeks. -Giving her positive lymph nodes, she would likely need adjuvant radiation.  - she is tolerating weekly Taxol very well, will continue  -I'll repeat her breast MRI after she completes adjuvant chemotherapy to evaluate her response to chemotherapy -She would be a candidate for the [employer] [phone] trial, which is a phase 3 sentinel lymph nodes positive after neoadjuvant chemotherapy, randomized to lymph node dissection and radiation versus axillary radiation alone. I discussed the trial option with her, and is strongly encouraged her to participate. -She will see her surgeon Dr. Marlou Starks after her breast MRI.  2. fatigue -secondary to chemo  - much improved since she completed AC.  - I strongly encouraging her to be more  physically active, she is doing much better.  3. Cancer genetics She has very strong family history of breast, colon and pancreatic cancer, all in first-degree, I recommend her to see genetic counselor to ruled out  inheritable breast cancer syndrome.  -her genetic testing result was normal  4. History of non-STEMI in 03/2014 -She had cardiac catheterization, which showed mild diffuse coronary artery disease, but no need intervention.  -She will follow-up with her cardiologist Dr. Burt Knack -We discussed some of the chemotherapy drug may cause heart failure, we'll repeat her echo after chemo, she may need follow-up with Dr. Burt Knack during her chemotherapy.   5. Pancytopenia secondary to chemotherapy -We'll monitor her CBC weekly. No need blood transfusion for now.  Plan - continue weekly Taxol today and weekly (2 more weeks) -I'll see her back in 2 weeks -Breast MRI a few weeks after she completes chemotherapy -I sent a message to Dr. Marlou Starks for her follow-up appointment after breast MRI   All questions were answered. The patient knows to call the clinic with any problems, questions or concerns. I spent 20 minutes counseling the patient face to face. The total time spent in the appointment was 25 minutes and more than 50% was on counseling.     Truitt Merle, MD 01/12/2015 9:39 AM

## 2015-01-12 NOTE — Telephone Encounter (Signed)
Called [facility] to make sure they are aware of the order for a MRI of the Breast. They are aware, and will contact the patient directly.       [facility].

## 2015-01-12 NOTE — Patient Instructions (Signed)
[facility] Discharge Instructions for Patients Receiving Chemotherapy  Today you received the following chemotherapy agents:  Taxol, Carboplatin To help prevent nausea and vomiting after your treatment, we encourage you to take your nausea medication.   If you develop nausea and vomiting that is not controlled by your nausea medication, call the clinic.   BELOW ARE SYMPTOMS THAT SHOULD BE REPORTED IMMEDIATELY:  *FEVER GREATER THAN 100.5 F  *CHILLS WITH OR WITHOUT FEVER  NAUSEA AND VOMITING THAT IS NOT CONTROLLED WITH YOUR NAUSEA MEDICATION  *UNUSUAL SHORTNESS OF BREATH  *UNUSUAL BRUISING OR BLEEDING  TENDERNESS IN MOUTH AND THROAT WITH OR WITHOUT PRESENCE OF ULCERS  *URINARY PROBLEMS  *BOWEL PROBLEMS  UNUSUAL RASH Items with * indicate a potential emergency and should be followed up as soon as possible.  Feel free to call the clinic you have any questions or concerns. The clinic phone number is [phone].  Please show the CHEMO ALERT CARD at check-in to the Emergency Department and triage nurse.   

## 2015-01-12 NOTE — Addendum Note (Signed)
Addended by: Truitt Merle on: 01/12/2015 09:47 AM   Modules accepted: Orders

## 2015-01-14 ENCOUNTER — Telehealth: Payer: Self-pay | Admitting: *Deleted

## 2015-01-14 NOTE — Telephone Encounter (Signed)
Received vm call from pt stating that she is supposed to have an MRI at GI but was told that she needs an authorization for a mammogram also & to make sure that it is covered by [facility]. She can be reached at [phone].  Message to Dr Burr Medico.

## 2015-01-19 ENCOUNTER — Encounter: Payer: Self-pay | Admitting: *Deleted

## 2015-01-19 ENCOUNTER — Other Ambulatory Visit (HOSPITAL_BASED_OUTPATIENT_CLINIC_OR_DEPARTMENT_OTHER): Payer: Federal, State, Local not specified - PPO

## 2015-01-19 ENCOUNTER — Other Ambulatory Visit: Payer: Federal, State, Local not specified - PPO

## 2015-01-19 ENCOUNTER — Ambulatory Visit (HOSPITAL_BASED_OUTPATIENT_CLINIC_OR_DEPARTMENT_OTHER): Payer: Federal, State, Local not specified - PPO

## 2015-01-19 ENCOUNTER — Telehealth: Payer: Self-pay | Admitting: *Deleted

## 2015-01-19 VITALS — BP 92/60 | HR 105 | Temp 98.9°F | Resp 18

## 2015-01-19 DIAGNOSIS — Z5111 Encounter for antineoplastic chemotherapy: Secondary | ICD-10-CM

## 2015-01-19 DIAGNOSIS — C50212 Malignant neoplasm of upper-inner quadrant of left female breast: Secondary | ICD-10-CM

## 2015-01-19 DIAGNOSIS — C773 Secondary and unspecified malignant neoplasm of axilla and upper limb lymph nodes: Secondary | ICD-10-CM

## 2015-01-19 LAB — COMPREHENSIVE METABOLIC PANEL
ALT: 24 U/L (ref 0–55)
AST: 17 U/L (ref 5–34)
Albumin: 3.7 g/dL (ref 3.5–5.0)
Alkaline Phosphatase: 64 U/L (ref 40–150)
Anion Gap: 10 mEq/L (ref 3–11)
BILIRUBIN TOTAL: 0.71 mg/dL (ref 0.20–1.20)
BUN: 18.6 mg/dL (ref 7.0–26.0)
CO2: 25 meq/L (ref 22–29)
CREATININE: 1.3 mg/dL — AB (ref 0.6–1.1)
Calcium: 10.4 mg/dL (ref 8.4–10.4)
Chloride: 102 mEq/L (ref 98–109)
EGFR: 53 mL/min/{1.73_m2} — ABNORMAL LOW (ref >90)
GLUCOSE: 118 mg/dL (ref 70–140)
Potassium: 3.9 mEq/L (ref 3.5–5.1)
SODIUM: 137 meq/L (ref 136–145)
TOTAL PROTEIN: 6.7 g/dL (ref 6.4–8.3)

## 2015-01-19 LAB — CBC WITH DIFFERENTIAL/PLATELET
BASO%: 0.7 % (ref 0.0–2.0)
Basophils Absolute: 0 10*3/uL (ref 0.0–0.1)
EOS ABS: 0 10*3/uL (ref 0.0–0.5)
EOS%: 0.5 % (ref 0.0–7.0)
HCT: 31.5 % — ABNORMAL LOW (ref 34.8–46.6)
HGB: 10.4 g/dL — ABNORMAL LOW (ref 11.6–15.9)
LYMPH%: 34.1 % (ref 14.0–49.7)
MCH: 31.3 pg (ref 25.1–34.0)
MCHC: 32.9 g/dL (ref 31.5–36.0)
MCV: 95.2 fL (ref 79.5–101.0)
MONO#: 0.2 10*3/uL (ref 0.1–0.9)
MONO%: 7.4 % (ref 0.0–14.0)
NEUT%: 57.3 % (ref 38.4–76.8)
NEUTROS ABS: 1.7 10*3/uL (ref 1.5–6.5)
Platelets: 257 10*3/uL (ref 145–400)
RBC: 3.31 10*6/uL — AB (ref 3.70–5.45)
RDW: 17.2 % — AB (ref 11.2–14.5)
WBC: 2.9 10*3/uL — AB (ref 3.9–10.3)
lymph#: 1 10*3/uL (ref 0.9–3.3)

## 2015-01-19 MED ORDER — DIPHENHYDRAMINE HCL 50 MG/ML IJ SOLN
INTRAMUSCULAR | Status: AC
  Filled 2015-01-19: qty 1

## 2015-01-19 MED ORDER — DIPHENHYDRAMINE HCL 50 MG/ML IJ SOLN
25.0000 mg | Freq: Once | INTRAMUSCULAR | Status: AC
  Administered 2015-01-19: 25 mg via INTRAVENOUS

## 2015-01-19 MED ORDER — FAMOTIDINE IN NACL 20-0.9 MG/50ML-% IV SOLN
INTRAVENOUS | Status: AC
  Filled 2015-01-19: qty 50

## 2015-01-19 MED ORDER — FAMOTIDINE IN NACL 20-0.9 MG/50ML-% IV SOLN
20.0000 mg | Freq: Once | INTRAVENOUS | Status: AC
  Administered 2015-01-19: 20 mg via INTRAVENOUS

## 2015-01-19 MED ORDER — PACLITAXEL CHEMO INJECTION 300 MG/50ML
80.0000 mg/m2 | Freq: Once | INTRAVENOUS | Status: AC
  Administered 2015-01-19: 156 mg via INTRAVENOUS
  Filled 2015-01-19: qty 26

## 2015-01-19 MED ORDER — HEPARIN SOD (PORK) LOCK FLUSH 100 UNIT/ML IV SOLN
500.0000 [IU] | Freq: Once | INTRAVENOUS | Status: AC | PRN
  Administered 2015-01-19: 500 [IU]
  Filled 2015-01-19: qty 5

## 2015-01-19 MED ORDER — SODIUM CHLORIDE 0.9 % IJ SOLN
10.0000 mL | INTRAMUSCULAR | Status: DC | PRN
  Administered 2015-01-19: 10 mL
  Filled 2015-01-19: qty 10

## 2015-01-19 MED ORDER — SODIUM CHLORIDE 0.9 % IV SOLN
Freq: Once | INTRAVENOUS | Status: AC
  Administered 2015-01-19: 11:00:00 via INTRAVENOUS
  Filled 2015-01-19: qty 4

## 2015-01-19 MED ORDER — SODIUM CHLORIDE 0.9 % IV SOLN
Freq: Once | INTRAVENOUS | Status: AC
  Administered 2015-01-19: 11:00:00 via INTRAVENOUS

## 2015-01-19 NOTE — Patient Instructions (Signed)
[facility] Discharge Instructions for Patients Receiving Chemotherapy  Today you received the following chemotherapy agents Taxol   To help prevent nausea and vomiting after your treatment, we encourage you to take your nausea medication as directed.   If you develop nausea and vomiting that is not controlled by your nausea medication, call the clinic.   BELOW ARE SYMPTOMS THAT SHOULD BE REPORTED IMMEDIATELY:  *FEVER GREATER THAN 100.5 F  *CHILLS WITH OR WITHOUT FEVER  NAUSEA AND VOMITING THAT IS NOT CONTROLLED WITH YOUR NAUSEA MEDICATION  *UNUSUAL SHORTNESS OF BREATH  *UNUSUAL BRUISING OR BLEEDING  TENDERNESS IN MOUTH AND THROAT WITH OR WITHOUT PRESENCE OF ULCERS  *URINARY PROBLEMS  *BOWEL PROBLEMS  UNUSUAL RASH Items with * indicate a potential emergency and should be followed up as soon as possible.  Feel free to call the clinic you have any questions or concerns. The clinic phone number is [phone].  Please show the CHEMO ALERT CARD at check-in to the Emergency Department and triage nurse.   

## 2015-01-19 NOTE — Telephone Encounter (Signed)
Spoke with pt and informed pt that she does not need mammogram prior to Breast MRI.  Reason for MRI is to evaluate her chemo response ; no need for mammogram - as per Dr. Burr Medico. Nurse noted pt is scheduled for MRI Breast on 02/01/15.  Pt aware and voiced understanding.

## 2015-01-19 NOTE — Progress Notes (Signed)
This encounter was created in error - please disregard.

## 2015-01-19 NOTE — Progress Notes (Signed)
Reviewed labs and VS with Dr. Benay Spice. Okay to proceed with treatment, no new orders at this time.

## 2015-01-26 ENCOUNTER — Ambulatory Visit (HOSPITAL_BASED_OUTPATIENT_CLINIC_OR_DEPARTMENT_OTHER): Payer: Federal, State, Local not specified - PPO | Admitting: Hematology

## 2015-01-26 ENCOUNTER — Other Ambulatory Visit (HOSPITAL_BASED_OUTPATIENT_CLINIC_OR_DEPARTMENT_OTHER): Payer: Federal, State, Local not specified - PPO

## 2015-01-26 ENCOUNTER — Encounter: Payer: Self-pay | Admitting: Hematology

## 2015-01-26 ENCOUNTER — Ambulatory Visit (HOSPITAL_BASED_OUTPATIENT_CLINIC_OR_DEPARTMENT_OTHER): Payer: Federal, State, Local not specified - PPO

## 2015-01-26 ENCOUNTER — Encounter: Payer: Self-pay | Admitting: *Deleted

## 2015-01-26 ENCOUNTER — Telehealth: Payer: Self-pay | Admitting: Hematology

## 2015-01-26 VITALS — BP 99/62 | HR 107 | Temp 98.4°F | Resp 18 | Ht 67.0 in | Wt 174.4 lb

## 2015-01-26 DIAGNOSIS — C773 Secondary and unspecified malignant neoplasm of axilla and upper limb lymph nodes: Secondary | ICD-10-CM

## 2015-01-26 DIAGNOSIS — C50212 Malignant neoplasm of upper-inner quadrant of left female breast: Secondary | ICD-10-CM

## 2015-01-26 DIAGNOSIS — R5383 Other fatigue: Secondary | ICD-10-CM | POA: Diagnosis not present

## 2015-01-26 DIAGNOSIS — Z5111 Encounter for antineoplastic chemotherapy: Secondary | ICD-10-CM

## 2015-01-26 DIAGNOSIS — D61811 Other drug-induced pancytopenia: Secondary | ICD-10-CM | POA: Diagnosis not present

## 2015-01-26 LAB — CBC WITH DIFFERENTIAL/PLATELET
BASO%: 0.6 % (ref 0.0–2.0)
BASOS ABS: 0 10*3/uL (ref 0.0–0.1)
EOS%: 0.4 % (ref 0.0–7.0)
Eosinophils Absolute: 0 10*3/uL (ref 0.0–0.5)
HCT: 32.6 % — ABNORMAL LOW (ref 34.8–46.6)
HGB: 10.9 g/dL — ABNORMAL LOW (ref 11.6–15.9)
LYMPH%: 33.3 % (ref 14.0–49.7)
MCH: 31.3 pg (ref 25.1–34.0)
MCHC: 33.5 g/dL (ref 31.5–36.0)
MCV: 93.7 fL (ref 79.5–101.0)
MONO#: 0.2 10*3/uL (ref 0.1–0.9)
MONO%: 6.3 % (ref 0.0–14.0)
NEUT#: 2.1 10*3/uL (ref 1.5–6.5)
NEUT%: 59.4 % (ref 38.4–76.8)
Platelets: 276 10*3/uL (ref 145–400)
RBC: 3.48 10*6/uL — ABNORMAL LOW (ref 3.70–5.45)
RDW: 17.2 % — AB (ref 11.2–14.5)
WBC: 3.5 10*3/uL — ABNORMAL LOW (ref 3.9–10.3)
lymph#: 1.2 10*3/uL (ref 0.9–3.3)

## 2015-01-26 LAB — COMPREHENSIVE METABOLIC PANEL
ALT: 23 U/L (ref 0–55)
AST: 17 U/L (ref 5–34)
Albumin: 3.9 g/dL (ref 3.5–5.0)
Alkaline Phosphatase: 70 U/L (ref 40–150)
Anion Gap: 11 mEq/L (ref 3–11)
BUN: 14.8 mg/dL (ref 7.0–26.0)
CHLORIDE: 102 meq/L (ref 98–109)
CO2: 24 mEq/L (ref 22–29)
Calcium: 10.7 mg/dL — ABNORMAL HIGH (ref 8.4–10.4)
Creatinine: 1.3 mg/dL — ABNORMAL HIGH (ref 0.6–1.1)
EGFR: 53 mL/min/{1.73_m2} — ABNORMAL LOW (ref >90)
GLUCOSE: 130 mg/dL (ref 70–140)
POTASSIUM: 3.6 meq/L (ref 3.5–5.1)
SODIUM: 137 meq/L (ref 136–145)
Total Bilirubin: 0.76 mg/dL (ref 0.20–1.20)
Total Protein: 6.9 g/dL (ref 6.4–8.3)

## 2015-01-26 MED ORDER — PACLITAXEL CHEMO INJECTION 300 MG/50ML
80.0000 mg/m2 | Freq: Once | INTRAVENOUS | Status: AC
  Administered 2015-01-26: 156 mg via INTRAVENOUS
  Filled 2015-01-26: qty 26

## 2015-01-26 MED ORDER — FAMOTIDINE IN NACL 20-0.9 MG/50ML-% IV SOLN
20.0000 mg | Freq: Once | INTRAVENOUS | Status: AC
  Administered 2015-01-26: 20 mg via INTRAVENOUS

## 2015-01-26 MED ORDER — DIPHENHYDRAMINE HCL 50 MG/ML IJ SOLN
25.0000 mg | Freq: Once | INTRAMUSCULAR | Status: AC
  Administered 2015-01-26: 25 mg via INTRAVENOUS

## 2015-01-26 MED ORDER — HEPARIN SOD (PORK) LOCK FLUSH 100 UNIT/ML IV SOLN
500.0000 [IU] | Freq: Once | INTRAVENOUS | Status: AC | PRN
  Administered 2015-01-26: 500 [IU]
  Filled 2015-01-26: qty 5

## 2015-01-26 MED ORDER — SODIUM CHLORIDE 0.9 % IV SOLN
Freq: Once | INTRAVENOUS | Status: AC
  Administered 2015-01-26: 11:00:00 via INTRAVENOUS
  Filled 2015-01-26: qty 4

## 2015-01-26 MED ORDER — SODIUM CHLORIDE 0.9 % IJ SOLN
10.0000 mL | INTRAMUSCULAR | Status: DC | PRN
  Administered 2015-01-26: 10 mL
  Filled 2015-01-26: qty 10

## 2015-01-26 MED ORDER — FAMOTIDINE IN NACL 20-0.9 MG/50ML-% IV SOLN
INTRAVENOUS | Status: AC
  Filled 2015-01-26: qty 50

## 2015-01-26 MED ORDER — SODIUM CHLORIDE 0.9 % IV SOLN
Freq: Once | INTRAVENOUS | Status: AC
  Administered 2015-01-26: 11:00:00 via INTRAVENOUS

## 2015-01-26 MED ORDER — DIPHENHYDRAMINE HCL 50 MG/ML IJ SOLN
INTRAMUSCULAR | Status: AC
  Filled 2015-01-26: qty 1

## 2015-01-26 NOTE — Progress Notes (Signed)
[facility]  [phone] [phone]  Clinic follow up Note   Patient Care Team: Katherina Mires, MD as PCP - General (Family Medicine) Autumn Messing III, MD as Consulting Physician (General Surgery) Sherren Mocha, MD as Consulting Physician (Cardiology) 01/26/2015  CHIEF COMPLAINTS:  Follow up left breast cancer   Oncology History   Breast cancer of upper-inner quadrant of left female breast   Staging form: Breast, AJCC 7th Edition     Clinical: Stage IIIA (T3, N1, M0) - Unsigned       Breast cancer of upper-inner quadrant of left female breast ([facility])   08/06/2014 Initial Diagnosis Breast cancer of upper-inner quadrant of left female breast   08/06/2014 Initial Biopsy Left breast 9:30 o'clock position showed invasive ductal carcinoma, and 1 left axillary lymph node biopsy showed positive for metastatic carcinoma.   08/06/2014 Receptors her2 ER 100% positive, PR 5% positive, HER2-, Ki-67 50%   08/06/2014 Mammogram Diagnostic mammogram and ultrasound showed left breast mass with associated calcification measuring 5.5 cm, indeterminate 8 left axillary lymph node was cortical thickening, circumscribed low density oval 36m mass at 12:00 within the left breast.   09/03/2014 -  Adjuvant Chemotherapy ddAC every 2 weeks X4, followed by weekly Taxol X12     HISTORY OF PRESENTING ILLNESS:  Mary Grill89y.o. female is here because of recently newly diagnosed left breast cancer. She presents to the clinic with her daughter.  This was discovered by screening mammogram. Her prior mammogram was 2 years ago. She noticed mild left nipple retraction, no palpable mass, no other changes. She feels well overall, denies any significant pain except mild right knee pain from arthritis. She denies any change of her appetite, or recent weight loss. 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   Her family history is notable for breast cancer in her brother,  pancreatic cancer in another brother, and colon cancer in her mother. She is postmenopausal, last menstrual period 8 years ago. She has a daughter and a son. She lives with her brother, and her daughter is likely going to stay with her.  CURRENT TREATMENT: weekly  Paclitaxel 80 mg/m, started on 11/10/2014, plan for total of 12 weeks  [place]ax[place] for follow up and last treatment of her chemo. She has moderate fatigue and low appetite, which are her main complains. She eats small meals, most soft foods. She has lost about 10 pounds in the past 2 months. She also reports intermittent back pain, and right side muscle pain and mild weakness. She is able to walk independently, gait is stable. She came in with a wheelchair today. No fever or chills, no other new complaints.  MEDICAL HISTORY:  Past Medical History  Diagnosis Date  . Hypertension   . Angina effort ([facility]   . NSTEMI (non-ST elevated myocardial infarction) ([facility] 03/16/2014    non-obs CAD, spasm seen  . Pneumonia 2009  . Arthritis     "right knee" (03/16/2014)  . 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 Breast cancer ([facility] 2016  . Family history of breast cancer   . Family history of pancreatic cancer   . Family history of breast cancer in female     SURGICAL HISTORY: Past Surgical History  Procedure Laterality Date  . Other surgical history  1991    Tubal pregnancy  . Cardiac catheterization  2010; 03/16/2014  . Tubal ligation  1981  .  Left heart catheterization with coronary angiogram N/A 03/16/2014    Procedure: LEFT HEART CATHETERIZATION WITH CORONARY ANGIOGRAM;  Surgeon: Sherren Mocha, MD; Non-obs CAD, EF 55%  . Bunionectomy Bilateral   . Breast biopsy Left 08/06/14  . Portacath placement N/A 08/28/2014    Procedure: INSERTION PORT-A-CATH;  Surgeon: Autumn Messing III, MD;  Location: [facility];  Service: General;  Laterality: N/A;   GYN HISTORY  Menarchal: 15 LMP: 51 Contraceptive:  4 HRT: no G2P2: no breast feeding     SOCIAL HISTORY: Social History   Social History  . Marital Status: Legally Separated    Spouse Name: N/A  . Number of Children: Daughter 22, son 37   . Years of Education: N/A   Occupational History  . Works for post office    Social History Main Topics  . Smoking status: Never Smoker   . Smokeless tobacco: Never Used  . Alcohol Use: No  . Drug Use: No  . Sexual Activity: No   Other Topics Concern  . Not on file   Social History Narrative    FAMILY HISTORY: Family History  Problem Relation Age of Onset  . Pancreatic cancer Brother 15  . Cancer Brother 40    breast cancer   . Cancer Mother 25    colon cancer   . Hypertension Mother   . Diabetes type II Mother   . CVA Father   . Healthy Sister   . Healthy Sister     ALLERGIES:  is allergic to codeine; carvedilol; compazine; percocet; and adhesive.  MEDICATIONS:  Current Outpatient Prescriptions  Medication Sig Dispense Refill  . acyclovir (ZOVIRAX) 400 MG tablet Take 1 tablet (400 mg total) by mouth 3 (three) times daily. 30 tablet 1  . amLODipine (NORVASC) 10 MG tablet Take 1 tablet (10 mg total) by mouth daily. 30 tablet 3  . aprepitant (EMEND) 80 MG capsule Take 1 capsule (80 mg total) by mouth daily. Start the day after chemotherapy. 3 capsule 1  . atorvastatin (LIPITOR) 40 MG tablet Take 1 tablet (40 mg total) by mouth daily at 6 PM. 30 tablet 11  . dexamethasone (DECADRON) 4 MG tablet Take 2 pills once the day after chemotherapy and then 2 times a day for 2 days. Take with food 30 tablet 1  . hydrochlorothiazide (HYDRODIURIL) 25 MG tablet Take 25 mg by mouth daily.    . irbesartan (AVAPRO) 300 MG tablet Take 1 tablet (300 mg total) by mouth daily. 30 tablet 2  . isosorbide mononitrate (IMDUR) 30 MG 24 hr tablet Take 1 tablet (30 mg total) by mouth daily. 30 tablet 1  . Lidocaine-Prilocaine, Bulk, 2.5-2.5 % CREA Apply 5 mLs topically as needed. 1.5 hours before use  with chemotherapy.Cover with plastic 30 g 3  . LORazepam (ATIVAN) 0.5 MG tablet Take 1 tablet by mouth every 6 hours as needed for nausea or vomiting. 30 tablet 1  . nitroGLYCERIN (NITROSTAT) 0.4 MG SL tablet Place 1 tablet (0.4 mg total) under the tongue every 5 (five) minutes as needed for chest pain. 25 tablet 12  . ondansetron (ZOFRAN) 8 MG tablet Take 1 tablet (8 mg total) by mouth 2 (two) times daily as needed for nausea or vomiting. Start on 3rd day after Chemotherapy 30 tablet 1  . potassium chloride (K-DUR) 10 MEQ tablet Take 1 tablet (10 mEq total) by mouth daily. 30 tablet 11  . promethazine (PHENERGAN) 12.5 MG tablet Take 1-2 tablets (12.5-25 mg total) by mouth every 6 (six)  hours as needed for nausea or vomiting. 40 tablet 2   No current facility-administered medications for this visit.    REVIEW OF SYSTEMS:   Constitutional: Denies fevers, chills or abnormal night sweats Eyes: Denies blurriness of vision, double vision or watery eyes Ears, nose, mouth, throat, and face: Denies mucositis or sore throat Respiratory: Denies cough, dyspnea or wheezes Cardiovascular: Denies palpitation, chest discomfort or lower extremity swelling Gastrointestinal:  Denies nausea, heartburn or change in bowel habits Skin: Denies abnormal skin rashes Lymphatics: Denies new lymphadenopathy or easy bruising Neurological:Denies numbness, tingling or new weaknesses Behavioral/Psych: Mood is stable, no new changes  All other systems were reviewed with the patient and are negative.  PHYSICAL EXAMINATION: ECOG PERFORMANCE STATUS: 2  Filed Vitals:   01/26/15 0913  BP: 99/62  Pulse: 107  Temp: 98.4 F (36.9 C)  Resp: 18   Filed Weights   01/26/15 0913  Weight: 174 lb 6.4 oz (79.107 kg)    GENERAL:alert, no distress and comfortable SKIN: skin color, texture, turgor are normal, no rashes or significant lesions, (+) skin pigmentation nails, no significant nail thickening  EYES: normal,  conjunctiva are pink and non-injected, sclera clear OROPHARYNX:no exudate, no erythema and lips, buccal mucosa, and tongue normal  NECK: supple, thyroid normal size, non-tender, without nodularity LYMPH:  no palpable lymphadenopathy in the cervical, axillary or inguinal LUNGS: clear to auscultation and percussion with normal breathing effort HEART: regular rate & rhythm and no murmurs and no lower extremity edema ABDOMEN:abdomen soft, non-tender and normal bowel sounds Musculoskeletal:no cyanosis of digits and no clubbing  PSYCH: alert & oriented x 3 with fluent speech NEURO: no focal motor/sensory deficits Breasts: Breast inspection showed them to be symmetrical with no nipple discharge. Slight left nipple retraction. A 1.0cm (imnitially 2.5 cm) mass behind the nipple. Palpation of the right breasts and both axillas revealed no obvious mass that I could appreciate.   LABORATORY DATA:  I have reviewed the data as listed CBC Latest Ref Rng 01/26/2015 01/19/2015 01/12/2015  WBC 3.9 - 10.3 10e3/uL 3.5(L) 2.9(L) 3.4(L)  Hemoglobin 11.6 - 15.9 g/dL 10.9(L) 10.4(L) 10.6(L)  Hematocrit 34.8 - 46.6 % 32.6(L) 31.5(L) 32.1(L)  Platelets 145 - 400 10e3/uL 276 257 240    CMP Latest Ref Rng 01/19/2015 01/12/2015 01/05/2015  Glucose 70 - 140 mg/dl 118 133 95  BUN 7.0 - 26.0 mg/dL 18.6 15.5 16.1  Creatinine 0.6 - 1.1 mg/dL 1.3(H) 1.3(H) 1.0  Sodium 136 - 145 mEq/L 137 140 138  Potassium 3.5 - 5.1 mEq/L 3.9 3.8 3.9  Chloride 101 - 111 mmol/L - - -  CO2 22 - 29 mEq/L _0 Calcium 8.4 - 10.4 mg/dL 10.4 10.8(H) 10.5(H)  Total Protein 6.4 - 8.3 g/dL 6.7 6.8 7.0  Total Bilirubin 0.20 - 1.20 mg/dL 0.71 0.66 0.59  Alkaline Phos 40 - 150 U/L 64 71 70  AST 5 - 34 U/L _1 ALT 0 - 55 U/L _2 ANC 2.1 today    PATHOLOGY REPORT:  Diagnosis 08/06/2014 1. Breast, left, needle core biopsy, 9:30 o'clock, 1 CMFN - INVASIVE DUCTAL CARCINOMA, SEE COMMENT. 2. Lymph node, needle/core biopsy, left  axillary lymph node - ONE LYMPH NODE, POSITIVE FOR METASTATIC MAMMARY CARCINOMA (1/1) 1. Although the grade of tumor is best assessed at resection, with these biopsies, the invasive tumor is grade 1. Breast prognostic studies are pending (part 1 only) and will be reported in an addendum. The case is reviewed with  Dr. Avis Epley who concurs. (CRR:gt, 08/07/14)  Results: IMMUNOHISTOCHEMICAL AND MORPHOMETRIC ANALYSIS BY THE AUTOMATED CELLULAR IMAGING SYSTEM (ACIS) Estrogen Receptor: 100%, POSITIVE, STRONG STAINING INTENSITY (PERFORMED MANUALLY) Progesterone Receptor: 5%, POSITIVE, WEAK STAINING INTENSITY Proliferation Marker Ki67: 50% (PERFORMED MANUALLY)  Results: HER2 - NEGATIVE RATIO OF HER2/CEP17 SIGNALS 1.47 AVERAGE HER2 COPY NUMBER PER CELL 2.35  RADIOGRAPHIC STUDIES: I have personally reviewed the radiological images as listed and agreed with the findings in the report.  US Breast Ltd Uni Left Inc Axilla 08/06/2014    FINDINGS: CC and MLO views of the left breast with tomosynthesis and CC and ML magnification views were performed. There is an approximately 2 cm irregular mass within the slightly medial, subareolar left breast. In addition, there are associated coarse heterogeneous calcifications. In total, the mass and calcifications span approximately 5.5 cm. A prominent lymph node is noted within the left axilla. Several adjacent oval, circumscribed low-density masses are noted within the left breast at 12 o'clock, middle depth.  Mammographic images were processed with CAD.  On physical exam, a palpable mass is noted within the left breast at 930, 1 cm from the nipple. No discrete mass is felt in the 12 o'clock region.  Targeted ultrasound is performed, showing an irregular hypoechoic mass at 930, 1 cm from the nipple measuring approximately 15 x 13 x 14 mm, corresponding to the palpable abnormality on exam. Dilated ducts extend from the mass to the level of the nipple, concerning for intraductal  extension. The posterior extent of the calcifications seen mammographically is not well seen on ultrasound. At 12:30, 5 cm from the nipple, there is an oval, hypoechoic, circumscribed mass measuring approximately 8 x 8 x 2 mm which is thought to correspond to the largest of the oval low-density masses seen mammographically at 12 o'clock. No internal vascularity is identified.  Targeted exam of the left axilla demonstrates an abnormal appearing lymph node with cortical thickening measuring up to 5 mm.    IMPRESSION: 1. Highly suspicious left breast mass with associated pleomorphic calcifications which together span approximately 5.5 cm. Sonographic findings are concerning for intraductal extension of carcinoma to the level of the nipple. 2. Indeterminate left axillary lymph node with cortical thickening. 3. Circumscribed, low-density oval masses at 12 o'clock within the left breast.  RECOMMENDATION: 1. Ultrasound-guided core biopsy of the left breast mass at 930 and of the left axillary lymph node with cortical thickening. If biopsy of the left breast mass at 930 demonstrates malignancy, breast MRI is recommended for further evaluation of nipple involvement. 2. The patient's outside prior mammograms will be obtained for comparison. If the low density oval masses seen within the left breast at 12 o'clock are unchanged in appearance, no further evaluation is recommended. An addendum will be made when the comparison films are available.  I have discussed the findings and recommendations with the patient. Results were also provided in writing at the conclusion of the visit. If applicable, a reminder letter will be sent to the patient regarding the next appointment.  BI-RADS CATEGORY  5: Highly suggestive of malignancy.   Electronically Signed   By: Pamelia Hoit M.D.   On: 08/06/2014 12:16   Breast MRI 08/25/2014 Right breast: No mass or abnormal enhancement.  Left breast: There is an irregular mass immediately  posterior to the left nipple. Mass demonstrates rapid persistent type enhancement kinetics and extends to the base of the nipple. The within the mass there is tissue marker clip artifact following recent ultrasound-guided core biopsy.  Posterior to the mass there is clumped linear non mass enhancement extending into the lower inner quadrant, suspicious for ductal carcinoma in situ. This enhancement correlates well with the suspicious microcalcifications seen mammographically. Mass and non mass enhancement measured together are 2.5 x 2.5 x 6.6 (anterior to posterior) cm. No additional sites of concern are identified within the left breast.  Lymph nodes: Enlarged lower left axillary lymph node is identified, associated with tissue marker clip following recent ultrasound-guided core biopsy. Additional normal morphology and left axillary lymph nodes are demonstrated. Right axillary lymph nodes and internal mammary chain are unremarkable in appearance.  Ancillary findings: None.  IMPRESSION: 1. Mass and non mass enhancement in the retroareolar and lower inner quadrant of the left breast. Enhancement from the mass does appear to involve the base of the left nipple. The extent of disease is felt to be well demonstrated mammographically. 2. Enlarged left axillary lymph node, corresponding to known metastatic disease as identified on recent core biopsy. 3. Right breast is negative.   Echo 08/24/2014 Study Conclusions  - Left ventricle: The cavity size was normal. Wall thickness was normal. Systolic function was normal. The estimated ejection fraction was in the range of 55% to 60%. - Left atrium: The atrium was mildly dilated.  Bone scan 08/31/2014 IMPRESSION: 1. Tiny focus of increased uptake on the right at approximately T8. No abnormalities demonstrated on the coronal or sagittal images through the thoracic spine on the previous CT scan. Mild bilateral increased uptake at  approximately T4 that is likely degenerative. 2. No abnormal uptake observed elsewhere.  CT chest, abdomen or pelvis 08/31/2014 IMPRESSION: 2 cm left breast mass, consistent with known primary breast carcinoma. No evidence of metastatic disease within the chest, abdomen, or pelvis.  Small hiatal hernia incidentally noted.    ASSESSMENT & PLAN:  60 year old African-American female, postmenopausal, healthy and fit, who was found to have a left breast mass and a biopsy confirmed node metastasis by screening mammogram.  1. Left breast invasive ductal carcinoma, cT3N1M0, stage IIIA, ER+/PR+/HER2- -I reviewed her mammograms, breast ultrasound and MRI findings and core needle biopsy results. -I reviewed her bone scan and CT scan results with her today. She has a very small uptake at T8 on bone scan, but no correlated changes on the CT scan. She is not symptomatic, I think the likelihood of bone metastasis is very low. CT scan was negative for any distant metastasis. - I recommend neoadjuvant chemotherapy. Giving the large primary tumor, node positive disease, high Ki-67, she has very high risk of cancer recurrence after surgery. -given her ER+/HER2- disease, I recommend dose dense Adriamycin and Cytoxan every 2 weeks for fourth cycle, followed by weekly Taxol for 12 weeks, she is finishing chemo today. She overall tolerated moderately well. -Giving her positive lymph nodes, she would likely need adjuvant radiation.  -She is scheduled to have a repeated breast MRI next week to evaluate her response to chemotherapy -She would be a candidate for the [employer] [phone] trial, which is a phase 3 sentinel lymph nodes positive after neoadjuvant chemotherapy, randomized to lymph node dissection and radiation versus axillary radiation alone. She has met our research nurse, the consent form was given to her for review. She declined this study due to the concern of randomization. -She will see her surgeon Dr.  Marlou Starks on 2/6 -I introduced the clinical trial PALLAS, which is a randomized phase 3 study of adjuvant endocrine therapy with or without Ibrance in face to and 3 hormonal receptor  positive and HER-2 negative breast cancer. She will think about it, we'll discuss further after she completes adjuvant radiation.  2. Fatigue, anorexia and weight loss  -secondary to chemo  -stable lately, I encouraged her to take nutritional supplement. - I strongly encouraging her to be more physically active -She will likely recover after she completes chemotherapy today  3. Cancer genetics She has very strong family history of breast, colon and pancreatic cancer, all in first-degree, I recommend her to see genetic counselor to ruled out inheritable breast cancer syndrome.  -her genetic testing result was normal  4. History of non-STEMI in 03/2014 -She had cardiac catheterization, which showed mild diffuse coronary artery disease, but no need intervention.  -She will follow-up with her cardiologist Dr. Burt Knack -We discussed some of the chemotherapy drug may cause heart failure, we'll repeat her echo after chemo, she may need follow-up with Dr. Burt Knack during her chemotherapy.   5. Pancytopenia secondary to chemotherapy -overall stable -We'll monitor her CBC weekly. No need blood transfusion for now.  Plan - continue weekly Taxol today (last treatment) -Breast MRI next week -I'll see her back on 2/10  All questions were answered. The patient knows to call the clinic with any problems, questions or concerns. I spent 20 minutes counseling the patient face to face. The total time spent in the appointment was 25 minutes and more than 50% was on counseling.     Truitt Merle, MD 01/26/2015 9:36 AM

## 2015-01-26 NOTE — Patient Instructions (Signed)
[facility] Discharge Instructions for Patients Receiving Chemotherapy  Today you received the following chemotherapy agents: Taxol  To help prevent nausea and vomiting after your treatment, we encourage you to take your nausea medication Zofran every 8 hours as directed.    If you develop nausea and vomiting that is not controlled by your nausea medication, call the clinic.   BELOW ARE SYMPTOMS THAT SHOULD BE REPORTED IMMEDIATELY:  *FEVER GREATER THAN 100.5 F  *CHILLS WITH OR WITHOUT FEVER  NAUSEA AND VOMITING THAT IS NOT CONTROLLED WITH YOUR NAUSEA MEDICATION  *UNUSUAL SHORTNESS OF BREATH  *UNUSUAL BRUISING OR BLEEDING  TENDERNESS IN MOUTH AND THROAT WITH OR WITHOUT PRESENCE OF ULCERS  *URINARY PROBLEMS  *BOWEL PROBLEMS  UNUSUAL RASH Items with * indicate a potential emergency and should be followed up as soon as possible.  Feel free to call the clinic if you have any questions or concerns. The clinic phone number is [phone].  Please show the [facility] at check-in to the Emergency Department and triage nurse.

## 2015-01-26 NOTE — Telephone Encounter (Signed)
per pof to sch pt appt-gave pt copy of avs °

## 2015-01-27 ENCOUNTER — Other Ambulatory Visit: Payer: Self-pay | Admitting: Cardiovascular Disease

## 2015-02-01 ENCOUNTER — Ambulatory Visit
Admission: RE | Admit: 2015-02-01 | Discharge: 2015-02-01 | Disposition: A | Discharge status: Home / Self Care | Payer: Federal, State, Local not specified - PPO | Source: Ambulatory Visit | Attending: Hematology | Admitting: Hematology

## 2015-02-01 DIAGNOSIS — C50212 Malignant neoplasm of upper-inner quadrant of left female breast: Secondary | ICD-10-CM

## 2015-02-01 MED ORDER — GADOBENATE DIMEGLUMINE 529 MG/ML IV SOLN
16.0000 mL | Freq: Once | INTRAVENOUS | Status: AC | PRN
  Administered 2015-02-01: 16 mL via INTRAVENOUS

## 2015-02-02 ENCOUNTER — Ambulatory Visit: Payer: Federal, State, Local not specified - PPO

## 2015-02-09 ENCOUNTER — Other Ambulatory Visit: Payer: Self-pay | Admitting: General Surgery

## 2015-02-09 DIAGNOSIS — C50912 Malignant neoplasm of unspecified site of left female breast: Secondary | ICD-10-CM

## 2015-02-12 ENCOUNTER — Encounter: Payer: Self-pay | Admitting: Hematology

## 2015-02-12 ENCOUNTER — Ambulatory Visit (HOSPITAL_BASED_OUTPATIENT_CLINIC_OR_DEPARTMENT_OTHER): Payer: Federal, State, Local not specified - PPO | Admitting: Hematology

## 2015-02-12 ENCOUNTER — Telehealth: Payer: Self-pay | Admitting: Hematology

## 2015-02-12 ENCOUNTER — Other Ambulatory Visit (HOSPITAL_BASED_OUTPATIENT_CLINIC_OR_DEPARTMENT_OTHER): Payer: Federal, State, Local not specified - PPO

## 2015-02-12 VITALS — BP 108/70 | HR 106 | Temp 98.9°F | Resp 18 | Ht 67.0 in | Wt 179.3 lb

## 2015-02-12 DIAGNOSIS — G620 Drug-induced polyneuropathy: Secondary | ICD-10-CM | POA: Diagnosis not present

## 2015-02-12 DIAGNOSIS — D61810 Antineoplastic chemotherapy induced pancytopenia: Secondary | ICD-10-CM | POA: Diagnosis not present

## 2015-02-12 DIAGNOSIS — T451X5A Adverse effect of antineoplastic and immunosuppressive drugs, initial encounter: Secondary | ICD-10-CM

## 2015-02-12 DIAGNOSIS — C50212 Malignant neoplasm of upper-inner quadrant of left female breast: Secondary | ICD-10-CM

## 2015-02-12 LAB — CBC WITH DIFFERENTIAL/PLATELET
BASO%: 0.4 % (ref 0.0–2.0)
Basophils Absolute: 0 10*3/uL (ref 0.0–0.1)
EOS ABS: 0 10*3/uL (ref 0.0–0.5)
EOS%: 0.8 % (ref 0.0–7.0)
HCT: 32.4 % — ABNORMAL LOW (ref 34.8–46.6)
HGB: 10.4 g/dL — ABNORMAL LOW (ref 11.6–15.9)
LYMPH%: 29.8 % (ref 14.0–49.7)
MCH: 30.5 pg (ref 25.1–34.0)
MCHC: 32.2 g/dL (ref 31.5–36.0)
MCV: 94.7 fL (ref 79.5–101.0)
MONO#: 0.5 10*3/uL (ref 0.1–0.9)
MONO%: 12.8 % (ref 0.0–14.0)
NEUT%: 56.2 % (ref 38.4–76.8)
NEUTROS ABS: 2.4 10*3/uL (ref 1.5–6.5)
PLATELETS: 226 10*3/uL (ref 145–400)
RBC: 3.43 10*6/uL — AB (ref 3.70–5.45)
RDW: 16.8 % — AB (ref 11.2–14.5)
WBC: 4.3 10*3/uL (ref 3.9–10.3)
lymph#: 1.3 10*3/uL (ref 0.9–3.3)

## 2015-02-12 LAB — COMPREHENSIVE METABOLIC PANEL
ALBUMIN: 3.5 g/dL (ref 3.5–5.0)
ALK PHOS: 71 U/L (ref 40–150)
ALT: 16 U/L (ref 0–55)
AST: 15 U/L (ref 5–34)
Anion Gap: 11 mEq/L (ref 3–11)
BILIRUBIN TOTAL: 0.44 mg/dL (ref 0.20–1.20)
BUN: 13.3 mg/dL (ref 7.0–26.0)
CALCIUM: 10.6 mg/dL — AB (ref 8.4–10.4)
CO2: 25 mEq/L (ref 22–29)
CREATININE: 1 mg/dL (ref 0.6–1.1)
Chloride: 105 mEq/L (ref 98–109)
EGFR: 69 mL/min/{1.73_m2} — ABNORMAL LOW (ref >90)
Glucose: 116 mg/dl (ref 70–140)
POTASSIUM: 3.9 meq/L (ref 3.5–5.1)
SODIUM: 141 meq/L (ref 136–145)
Total Protein: 6.3 g/dL — ABNORMAL LOW (ref 6.4–8.3)

## 2015-02-12 MED ORDER — GABAPENTIN 100 MG PO CAPS: 100.0000 mg | ORAL_CAPSULE | Freq: Three times a day (TID) | ORAL | Status: DC

## 2015-02-12 NOTE — Telephone Encounter (Signed)
per pof to sch pt appt-gave pt opy of avs °

## 2015-02-12 NOTE — Telephone Encounter (Signed)
adv pt PT will calll to sch appt

## 2015-02-12 NOTE — Progress Notes (Signed)
[place]  [phone] [phone]  Clinic follow up Note   Patient Care Team: Katherina Mires, MD as PCP - General (Family Medicine) Autumn Messing III, MD as Consulting Physician (General Surgery) Sherren Mocha, MD as Consulting Physician (Cardiology)  02/12/2015  CHIEF COMPLAINTS:  Follow up left breast cancer   Oncology History   Breast cancer of upper-inner quadrant of left female breast   Staging form: Breast, AJCC 7th Edition     Clinical: Stage IIIA (T3, N1, M0) - Unsigned       Breast cancer of upper-inner quadrant of left female breast ([place])   08/06/2014 Initial Diagnosis Breast cancer of upper-inner quadrant of left female breast   08/06/2014 Initial Biopsy Left breast 9:30 o'clock position showed invasive ductal carcinoma, and 1 left axillary lymph node biopsy showed positive for metastatic carcinoma.   08/06/2014 Receptors her2 ER 100% positive, PR 5% positive, HER2-, Ki-67 50%   08/06/2014 Mammogram Diagnostic mammogram and ultrasound showed left breast mass with associated calcification measuring 5.5 cm, indeterminate 8 left axillary lymph node was cortical thickening, circumscribed low density oval 62m mass at 12:00 within the left breast.   09/03/2014 - 01/26/2015 Adjuvant Chemotherapy ddAC every 2 weeks X4, followed by weekly Taxol X12     HISTORY OF PRESENTING ILLNESS:  Mary Grill60y.o. female is here because of recently newly diagnosed left breast cancer. She presents to the clinic with her daughter.  This was discovered by screening mammogram. Her prior mammogram was 2 years ago. She noticed mild left nipple retraction, no palpable mass, no other changes. She feels well overall, denies any significant pain except mild right knee pain from arthritis. She denies any change of her appetite, or recent weight loss. 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   Her family history is notable for breast cancer in her  brother, pancreatic cancer in another brother, and colon cancer in her mother. She is postmenopausal, last menstrual period 8 years ago. She has a daughter and a son. She lives with her brother, and her daughter is likely going to stay with her.  CURRENT TREATMENT:  Pending surgery   INTERIM HISTORY KGoldiareturns for follow up. She completed neoadjuvant chemotherapy 2-3 weeks ago, still quite fatigued, complains about tingling, numbness and pain on her fingers and toes, she rates the pain level at 5-6 out of 10, her hand function is normal. Her appetite has improved some, she is eating well. She still spent most of time in chair or bed, able to take over self, but not doing much other activities. She feels her legs are very weak, cannot walk too far. She came in a wheelchair today.     MEDICAL HISTORY:  Past Medical History  Diagnosis Date  . Hypertension   . [place]   . NSTEMI (non-ST elevated myocardial infarction) ([facility] 03/16/2014    non-obs CAD, spasm seen  . Pneumonia 2009  . Arthritis     "right knee" (03/16/2014)  . [place]ary [place]ospasm ([facility] [place]; 03/16/2014    Prinz-Metal's angina  . [place] [place]  . Family history of breast cancer   . Family history of pancreatic cancer   . Family history of breast cancer in female     SURGICAL HISTORY: Past Surgical History  Procedure Laterality Date  . Other surgical history  1991    Tubal pregnancy  . Cardiac catheterization  2010; 03/16/2014  . Tubal ligation  1981  . Left heart catheterization with coronary angiogram N/A 03/16/2014    Procedure: LEFT HEART CATHETERIZATION WITH CORONARY ANGIOGRAM;  Surgeon: Sherren Mocha, MD; Non-obs CAD, EF 55%  . Bunionectomy Bilateral   . Breast biopsy Left 08/06/14  . Portacath placement N/A 08/28/2014    Procedure: INSERTION PORT-A-CATH;  Surgeon: Autumn Messing III, MD;  Location: [facility];  Service: General;  Laterality: N/A;   GYN HISTORY  Menarchal: 15 LMP: 51 Contraceptive:  4 HRT: no G2P2: no breast feeding     SOCIAL HISTORY: Social History   Social History  . Marital Status: Legally Separated    Spouse Name: N/A  . Number of Children: Daughter 79, son 51   . Years of Education: N/A   Occupational History  . Works for post office    Social History Main Topics  . Smoking status: Never Smoker   . Smokeless tobacco: Never Used  . Alcohol Use: No  . Drug Use: No  . Sexual Activity: No   Other Topics Concern  . Not on file   Social History Narrative    FAMILY HISTORY: Family History  Problem Relation Age of Onset  . Pancreatic cancer Brother 33  . Cancer Brother 40    breast cancer   . Cancer Mother 70    colon cancer   . Hypertension Mother   . Diabetes type II Mother   . CVA Father   . Healthy Sister   . Healthy Sister     ALLERGIES:  is allergic to codeine; carvedilol; compazine; percocet; and adhesive.  MEDICATIONS:  Current Outpatient Prescriptions  Medication Sig Dispense Refill  . acyclovir (ZOVIRAX) 400 MG tablet Take 1 tablet (400 mg total) by mouth 3 (three) times daily. 30 tablet 1  . amLODipine (NORVASC) 10 MG tablet Take 1 tablet (10 mg total) by mouth daily. 30 tablet 3  . aprepitant (EMEND) 80 MG capsule Take 1 capsule (80 mg total) by mouth daily. Start the day after chemotherapy. 3 capsule 1  . atorvastatin (LIPITOR) 40 MG tablet Take 1 tablet (40 mg total) by mouth daily at 6 PM. 30 tablet 11  . dexamethasone (DECADRON) 4 MG tablet Take 2 pills once the day after chemotherapy and then 2 times a day for 2 days. Take with food 30 tablet 1  . hydrochlorothiazide (HYDRODIURIL) 25 MG tablet Take 25 mg by mouth daily.    . irbesartan (AVAPRO) 300 MG tablet take 1 tablet by mouth once daily 30 tablet 5  . isosorbide mononitrate (IMDUR) 30 MG 24 hr tablet Take 1 tablet (30 mg total) by mouth daily. 30 tablet 1  . Lidocaine-Prilocaine, Bulk, 2.5-2.5 % CREA Apply 5 mLs topically as needed. 1.5 hours before use with  chemotherapy.Cover with plastic 30 g 3  . LORazepam (ATIVAN) 0.5 MG tablet Take 1 tablet by mouth every 6 hours as needed for nausea or vomiting. 30 tablet 1  . ondansetron (ZOFRAN) 8 MG tablet Take 1 tablet (8 mg total) by mouth 2 (two) times daily as needed for nausea or vomiting. Start on 3rd day after Chemotherapy 30 tablet 1  . potassium chloride (K-DUR) 10 MEQ tablet Take 1 tablet (10 mEq total) by mouth daily. 30 tablet 11  . promethazine (PHENERGAN) 12.5 MG tablet Take 1-2 tablets (12.5-25 mg total) by mouth every 6 (six) hours as needed for nausea or vomiting. 40 tablet 2  . gabapentin (NEURONTIN) 100 MG capsule Take 1 capsule (100 mg total) by mouth 3 (three) times  daily. 90 capsule 1  . nitroGLYCERIN (NITROSTAT) 0.4 MG SL tablet Place 1 tablet (0.4 mg total) under the tongue every 5 (five) minutes as needed for chest pain. (Patient not taking: Reported on 02/12/2015) 25 tablet 12   No current facility-administered medications for this visit.    REVIEW OF SYSTEMS:   Constitutional: Denies fevers, chills or abnormal night sweats Eyes: Denies blurriness of vision, double vision or watery eyes Ears, nose, mouth, throat, and face: Denies mucositis or sore throat Respiratory: Denies cough, dyspnea or wheezes Cardiovascular: Denies palpitation, chest discomfort or lower extremity swelling Gastrointestinal:  Denies nausea, heartburn or change in bowel habits Skin: Denies abnormal skin rashes Lymphatics: Denies new lymphadenopathy or easy bruising Neurological:Denies numbness, tingling or new weaknesses Behavioral/Psych: Mood is stable, no new changes  All other systems were reviewed with the patient and are negative.  PHYSICAL EXAMINATION: ECOG PERFORMANCE STATUS: 2-3  Filed Vitals:   02/12/15 1030  BP: 108/70  Pulse: 106  Temp: 98.9 F (37.2 C)  Resp: 18   Filed Weights   02/12/15 1030  Weight: 179 lb 4.8 oz (81.33 kg)    GENERAL:alert, no distress and comfortable SKIN:  skin color, texture, turgor are normal, no rashes or significant lesions, (+) skin pigmentation nails, no significant nail thickening  EYES: normal, conjunctiva are pink and non-injected, sclera clear OROPHARYNX:no exudate, no erythema and lips, buccal mucosa, and tongue normal  NECK: supple, thyroid normal size, non-tender, without nodularity LYMPH:  no palpable lymphadenopathy in the cervical, axillary or inguinal LUNGS: clear to auscultation and percussion with normal breathing effort HEART: regular rate & rhythm and no murmurs and no lower extremity edema ABDOMEN:abdomen soft, non-tender and normal bowel sounds Musculoskeletal:no cyanosis of digits and no clubbing  PSYCH: alert & oriented x 3 with fluent speech NEURO: no focal motor/sensory deficits Breasts: Breast inspection showed them to be symmetrical with no nipple discharge. Slight left nipple retraction. A 1.0cm (imnitially 2.5 cm) mass behind the nipple. Palpation of the right breasts and both axillas revealed no obvious mass that I could appreciate.   LABORATORY DATA:  I have reviewed the data as listed CBC Latest Ref Rng 02/12/2015 01/26/2015 01/19/2015  WBC 3.9 - 10.3 10e3/uL 4.3 3.5(L) 2.9(L)  Hemoglobin 11.6 - 15.9 g/dL 10.4(L) 10.9(L) 10.4(L)  Hematocrit 34.8 - 46.6 % 32.4(L) 32.6(L) 31.5(L)  Platelets 145 - 400 10e3/uL 226 276 257    CMP Latest Ref Rng 02/12/2015 01/26/2015 01/19/2015  Glucose 70 - 140 mg/dl 116 130 118  BUN 7.0 - 26.0 mg/dL 13.3 14.8 18.6  Creatinine 0.6 - 1.1 mg/dL 1.0 1.3(H) 1.3(H)  Sodium 136 - 145 mEq/L 141 137 137  Potassium 3.5 - 5.1 mEq/L 3.9 3.6 3.9  CO2 22 - 29 mEq/L _0 Calcium 8.4 - 10.4 mg/dL 10.6(H) 10.7(H) 10.4  Total Protein 6.4 - 8.3 g/dL 6.3(L) 6.9 6.7  Total Bilirubin 0.20 - 1.20 mg/dL 0.44 0.76 0.71  Alkaline Phos 40 - 150 U/L 71 70 64  AST 5 - 34 U/L _1 ALT 0 - 55 U/L _2 PATHOLOGY REPORT:  Diagnosis 08/06/2014 1. Breast, left, needle core biopsy, 9:30  o'clock, 1 CMFN - INVASIVE DUCTAL CARCINOMA, SEE COMMENT. 2. Lymph node, needle/core biopsy, left axillary lymph node - ONE LYMPH NODE, POSITIVE FOR METASTATIC MAMMARY CARCINOMA (1/1) 1. Although the grade of tumor is best assessed at resection, with these biopsies, the invasive tumor is grade 1. Breast prognostic studies are pending (  part 1 only) and will be reported in an addendum. The case is reviewed with Dr. Avis Epley who concurs. (CRR:gt, 08/07/14)  Results: IMMUNOHISTOCHEMICAL AND MORPHOMETRIC ANALYSIS BY THE AUTOMATED CELLULAR IMAGING SYSTEM (ACIS) Estrogen Receptor: 100%, POSITIVE, STRONG STAINING INTENSITY (PERFORMED MANUALLY) Progesterone Receptor: 5%, POSITIVE, WEAK STAINING INTENSITY Proliferation Marker Ki67: 50% (PERFORMED MANUALLY)  Results: HER2 - NEGATIVE RATIO OF HER2/CEP17 SIGNALS 1.47 AVERAGE HER2 COPY NUMBER PER CELL 2.35  RADIOGRAPHIC STUDIES: I have personally reviewed the radiological images as listed and agreed with the findings in the report.  Echo 08/24/2014 Study Conclusions  - Left ventricle: The cavity size was normal. Wall thickness was normal. Systolic function was normal. The estimated ejection fraction was in the range of 55% to 60%. - Left atrium: The atrium was mildly dilated.  MR breast b/l w wo contrast 02/01/2015  IMPRESSION: Interval decrease in size of retroareolar mass within the left breast with decreased conspicuity of previously described non mass enhancement extending from the posterior margin of the mass.  Interval decrease in cortical thickening of previously described left axillary lymph nodes.  RECOMMENDATION: Treatment plan for left breast malignancy.    ASSESSMENT & PLAN:  60 year old African-American female, postmenopausal, healthy and fit, who was found to have a left breast mass and a biopsy confirmed node metastasis by screening mammogram.  1. Left breast invasive ductal carcinoma, cT3N1M0, stage IIIA,  ER+/PR+/HER2- -I reviewed her mammograms, breast ultrasound and MRI findings and core needle biopsy results. -I reviewed her bone scan and CT scan results with her today. She has a very small uptake at T8 on bone scan, but no correlated changes on the CT scan. She is not symptomatic, I think the likelihood of bone metastasis is very low. CT scan was negative for any distant metastasis. - I recommend neoadjuvant chemotherapy. Giving the large primary tumor, node positive disease, high Ki-67, she has very high risk of cancer recurrence after surgery. -given her ER+/HER2- disease, I recommend dose dense Adriamycin and Cytoxan every 2 weeks for fourth cycle, followed by weekly Taxol for 12 weeks, she is finishing chemo today. She overall tolerated moderately well. -I reviewed her restaging breast MRI which showed excellent response to neoadjuvant chemo  -She declined clinical trial [facility] [id number]. -I introduced the clinical trial PALLAS, which is a randomized phase 3 study of adjuvant endocrine therapy with or without Ibrance in face to and 3 hormonal receptor positive and HER-2 negative breast cancer. She will think about it, we'll discuss further after she completes adjuvant radiation. -She will proceed with left lumpectomy and SLN biopsy when she recovers well from chemo. If SLN positive, she prefers ALND at the same time of breast surgery, she does not want to go back to OR second time for ALND.  -OK to remove her port during breast surgery -Given her strong positive ER/PR on tumor cells, I recommend AI for 10 years, will start after she completes radiation  -Giving her positive lymph nodes, she would likely need adjuvant radiation.   2. Fatigue, anorexia and weight loss  -secondary to chemo  -stable lately, I encouraged her to take nutritional supplement. - I strongly encouraging her to be more physically active -I recommend physical therapy to gain her strength back, she is interested, referral  was made today.  3. Cancer genetics She has very strong family history of breast, colon and pancreatic cancer, all in first-degree, I recommend her to see genetic counselor to ruled out inheritable breast cancer syndrome.  -her genetic testing result  was normal  4. History of non-STEMI in 03/2014 -She had cardiac catheterization, which showed mild diffuse coronary artery disease, but no need intervention.  -She will follow-up with her cardiologist Dr. Burt Knack -We discussed some of the chemotherapy drug may cause heart failure, we'll repeat her echo after chemo, she may need follow-up with Dr. Burt Knack during her chemotherapy.   5. Pancytopenia secondary to chemotherapy -overall stable, WBC and platelet count have recovered -Anemia stable, no need for blood transfusion. Continue monitoring  6. 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 -I recommend her to try Neurontin, we'll start her on 100 mg at bedtime, and increase to 335m at bedtime in a few weeks, and use it during the day also if she tolerates   Plan -She will proceed with left lumpectomy and sentinel lymph node biopsy, possible axillary lymph node dissection -Physical therapy referral today -I called in Neurontin for her today -I will see her back in 6 weeks, or 2 weeks after surgery   All questions were answered. The patient knows to call the clinic with any problems, questions or concerns. I spent 20 minutes counseling the patient face to face. The total time spent in the appointment was 25 minutes and more than 50% was on counseling.     FTruitt Merle MD 02/12/2015 9:41 PM

## 2015-02-17 ENCOUNTER — Ambulatory Visit: Payer: Federal, State, Local not specified - PPO | Attending: Hematology | Admitting: Physical Therapy

## 2015-02-17 DIAGNOSIS — T451X5A Adverse effect of antineoplastic and immunosuppressive drugs, initial encounter: Secondary | ICD-10-CM | POA: Diagnosis present

## 2015-02-17 DIAGNOSIS — X58XXXA Exposure to other specified factors, initial encounter: Secondary | ICD-10-CM | POA: Insufficient documentation

## 2015-02-17 DIAGNOSIS — R29898 Other symptoms and signs involving the musculoskeletal system: Secondary | ICD-10-CM | POA: Insufficient documentation

## 2015-02-17 DIAGNOSIS — G620 Drug-induced polyneuropathy: Secondary | ICD-10-CM | POA: Diagnosis present

## 2015-02-17 DIAGNOSIS — R2689 Other abnormalities of gait and mobility: Secondary | ICD-10-CM | POA: Diagnosis present

## 2015-02-17 DIAGNOSIS — R269 Unspecified abnormalities of gait and mobility: Secondary | ICD-10-CM | POA: Diagnosis present

## 2015-02-17 NOTE — Therapy (Signed)
[place], [place], [address] Phone: [phone]   Fax:  [phone]  Physical Therapy Evaluation  Patient Details  Name: Mary Randall MRN: [id number] Date of Birth: [birth year] Referring Provider: Dr. Truitt Merle  Encounter Date: 02/17/2015      PT End of Session - 02/17/15 1245    Visit Number 1   Number of Visits 9   Date for PT Re-Evaluation 03/19/15   PT Start Time 1028   PT Stop Time 1125   PT Time Calculation (min) 57 min   Activity Tolerance Patient tolerated treatment well   Behavior During Therapy [facility] for tasks assessed/performed      Past Medical History  Diagnosis Date  . Hypertension   . Angina effort ([place])   . NSTEMI (non-ST elevated myocardial infarction) ([place]) 03/16/2014    non-obs CAD, spasm seen  . Pneumonia 2009  . Arthritis     "right knee" (03/16/2014)  . 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 Breast cancer ([place]) 2016  . Family history of breast cancer   . Family history of pancreatic cancer   . Family history of breast cancer in female     Past Surgical History  Procedure Laterality Date  . Other surgical history  1991    Tubal pregnancy  . Cardiac catheterization  2010; 03/16/2014  . Tubal ligation  1981  . Left heart catheterization with coronary angiogram N/A 03/16/2014    Procedure: LEFT HEART CATHETERIZATION WITH CORONARY ANGIOGRAM;  Surgeon: Sherren Mocha, MD; Non-obs CAD, EF 55%  . Bunionectomy Bilateral   . Breast biopsy Left 08/06/14  . Portacath placement N/A 08/28/2014    Procedure: INSERTION PORT-A-CATH;  Surgeon: Autumn Messing III, MD;  Location: [place];  Service: General;  Laterality: N/A;    There were no vitals filed for this visit.  Visit Diagnosis:  Weakness of both lower extremities - Plan: PT plan of care cert/re-cert  Weakness of both upper extremities - Plan: PT plan of care cert/re-cert  Gait abnormality - Plan: PT plan  of care cert/re-cert  Impairment of balance - Plan: PT plan of care cert/re-cert  Peripheral neuropathy due to chemotherapy [facility]) - Plan: PT plan of care cert/re-cert      Subjective Assessment - 02/17/15 1033    Subjective Having leg weakness, hand and foot tingling, and wants to be strong enough for surgery.   Patient is accompained by: Family member  dtr.   Pertinent History Left breast cancer diagnosed August 2016; has completed neo-adjuvant chemotherapy 01/26/15 and is to have surgery soon, but it is not scheduled. Port will be removed at that time and surgery will be with Dr. Marlou Starks; lumpectomy is planned, followed by radiation.  HTN controlled with meds.  Has had two "heart spasms" and a mild MI; has nitroglyceringprn.   Patient Stated Goals strengthen my legs so that I can get around without my daughter's assistance and go back to work   Currently in Pain? Yes   Pain Score 7    Pain Location Finger (Comment which one)  all fingers and feet   Pain Orientation Right;Left   Pain Descriptors / Indicators Tingling;Other (Comment)  and pain   Pain Frequency Constant   Aggravating Factors  at night for the feet; hand pain is constant   Pain Relieving Factors moving around (for the feet); running warm water over hands  [facility] PT Assessment - 02/17/15 0001    Assessment   Medical Diagnosis left breast cancer s/p neo-adjuvant chemo   Referring Provider Dr. 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 1  she passed out after getting dizzy; gets dizzy at night   Has the patient had a decrease in activity level because of a fear of falling?  Yes  may be for a different reason   Is the patient reluctant to leave their home because of a fear of falling?  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%, rides other; Barista of any weight   Leisure no exercise outside of work   Cognition   Overall Cognitive Status Within Functional Limits for tasks assessed   Observation/Other Assessments   Quick DASH  93.18   Sensation   Light Touch Impaired Detail   Light Touch Impaired Details --  intact at fingertips; mildly impaired at toes and feet   Posture/Postural Control   Posture/Postural Control Postural limitations   Postural Limitations Forward head   ROM / Strength   AROM / PROM / Strength AROM;Strength   AROM   Overall AROM Comments both UEs grossly WFL; LEs also [facility] except ankle inversion and eversion   Strength   Overall Strength Comments grip strength dynamometer on 2nd setting, right 30/28/25 lbs., left 20/20/20.  Both shoulders grossly 5/5, biceps 5/5, triceps 4/5; wrist flexion and extension 5/5.     Strength Assessment Site Hip;Knee;Ankle   Right/Left Hip Right;Left   Right Hip Flexion 4/5   Right Hip Extension 3-/5   Right Hip ABduction 3-/5   Right Hip ADduction 3-/5   Left Hip Flexion 4/5   Left Hip Extension 3-/5   Left Hip ABduction 4-/5   Left Hip ADduction 3-/5   Right/Left Knee Right;Left   Right Knee Flexion 3+/5   Right Knee Extension 4/5   Left Knee Flexion 3+/5   Left Knee Extension 4/5   Right/Left Ankle Right;Left   Right Ankle Dorsiflexion 4+/5   Right Ankle Inversion 3-/5  or less; little active motion in sitting; slight stiffness p   Right Ankle Eversion 4+/5   Left Ankle Dorsiflexion 4+/5    Left Ankle Inversion 3-/5  little active motion; slight stiffness with passive   Left Ankle Eversion 4+/5   Ambulation/Gait   Ambulation/Gait Yes   Ambulation/Gait Assistance 4: Min guard   Assistive device None   Gait Pattern Step-through pattern;Wide base of support   Stairs Yes   Stairs Assistance 4: Min assist           LYMPHEDEMA/ONCOLOGY QUESTIONNAIRE - 02/17/15 1048    Type   Cancer Type left breast cancer s/p chemo   Treatment   Past Chemotherapy Treatment Yes   Date 01/26/15   Lymphedema  Assessments   Lymphedema Assessments Upper extremities           Quick Dash - 02/17/15 0001    Open a tight or new jar Unable   Do heavy household chores (wash walls, wash floors) Unable   Carry a shopping bag or briefcase Severe difficulty   Wash your back Unable   Use a knife to cut food Unable   Recreational activities in which you take some force or impact through your arm, shoulder, or hand (golf, hammering, tennis) Unable   During the past week, to what extent has your arm, shoulder or hand problem interfered with your normal social activities with family, friends, neighbors, or groups? Extremely   During the past week, to what extent has your arm, shoulder or hand problem limited your work or other regular daily activities Extremely   Arm, shoulder, or hand pain. Extreme   Tingling (pins and needles) in your arm, shoulder, or hand Extreme   Difficulty Sleeping Moderate difficulty   DASH Score 93.18 %                             Long Term Clinic Goals - 02/17/15 1709    CC Long Term Goal  #1   Title independent with home exercise program for strengthening of legs and hands   Time 4   Period Weeks   Status New   CC Long Term Goal  #2   Title Demonstrate safe gait using appropriate assistive device characterized by a normal base of support   Time 4   Period Weeks   Status New   CC Long Term Goal  #3   Title be able to stand from sitting  without use of upper extremities   Time 4   Period Weeks   Status New   CC Long Term Goal  #4   Title decrease quick DASH score to 50 or less   Time 4   Period Weeks   Status New   CC Long Term Goal  #5   Title increase hip strength to at least 3/5 for all motions   Time 4   Period Weeks   Status New   Additional Goals   Additional Goals Yes            Plan - 02/17/15 1246    Clinical Impression Statement Pleasant woman accompanied by her supportive daughter today.  She was diagnosed with left breast cancer in August 2016, underwent neo-adjuvant chemotherapy which she coompleted 01/26/15, and now is expecting to have lumpectomy and radiation treatment.  She has paresthesias with mildly decreased light touch sensation to fingertips, toes, and feet. She has weakness, particularly in both hands and proximal joints of her legs.  Her gait is unsteady, wide base of support, and needs an assistive device, though she isn't currently using one.  Although formal testing wasn't done for balance today, it was obvious that there are deficits.   Pt will benefit from skilled therapeutic intervention in order to improve on the following deficits Decreased strength;Abnormal gait;Decreased balance;Pain;Postural dysfunction;Decreased mobility   Rehab Potential Good   Clinical Impairments Affecting Rehab Potential anticipating surgery soon   PT Frequency 2x / week   PT Duration 4 weeks   PT Treatment/Interventions ADLs/Self Care Home Management;Electrical Stimulation;Moist Heat;Contrast Bath;DME Instruction;Gait training;Stair training;Functional mobility training;Therapeutic exercise;Balance training;Neuromuscular re-education;Patient/family education;Manual techniques;Passive range of motion;Vestibular   PT Next Visit Plan Consider doing Berg balance testing and  add appropriate balance goal.  Begin strengthening exercise program for hands, hips, and knees especially; teach beginning HEP.   Consulted and  Agree with Plan of Care Patient         Problem List Patient Active Problem List   Diagnosis Date Noted  . UTI (urinary tract infection) 12/02/2014  . Chemotherapy induced nausea and vomiting 11/03/2014  . Anemia due to antineoplastic chemotherapy 11/03/2014  . Genetic testing 10/13/2014  . Family history of breast cancer   . Family history of pancreatic cancer   . Family history of breast cancer in female   . Breast cancer of upper-inner quadrant of left female breast ([place]) 08/16/2014  . BP (high blood pressure) 04/17/2014  . NSTEMI (non-ST elevated myocardial infarction) ([place]) 03/16/2014  . 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 Chest pain 09/04/2012  . Essential hypertension, benign 09/04/2012  . Angina pectoris with documented spasm ([place]) 03/05/2012  . Coronary artery spasm ([place]) 03/05/2012  . Arthritis of knee, degenerative 12/04/2011    Dontell Mian 02/17/2015, 5:25 PM  [place], [place], [address] Phone: [phone]   Fax:  [phone]  Name: Mary Randall MRN: [id number] Date of Birth: [birth year]   Serafina Royals, PT 02/17/2015 5:25 PM

## 2015-02-19 ENCOUNTER — Ambulatory Visit: Payer: Federal, State, Local not specified - PPO

## 2015-02-19 DIAGNOSIS — R269 Unspecified abnormalities of gait and mobility: Secondary | ICD-10-CM

## 2015-02-19 DIAGNOSIS — R29898 Other symptoms and signs involving the musculoskeletal system: Secondary | ICD-10-CM

## 2015-02-19 DIAGNOSIS — G620 Drug-induced polyneuropathy: Secondary | ICD-10-CM

## 2015-02-19 DIAGNOSIS — R2689 Other abnormalities of gait and mobility: Secondary | ICD-10-CM

## 2015-02-19 DIAGNOSIS — T451X5A Adverse effect of antineoplastic and immunosuppressive drugs, initial encounter: Secondary | ICD-10-CM

## 2015-02-19 NOTE — Patient Instructions (Addendum)
Cancer Rehab [id number] Hamstring Stretch IN CHAIR    Inhale and straighten spine. Exhale and lean forward toward extended leg. Hold position for _20-30__ seconds. Inhale and come back to center. Repeat with other leg extended. Repeat _2-3__ times, alternating legs. Do _2-4__ times per day.  Copyright  VHI. All rights reserved.   Piriformis Stretch, Sitting   Sit, one ankle on opposite knee, same-side hand on crossed knee. Push down on knee, keeping spine straight. Lean torso forward, with flat back, until tension is felt in gluteals of crossed-leg side. Hold _20-30__ seconds.  Repeat _2-3__ times per session. Do _2-4__ sessions per day.  Copyright  VHI. All rights reserved.    Flexion and Extension - Standing    Stand and support self while swinging leg and hip forward and backward _10__ times each. First 10 forward, then 10 backwards holding for 3 seconds Repeat with opposite leg. Do _2-3__ times per day.  Copyright  VHI. All rights reserved.   ABDUCTION: Standing (Active)    Stand, feet flat. Lift right leg out to side. Then repeat on other side, holding each for 3 seconds. Complete _10__ sets of _1__ repetitions. Perform _3-4__ sessions per day.  http://gtsc.exer.us/111   Copyright  VHI. All rights reserved.

## 2015-02-19 NOTE — Therapy (Signed)
[place], [place], [address] Phone: [phone]   Fax:  [phone]  Physical Therapy Treatment  Patient Details  Name: Mary Randall MRN: [id number] Date of Birth: [birth year] Referring Provider: Dr. Truitt Merle  Encounter Date: 02/19/2015      PT End of Session - 02/19/15 1041    Visit Number 2   Number of Visits 9   Date for PT Re-Evaluation 03/19/15   PT Start Time 0809  Pt arrived late   PT Stop Time 0848   PT Time Calculation (min) 39 min   Activity Tolerance Patient tolerated treatment well   Behavior During Therapy [facility] for tasks assessed/performed      Past Medical History  Diagnosis Date  . Hypertension   . Angina effort ([place])   . NSTEMI (non-ST elevated myocardial infarction) ([place]) 03/16/2014    non-obs CAD, spasm seen  . Pneumonia 2009  . Arthritis     "right knee" (03/16/2014)  . 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 Breast cancer ([place]) 2016  . Family history of breast cancer   . Family history of pancreatic cancer   . Family history of breast cancer in female     Past Surgical History  Procedure Laterality Date  . Other surgical history  1991    Tubal pregnancy  . Cardiac catheterization  2010; 03/16/2014  . Tubal ligation  1981  . Left heart catheterization with coronary angiogram N/A 03/16/2014    Procedure: LEFT HEART CATHETERIZATION WITH CORONARY ANGIOGRAM;  Surgeon: Sherren Mocha, MD; Non-obs CAD, EF 55%  . Bunionectomy Bilateral   . Breast biopsy Left 08/06/14  . Portacath placement N/A 08/28/2014    Procedure: INSERTION PORT-A-CATH;  Surgeon: Autumn Messing III, MD;  Location: [facility];  Service: General;  Laterality: N/A;    There were no vitals filed for this visit.  Visit Diagnosis:  Weakness of both lower extremities  Gait abnormality  Impairment of balance  Peripheral neuropathy due to chemotherapy [facility])      Subjective Assessment -  02/19/15 0813    Subjective My legs feel really weak today, like always.    Pertinent History Left breast cancer diagnosed August 2016; has completed neo-adjuvant chemotherapy 01/26/15 and is to have surgery soon, but it is not scheduled. Port will be removed at that time and surgery will be with Dr. Marlou Starks; lumpectomy is planned, followed by radiation.  HTN controlled with meds.  Has had two "heart spasms" and a mild MI; has nitroglyceringprn.   Patient Stated Goals strengthen my legs so that I can get around without my daughter's assistance and go back to work   Currently in Pain? No/denies            [facility] PT Assessment - 02/19/15 0001    Berg Balance Test   Sit to Stand Able to stand  independently using hands   Standing Unsupported Able to stand 2 minutes with supervision   Sitting with Back Unsupported but Feet Supported on Floor or Stool Able to sit safely and securely 2 minutes   Stand to Sit Sits safely with minimal use of hands   Transfers Able to transfer safely, definite need of hands   Standing Unsupported with Eyes Closed Able to stand 10 seconds with supervision   Standing Ubsupported with Feet Together Able to place feet together independently and stand for 1 minute with supervision   From Standing, Reach Forward with Outstretched Arm  Can reach forward >12 cm safely (5")   From Standing Position, Pick up Object from [place] to pick up shoe, needs supervision   From Standing Position, Turn to Look Behind Over each Shoulder Looks behind from both sides and weight shifts well   Turn 360 Degrees Able to turn 360 degrees safely but slowly   Standing Unsupported, Alternately Place Feet on Step/Stool Needs assistance to keep from falling or unable to try   Standing Unsupported, One Foot in [place] to take small step independently and hold 30 seconds   Standing on One Leg Unable to try or needs assist to prevent fall   Total Score 37                     [facility]  Adult PT Treatment/Exercise - 02/19/15 0001    Knee/Hip Exercises: Stretches   Active Hamstring Stretch Both;3 reps;20 seconds  Seated in chair    Piriformis Stretch 2 reps;20 seconds  Seated in chair   Knee/Hip Exercises: Aerobic   Recumbent Bike Pt fatigued very quickly with this (after 20 seconds) but started with very quick pace and required cuing to slow down repeatedly. Did 15-30 second intervals about 5-6 times.   Knee/Hip Exercises: Seated   Long Arc Quad Both;2 sets;5 reps  5 seconds                PT Education - 02/19/15 0827    Education provided Yes   Education Details LE strength; instructed pt on beginning a walking program 5-10 minutes a few times a day around her house or outside (she said she can walk outside at her home) to begin building up her endurance and strength to help minimize CIPN.   Person(s) Educated Patient   Methods Explanation;Demonstration;Handout   Comprehension Verbalized understanding;Returned demonstration;Need further instruction                [facility] Goals - 02/17/15 1709    CC Long Term Goal  #1   Title independent with home exercise program for strengthening of legs and hands   Time 4   Period Weeks   Status New   CC Long Term Goal  #2   Title Demonstrate safe gait using appropriate assistive device characterized by a normal base of support   Time 4   Period Weeks   Status New   CC Long Term Goal  #3   Title be able to stand from sitting without use of upper extremities   Time 4   Period Weeks   Status New   CC Long Term Goal  #4   Title decrease quick DASH score to 50 or less   Time 4   Period Weeks   Status New   CC Long Term Goal  #5   Title increase hip strength to at least 3/5 for all motions   Time 4   Period Weeks   Status New   Additional Goals   Additional Goals Yes            Plan - 02/19/15 1042    Clinical Impression Statement Pt becomes fatigued very quickly especially in her  LE's. Even became slightly SOB when on the bike. BERG balance test done today and she scored a 37, shared results with pt and the fact that this suggested using an assistive device however with her CIPN in her hands this isn;t a reasonable option for her. Plus pt reports she hasn't ever  fallen due to balance (one time due to passing out when getting up from bed). She walks with a wide, antalgic gait so we also verbally worked on this today and pt demonstrated safer gait upon leaving today. Also issued initial HEP.    Pt will benefit from skilled therapeutic intervention in order to improve on the following deficits Decreased strength;Abnormal gait;Decreased balance;Pain;Postural dysfunction;Decreased mobility   Rehab Potential Good   Clinical Impairments Affecting Rehab Potential anticipating surgery soon   PT Frequency 2x / week   PT Duration 4 weeks   PT Treatment/Interventions ADLs/Self Care Home Management;Electrical Stimulation;Moist Heat;Contrast Bath;DME Instruction;Gait training;Stair training;Functional mobility training;Therapeutic exercise;Balance training;Neuromuscular re-education;Patient/family education;Manual techniques;Passive range of motion;Vestibular   PT Next Visit Plan Add appropriate balance goal (pt scored 37 this visit with PTA)  Continue strengthening exercise program for hands, hips, and knees especially; review and progress HEP.   PT Home Exercise Plan LE strength and pt to begin walking 5-10 minutes a few times/day.   Consulted and Agree with Plan of Care Patient        Problem List Patient Active Problem List   Diagnosis Date Noted  . UTI (urinary tract infection) 12/02/2014  . Chemotherapy induced nausea and vomiting 11/03/2014  . Anemia due to antineoplastic chemotherapy 11/03/2014  . Genetic testing 10/13/2014  . Family history of breast cancer   . Family history of pancreatic cancer   . Family history of breast cancer in female   . Breast cancer of upper-inner  quadrant of left female breast ([place]) 08/16/2014  . BP (high blood pressure) 04/17/2014  . NSTEMI (non-ST elevated myocardial infarction) ([place]) 03/16/2014  . 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 Chest pain 09/04/2012  . Essential hypertension, benign 09/04/2012  . Angina pectoris with documented spasm ([place]) 03/05/2012  . Coronary artery spasm ([place]) 03/05/2012  . Arthritis of knee, degenerative 12/04/2011    Otelia Limes, PTA 02/19/2015, 10:49 AM  [place], [place], [address] Phone: [phone]   Fax:  [phone]  Name: Mary Randall MRN: [id number] Date of Birth: 1955-09-20

## 2015-02-22 ENCOUNTER — Other Ambulatory Visit: Payer: Self-pay | Admitting: General Surgery

## 2015-02-22 ENCOUNTER — Ambulatory Visit: Payer: Federal, State, Local not specified - PPO

## 2015-02-22 DIAGNOSIS — R928 Other abnormal and inconclusive findings on diagnostic imaging of breast: Secondary | ICD-10-CM

## 2015-02-22 DIAGNOSIS — G620 Drug-induced polyneuropathy: Secondary | ICD-10-CM

## 2015-02-22 DIAGNOSIS — R269 Unspecified abnormalities of gait and mobility: Secondary | ICD-10-CM

## 2015-02-22 DIAGNOSIS — R29898 Other symptoms and signs involving the musculoskeletal system: Secondary | ICD-10-CM | POA: Diagnosis not present

## 2015-02-22 DIAGNOSIS — R2689 Other abnormalities of gait and mobility: Secondary | ICD-10-CM

## 2015-02-22 DIAGNOSIS — T451X5A Adverse effect of antineoplastic and immunosuppressive drugs, initial encounter: Secondary | ICD-10-CM

## 2015-02-22 NOTE — Therapy (Signed)
[place], [place], [address] Phone: [phone]   Fax:  [phone]  Physical Therapy Treatment  Patient Details  Name: Mary Randall MRN: [id number] Date of Birth: 26-Dec-1955 Referring Provider: Dr. Truitt Merle  Encounter Date: 02/22/2015      PT End of Session - 02/22/15 0837    Visit Number 3   Number of Visits 9   Date for PT Re-Evaluation 03/19/15   PT Start Time 0806  Pt arrived late   PT Stop Time 0845   PT Time Calculation (min) 39 min   Activity Tolerance Patient tolerated treatment well   Behavior During Therapy [facility] for tasks assessed/performed      Past Medical History  Diagnosis Date  . Hypertension   . Angina effort ([place])   . NSTEMI (non-ST elevated myocardial infarction) ([place]) 03/16/2014    non-obs CAD, spasm seen  . Pneumonia 2009  . Arthritis     "right knee" (03/16/2014)  . 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 Breast cancer ([place]) 2016  . Family history of breast cancer   . Family history of pancreatic cancer   . Family history of breast cancer in female     Past Surgical History  Procedure Laterality Date  . Other surgical history  1991    Tubal pregnancy  . Cardiac catheterization  2010; 03/16/2014  . Tubal ligation  1981  . Left heart catheterization with coronary angiogram N/A 03/16/2014    Procedure: LEFT HEART CATHETERIZATION WITH CORONARY ANGIOGRAM;  Surgeon: Sherren Mocha, MD; Non-obs CAD, EF 55%  . Bunionectomy Bilateral   . Breast biopsy Left 08/06/14  . Portacath placement N/A 08/28/2014    Procedure: INSERTION PORT-A-CATH;  Surgeon: Autumn Messing III, MD;  Location: [facility];  Service: General;  Laterality: N/A;    There were no vitals filed for this visit.  Visit Diagnosis:  Weakness of both lower extremities  Gait abnormality  Impairment of balance  Peripheral neuropathy due to chemotherapy (HCC)  Weakness of both upper  extremities      Subjective Assessment - 02/22/15 0808    Subjective I was very tired after last appointment, you were right. I got home and needed a long nap. But after that I felt pretty good over the weekend, got outside and walked a few times and that felt good.    Pertinent History Left breast cancer diagnosed August 2016; has completed neo-adjuvant chemotherapy 01/26/15 and is to have surgery soon, but it is not scheduled. Port will be removed at that time and surgery will be with Dr. Marlou Starks; lumpectomy is planned, followed by radiation.  HTN controlled with meds.  Has had two "heart spasms" and a mild MI; has nitroglyceringprn.   Patient Stated Goals strengthen my legs so that I can get around without my daughter's assistance and go back to work   Currently in Pain? No/denies                         [facility] Adult PT Treatment/Exercise - 02/22/15 0001    Knee/Hip Exercises: Aerobic   Recumbent Bike Pt able to tolerate almost 3 minutes with 4 brief rest breaks. Improvement from last time as she was able to go for 58 seconds before first rest where as last session was only 18 seconds.   Knee/Hip Exercises: Standing   Hip Flexion Stengthening;Both;2 sets;5 reps;Knee bent   Hip Abduction Stengthening;Both;2 sets;5  reps   Abduction Limitations Verbal cuing and demonstration for correct technique   Hip Extension Stengthening;Both;2 sets;5 reps   Extension Limitations Cuing for full ROM and slow, control pace   Forward Step Up Both;5 reps;Hand Hold: 2;Step Height: 4"   Other Standing Knee Exercises Gait for endurance: Pt did 422 feet in 2:38,  3-4 RPE after.   Knee/Hip Exercises: Seated   Long Arc Quad Strengthening;Both;2 sets;5 reps  5 second holds   [facility] 2 sets of 10 reps with small football lengthwise   Other Seated Knee/Hip Exercises Alternate marching 10 reps without rest today; Tilt Board front-back and side-side 1 set of 30 seconds each.   Hand Exercises   MCPJ  Flexion Strengthening;Both;Other (comment)  30 seconds each with DigiFlex   MCPJ Flexion Limitations Pt tolerated this well as long as nothing touches her fingertips per her report.    Other Hand Exercises Velcro Board on large strip: Large roll push and pull 2 reps each hand, wrist supination and pronation with long handle 1 rep each direction, on small strip key turns 1 rep each direction                PT Education - 02/22/15 0840    Education provided Yes   Education Details Pt to get small ball for squeezing at home and try using can of veggies (1 lb) for gentle wrist strength)   Person(s) Educated Patient   Methods Explanation;Demonstration   Comprehension Verbalized understanding                [facility] Goals - 02/17/15 1709    CC Long Term Goal  #1   Title independent with home exercise program for strengthening of legs and hands   Time 4   Period Weeks   Status New   CC Long Term Goal  #2   Title Demonstrate safe gait using appropriate assistive device characterized by a normal base of support   Time 4   Period Weeks   Status New   CC Long Term Goal  #3   Title be able to stand from sitting without use of upper extremities   Time 4   Period Weeks   Status New   CC Long Term Goal  #4   Title decrease quick DASH score to 50 or less   Time 4   Period Weeks   Status New   CC Long Term Goal  #5   Title increase hip strength to at least 3/5 for all motions   Time 4   Period Weeks   Status New   Additional Goals   Additional Goals Yes            Plan - 02/22/15 0837    Clinical Impression Statement Pt did have some improvement with endurance today still requiring frequent seated or standing rest breaks, but was able to tolerate increase sets of reps and new exercises. She was pleased with herself for walking a few times over the weekend and felt better for it. This seemed to help her endurance today as well. She hasn't gotten to try HEP  yet.    Pt will benefit from skilled therapeutic intervention in order to improve on the following deficits Decreased strength;Abnormal gait;Decreased balance;Pain;Postural dysfunction;Decreased mobility   Rehab Potential Good   Clinical Impairments Affecting Rehab Potential anticipating surgery soon   PT Frequency 2x / week   PT Duration 4 weeks   PT Treatment/Interventions ADLs/Self Care Home  Management;Electrical Stimulation;Moist Heat;Contrast Bath;DME Instruction;Gait training;Stair training;Functional mobility training;Therapeutic exercise;Balance training;Neuromuscular re-education;Patient/family education;Manual techniques;Passive range of motion;Vestibular   PT Next Visit Plan PT to add appropriate balance goal (pt scored 37 this visit with PTA)  Continue strengthening exercise program for hands, hips, and knees especially; review and progress HEP.   PT Home Exercise Plan LE strength and pt to begin walking 5-10 minutes a few times/day.   Consulted and Agree with Plan of Care Patient        Problem List Patient Active Problem List   Diagnosis Date Noted  . UTI (urinary tract infection) 12/02/2014  . Chemotherapy induced nausea and vomiting 11/03/2014  . Anemia due to antineoplastic chemotherapy 11/03/2014  . Genetic testing 10/13/2014  . Family history of breast cancer   . Family history of pancreatic cancer   . Family history of breast cancer in female   . Breast cancer of upper-inner quadrant of left female breast ([place]) 08/16/2014  . BP (high blood pressure) 04/17/2014  . NSTEMI (non-ST elevated myocardial infarction) ([place]) 03/16/2014  . 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 Chest pain 09/04/2012  . Essential hypertension, benign 09/04/2012  . Angina pectoris with documented spasm ([place]) 03/05/2012  . Coronary artery spasm ([place]) 03/05/2012  . Arthritis of knee, degenerative 12/04/2011    Otelia Limes, PTA 02/22/2015, 8:49 AM  [place], [place], [address] Phone: [phone]   Fax:  [phone]  Name: SANTRICE GRIGNON MRN: [id number] Date of Birth: [birth year]

## 2015-02-25 ENCOUNTER — Ambulatory Visit: Payer: Federal, State, Local not specified - PPO

## 2015-02-25 DIAGNOSIS — R29898 Other symptoms and signs involving the musculoskeletal system: Secondary | ICD-10-CM

## 2015-02-25 DIAGNOSIS — G620 Drug-induced polyneuropathy: Secondary | ICD-10-CM

## 2015-02-25 DIAGNOSIS — R2689 Other abnormalities of gait and mobility: Secondary | ICD-10-CM

## 2015-02-25 DIAGNOSIS — T451X5A Adverse effect of antineoplastic and immunosuppressive drugs, initial encounter: Secondary | ICD-10-CM

## 2015-02-25 DIAGNOSIS — R269 Unspecified abnormalities of gait and mobility: Secondary | ICD-10-CM

## 2015-02-25 NOTE — Therapy (Signed)
[place], [place], [address] Phone: [phone]   Fax:  [phone]  Physical Therapy Treatment  Patient Details  Name: Mary Randall MRN: [id number] Date of Birth: Aug 29, 1955 Referring Provider: Dr. Truitt Merle  Encounter Date: 02/25/2015      PT End of Session - 02/25/15 0850    Visit Number 4   Number of Visits 9   Date for PT Re-Evaluation 03/19/15   PT Start Time 0824  PT arrived late   PT Stop Time 0849   PT Time Calculation (min) 25 min   Activity Tolerance Patient tolerated treatment well   Behavior During Therapy [facility] for tasks assessed/performed      Past Medical History  Diagnosis Date  . Hypertension   . Angina effort ([place])   . NSTEMI (non-ST elevated myocardial infarction) ([place]) 03/16/2014    non-obs CAD, spasm seen  . Pneumonia 2009  . Arthritis     "right knee" (03/16/2014)  . 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 Breast cancer ([place]) 2016  . Family history of breast cancer   . Family history of pancreatic cancer   . Family history of breast cancer in female     Past Surgical History  Procedure Laterality Date  . Other surgical history  1991    Tubal pregnancy  . Cardiac catheterization  2010; 03/16/2014  . Tubal ligation  1981  . Left heart catheterization with coronary angiogram N/A 03/16/2014    Procedure: LEFT HEART CATHETERIZATION WITH CORONARY ANGIOGRAM;  Surgeon: Sherren Mocha, MD; Non-obs CAD, EF 55%  . Bunionectomy Bilateral   . Breast biopsy Left 08/06/14  . Portacath placement N/A 08/28/2014    Procedure: INSERTION PORT-A-CATH;  Surgeon: Autumn Messing III, MD;  Location: [place];  Service: General;  Laterality: N/A;    There were no vitals filed for this visit.  Visit Diagnosis:  Weakness of both lower extremities  Gait abnormality  Impairment of balance  Peripheral neuropathy due to chemotherapy (HCC)  Weakness of both upper  extremities      Subjective Assessment - 02/25/15 0827    Subjective Felt better after last appoinemtn, I wasn't as tired. Been doing more of the exercises around the house and continuing to walk more. Starting to be able to do some of my housework lately as well. And my fingers feel a little better today. I was individually massaging them lightly yesterday and I think that really helped.    Pertinent History Left breast cancer diagnosed August 2016; has completed neo-adjuvant chemotherapy 01/26/15 and is to have surgery soon, but it is not scheduled. Port will be removed at that time and surgery will be with Dr. Marlou Starks; lumpectomy is planned, followed by radiation.  HTN controlled with meds.  Has had two "heart spasms" and a mild MI; has nitroglyceringprn.   Patient Stated Goals strengthen my legs so that I can get around without my daughter's assistance and go back to work   Currently in Pain? No/denies                         [facility] Adult PT Treatment/Exercise - 02/25/15 0001    Knee/Hip Exercises: Aerobic   Recumbent Bike Pt went over 3 mins at level 1 with 1 rest break at 2:40, much improvement!   Knee/Hip Exercises: Standing   Forward Step Up Both;2 sets;5 reps;Hand Hold: 2;Step Height: 4"  With Airex on  step standing at back of bike   Other Standing Knee Exercises Standing in front of chair for mini squats with 1 lb dumbell raise on eccentric portion of squat 2 set of 5 reps   Knee/Hip Exercises: Seated   Ball Squeeze 2 sets of 10 reps, 5 second holds, with small football lengthwise   Clamshell with TheraBand Red  2 sets of 10 reps   Shoulder Exercises: Standing   Other Standing Exercises Wall Push ups 2 sets of 10 reps   Hand Exercises   MCPJ Flexion Strengthening;Both;Other (comment)  30 seconds each with Digiflex   DIPJ Flexion Strengthening;Both;5 reps  With DigiFlex each finger   Other Hand Exercises Velcro Board: tried small finger roll but pt reported no  difficulty with these or others from last session   Wrist Exercises   Wrist Flexion Strengthening;Both;10 reps;Seated;Bar weights/barbell   Bar Weights/Barbell (Wrist Flexion) 2 lbs                        [facility] Goals - 02/25/15 [id number]    CC Long Term Goal  #1   Title independent with home exercise program for strengthening of legs and hands  Pt is independent with initial HEP and has begun daily walking routine.   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    Status On-going   CC Long Term Goal  #3   Title be able to stand from sitting without use of upper extremities   Status On-going   CC Long Term Goal  #4   Title decrease quick DASH score to 50 or less   Status On-going   CC Long Term Goal  #5   Title increase hip strength to at least 3/5 for all motions   Status On-going            Plan - 02/25/15 0851    Clinical Impression Statement Pt arrived late today reporting her daughter overslept. She was able to pedal longer before needing a rest today and tolerated new exercises well. She still requires rest breaks throughout session but they are shorter in durations. She also reported feeling good after last session and has started feeling well enough to start doing more around the house. Pt is progressing well at this point and is showing good compliance with follow thru at home.    Pt will benefit from skilled therapeutic intervention in order to improve on the following deficits Decreased strength;Abnormal gait;Decreased balance;Pain;Postural dysfunction;Decreased mobility   Rehab Potential Good   Clinical Impairments Affecting Rehab Potential anticipating surgery soon    PT Frequency 2x / week   PT Duration 4 weeks   PT Treatment/Interventions ADLs/Self Care Home Management;Electrical Stimulation;Moist Heat;Contrast Bath;DME Instruction;Gait training;Stair training;Functional mobility training;Therapeutic exercise;Balance training;Neuromuscular re-education;Patient/family education;Manual techniques;Passive range of motion;Vestibular   PT Next Visit Plan PT to add appropriate balance goal (pt scored 37 this visit with PTA)  Continue strengthening exercise program for hands, hips, and knees especially; review and progress HEP.   PT Home Exercise Plan LE strength and pt to begin walking 5-10 minutes a few times/day.   Consulted and Agree with Plan of Care Patient  Problem List Patient Active Problem List   Diagnosis Date Noted  . UTI (urinary tract infection) 12/02/2014  . Chemotherapy induced nausea and vomiting 11/03/2014  . Anemia due to antineoplastic chemotherapy 11/03/2014  . Genetic testing 10/13/2014  . Family history of breast cancer   . Family history of pancreatic cancer   . Family history of breast cancer in female   . Breast cancer of upper-inner quadrant of left female breast ([place]) 08/16/2014  . BP (high blood pressure) 04/17/2014  . NSTEMI (non-ST elevated myocardial infarction) ([place]) 03/16/2014  . 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 Chest pain 09/04/2012  . Essential hypertension, benign 09/04/2012  . Angina pectoris with documented spasm ([place]) 03/05/2012  . Coronary artery spasm ([place]) 03/05/2012  . Arthritis of knee, degenerative 12/04/2011    Otelia Limes, PTA 02/25/2015, 8:56 AM  [place], [place], [address] Phone: [phone]   Fax:  [phone]  Name: Mary Randall MRN: [id number] Date of Birth: 08-Jan-1955

## 2015-03-01 ENCOUNTER — Ambulatory Visit: Payer: Federal, State, Local not specified - PPO

## 2015-03-01 DIAGNOSIS — G620 Drug-induced polyneuropathy: Secondary | ICD-10-CM

## 2015-03-01 DIAGNOSIS — R29898 Other symptoms and signs involving the musculoskeletal system: Secondary | ICD-10-CM

## 2015-03-01 DIAGNOSIS — R2689 Other abnormalities of gait and mobility: Secondary | ICD-10-CM

## 2015-03-01 DIAGNOSIS — R269 Unspecified abnormalities of gait and mobility: Secondary | ICD-10-CM

## 2015-03-01 DIAGNOSIS — T451X5A Adverse effect of antineoplastic and immunosuppressive drugs, initial encounter: Secondary | ICD-10-CM

## 2015-03-01 NOTE — Therapy (Signed)
[place], [place], [address] Phone: [phone]   Fax:  [phone]  Physical Therapy Treatment  Patient Details  Name: Mary Randall MRN: [id number] Date of Birth: [birth year] Referring Provider: Dr. Truitt Merle  Encounter Date: 03/01/2015      PT End of Session - 03/01/15 0921    Visit Number 5   Number of Visits 9   Date for PT Re-Evaluation 03/19/15   PT Start Time 0848   PT Stop Time 0935   PT Time Calculation (min) 47 min   Activity Tolerance Patient tolerated treatment well;Patient limited by fatigue  Just required longer rest breaks as session went on due to LE fatigue.    Behavior During Therapy [facility] for tasks assessed/performed      Past Medical History  Diagnosis Date  . Hypertension   . Angina effort ([place])   . NSTEMI (non-ST elevated myocardial infarction) ([place]) 03/16/2014    non-obs CAD, spasm seen  . Pneumonia 2009  . Arthritis     "right knee" (03/16/2014)  . 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 Breast cancer ([place]) 2016  . Family history of breast cancer   . Family history of pancreatic cancer   . Family history of breast cancer in female     Past Surgical History  Procedure Laterality Date  . Other surgical history  1991    Tubal pregnancy  . Cardiac catheterization  2010; 03/16/2014  . Tubal ligation  1981  . Left heart catheterization with coronary angiogram N/A 03/16/2014    Procedure: LEFT HEART CATHETERIZATION WITH CORONARY ANGIOGRAM;  Surgeon: Sherren Mocha, MD; Non-obs CAD, EF 55%  . Bunionectomy Bilateral   . Breast biopsy Left 08/06/14  . Portacath placement N/A 08/28/2014    Procedure: INSERTION PORT-A-CATH;  Surgeon: Autumn Messing III, MD;  Location: [facility];  Service: General;  Laterality: N/A;    There were no vitals filed for this visit.  Visit Diagnosis:  Weakness of both lower extremities  Gait abnormality  Impairment of  balance  Peripheral neuropathy due to chemotherapy [facility])      Subjective Assessment - 03/01/15 0849    Subjective Doing okay, nothing new to report. Fingertips are still slowly getting better.    Pertinent History Left breast cancer diagnosed August 2016; has completed neo-adjuvant chemotherapy 01/26/15 and is to have surgery soon, but it is not scheduled. Port will be removed at that time and surgery will be with Dr. Marlou Starks; lumpectomy is planned, followed by radiation.  HTN controlled with meds.  Has had two "heart spasms" and a mild MI; has nitroglyceringprn.   Patient Stated Goals strengthen my legs so that I can get around without my daughter's assistance and go back to work   Currently in Pain? No/denies                         [facility] Adult PT Treatment/Exercise - 03/01/15 0001    Knee/Hip Exercises: Stretches   Active Hamstring Stretch Both;3 reps;20 seconds  Seated in chair   Piriformis Stretch Both;2 reps;20 seconds  Seated in chair   Knee/Hip Exercises: Aerobic   Recumbent Bike Pt went 3:11 mins at level 1 with 1 rest break today at 2:00.    Knee/Hip Exercises: Machines for Strengthening   Cybex Knee Extension 10 lbs, 10 reps; cuing for full knee extension   Cybex Knee Flexion 25 lbs, 2 sets of  10 reps; verbal cuing for slow, controlled pace, rest break between sets   Cybex Leg Press 1 plate for 2 sets of 10 reps with demonstration for proper tecnique   Knee/Hip Exercises: Standing   Hip Flexion Stengthening;Both;10 reps;Knee straight  2 lbs each ankle   Hip Abduction Stengthening;Both;2 sets;5 reps  2 lbs each ankle   Hip Extension Stengthening;Both;10 reps  2 lbs each ankle   Knee/Hip Exercises: Seated   Long Arc Quad Strengthening;Both;10 reps;Weights   Long Arc Quad Weight 2 lbs.   Ball Squeeze 20 reps with football lengthwise. Pt able to do without rest today but did have LE fatigue after.   Clamshell with TheraBand Red  2 sets of 10 reps   Other  Seated Knee/Hip Exercises Alternate marching 10 reps 2 lbs each ankle   Knee/Hip Exercises: Supine   Bridges Strengthening;10 reps   Bridges with Clamshell Strengthening;15 reps;Other (comment)  5 sets of 3 reps   Straight Leg Raises Strengthening;Both;2 sets;5 reps                        [facility] Goals - 02/25/15 [id number]    CC Long Term Goal  #1   Title independent with home exercise program for strengthening of legs and hands  Pt is independent with initial HEP and has begun daily walking routine.   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    Status On-going   CC Long Term Goal  #3   Title be able to stand from sitting without use of upper extremities   Status On-going   CC Long Term Goal  #4   Title decrease quick DASH score to 50 or less   Status On-going   CC Long Term Goal  #5   Title increase hip strength to at least 3/5 for all motions   Status On-going            Plan - 03/01/15 [id number]    Clinical Impression Statement Pt tolerated increasing strengthening activities to include machines today. She tolerated this well overall, just needed longer rest breaks as the session continued today. But this allowed her to try 3 new things which she liked. Her overall endurance with therapy has improved, though slowly pt is pleased with progress being made.    Pt will benefit from skilled therapeutic intervention in order to improve on the following deficits Decreased strength;Abnormal gait;Decreased balance;Pain;Postural dysfunction;Decreased mobility   Rehab Potential Good   Clinical Impairments Affecting Rehab Potential anticipating surgery soon (Aug. 13)   PT Frequency 2x /  week   PT Duration 4 weeks   PT Treatment/Interventions ADLs/Self Care Home Management;Electrical Stimulation;Moist Heat;Contrast Bath;DME Instruction;Gait training;Stair training;Functional mobility training;Therapeutic exercise;Balance training;Neuromuscular re-education;Patient/family education;Manual techniques;Passive range of motion;Vestibular   PT Next Visit Plan Assess how pt tolerated machines today. Asess goals and cont LE strength.    Consulted and Agree with Plan of Care Patient        Problem List Patient Active Problem List   Diagnosis Date Noted  . UTI (urinary tract infection) 12/02/2014  . Chemotherapy induced nausea and vomiting 11/03/2014  . Anemia due to antineoplastic  chemotherapy 11/03/2014  . Genetic testing 10/13/2014  . Family history of breast cancer   . Family history of pancreatic cancer   . Family history of breast cancer in female   . Breast cancer of upper-inner quadrant of left female breast ([place]) 08/16/2014  . BP (high blood pressure) 04/17/2014  . NSTEMI (non-ST elevated myocardial infarction) ([place]) 03/16/2014  . 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 Chest pain 09/04/2012  . Essential hypertension, benign 09/04/2012  . Angina pectoris with documented spasm ([place]) 03/05/2012  . Coronary artery spasm ([place]) 03/05/2012  . Arthritis of knee, degenerative 12/04/2011    Otelia Limes, PTA 03/01/2015, 12:04 PM  [place], [place], [address] Phone: [phone]   Fax:  [phone]  Name: Mary Randall MRN: [id number] Date of Birth: [birth year]

## 2015-03-04 ENCOUNTER — Ambulatory Visit: Payer: Federal, State, Local not specified - PPO | Attending: Hematology

## 2015-03-04 ENCOUNTER — Telehealth: Payer: Self-pay | Admitting: *Deleted

## 2015-03-04 DIAGNOSIS — T451X5A Adverse effect of antineoplastic and immunosuppressive drugs, initial encounter: Secondary | ICD-10-CM | POA: Diagnosis present

## 2015-03-04 DIAGNOSIS — R269 Unspecified abnormalities of gait and mobility: Secondary | ICD-10-CM | POA: Diagnosis present

## 2015-03-04 DIAGNOSIS — R2689 Other abnormalities of gait and mobility: Secondary | ICD-10-CM | POA: Insufficient documentation

## 2015-03-04 DIAGNOSIS — R29898 Other symptoms and signs involving the musculoskeletal system: Secondary | ICD-10-CM | POA: Insufficient documentation

## 2015-03-04 DIAGNOSIS — G620 Drug-induced polyneuropathy: Secondary | ICD-10-CM | POA: Diagnosis present

## 2015-03-04 NOTE — Telephone Encounter (Signed)
Pt called requesting a letter from Dr. Burr Medico stating that pt is still under care of Dr. Burr Medico - for chemotherapy, surgery, and radiation.  Pt would like to be contacted when letter is ready. Pt's   Phone    [phone].

## 2015-03-04 NOTE — Therapy (Signed)
[place], [place], [address] Phone: [phone]   Fax:  [phone]  Physical Therapy Treatment  Patient Details  Name: Mary Randall MRN: [id number] Date of Birth: March 13, 1955 Referring Provider: Dr. Truitt Merle  Encounter Date: 03/04/2015      PT End of Session - 03/04/15 1104    Visit Number 6   Number of Visits 9   Date for PT Re-Evaluation 03/19/15   PT Start Time 1026  Pt running late   PT Stop Time 1104   PT Time Calculation (min) 38 min   Activity Tolerance Patient tolerated treatment well   Behavior During Therapy [facility] for tasks assessed/performed      Past Medical History  Diagnosis Date  . Hypertension   . Angina effort ([place])   . NSTEMI (non-ST elevated myocardial infarction) ([place]) 03/16/2014    non-obs CAD, spasm seen  . Pneumonia 2009  . Arthritis     "right knee" (03/16/2014)  . 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 Breast cancer ([place]) 2016  . Family history of breast cancer   . Family history of pancreatic cancer   . Family history of breast cancer in female     Past Surgical History  Procedure Laterality Date  . Other surgical history  1991    Tubal pregnancy  . Cardiac catheterization  2010; 03/16/2014  . Tubal ligation  1981  . Left heart catheterization with coronary angiogram N/A 03/16/2014    Procedure: LEFT HEART CATHETERIZATION WITH CORONARY ANGIOGRAM;  Surgeon: Sherren Mocha, MD; Non-obs CAD, EF 55%  . Bunionectomy Bilateral   . Breast biopsy Left 08/06/14  . Portacath placement N/A 08/28/2014    Procedure: INSERTION PORT-A-CATH;  Surgeon: Autumn Messing III, MD;  Location: [place];  Service: General;  Laterality: N/A;    There were no vitals filed for this visit.  Visit Diagnosis:  Weakness of both lower extremities  Gait abnormality  Impairment of balance  Peripheral neuropathy due to chemotherapy [facility])      Subjective Assessment -  03/04/15 1031    Subjective Feeling okay, not as tired. Slowly getting better. Rt knee is a little sore today, probably my arthritis flaring up.    Pertinent History Left breast cancer diagnosed August 2016; has completed neo-adjuvant chemotherapy 01/26/15 and is to have surgery soon, but it is not scheduled. Port will be removed at that time and surgery will be with Dr. Marlou Starks; lumpectomy is planned, followed by radiation.  HTN controlled with meds.  Has had two "heart spasms" and a mild MI; has nitroglyceringprn.   Patient Stated Goals strengthen my legs so that I can get around without my daughter's assistance and go back to work   Currently in Pain? No/denies                         [facility] Adult PT Treatment/Exercise - 03/04/15 0001    Knee/Hip Exercises: Stretches   Active Hamstring Stretch Both;2 reps;10 seconds  Seated edge of mat   Hip Flexor Stretch Both;2 reps;10 seconds  Rt LE on mat due to Rt knee soreness   Piriformis Stretch Both;2 reps;10 seconds  Seated edge of mat   Knee/Hip Exercises: Aerobic   Recumbent Bike 32minutes at level 1 <50 rpm, rest then 2 more minutes <30 rpm!   Knee/Hip Exercises: Machines for Strengthening   Cybex Knee Extension 20 lbs, 2 sets of 10 reps  Cybex Knee Flexion 25 lbs, 2 sets of 10 reps cuing for slower pace   Cybex Leg Press 1 plate for 2 sets of 10 reps.   Knee/Hip Exercises: Standing   Hip Flexion Stengthening;Both;10 reps;Knee straight  2 lbs each ankle   Forward Lunges Both;Other (comment)  3 reps each in // bars   Hip Abduction Stengthening;Both;10 reps  2 lbs each ankle   Hip Extension Stengthening;Both;10 reps  2 lbs each ankle   Knee/Hip Exercises: Supine   Bridges Strengthening;10 reps   Bridges with Clamshell Strengthening;Both;2 sets;5 reps                        Long Term Clinic Goals - 03/04/15 1108    CC Long Term Goal  #1   Title independent with home exercise program for strengthening of  legs and hands   Status On-going   CC Long Term Goal  #3   Title be able to stand from sitting without use of upper extremities   Status On-going   CC Long Term Goal  #4   Title decrease quick DASH score to 50 or less   Status On-going   CC Long Term Goal  #5   Title increase hip strength to at least 3/5 for all motions   Status On-going            Plan - 03/04/15 1105    Clinical Impression Statement Pt did excellent today using machines again. She was able to tolerate increased reps and some increase weights. Forgot to retest strength but pt reports increased endurance with ADLs and fatigue is much improved at end of sessions this week than last week. Pt is progressing well.    Pt will benefit from skilled therapeutic intervention in order to improve on the following deficits Decreased strength;Abnormal gait;Decreased balance;Pain;Postural dysfunction;Decreased mobility   Rehab Potential Good   Clinical Impairments Affecting Rehab Potential anticipating surgery soon (Aug. 13)   PT Frequency 2x / week   PT Duration 4 weeks   PT Treatment/Interventions ADLs/Self Care Home Management;Electrical Stimulation;Moist Heat;Contrast Bath;DME Instruction;Gait training;Stair training;Functional mobility training;Therapeutic exercise;Balance training;Neuromuscular re-education;Patient/family education;Manual techniques;Passive range of motion;Vestibular   PT Next Visit Plan Asess goals and cont LE strength.    PT Home Exercise Plan LE strength and pt to cont walking 5-10 minutes a few times/day.   Consulted and Agree with Plan of Care Patient        Problem List Patient Active Problem List   Diagnosis Date Noted  . UTI (urinary tract infection) 12/02/2014  . Chemotherapy induced nausea and vomiting 11/03/2014  . Anemia due to antineoplastic chemotherapy 11/03/2014  . Genetic testing 10/13/2014  . Family history of breast cancer   . Family history of pancreatic cancer   . Family  history of breast cancer in female   . Breast cancer of upper-inner quadrant of left female breast ([place]) 08/16/2014  . BP (high blood pressure) 04/17/2014  . NSTEMI (non-ST elevated myocardial infarction) ([place]) 03/16/2014  . 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 Chest pain 09/04/2012  . Essential hypertension, benign 09/04/2012  . Angina pectoris with documented spasm ([place]) 03/05/2012  . Coronary artery spasm ([place]) 03/05/2012  . Arthritis of knee, degenerative 12/04/2011    Otelia Limes, PTA 03/04/2015, 11:20 AM  [place], [place], [address] Phone: [phone]   Fax:  [phone]  Name: Mary Randall MRN: [id number] Date of Birth:  02/27/1955     

## 2015-03-05 ENCOUNTER — Ambulatory Visit: Payer: Federal, State, Local not specified - PPO | Admitting: Physical Therapy

## 2015-03-08 ENCOUNTER — Encounter: Payer: Self-pay | Admitting: Hematology

## 2015-03-08 ENCOUNTER — Telehealth: Payer: Self-pay | Admitting: *Deleted

## 2015-03-08 ENCOUNTER — Ambulatory Visit: Payer: Federal, State, Local not specified - PPO | Admitting: Physical Therapy

## 2015-03-08 DIAGNOSIS — R269 Unspecified abnormalities of gait and mobility: Secondary | ICD-10-CM

## 2015-03-08 DIAGNOSIS — R2689 Other abnormalities of gait and mobility: Secondary | ICD-10-CM

## 2015-03-08 DIAGNOSIS — R29898 Other symptoms and signs involving the musculoskeletal system: Secondary | ICD-10-CM | POA: Diagnosis not present

## 2015-03-08 NOTE — Telephone Encounter (Signed)
Pt called & left message regarding update letter to her employer & wanting to know if it was done.  Letter completed by Dr Burr Medico & pt notified to p/u at front desk tomorrow with [facility].  Pt expressed appreciation.

## 2015-03-08 NOTE — Therapy (Signed)
[place], [place], [address] Phone: [phone]   Fax:  [phone]  Physical Therapy Treatment  Patient Details  Name: Mary Randall MRN: [id number] Date of Birth: [birth year] Referring Provider: Dr. Truitt Merle  Encounter Date: 03/08/2015      PT End of Session - 03/08/15 1243    Visit Number 7   Number of Visits 9   Date for PT Re-Evaluation 03/19/15   PT Start Time 0948  18 mins. late for appt.   PT Stop Time 1018   PT Time Calculation (min) 30 min   Activity Tolerance Patient tolerated treatment well   Behavior During Therapy WFL for tasks assessed/performed      Past Medical History  Diagnosis Date  . Hypertension   . Angina effort ([place])   . NSTEMI (non-ST elevated myocardial infarction) ([place]) 03/16/2014    non-obs CAD, spasm seen  . Pneumonia 2009  . Arthritis     "right knee" (03/16/2014)  . 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 Breast cancer ([place]) 2016  . Family history of breast cancer   . Family history of pancreatic cancer   . Family history of breast cancer in female     Past Surgical History  Procedure Laterality Date  . Other surgical history  1991    Tubal pregnancy  . Cardiac catheterization  2010; 03/16/2014  . Tubal ligation  1981  . Left heart catheterization with coronary angiogram N/A 03/16/2014    Procedure: LEFT HEART CATHETERIZATION WITH CORONARY ANGIOGRAM;  Surgeon: Sherren Mocha, MD; Non-obs CAD, EF 55%  . Bunionectomy Bilateral   . Breast biopsy Left 08/06/14  . Portacath placement N/A 08/28/2014    Procedure: INSERTION PORT-A-CATH;  Surgeon: Autumn Messing III, MD;  Location: [facility];  Service: General;  Laterality: N/A;    There were no vitals filed for this visit.  Visit Diagnosis:  Gait abnormality  Impairment of balance  Weakness of both lower extremities      Subjective Assessment - 03/08/15 0949    Subjective Arthritis in my  right knee flared up the day before my last visit here last week.  One exercise flared it up so it's been stiff; I've got a brace on it right now.  Surgery got scheduled for a week from today; "I'm so afraid."  The seed gets put in Wednesday.  "Therapy is definitely helping.  I'm walking a whole lot better than I was when I first came in here."   Currently in Pain? No/denies            [facility] PT Assessment - 03/08/15 0001    Observation/Other Assessments   Quick DASH  47.73   Functional Tests   Functional tests Sit to Stand   Sit to Stand   Comments patient was able to stand from sitting without pushing with her hands; used hands out in front of her  She was convinced she would not be able to do this.   Strength   Right Hip Extension 3/5   Right Hip ABduction 3+/5   Right Hip ADduction 3/5   Left Hip Extension 3+/5   Left Hip ADduction 3/5   Right Ankle Inversion 3+/5   Left Ankle Inversion 3+/5              Quick Dash - 03/08/15 0001    Open a tight or new jar Severe difficulty   Do heavy household chores (wash  walls, wash floors) Severe difficulty   Carry a shopping bag or briefcase No difficulty   Wash your back Moderate difficulty   Use a knife to cut food Mild difficulty   Recreational activities in which you take some force or impact through your arm, shoulder, or hand (golf, hammering, tennis) Unable   During the past week, to what extent has your arm, shoulder or hand problem interfered with your normal social activities with family, friends, neighbors, or groups? Quite a bit   During the past week, to what extent has your arm, shoulder or hand problem limited your work or other regular daily activities Slightly   Arm, shoulder, or hand pain. Moderate   Tingling (pins and needles) in your arm, shoulder, or hand Moderate   Difficulty Sleeping No difficulty   DASH Score 47.73 %               [facility] Adult PT Treatment/Exercise - 03/08/15 0001    Knee/Hip  Exercises: Aerobic   Recumbent Bike 4 minutes at level 1   Knee/Hip Exercises: Standing   Other Standing Knee Exercises from mat raised to 22 inch height, hands across chest and sit to/from stand x 10; then with mat at 20 inches, 10 reps   Knee/Hip Exercises: Seated   Long Arc Quad Strengthening;Right;Left;10 reps;Weights   Long Arc Quad Weight 3 lbs.  isometric for right due to knee pain with isotonic   Other Seated Knee/Hip Exercises alternating marching 2 lbs. x 25 each; then 3 lbs. x 10 each side   Ankle Exercises: Seated   ABC's 1 rep  to the letter E on both sides                PT Education - 03/08/15 1016    Education provided Yes   Education Details Do full alphabet with each ankle once a day   Person(s) Educated Patient   Methods Explanation;Demonstration   Comprehension Verbalized understanding;Returned demonstration                [facility] Goals - 03/08/15 [phone]    CC Long Term Goal  #1   Title independent with home exercise program for strengthening of legs and hands   Status On-going   CC Long Term Goal  #2   Title Demonstrate safe gait using appropriate assistive device characterized by a normal base of support   Status On-going   CC Long Term Goal  #3   Title be able to stand from sitting without use of upper extremities   Status Achieved   CC Long Term Goal  #4   Title decrease quick DASH score to 50 or less   Baseline 47.73 on 03/08/15   Status Achieved   CC Long Term Goal  #5   Title increase hip strength to at least 3/5 for all motions   Status Achieved            Plan - 03/08/15 1244    Clinical Impression Statement This patient has done extremely well with progressing signficantly in a short time.  She has met 3 of 5 long term goals as of today, and has partially met two others.  She may or may not be here one more time this week (depending on how she feels after seed placement for her breast surgery), then will have  lumpectomy on 03/15/15.  She does want to continue therapy after that, but will have to get the okay from her surgeon as  to when she starts back.  New goals will need to be written at that time.   Pt will benefit from skilled therapeutic intervention in order to improve on the following deficits Decreased strength;Abnormal gait;Decreased balance;Pain;Postural dysfunction;Decreased mobility   Rehab Potential Good   Clinical Impairments Affecting Rehab Potential surgery scheduled for 03/16/15   PT Frequency 2x / week   PT Duration 4 weeks   PT Treatment/Interventions Therapeutic exercise;Manual techniques   PT Next Visit Plan continue strengthening; once she is through her surgery and okayed to start therapy again, updated goals will need to be written, as she has met 3 of 5 long term goals   PT Home Exercise Plan LE strength and pt to cont walking 5-10 minutes a few times/day.  Added alphabet with each ankle today.   Consulted and Agree with Plan of Care Patient        Problem List Patient Active Problem List   Diagnosis Date Noted  . UTI (urinary tract infection) 12/02/2014  . Chemotherapy induced nausea and vomiting 11/03/2014  . Anemia due to antineoplastic chemotherapy 11/03/2014  . Genetic testing 10/13/2014  . Family history of breast cancer   . Family history of pancreatic cancer   . Family history of breast cancer in female   . Breast cancer of upper-inner quadrant of left female breast ([place]) 08/16/2014  . BP (high blood pressure) 04/17/2014  . NSTEMI (non-ST elevated myocardial infarction) ([place]) 03/16/2014  . 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 Chest pain 09/04/2012  . Essential hypertension, benign 09/04/2012  . Angina pectoris with documented spasm ([place]) 03/05/2012  . Coronary artery spasm ([place]) 03/05/2012  . Arthritis of knee, degenerative 12/04/2011    SALISBURY,[name] 03/08/2015, 12:49 PM  [place], [place], [address] Phone: [phone]   Fax:  [phone]  Name: Mary Randall MRN: [id number] Date of Birth: 1955/10/26    Serafina Royals, PT 03/08/2015 12:49 PM

## 2015-03-10 ENCOUNTER — Ambulatory Visit
Admission: RE | Admit: 2015-03-10 | Discharge: 2015-03-10 | Disposition: A | Discharge status: Home / Self Care | Payer: Federal, State, Local not specified - PPO | Source: Ambulatory Visit | Attending: General Surgery | Admitting: General Surgery

## 2015-03-10 ENCOUNTER — Encounter (HOSPITAL_COMMUNITY)
Admission: RE | Admit: 2015-03-10 | Discharge: 2015-03-10 | Disposition: A | Discharge status: Home / Self Care | Payer: Federal, State, Local not specified - PPO | Source: Ambulatory Visit | Attending: General Surgery | Admitting: General Surgery

## 2015-03-10 ENCOUNTER — Encounter (HOSPITAL_COMMUNITY): Payer: Self-pay

## 2015-03-10 ENCOUNTER — Other Ambulatory Visit: Payer: Self-pay | Admitting: General Surgery

## 2015-03-10 DIAGNOSIS — C50912 Malignant neoplasm of unspecified site of left female breast: Secondary | ICD-10-CM

## 2015-03-10 DIAGNOSIS — I10 Essential (primary) hypertension: Secondary | ICD-10-CM | POA: Diagnosis not present

## 2015-03-10 DIAGNOSIS — Z01812 Encounter for preprocedural laboratory examination: Secondary | ICD-10-CM | POA: Insufficient documentation

## 2015-03-10 DIAGNOSIS — Z79899 Other long term (current) drug therapy: Secondary | ICD-10-CM | POA: Insufficient documentation

## 2015-03-10 DIAGNOSIS — Z01818 Encounter for other preprocedural examination: Secondary | ICD-10-CM | POA: Insufficient documentation

## 2015-03-10 DIAGNOSIS — I252 Old myocardial infarction: Secondary | ICD-10-CM | POA: Insufficient documentation

## 2015-03-10 LAB — CBC
HEMATOCRIT: 34.7 % — AB (ref 36.0–46.0)
HEMOGLOBIN: 11.3 g/dL — AB (ref 12.0–15.0)
MCH: 30.1 pg (ref 26.0–34.0)
MCHC: 32.6 g/dL (ref 30.0–36.0)
MCV: 92.5 fL (ref 78.0–100.0)
Platelets: 225 10*3/uL (ref 150–400)
RBC: 3.75 MIL/uL — AB (ref 3.87–5.11)
RDW: 14.4 % (ref 11.5–15.5)
WBC: 4.7 10*3/uL (ref 4.0–10.5)

## 2015-03-10 LAB — BASIC METABOLIC PANEL
Anion gap: 12 (ref 5–15)
BUN: 6 mg/dL (ref 6–20)
CHLORIDE: 106 mmol/L (ref 101–111)
CO2: 25 mmol/L (ref 22–32)
CREATININE: 1.02 mg/dL — AB (ref 0.44–1.00)
Calcium: 10.9 mg/dL — ABNORMAL HIGH (ref 8.9–10.3)
GFR calc Af Amer: >60 mL/min (ref >60)
GFR calc non Af Amer: 59 mL/min — ABNORMAL LOW (ref >60)
GLUCOSE: 102 mg/dL — AB (ref 65–99)
Potassium: 3.3 mmol/L — ABNORMAL LOW (ref 3.5–5.1)
Sodium: 143 mmol/L (ref 135–145)

## 2015-03-10 NOTE — Pre-Procedure Instructions (Addendum)
Mary Randall  03/10/2015      [facility] [address] - [place], [place] - [address] [place] [address] Phone: [phone] Fax: [phone]    Your procedure is scheduled on 03/15/15.  Report to [facility] Admitting at 630 A.M.  Call this number if you have problems the morning of surgery:  [phone]   Remember:  Do not eat food or drink liquids after midnight.  Take these medicines the morning of surgery with A SIP OF WATER amlodipine(norvasc), isosorbide(imdur), nitro if needed for chest pain  STOP all herbel meds, nsaids (aleve,naproxen,advil,ibuprofen) starting Today including aspirin, vitamins   Do not wear jewelry, make-up or nail polish.  Do not wear lotions, powders, or perfumes.  You may wear deodorant.  Do not shave 48 hours prior to surgery.  Men may shave face and neck.  Do not bring valuables to the hospital.  [facility] is not responsible for any belongings or valuables.  Contacts, dentures or bridgework may not be worn into surgery.  Leave your suitcase in the car.  After surgery it may be brought to your room.  For patients admitted to the hospital, discharge time will be determined by your treatment team.  Patients discharged the day of surgery will not be allowed to drive home.    Special Instructions: [place] - Preparing for Surgery  Before surgery, you can play an important role.  Because skin is not sterile, your skin needs to be as free of germs as possible.  You can reduce the number of germs on you skin by washing with CHG (chlorahexidine gluconate) soap before surgery.  CHG is an antiseptic cleaner which kills germs and bonds with the skin to continue killing germs even after washing.  Please DO NOT use if you have an allergy to CHG or antibacterial soaps.  If your skin becomes reddened/irritated stop using the CHG and inform your nurse when you arrive at Short Stay.  Do not shave  (including legs and underarms) for at least 48 hours prior to the first CHG shower.  You may shave your face.  Please follow these instructions carefully:   1.  Shower with CHG Soap the night before surgery and the morning of Surgery.  2.  If you choose to wash your hair, wash your hair first as usual with your normal shampoo.  3.  After you shampoo, rinse your hair and body thoroughly to remove the Shampoo.  4.  Use CHG as you would any other liquid soap.  You can apply chg directly  to the skin and wash gently with scrungie or a clean washcloth.  5.  Apply the CHG Soap to your body ONLY FROM THE NECK DOWN.  Do not use on open wounds or open sores.  Avoid contact with your eyes ears, mouth and genitals (private parts).  Wash genitals (private parts)       with your normal soap.  6.  Wash thoroughly, paying special attention to the area where your surgery will be performed.  7.  Thoroughly rinse your body with warm water from the neck down.  8.  DO NOT shower/wash with your normal soap after using and rinsing off the CHG Soap.  9.  Pat yourself dry with a clean towel.            10.  Wear clean pajamas.            11.  Place clean sheets on your bed the night of your first shower and do not sleep with pets.  Day of Surgery  Do not apply any lotions/deodorants the morning of surgery.  Please wear clean clothes to the hospital/surgery center.   Please read over the following fact sheets that you were given. Pain Booklet, Coughing and Deep Breathing and Surgical Site Infection Prevention

## 2015-03-11 ENCOUNTER — Telehealth: Payer: Self-pay

## 2015-03-11 ENCOUNTER — Other Ambulatory Visit: Payer: Self-pay | Admitting: General Surgery

## 2015-03-11 ENCOUNTER — Ambulatory Visit: Payer: Federal, State, Local not specified - PPO

## 2015-03-11 DIAGNOSIS — C50912 Malignant neoplasm of unspecified site of left female breast: Secondary | ICD-10-CM

## 2015-03-11 NOTE — Progress Notes (Signed)
Anesthesia Chart Review: Patient is a 60 year old female scheduled for left breast radioactive seed localized lumpectomy and left axillary LN dissection, possible removal of Port-a-cath on 03/15/15 by Dr. Marlou Starks. Seed implant date/time is not documented.  History includes non-smoker, HTN, NSTEMI 03/16/14 (non-obstructive CAD; felt to be related to vasospasm, started on Imdur and amlodipine), Prinzmetal angina, left breast cancer, Port-a-cath 08/28/14.  PCP is Dr. Suzanna Obey. HEM-ONC is Dr. Burr Medico, last visit 02/12/15. He felt it was okay to remove port when she undergoes mastectomy. He will consider getting a repeat echo after chemotherapy, but plans now are to undergo surgery. He will see her in about two weeks following surgery.  Cardiologist is Dr. Burt Knack, last visit 11/22/14. He was aware that mastectomy and radiation was planned after she completed 12 weeks of Taxol.   Medications include: amlodipine, Lipitor, HCTZ, irbesartan, Imdur, potassium.   03/10/15 EKG: NSR, non-specific ST/T wave abnormality. No significant change since last tracing 11/01/14.   Echo 08/24/2014: Study Conclusions: - Left ventricle: The cavity size was normal. Wall thickness was normal. Systolic function was normal. The estimated ejection fraction was in the range of 55% to 60%. - Left atrium: The atrium was mildly dilated.  Cardiac cath 03/16/2014: 1. Patent coronary arteries with mild diffuse nonobstructive disease. (LM with minor irregularities. LAD with minimal ostial stenosis. DIAG1 with minor irregularity. Divide with 50% ostial stenosis. Mid LAD after the DIAG2 has 40-50% stenosis. 20% mid CX. Patent OM branches and RCA/PDA/PLA).  2. Normal LV systolic function with normal LVEDP. LVEF 60-65%. --Recommendations: Somewhat unclear as to the etiology of this patient's fairly profound symptoms and EKG changes. 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 The patient had previously been on long-acting nitrates and  ran out of medication recently. It is also possible that she has some sort of GI pathology but difficult to explain her EKG changes. We'll start back on long-acting nitrate therapy.   11/01/14 1V CXR (as part of ABD acute w/ 1VCXR): Impression: A right-sided chest port is noted ending about the mid SVC.No acute cardiopulmonary process seen.   Preoperative labs noted.   She saw Dr. Burt Knack within the past four months, and he was aware that patient would need mastectomy in the upcoming few months. Last cath was one year ago. She remains on prescribed therapy. EKG appears stable. f no acute changes then I anticipate that she can proceed as planned.  George Hugh [facility] Short Stay Center/Anesthesiology Phone [phone] 03/11/2015 5:03 PM

## 2015-03-11 NOTE — Therapy (Signed)
[place], [place], [address] Phone: [phone]   Fax:  [phone]  Physical Therapy Treatment  Patient Details  Name: Mary Randall MRN: [id number] Date of Birth: [birth year] Referring Provider: Dr. Truitt Merle  Encounter Date: 03/08/2015    Past Medical History  Diagnosis Date  . Hypertension   . NSTEMI (non-ST elevated myocardial infarction) ([facility]) 03/16/2014    non-obs CAD, spasm seen  . Pneumonia 2009  . Arthritis     "right knee" (03/16/2014)  . 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 Breast cancer ([facility]) 2016  . Family history of breast cancer   . Family history of pancreatic cancer   . Family history of breast cancer in female   . Angina effort ([facility])     none recent    Past Surgical History  Procedure Laterality Date  . Other surgical history  1991    Tubal pregnancy  . Cardiac catheterization  2010; 03/16/2014  . Tubal ligation  1981  . Left heart catheterization with coronary angiogram N/A 03/16/2014    Procedure: LEFT HEART CATHETERIZATION WITH CORONARY ANGIOGRAM;  Surgeon: Sherren Mocha, MD; Non-obs CAD, EF 55%  . Bunionectomy Bilateral   . Breast biopsy Left 08/06/14  . Portacath placement N/A 08/28/2014    Procedure: INSERTION PORT-A-CATH;  Surgeon: Autumn Messing III, MD;  Location: [facility];  Service: General;  Laterality: N/A;    There were no vitals filed for this visit.  Visit Diagnosis:  Gait abnormality  Impairment of balance  Weakness of both lower extremities                                       Long Term Clinic Goals - 03/08/15 4970    CC Long Term Goal  #1   Title independent with home exercise program for strengthening of legs and hands   Status On-going   CC Long Term Goal  #2   Title Demonstrate safe gait using appropriate assistive device characterized by a normal base of support   Status On-going   CC Long  Term Goal  #3   Title be able to stand from sitting without use of upper extremities   Status Achieved   CC Long Term Goal  #4   Title decrease quick DASH score to 50 or less   Baseline 47.73 on 03/08/15   Status Achieved   CC Long Term Goal  #5   Title increase hip strength to at least 3/5 for all motions   Status Achieved            Problem List Patient Active Problem List   Diagnosis Date Noted  . UTI (urinary tract infection) 12/02/2014  . Chemotherapy induced nausea and vomiting 11/03/2014  . Anemia due to antineoplastic chemotherapy 11/03/2014  . Genetic testing 10/13/2014  . Family history of breast cancer   . Family history of pancreatic cancer   . Family history of breast cancer in female   . Breast cancer of upper-inner quadrant of left female breast ([facility]) 08/16/2014  . BP (high blood pressure) 04/17/2014  . NSTEMI (non-ST elevated myocardial infarction) ([facility]) 03/16/2014  . 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 Chest pain 09/04/2012  . Essential hypertension, benign 09/04/2012  . Angina pectoris with documented spasm ([facility]) 03/05/2012  . Coronary artery spasm ([facility]) 03/05/2012  . Arthritis of  knee, degenerative 12/04/2011    SALISBURY,[name] 03/11/2015, 5:11 PM  [place], [place], [address] Phone: [phone]   Fax:  [phone]  Name: Mary Randall MRN: [id number] Date of Birth: 02-05-1955    PHYSICAL THERAPY DISCHARGE SUMMARY  Visits from Start of Care: 7  Current functional level related to goals / functional outcomes: Goals partially met as noted above.   She did very well with progress during this course of treatment.   Remaining deficits: Still with LE weakness.   Education / Equipment: Home exercise program for strengthening.  Plan: Patient agrees to discharge.  Patient goals were partially met. Patient is being discharged due to the patient's request.  She will have  surgery next week to remove the breast tumor.  She would like to resume therapy with [place] after that, once okayed to do so.  Serafina Royals, PT 03/11/2015 5:11 PM ?????

## 2015-03-11 NOTE — Telephone Encounter (Signed)
Pt called and cancelled last scheduled visit today reporting she was feeling good for her surgery next week and wanted to DC for now. But does hope/plan to come back a few weeks after that.

## 2015-03-15 ENCOUNTER — Ambulatory Visit
Admission: RE | Admit: 2015-03-15 | Discharge: 2015-03-15 | Disposition: A | Discharge status: Home / Self Care | Payer: Federal, State, Local not specified - PPO | Source: Ambulatory Visit | Attending: General Surgery | Admitting: General Surgery

## 2015-03-15 ENCOUNTER — Encounter (HOSPITAL_COMMUNITY)
Admission: RE | Disposition: A | Discharge status: Home / Self Care | Payer: Self-pay | Source: Ambulatory Visit | Attending: General Surgery

## 2015-03-15 ENCOUNTER — Encounter (HOSPITAL_COMMUNITY): Payer: Self-pay | Admitting: Certified Registered Nurse Anesthetist

## 2015-03-15 ENCOUNTER — Ambulatory Visit (HOSPITAL_COMMUNITY)
Admission: RE | Admit: 2015-03-15 | Discharge: 2015-03-18 | Disposition: A | Discharge status: Home / Self Care | Payer: Federal, State, Local not specified - PPO | Source: Ambulatory Visit | Attending: General Surgery | Admitting: General Surgery

## 2015-03-15 ENCOUNTER — Ambulatory Visit (HOSPITAL_COMMUNITY): Payer: Federal, State, Local not specified - PPO | Admitting: Certified Registered Nurse Anesthetist

## 2015-03-15 ENCOUNTER — Ambulatory Visit (HOSPITAL_COMMUNITY): Payer: Federal, State, Local not specified - PPO | Admitting: Vascular Surgery

## 2015-03-15 DIAGNOSIS — Z170 Estrogen receptor positive status [ER+]: Secondary | ICD-10-CM | POA: Insufficient documentation

## 2015-03-15 DIAGNOSIS — I252 Old myocardial infarction: Secondary | ICD-10-CM | POA: Insufficient documentation

## 2015-03-15 DIAGNOSIS — Z452 Encounter for adjustment and management of vascular access device: Secondary | ICD-10-CM | POA: Diagnosis not present

## 2015-03-15 DIAGNOSIS — Z885 Allergy status to narcotic agent status: Secondary | ICD-10-CM | POA: Insufficient documentation

## 2015-03-15 DIAGNOSIS — N6012 Diffuse cystic mastopathy of left breast: Secondary | ICD-10-CM | POA: Insufficient documentation

## 2015-03-15 DIAGNOSIS — Z9221 Personal history of antineoplastic chemotherapy: Secondary | ICD-10-CM | POA: Insufficient documentation

## 2015-03-15 DIAGNOSIS — C50912 Malignant neoplasm of unspecified site of left female breast: Secondary | ICD-10-CM

## 2015-03-15 DIAGNOSIS — Z888 Allergy status to other drugs, medicaments and biological substances status: Secondary | ICD-10-CM | POA: Diagnosis not present

## 2015-03-15 DIAGNOSIS — C773 Secondary and unspecified malignant neoplasm of axilla and upper limb lymph nodes: Secondary | ICD-10-CM | POA: Insufficient documentation

## 2015-03-15 DIAGNOSIS — R928 Other abnormal and inconclusive findings on diagnostic imaging of breast: Secondary | ICD-10-CM

## 2015-03-15 DIAGNOSIS — M1990 Unspecified osteoarthritis, unspecified site: Secondary | ICD-10-CM | POA: Insufficient documentation

## 2015-03-15 DIAGNOSIS — I10 Essential (primary) hypertension: Secondary | ICD-10-CM | POA: Diagnosis not present

## 2015-03-15 DIAGNOSIS — C50212 Malignant neoplasm of upper-inner quadrant of left female breast: Secondary | ICD-10-CM | POA: Diagnosis not present

## 2015-03-15 HISTORY — PX: PORT-A-CATH REMOVAL: SHX5289

## 2015-03-15 HISTORY — PX: BREAST LUMPECTOMY WITH NEEDLE LOCALIZATION AND AXILLARY LYMPH NODE DISSECTION: SHX5758

## 2015-03-15 HISTORY — DX: Malignant neoplasm of unspecified site of left female breast: C50.912

## 2015-03-15 SURGERY — BREAST LUMPECTOMY WITH NEEDLE LOCALIZATION AND AXILLARY LYMPH NODE DISSECTION
Anesthesia: Regional | Site: Chest | Laterality: Right

## 2015-03-15 MED ORDER — DEXAMETHASONE SODIUM PHOSPHATE 4 MG/ML IJ SOLN
INTRAMUSCULAR | Status: AC
  Filled 2015-03-15: qty 2

## 2015-03-15 MED ORDER — MIDAZOLAM HCL 2 MG/2ML IJ SOLN
INTRAMUSCULAR | Status: AC
  Filled 2015-03-15: qty 2

## 2015-03-15 MED ORDER — FENTANYL CITRATE (PF) 100 MCG/2ML IJ SOLN
INTRAMUSCULAR | Status: DC | PRN
  Administered 2015-03-15 (×6): 50 ug via INTRAVENOUS

## 2015-03-15 MED ORDER — ONDANSETRON HCL 4 MG/2ML IJ SOLN
INTRAMUSCULAR | Status: AC
  Filled 2015-03-15: qty 2

## 2015-03-15 MED ORDER — PROPOFOL 10 MG/ML IV BOLUS
INTRAVENOUS | Status: DC | PRN
Start: 2015-03-15 — End: 2015-03-15
  Administered 2015-03-15: 40 mg via INTRAVENOUS
  Administered 2015-03-15: 160 mg via INTRAVENOUS

## 2015-03-15 MED ORDER — HEPARIN SODIUM (PORCINE) 5000 UNIT/ML IJ SOLN
5000.0000 [IU] | Freq: Three times a day (TID) | INTRAMUSCULAR | Status: DC
  Administered 2015-03-16 – 2015-03-17 (×4): 5000 [IU] via SUBCUTANEOUS
  Filled 2015-03-15 (×4): qty 1

## 2015-03-15 MED ORDER — IRBESARTAN 300 MG PO TABS
300.0000 mg | ORAL_TABLET | Freq: Every day | ORAL | Status: DC
  Administered 2015-03-16 – 2015-03-18 (×3): 300 mg via ORAL
  Filled 2015-03-15 (×3): qty 1

## 2015-03-15 MED ORDER — FENTANYL CITRATE (PF) 100 MCG/2ML IJ SOLN: 25.0000 ug | INTRAMUSCULAR | Status: DC | PRN

## 2015-03-15 MED ORDER — BUPIVACAINE-EPINEPHRINE 0.25% -1:200000 IJ SOLN
INTRAMUSCULAR | Status: DC | PRN
  Administered 2015-03-15: 30 mL

## 2015-03-15 MED ORDER — BUPIVACAINE-EPINEPHRINE (PF) 0.5% -1:200000 IJ SOLN
INTRAMUSCULAR | Status: DC | PRN
  Administered 2015-03-15: 25 mL

## 2015-03-15 MED ORDER — ONDANSETRON HCL 4 MG/2ML IJ SOLN
4.0000 mg | Freq: Four times a day (QID) | INTRAMUSCULAR | Status: DC | PRN
Start: 2015-03-15 — End: 2015-03-18
  Administered 2015-03-15 – 2015-03-17 (×2): 4 mg via INTRAVENOUS
  Filled 2015-03-15 (×3): qty 2

## 2015-03-15 MED ORDER — LACTATED RINGERS IV SOLN
INTRAVENOUS | Status: DC | PRN
  Administered 2015-03-15 (×2): via INTRAVENOUS

## 2015-03-15 MED ORDER — MEPERIDINE HCL 25 MG/ML IJ SOLN: 6.2500 mg | INTRAMUSCULAR | Status: DC | PRN

## 2015-03-15 MED ORDER — CHLORHEXIDINE GLUCONATE 4 % EX LIQD: 1.0000 "application " | Freq: Once | CUTANEOUS | Status: DC

## 2015-03-15 MED ORDER — DEXAMETHASONE SODIUM PHOSPHATE 4 MG/ML IJ SOLN
8.0000 mg | Freq: Once | INTRAMUSCULAR | Status: AC
  Administered 2015-03-15: 8 mg via INTRAVENOUS

## 2015-03-15 MED ORDER — NEOSTIGMINE METHYLSULFATE 10 MG/10ML IV SOLN
INTRAVENOUS | Status: DC | PRN
  Administered 2015-03-15: 2 mg via INTRAVENOUS

## 2015-03-15 MED ORDER — CEFAZOLIN SODIUM-DEXTROSE 2-3 GM-% IV SOLR
INTRAVENOUS | Status: AC
  Filled 2015-03-15: qty 50

## 2015-03-15 MED ORDER — ONDANSETRON 4 MG PO TBDP
4.0000 mg | ORAL_TABLET | Freq: Four times a day (QID) | ORAL | Status: DC | PRN
  Administered 2015-03-17 (×2): 4 mg via ORAL
  Filled 2015-03-15 (×2): qty 1

## 2015-03-15 MED ORDER — AMLODIPINE BESYLATE 10 MG PO TABS
10.0000 mg | ORAL_TABLET | Freq: Every day | ORAL | Status: DC
  Administered 2015-03-16 – 2015-03-18 (×3): 10 mg via ORAL
  Filled 2015-03-15 (×3): qty 1

## 2015-03-15 MED ORDER — CEFAZOLIN SODIUM-DEXTROSE 2-3 GM-% IV SOLR
2.0000 g | INTRAVENOUS | Status: AC
  Administered 2015-03-15: 2 g via INTRAVENOUS

## 2015-03-15 MED ORDER — PHENYLEPHRINE HCL 10 MG/ML IJ SOLN
10.0000 mg | INTRAVENOUS | Status: DC | PRN
  Administered 2015-03-15: 20 ug/min via INTRAVENOUS

## 2015-03-15 MED ORDER — ROCURONIUM BROMIDE 50 MG/5ML IV SOLN
INTRAVENOUS | Status: AC
  Filled 2015-03-15: qty 1

## 2015-03-15 MED ORDER — PANTOPRAZOLE SODIUM 40 MG IV SOLR
40.0000 mg | Freq: Every day | INTRAVENOUS | Status: DC
Start: 2015-03-15 — End: 2015-03-17
  Administered 2015-03-15 – 2015-03-16 (×2): 40 mg via INTRAVENOUS
  Filled 2015-03-15 (×2): qty 40

## 2015-03-15 MED ORDER — FENTANYL CITRATE (PF) 250 MCG/5ML IJ SOLN
INTRAMUSCULAR | Status: AC
  Filled 2015-03-15: qty 5

## 2015-03-15 MED ORDER — GLYCOPYRROLATE 0.2 MG/ML IJ SOLN
INTRAMUSCULAR | Status: AC
  Filled 2015-03-15: qty 3

## 2015-03-15 MED ORDER — HYDROCHLOROTHIAZIDE 25 MG PO TABS
25.0000 mg | ORAL_TABLET | Freq: Every day | ORAL | Status: DC
  Administered 2015-03-16 – 2015-03-18 (×3): 25 mg via ORAL
  Filled 2015-03-15 (×3): qty 1

## 2015-03-15 MED ORDER — PHENYLEPHRINE HCL 10 MG/ML IJ SOLN
INTRAMUSCULAR | Status: DC | PRN
  Administered 2015-03-15 (×3): 80 ug via INTRAVENOUS
  Administered 2015-03-15: 40 ug via INTRAVENOUS
  Administered 2015-03-15 (×2): 80 ug via INTRAVENOUS
  Administered 2015-03-15: 40 ug via INTRAVENOUS
  Administered 2015-03-15: 80 ug via INTRAVENOUS
  Administered 2015-03-15: 40 ug via INTRAVENOUS

## 2015-03-15 MED ORDER — BUPIVACAINE-EPINEPHRINE (PF) 0.25% -1:200000 IJ SOLN
INTRAMUSCULAR | Status: AC
  Filled 2015-03-15: qty 30

## 2015-03-15 MED ORDER — POTASSIUM CHLORIDE ER 10 MEQ PO TBCR
10.0000 meq | EXTENDED_RELEASE_TABLET | Freq: Every day | ORAL | Status: DC
  Administered 2015-03-16 – 2015-03-18 (×3): 10 meq via ORAL
  Filled 2015-03-15 (×6): qty 1

## 2015-03-15 MED ORDER — NEOSTIGMINE METHYLSULFATE 10 MG/10ML IV SOLN
INTRAVENOUS | Status: AC
  Filled 2015-03-15: qty 5

## 2015-03-15 MED ORDER — LIDOCAINE HCL (CARDIAC) 20 MG/ML IV SOLN
INTRAVENOUS | Status: DC | PRN
  Administered 2015-03-15: 100 mg via INTRAVENOUS

## 2015-03-15 MED ORDER — GLYCOPYRROLATE 0.2 MG/ML IJ SOLN
INTRAMUSCULAR | Status: DC | PRN
  Administered 2015-03-15: 0.3 mg via INTRAVENOUS

## 2015-03-15 MED ORDER — DEXAMETHASONE SODIUM PHOSPHATE 4 MG/ML IJ SOLN
INTRAMUSCULAR | Status: DC | PRN
  Administered 2015-03-15: 8 mg via INTRAVENOUS

## 2015-03-15 MED ORDER — LACTATED RINGERS IV SOLN: INTRAVENOUS | Status: DC

## 2015-03-15 MED ORDER — ISOSORBIDE MONONITRATE ER 30 MG PO TB24
30.0000 mg | ORAL_TABLET | Freq: Every day | ORAL | Status: DC
Start: 2015-03-16 — End: 2015-03-18
  Administered 2015-03-16 – 2015-03-18 (×3): 30 mg via ORAL
  Filled 2015-03-15 (×3): qty 1

## 2015-03-15 MED ORDER — LIDOCAINE HCL (CARDIAC) 20 MG/ML IV SOLN
INTRAVENOUS | Status: AC
  Filled 2015-03-15: qty 5

## 2015-03-15 MED ORDER — PROPOFOL 10 MG/ML IV BOLUS
INTRAVENOUS | Status: AC
  Filled 2015-03-15: qty 20

## 2015-03-15 MED ORDER — ONDANSETRON HCL 4 MG/2ML IJ SOLN
4.0000 mg | Freq: Once | INTRAMUSCULAR | Status: AC
  Administered 2015-03-15: 4 mg via INTRAVENOUS

## 2015-03-15 MED ORDER — ONDANSETRON HCL 4 MG/2ML IJ SOLN
INTRAMUSCULAR | Status: DC | PRN
  Administered 2015-03-15: 4 mg via INTRAVENOUS

## 2015-03-15 MED ORDER — 0.9 % SODIUM CHLORIDE (POUR BTL) OPTIME
TOPICAL | Status: DC | PRN
  Administered 2015-03-15: 1000 mL

## 2015-03-15 MED ORDER — KCL IN DEXTROSE-NACL 20-5-0.9 MEQ/L-%-% IV SOLN
INTRAVENOUS | Status: DC
  Administered 2015-03-16: 18:00:00 via INTRAVENOUS
  Filled 2015-03-15 (×4): qty 1000

## 2015-03-15 MED ORDER — METHOCARBAMOL 500 MG PO TABS
500.0000 mg | ORAL_TABLET | Freq: Four times a day (QID) | ORAL | Status: DC | PRN
  Administered 2015-03-16: 500 mg via ORAL
  Filled 2015-03-15: qty 1

## 2015-03-15 MED ORDER — PHENYLEPHRINE 40 MCG/ML (10ML) SYRINGE FOR IV PUSH (FOR BLOOD PRESSURE SUPPORT)
PREFILLED_SYRINGE | INTRAVENOUS | Status: AC
  Filled 2015-03-15: qty 10

## 2015-03-15 MED ORDER — ROCURONIUM BROMIDE 100 MG/10ML IV SOLN
INTRAVENOUS | Status: DC | PRN
  Administered 2015-03-15: 30 mg via INTRAVENOUS

## 2015-03-15 MED ORDER — ATORVASTATIN CALCIUM 40 MG PO TABS
40.0000 mg | ORAL_TABLET | Freq: Every day | ORAL | Status: DC
  Administered 2015-03-16 – 2015-03-17 (×2): 40 mg via ORAL
  Filled 2015-03-15 (×2): qty 1

## 2015-03-15 MED ORDER — FENTANYL CITRATE (PF) 100 MCG/2ML IJ SOLN
25.0000 ug | INTRAMUSCULAR | Status: DC | PRN
  Administered 2015-03-15: 50 ug via INTRAVENOUS

## 2015-03-15 MED ORDER — SUCCINYLCHOLINE CHLORIDE 20 MG/ML IJ SOLN
INTRAMUSCULAR | Status: AC
  Filled 2015-03-15: qty 1

## 2015-03-15 MED ORDER — FENTANYL CITRATE (PF) 100 MCG/2ML IJ SOLN
INTRAMUSCULAR | Status: AC
  Filled 2015-03-15: qty 2

## 2015-03-15 MED ORDER — NITROGLYCERIN 0.4 MG SL SUBL: 0.4000 mg | SUBLINGUAL_TABLET | SUBLINGUAL | Status: DC | PRN

## 2015-03-15 MED ORDER — MIDAZOLAM HCL 5 MG/5ML IJ SOLN
INTRAMUSCULAR | Status: DC | PRN
  Administered 2015-03-15 (×2): 1 mg via INTRAVENOUS

## 2015-03-15 MED ORDER — NEOSTIGMINE METHYLSULFATE 10 MG/10ML IV SOLN
INTRAVENOUS | Status: AC
  Filled 2015-03-15: qty 1

## 2015-03-15 MED ORDER — HYDROCODONE-ACETAMINOPHEN 5-325 MG PO TABS
1.0000 | ORAL_TABLET | ORAL | Status: DC | PRN
  Filled 2015-03-15: qty 1

## 2015-03-15 SURGICAL SUPPLY — 49 items
APPLIER CLIP 9.375 MED OPEN (MISCELLANEOUS) ×9
BINDER BREAST LRG (GAUZE/BANDAGES/DRESSINGS) IMPLANT
BINDER BREAST XLRG (GAUZE/BANDAGES/DRESSINGS) IMPLANT
BIOPATCH RED 1 DISK 7.0 (GAUZE/BANDAGES/DRESSINGS) ×3 IMPLANT
BLADE SURG 15 STRL LF DISP TIS (BLADE) ×2 IMPLANT
BLADE SURG 15 STRL SS (BLADE) ×1
CHLORAPREP W/TINT 10.5 ML (MISCELLANEOUS) IMPLANT
CHLORAPREP W/TINT 26ML (MISCELLANEOUS) ×3 IMPLANT
CLIP APPLIE 9.375 MED OPEN (MISCELLANEOUS) ×6 IMPLANT
CONT SPEC 4OZ CLIKSEAL STRL BL (MISCELLANEOUS) ×6 IMPLANT
COVER SURGICAL LIGHT HANDLE (MISCELLANEOUS) ×3 IMPLANT
DERMABOND ADVANCED (GAUZE/BANDAGES/DRESSINGS) ×1
DERMABOND ADVANCED .7 DNX12 (GAUZE/BANDAGES/DRESSINGS) ×2 IMPLANT
DEVICE DUBIN SPECIMEN MAMMOGRA (MISCELLANEOUS) ×3 IMPLANT
DRAIN CHANNEL 19F RND (DRAIN) ×3 IMPLANT
DRAPE CHEST BREAST 15X10 FENES (DRAPES) ×6 IMPLANT
DRAPE LAPAROTOMY T 98X78 PEDS (DRAPES) ×3 IMPLANT
DRAPE SURG 17X23 STRL (DRAPES) ×3 IMPLANT
DRAPE UTILITY XL STRL (DRAPES) ×6 IMPLANT
DRSG TEGADERM 4X4.75 (GAUZE/BANDAGES/DRESSINGS) ×3 IMPLANT
ELECT CAUTERY BLADE 6.4 (BLADE) ×3 IMPLANT
ELECT COATED BLADE 2.86 ST (ELECTRODE) ×3 IMPLANT
ELECT REM PT RETURN 9FT ADLT (ELECTROSURGICAL) ×3
ELECTRODE REM PT RTRN 9FT ADLT (ELECTROSURGICAL) ×2 IMPLANT
EVACUATOR SILICONE 100CC (DRAIN) ×3 IMPLANT
GAUZE SPONGE 4X4 16PLY XRAY LF (GAUZE/BANDAGES/DRESSINGS) ×3 IMPLANT
GLOVE BIO SURGEON STRL SZ7.5 (GLOVE) ×6 IMPLANT
GOWN STRL REUS W/ TWL LRG LVL3 (GOWN DISPOSABLE) ×4 IMPLANT
GOWN STRL REUS W/TWL LRG LVL3 (GOWN DISPOSABLE) ×2
KIT BASIN OR (CUSTOM PROCEDURE TRAY) ×3 IMPLANT
KIT MARKER MARGIN INK (KITS) IMPLANT
KIT ROOM TURNOVER OR (KITS) ×3 IMPLANT
LIQUID BAND (GAUZE/BANDAGES/DRESSINGS) ×3 IMPLANT
NEEDLE HYPO 25GX1X1/2 BEV (NEEDLE) ×3 IMPLANT
NS IRRIG 1000ML POUR BTL (IV SOLUTION) ×3 IMPLANT
PACK SURGICAL SETUP 50X90 (CUSTOM PROCEDURE TRAY) ×3 IMPLANT
PAD ARMBOARD 7.5X6 YLW CONV (MISCELLANEOUS) ×6 IMPLANT
PENCIL BUTTON HOLSTER BLD 10FT (ELECTRODE) ×3 IMPLANT
SPONGE LAP 18X18 X RAY DECT (DISPOSABLE) ×6 IMPLANT
SUT ETHILON 2 0 FS 18 (SUTURE) ×3 IMPLANT
SUT MNCRL AB 4-0 PS2 18 (SUTURE) ×9 IMPLANT
SUT VIC AB 3-0 SH 18 (SUTURE) ×6 IMPLANT
SUT VIC AB 3-0 SH 27 (SUTURE) ×2
SUT VIC AB 3-0 SH 27X BRD (SUTURE) ×4 IMPLANT
SYR CONTROL 10ML LL (SYRINGE) ×3 IMPLANT
TOWEL OR 17X24 6PK STRL BLUE (TOWEL DISPOSABLE) IMPLANT
TOWEL OR 17X26 10 PK STRL BLUE (TOWEL DISPOSABLE) ×3 IMPLANT
TUBE CONNECTING 12X1/4 (SUCTIONS) ×3 IMPLANT
YANKAUER SUCT BULB TIP NO VENT (SUCTIONS) ×3 IMPLANT

## 2015-03-15 NOTE — Interval H&P Note (Signed)
History and Physical Interval Note:  03/15/2015 8:27 AM  Mary Randall  has presented today for surgery, with the diagnosis of LEFT BREAST CANCER  The various methods of treatment have been discussed with the patient and family. After consideration of risks, benefits and other options for treatment, the patient has consented to  Procedure(s): BREAST LUMPECTOMY WITH BRACKETED NEEDLE LOCALIZATION AND AXILLARY LYMPH NODE DISSECTION (N/A) POSSIBLE REMOVAL PORT-A-CATH (N/A) as a surgical intervention .  The patient's history has been reviewed, patient examined, no change in status, stable for surgery.  I have reviewed the patient's chart and labs.  Questions were answered to the patient's satisfaction.     TOTH III,Jamel Dunton S

## 2015-03-15 NOTE — Anesthesia Postprocedure Evaluation (Signed)
Anesthesia Post Note  Patient: Mary Randall  Procedure(s) Performed: Procedure(s) (LRB): LEFT BREAST LUMPECTOMY WITH BRACKETED NEEDLE LOCALIZATION AND AXILLARY LYMPH NODE DISSECTION (Left) REMOVAL PORT-A-CATH (Right)  Patient location during evaluation: PACU Anesthesia Type: General and Regional Level of consciousness: awake and alert Pain management: pain level controlled Vital Signs Assessment: post-procedure vital signs reviewed and stable Respiratory status: spontaneous breathing, nonlabored ventilation, respiratory function stable and patient connected to nasal cannula oxygen Cardiovascular status: blood pressure returned to baseline and stable Postop Assessment: no signs of nausea or vomiting Anesthetic complications: no    Last Vitals:  Filed Vitals:   03/15/15 1230 03/15/15 1245  BP: 109/78 108/73  Pulse: 80 85  Temp:    Resp: 14 18    Last Pain:  Filed Vitals:   03/15/15 1246  PainSc: 0-No pain                 Montez Hageman

## 2015-03-15 NOTE — Anesthesia Procedure Notes (Addendum)
Anesthesia Regional Block:  Pectoralis block  Pre-Anesthetic Checklist: ,, timeout performed, Correct Patient, Correct Site, Correct Laterality, Correct Procedure, Correct Position, site marked, Risks and benefits discussed,  Surgical consent,  Pre-op evaluation,  At surgeon's request and post-op pain management  Laterality: Left  Prep: Maximum Sterile Barrier Precautions used and chloraprep       Needles:  Injection technique: Single-shot  Needle Type: Echogenic Stimulator Needle     Needle Length: 10cm 10 cm Needle Gauge: 21 and 21 G    Additional Needles:  Procedures: ultrasound guided (picture in chart) Pectoralis block Narrative:  Injection made incrementally with aspirations every 5 mL.  Performed by: Personally   Additional Notes: Risks, benefits and alternative to block explained extensively.  Patient tolerated procedure well, without complications.   Procedure Name: Intubation Date/Time: 03/15/2015 9:04 AM Performed by: Garrison Columbus T Pre-anesthesia Checklist: Patient identified, Emergency Drugs available, Suction available and Patient being monitored Patient Re-evaluated:Patient Re-evaluated prior to inductionOxygen Delivery Method: Circle system utilized Preoxygenation: Pre-oxygenation with 100% oxygen Intubation Type: IV induction Ventilation: Mask ventilation without difficulty Laryngoscope Size: Miller and 2 Grade View: Grade I Tube type: Oral Tube size: 7.0 mm Number of attempts: 1 Airway Equipment and Method: Stylet Placement Confirmation: ETT inserted through vocal cords under direct vision,  positive ETCO2 and breath sounds checked- equal and bilateral Secured at: 20 cm Tube secured with: Tape Dental Injury: Teeth and Oropharynx as per pre-operative assessment

## 2015-03-15 NOTE — H&P (Signed)
Mary Randall  Location: [facility] Patient #: [id number] DOB: 06-11-1955 Separated / Language: [name] / Race: Black or African American Female   History of Present Illness  Patient words: discuss br sx.  The patient is a 60 year old female who presents for a follow-up for Breast cancer. The patient is a 60 year old black female who originally presented with a 5 cm cancer in the central left breast with a positive axillary lymph node. She was treated with neoadjuvant chemotherapy and has tolerated the treatment well. Her cancer is now down to about 2.8 cm. Her axillary lymph nodes are normal in size and appearance now. She returns today for surgical planning. Her last chemotherapy treatment was on January 24.   Problem List/Past Medical [place], [place]; 02/09/2015 10:41 AM) PRIMARY CANCER OF UPPER INNER QUADRANT OF LEFT FEMALE BREAST (C50.212)  Other Problems  Arthritis High blood pressure  Past Surgical History  Breast Biopsy Left. Foot Surgery Bilateral. Oral Surgery  Diagnostic Studies History  Colonoscopy never Mammogram within last year Pap Smear 1-5 years ago  Allergies  Codeine Phosphate *ANALGESICS - OPIOID*  Medication History  Atorvastatin Calcium (40MG  Tablet, Oral) Active. AmLODIPine Besylate (10MG  Tablet, Oral) Active. Hydrochlorothiazide (25MG  Tablet, Oral) Active. Isosorbide Mononitrate ER (30MG  Tablet ER 24HR, Oral) Active. Irbesartan (300MG  Tablet, Oral) Active. Potassium Chloride ER (10MEQ Tablet ER, Oral) Active. Nitrostat (0.4MG  Tab Sublingual, Sublingual) Active. Medications Reconciled  Social History Alcohol use Occasional alcohol use. Caffeine use Carbonated beverages, Tea. No drug use Tobacco use Never smoker.  Family History Alcohol Abuse Father. Arthritis Family Members In General, Mother. Breast Cancer Brother. Cerebrovascular Accident Father. Colon Cancer Mother. Diabetes Mellitus  Mother. Heart Disease Father. Hypertension Family Members In General, Father, Mother. Malignant Neoplasm Of Pancreas Brother.  Pregnancy / Birth History Age at menarche 9 years. Gravida 2 Maternal age 86-25 Para 2    Review of Systems  General Not Present- Appetite Loss, Chills, Fatigue, Fever, Night Sweats, Weight Gain and Weight Loss. Skin Not Present- Change in Wart/Mole, Dryness, Hives, Jaundice, New Lesions, Non-Healing Wounds, Rash and Ulcer. HEENT Present- Wears glasses/contact lenses. Not Present- Earache, Hearing Loss, Hoarseness, Nose Bleed, Oral Ulcers, Ringing in the Ears, Seasonal Allergies, Sinus Pain, Sore Throat, Visual Disturbances and Yellow Eyes. Respiratory Present- Snoring. Not Present- Bloody sputum, Chronic Cough, Difficulty Breathing and Wheezing. Breast Present- Breast Mass. Not Present- Breast Pain, Nipple Discharge and Skin Changes. Cardiovascular Not Present- Chest Pain, Difficulty Breathing Lying Down, Leg Cramps, Palpitations, Rapid Heart Rate, Shortness of Breath and Swelling of Extremities. Gastrointestinal Not Present- Abdominal Pain, Bloating, Bloody Stool, Change in Bowel Habits, Chronic diarrhea, Constipation, Difficulty Swallowing, Excessive gas, Gets full quickly at meals, Hemorrhoids, Indigestion, Nausea, Rectal Pain and Vomiting. Female Genitourinary Not Present- Frequency, Nocturia, Painful Urination, Pelvic Pain and Urgency. Musculoskeletal Not Present- Back Pain, Joint Pain, Joint Stiffness, Muscle Pain, Muscle Weakness and Swelling of Extremities. Neurological Not Present- Decreased Memory, Fainting, Headaches, Numbness, Seizures, Tingling, Tremor, Trouble walking and Weakness. Psychiatric Not Present- Anxiety, Bipolar, Change in Sleep Pattern, Depression, Fearful and Frequent crying. Endocrine Not Present- Cold Intolerance, Excessive Hunger, Hair Changes, Heat Intolerance, Hot flashes and New Diabetes. Hematology Not Present- Easy  Bruising, Excessive bleeding, Gland problems, HIV and Persistent Infections.  Vitals  Weight: 178.25 lb Height: 66in Body Surface Area: 1.9 m Body Mass Index: 28.77 kg/m  BP: 100/64 (Sitting, Left Arm, Standard)       Physical Exam  General Mental Status-Alert. General Appearance-Consistent with stated age. Hydration-Well hydrated.  Voice-Normal.  Head and Neck Head-normocephalic, atraumatic with no lesions or palpable masses. Trachea-midline. Thyroid Gland Characteristics - normal size and consistency.  Eye Eyeball - Bilateral-Extraocular movements intact. Sclera/Conjunctiva - Bilateral-No scleral icterus.  Chest and Lung Exam Chest and lung exam reveals -quiet, even and easy respiratory effort with no use of accessory muscles and on auscultation, normal breath sounds, no adventitious sounds and normal vocal resonance. Inspection Chest Wall - Normal. Back - normal.  Breast Note: There is no palpable mass in either breast. There is no palpable axillary, supraclavicular, or cervical lymphadenopathy.   Cardiovascular Cardiovascular examination reveals -normal heart sounds, regular rate and rhythm with no murmurs and normal pedal pulses bilaterally.  Abdomen Inspection Inspection of the abdomen reveals - No Hernias. Skin - Scar - no surgical scars. Palpation/Percussion Palpation and Percussion of the abdomen reveal - Soft, Non Tender, No Rebound tenderness, No Rigidity (guarding) and No hepatosplenomegaly. Auscultation Auscultation of the abdomen reveals - Bowel sounds normal.  Neurologic Neurologic evaluation reveals -alert and oriented x 3 with no impairment of recent or remote memory. Mental Status-Normal.  Musculoskeletal Normal Exam - Left-Upper Extremity Strength Normal and Lower Extremity Strength Normal. Normal Exam - Right-Upper Extremity Strength Normal and Lower Extremity Strength Normal.  Lymphatic Head &  Neck  General Head & Neck Lymphatics: Bilateral - Description - Normal. Axillary  General Axillary Region: Bilateral - Description - Normal. Tenderness - Non Tender. Femoral & Inguinal  Generalized Femoral & Inguinal Lymphatics: Bilateral - Description - Normal. Tenderness - Non Tender.    Assessment & Plan  PRIMARY CANCER OF UPPER INNER QUADRANT OF LEFT FEMALE BREAST (C50.212) Impression: The patient originally had a 5 cm cancer in the central left breast with a positive axillary lymph node. She has responded well to neoadjuvant chemotherapy. I have talked to her today about the different options for treatment and at this point she favors breast conservation. She does not want to participate in the study so my recommendation would be for a left axillary lymph node dissection given her positive node. I have discussed with her in detail the risks and benefits of the operation as well as some of the technical aspects and she understands and wishes to proceed. I will plan for a left breast radioactive seed localized lumpectomy and left axillary lymph node dissection.I will also ask her oncologist of the port can be removed at the same time.    Signed by Luella Cook, MD

## 2015-03-15 NOTE — Anesthesia Preprocedure Evaluation (Addendum)
Anesthesia Evaluation  Patient identified by MRN, date of birth, ID band Patient awake    Reviewed: Allergy & Precautions, NPO status , Patient's Chart, lab work & pertinent test results  Airway Mallampati: II  TM Distance: >3 FB Neck ROM: Full    Dental no notable dental hx. (+) Dental Advisory Given   Pulmonary neg pulmonary ROS,    Pulmonary exam normal breath sounds clear to auscultation       Cardiovascular hypertension, Pt. on medications + CAD and + Past MI  Normal cardiovascular exam Rhythm:Regular Rate:Normal     Neuro/Psych negative neurological ROS  negative psych ROS   GI/Hepatic negative GI ROS, Neg liver ROS,   Endo/Other  negative endocrine ROS  Renal/GU negative Renal ROS  negative genitourinary   Musculoskeletal negative musculoskeletal ROS (+) Arthritis ,   Abdominal   Peds negative pediatric ROS (+)  Hematology negative hematology ROS (+)   Anesthesia Other Findings   Reproductive/Obstetrics negative OB ROS                          Anesthesia Physical Anesthesia Plan  ASA: III  Anesthesia Plan: General and Regional   Post-op Pain Management: GA combined w/ Regional for post-op pain   Induction: Intravenous  Airway Management Planned: Oral ETT  Additional Equipment:   Intra-op Plan:   Post-operative Plan: Extubation in OR  Informed Consent: I have reviewed the patients History and Physical, chart, labs and discussed the procedure including the risks, benefits and alternatives for the proposed anesthesia with the patient or authorized representative who has indicated his/her understanding and acceptance.   Dental advisory given  Plan Discussed with: CRNA, Anesthesiologist and Surgeon  Anesthesia Plan Comments:        Anesthesia Quick Evaluation

## 2015-03-15 NOTE — Addendum Note (Signed)
Addendum  created 03/15/15 1409 by Montez Hageman, MD   Modules edited: Orders, PRL Based Order Sets

## 2015-03-15 NOTE — Op Note (Signed)
03/15/2015  11:41 AM  PATIENT:  Mary Randall  60 y.o. female  PRE-OPERATIVE DIAGNOSIS:  LEFT BREAST CANCER  POST-OPERATIVE DIAGNOSIS:  LEFT BREAST CANCER  PROCEDURE:  Procedure(s): LEFT BREAST LUMPECTOMY WITH BRACKETED NEEDLE LOCALIZATION AND AXILLARY LYMPH NODE DISSECTION (Left) REMOVAL PORT-A-CATH (Right)  SURGEON:  Surgeon(s) and Role:    * Autumn Messing III, MD - Primary  PHYSICIAN ASSISTANT:   ASSISTANTS: none   ANESTHESIA:   general  EBL:  Total I/O In: 1300 [I.V.:1300] Out: 50 [Blood:50]  BLOOD ADMINISTERED:none  DRAINS: (1) Jackson-Pratt drain(s) with closed bulb suction in the left axilla   LOCAL MEDICATIONS USED:  MARCAINE     SPECIMEN:  Source of Specimen:  left breast tissue with additional superior and lateral margin and left axillary contents  DISPOSITION OF SPECIMEN:  PATHOLOGY  COUNTS:  YES  TOURNIQUET:  * No tourniquets in log *  DICTATION: .Dragon Dictation   After informed consent was obtained patient was brought to the operating room and placed in the supine position on the operating room table. After adequate induction of general anesthesia the patient's bilateral chest, breast, and axillary areas were prepped with ChloraPrep, allowed to dry, and draped in usual sterile manner. An appropriate timeout was performed. Attention was first turned to the right chest wall. The area around the port was infiltrated with quarter percent Marcaine. A small incision was made with a 15 blade knife through her old incision. The dissection was carried through the subcutaneous tissue sharply with the 15 blade knife until the port was identified. The 2 anchoring stitches were divided and removed. The port was then pushed out of its pocket and with gentle traction was removed from the patient. Pressure was held for several minutes until the area was completely hemostatic. The deep layer of the wound was then closed with interrupted 3-0 Vicryl stitches. The skin was then closed  with a running 4-0 Monocryl subcuticular stitch. Attention was then turned to the left breast. Earlier in the day 2 wires were placed medially in the left breast to bracket an area of invasive breast cancer. An elliptical incision was made overlying the path of the wires. The incision was carried through the skin and subcutaneous tissue sharply with electrocautery. The path of the wires could be palpated. A circular portion of breast tissue was excised sharply around the path of the wires. This dissection was carried all the way to the chest wall. The specimen was then oriented with the appropriate paint colors. Once the specimen was removed a specimen radiograph was obtained that showed the wires and counts to be within the specimen. I did think we might be a little close on the superior and lateral margin so additional margins were taken sharply and marked with the appropriate Paint colors. The specimens were then sent to pathology for further evaluation. The wound was irrigated with saline and infiltrated with quarter percent Marcaine. The deep layer of the wound was then closed with layers of interrupted 3-0 Vicryl stitches. The skin was then closed with interrupted 4-0 Monocryl subcuticular stitches. Attention was then turned to the left axilla. A transversely oriented incision was made with a 15 blade knife overlying the low axilla. The incision was carried through the skin and subcutaneous tissue sharply with electrocautery until the chest wall And serratus muscle was identified. The dissection was then carried superiorly along the anterior chest wall. Blunt finger and right angle dissection was then carried out until we were able to identify the  left subclavian vein. The dissection was also carried posteriorly until the latissimus muscle was identified. The contents within the boundaries of the axilla Were then bluntly removed by right angle dissection. Several small vessels and intercostal brachial nerves  were controlled with clips and divided. The long thoracic and thoracodorsal nerves were identified and spared. Once the contents were removed so they were sent to pathology for further evaluation. The area was examined and found to be hemostatic. A small stab incision was made near the anterior axillary line inferior to the operative bed with a 15 blade knife. A tonsil clamp was placed through this opening and used to bring a 19 [place] round Blake drain into the operative bed. The drain was anchored to the skin with a 3-0 nylon stitch. The wound was irrigated with copious amounts of saline. The deep layer of the wound was then closed with interrupted 3-0 Vicryl stitches. The skin was then closed with a running 4-0 Monocryl subcuticular stitch. Dermabond dressings were then applied. The drain was placed to bulb suction and there was a good seal. The patient tolerated the procedure well. At the end of the case all needle sponge and instrument counts were correct. The patient was then awakened and taken to recovery in stable condition.  PLAN OF CARE: Admit for overnight observation  PATIENT DISPOSITION:  PACU - hemodynamically stable.   Delay start of Pharmacological VTE agent (>24hrs) due to surgical blood loss or risk of bleeding: no

## 2015-03-15 NOTE — Transfer of Care (Signed)
Immediate Anesthesia Transfer of Care Note  Patient: Mary Randall  Procedure(s) Performed: Procedure(s): LEFT BREAST LUMPECTOMY WITH BRACKETED NEEDLE LOCALIZATION AND AXILLARY LYMPH NODE DISSECTION (Left) REMOVAL PORT-A-CATH (Right)  Patient Location: PACU  Anesthesia Type:General and Regional  Level of Consciousness: awake and alert   Airway & Oxygen Therapy: Patient Spontanous Breathing and Patient connected to nasal cannula oxygen  Post-op Assessment: Report given to RN, Post -op Vital signs reviewed and stable and Patient moving all extremities X 4  Post vital signs: Reviewed and stable  Last Vitals:  Filed Vitals:   03/15/15 0822 03/15/15 1137  BP: 110/72 112/75  Pulse: 115 87  Temp: 37 C   Resp: 20 8    Complications: No apparent anesthesia complications

## 2015-03-16 ENCOUNTER — Encounter (HOSPITAL_COMMUNITY): Payer: Self-pay | Admitting: General Surgery

## 2015-03-16 DIAGNOSIS — C50212 Malignant neoplasm of upper-inner quadrant of left female breast: Secondary | ICD-10-CM | POA: Diagnosis not present

## 2015-03-16 MED ORDER — TRAMADOL HCL 50 MG PO TABS
25.0000 mg | ORAL_TABLET | ORAL | Status: DC | PRN
  Administered 2015-03-16 (×2): 50 mg via ORAL
  Filled 2015-03-16 (×2): qty 1

## 2015-03-16 NOTE — Progress Notes (Signed)
1 Day Post-Op  Subjective: Complains of pain in axilla  Objective: Vital signs in last 24 hours: Temp:  [97.7 F (36.5 C)-98.3 F (36.8 C)] 98.1 F (36.7 C) (03/14 0626) Pulse Rate:  [78-94] 78 (03/14 0626) Resp:  [8-23] 18 (03/14 0626) BP: (105-126)/(69-89) 126/89 mmHg (03/14 0626) SpO2:  [98 %-100 %] 98 % (03/14 0626)    Intake/Output from previous day: 03/13 0701 - 03/14 0700 In: 1300 [I.V.:1300] Out: 787 [Urine:700; Drains:37; Blood:50] Intake/Output this shift:    Resp: clear to auscultation bilaterally Breasts: incision looks good. drain output serosanguinous Cardio: regular rate and rhythm GI: soft, non-tender; bowel sounds normal; no masses,  no organomegaly  Lab Results:  No results for input(s): WBC, HGB, HCT, PLT in the last 72 hours. BMET No results for input(s): NA, K, CL, CO2, GLUCOSE, BUN, CREATININE, CALCIUM in the last 72 hours. PT/INR No results for input(s): LABPROT, INR in the last 72 hours. ABG No results for input(s): PHART, HCO3 in the last 72 hours.  Invalid input(s): PCO2, PO2  Studies/Results: Mm Breast Surgical Specimen  03/15/2015  CLINICAL DATA:  Specimen radiograph status post left breast lumpectomy. EXAM: SPECIMEN RADIOGRAPH OF THE LEFT BREAST COMPARISON:  Previous exam(s). FINDINGS: Status post excision of the left breast. The wire tips, calcifications and biopsy marker clip are present and are marked for pathology. The findings were communicated with the OR at 10 a.m. IMPRESSION: Specimen radiograph of the left breast. Electronically Signed   By: Ammie Ferrier M.D.   On: 03/15/2015 10:09   Mm Lt Plc Breast Loc Dev   1st Lesion  Inc Mammo Guide  03/15/2015  CLINICAL DATA:  60 year old female presenting for left breast wire localization prior to lumpectomy. EXAM: NEEDLE LOCALIZATION OF THE LEFT BREAST WITH MAMMO GUIDANCE COMPARISON:  Previous exams. FINDINGS: Patient presents for needle localization prior to lumpectomy. I met with the  patient and we discussed the procedure of needle localization including benefits and alternatives. We discussed the high likelihood of a successful procedure. We discussed the risks of the procedure, including infection, bleeding, tissue injury, and further surgery. Informed, written consent was given. The usual time-out protocol was performed immediately prior to the procedure. Using mammographic guidance, sterile technique, 1% lidocaine and a 7 cm Modified Kopans needle, the calcifications in the medial left breast middle depth were localized using medial approach. Next, using mammographic guidance, sterile technique, 1% lidocaine and a 5 cm modified Kopans needle, the ribbon shaped biopsy marking clip in the medial left breast anterior depth was localized using medial approach. The images were marked for Dr. Marlou Starks. IMPRESSION: Bracketed needle localization of the left breast. No apparent complications. Electronically Signed   By: Ammie Ferrier M.D.   On: 03/15/2015 09:25   Mm Lt Plc Breast Loc Dev   Ea Add Lesion  Inc Mammo Guide  03/15/2015  CLINICAL DATA:  60 year old female presenting for left breast wire localization prior to lumpectomy. EXAM: NEEDLE LOCALIZATION OF THE LEFT BREAST WITH MAMMO GUIDANCE COMPARISON:  Previous exams. FINDINGS: Patient presents for needle localization prior to lumpectomy. I met with the patient and we discussed the procedure of needle localization including benefits and alternatives. We discussed the high likelihood of a successful procedure. We discussed the risks of the procedure, including infection, bleeding, tissue injury, and further surgery. Informed, written consent was given. The usual time-out protocol was performed immediately prior to the procedure. Using mammographic guidance, sterile technique, 1% lidocaine and a 7 cm Modified Kopans needle, the calcifications  in the medial left breast middle depth were localized using medial approach. Next, using mammographic  guidance, sterile technique, 1% lidocaine and a 5 cm modified Kopans needle, the ribbon shaped biopsy marking clip in the medial left breast anterior depth was localized using medial approach. The images were marked for Dr. Marlou Starks. IMPRESSION: Bracketed needle localization of the left breast. No apparent complications. Electronically Signed   By: Ammie Ferrier M.D.   On: 03/15/2015 09:25    Anti-infectives: Anti-infectives    Start     Dose/Rate Route Frequency Ordered Stop   03/15/15 0845  ceFAZolin (ANCEF) IVPB 2 g/50 mL premix     2 g 100 mL/hr over 30 Minutes Intravenous To ShortStay Surgical 03/15/15 0831 03/15/15 0910   03/15/15 0836  ceFAZolin (ANCEF) 2-3 GM-% IVPB SOLR    Comments:  Ronnald Ramp, Tomika   : cabinet override      03/15/15 0836 03/15/15 2044      Assessment/Plan: s/p Procedure(s): LEFT BREAST LUMPECTOMY WITH BRACKETED NEEDLE LOCALIZATION AND AXILLARY LYMPH NODE DISSECTION (Left) REMOVAL PORT-A-CATH (Right) Advance diet  Continue to work on pain control Ambulate  Teach pt drain care She will probably be ready for discharge tomorrow     TOTH III,Kasidee Voisin S 03/16/2015

## 2015-03-17 DIAGNOSIS — C50212 Malignant neoplasm of upper-inner quadrant of left female breast: Secondary | ICD-10-CM | POA: Diagnosis not present

## 2015-03-17 MED ORDER — PANTOPRAZOLE SODIUM 40 MG PO TBEC: 40.0000 mg | DELAYED_RELEASE_TABLET | Freq: Every day | ORAL | Status: DC

## 2015-03-17 MED ORDER — PANTOPRAZOLE SODIUM 40 MG PO TBEC
40.0000 mg | DELAYED_RELEASE_TABLET | Freq: Every day | ORAL | Status: DC
  Administered 2015-03-17: 40 mg via ORAL
  Filled 2015-03-17: qty 1

## 2015-03-17 MED ORDER — TRAMADOL HCL 50 MG PO TABS: 25.0000 mg | ORAL_TABLET | ORAL | Status: DC | PRN

## 2015-03-17 NOTE — Progress Notes (Signed)
Pt a/o, no c/o pain, pt c/o nausea at breakfast, PRN Zofran given as ordered, pt not able to eat breakfast, pt would like to stay and see how she does with lunch, pt stable

## 2015-03-17 NOTE — Discharge Summary (Signed)
Physician Discharge Summary  Patient ID: Mary Randall MRN: [id number] DOB/AGE: [birth year] 60 y.o.  Admit date: 03/15/2015 Discharge date: 03/17/2015  Admission Diagnoses:  Discharge Diagnoses:  Active Problems:   Breast cancer, female, left   Discharged Condition: good  Hospital Course: the pt underwent left lumpectomy and axillary lymph node dissection. She tolerated surgery well. On pod 1 her nausea seemed to be resolving and she was discharged home  Consults: None  Significant Diagnostic Studies: none  Treatments: surgery: as above  Discharge Exam: Blood pressure 120/87, pulse 77, temperature 98.3 F (36.8 C), temperature source Oral, resp. rate 18, height 5\' 7"  (1.702 m), weight 80.513 kg (177 lb 8 oz), SpO2 98 %. Resp: clear to auscultation bilaterally Breasts: incisions look good Cardio: regular rate and rhythm GI: soft, non-tender; bowel sounds normal; no masses,  no organomegaly  Disposition: 01-Home or Self Care  Discharge Instructions    Call MD for:  difficulty breathing, headache or visual disturbances    Complete by:  As directed      Call MD for:  extreme fatigue    Complete by:  As directed      Call MD for:  hives    Complete by:  As directed      Call MD for:  persistant dizziness or light-headedness    Complete by:  As directed      Call MD for:  persistant nausea and vomiting    Complete by:  As directed      Call MD for:  redness, tenderness, or signs of infection (pain, swelling, redness, odor or green/yellow discharge around incision site)    Complete by:  As directed      Call MD for:  severe uncontrolled pain    Complete by:  As directed      Call MD for:  temperature >100.4    Complete by:  As directed      Diet - low sodium heart healthy    Complete by:  As directed      Discharge instructions    Complete by:  As directed   Sponge bathe while drains are in. No overhead activity. Empty drain, record output, recharge bulb twice a day     Increase activity slowly    Complete by:  As directed      No wound care    Complete by:  As directed             Medication List    TAKE these medications        acyclovir 400 MG tablet  Commonly known as:  ZOVIRAX  Take 1 tablet (400 mg total) by mouth 3 (three) times daily.     amLODipine 10 MG tablet  Commonly known as:  NORVASC  Take 1 tablet (10 mg total) by mouth daily.     aprepitant 80 MG capsule  Commonly known as:  EMEND  Take 1 capsule (80 mg total) by mouth daily. Start the day after chemotherapy.     atorvastatin 40 MG tablet  Commonly known as:  LIPITOR  Take 1 tablet (40 mg total) by mouth daily at 6 PM.     dexamethasone 4 MG tablet  Commonly known as:  DECADRON  Take 2 pills once the day after chemotherapy and then 2 times a day for 2 days. Take with food     gabapentin 100 MG capsule  Commonly known as:  NEURONTIN  Take 1 capsule (100 mg total) by mouth 3 (  three) times daily.     hydrochlorothiazide 25 MG tablet  Commonly known as:  HYDRODIURIL  Take 25 mg by mouth daily.     irbesartan 300 MG tablet  Commonly known as:  AVAPRO  take 1 tablet by mouth once daily     isosorbide mononitrate 30 MG 24 hr tablet  Commonly known as:  IMDUR  Take 1 tablet (30 mg total) by mouth daily.     Lidocaine-Prilocaine (Bulk) 2.5-2.5 % Crea  Apply 5 mLs topically as needed. 1.5 hours before use with chemotherapy.Cover with plastic     nitroGLYCERIN 0.4 MG SL tablet  Commonly known as:  NITROSTAT  Place 1 tablet (0.4 mg total) under the tongue every 5 (five) minutes as needed for chest pain.     ondansetron 8 MG tablet  Commonly known as:  ZOFRAN  Take 1 tablet (8 mg total) by mouth 2 (two) times daily as needed for nausea or vomiting. Start on 3rd day after Chemotherapy     potassium chloride 10 MEQ tablet  Commonly known as:  K-DUR  Take 1 tablet (10 mEq total) by mouth daily.     promethazine 12.5 MG tablet  Commonly known as:  PHENERGAN  Take 1-2  tablets (12.5-25 mg total) by mouth every 6 (six) hours as needed for nausea or vomiting.     traMADol 50 MG tablet  Commonly known as:  ULTRAM  Take 0.5-1 tablets (25-50 mg total) by mouth every 4 (four) hours as needed for moderate pain or severe pain.         SignedJovita Kussmaul Randall 03/17/2015, 8:32 AM

## 2015-03-17 NOTE — Progress Notes (Signed)
Pt felt nauseous after lunch PRN Zofran given as ordered, pt not feeling up to being dc, MD notified, IV out and ok to leave out per MD

## 2015-03-17 NOTE — Progress Notes (Signed)
Patient noted to have brown, undigested food emesis about 250 mL after eating veggie soup, ham and cheese, yogurt.  Patient given earlier Zofran ODT prior to the vomitting.  Patient feeling good after the incident.  Family member states that patient wants to have a BM but was not able to go.  This RN provided warm Prune juice to sip to aid in her BM.  Will monitor pt.

## 2015-03-17 NOTE — Progress Notes (Signed)
2 Days Post-Op  Subjective: Nauseated this am but feeling better now  Objective: Vital signs in last 24 hours: Temp:  [98.3 F (36.8 C)-98.8 F (37.1 C)] 98.3 F (36.8 C) (03/15 0600) Pulse Rate:  [72-77] 77 (03/15 0600) Resp:  [18] 18 (03/15 0600) BP: (114-120)/(76-87) 120/87 mmHg (03/15 0600) SpO2:  [98 %-99 %] 98 % (03/15 0600) Last BM Date: 03/14/15  Intake/Output from previous day: 03/14 0701 - 03/15 0700 In: 364 [I.V.:364] Out: 125 [Drains:125] Intake/Output this shift:    Resp: clear to auscultation bilaterally Chest wall: incisions look good Cardio: regular rate and rhythm GI: soft, non-tender; bowel sounds normal; no masses,  no organomegaly  Lab Results:  No results for input(s): WBC, HGB, HCT, PLT in the last 72 hours. BMET No results for input(s): NA, K, CL, CO2, GLUCOSE, BUN, CREATININE, CALCIUM in the last 72 hours. PT/INR No results for input(s): LABPROT, INR in the last 72 hours. ABG No results for input(s): PHART, HCO3 in the last 72 hours.  Invalid input(s): PCO2, PO2  Studies/Results: Mm Breast Surgical Specimen  03/15/2015  CLINICAL DATA:  Specimen radiograph status post left breast lumpectomy. EXAM: SPECIMEN RADIOGRAPH OF THE LEFT BREAST COMPARISON:  Previous exam(s). FINDINGS: Status post excision of the left breast. The wire tips, calcifications and biopsy marker clip are present and are marked for pathology. The findings were communicated with the OR at 10 a.m. IMPRESSION: Specimen radiograph of the left breast. Electronically Signed   By: Ammie Ferrier M.D.   On: 03/15/2015 10:09    Anti-infectives: Anti-infectives    Start     Dose/Rate Route Frequency Ordered Stop   03/15/15 0845  ceFAZolin (ANCEF) IVPB 2 g/50 mL premix     2 g 100 mL/hr over 30 Minutes Intravenous To ShortStay Surgical 03/15/15 0831 03/15/15 0910   03/15/15 0836  ceFAZolin (ANCEF) 2-3 GM-% IVPB SOLR    Comments:  Ronnald Ramp, Tomika   : cabinet override      03/15/15 0836  03/15/15 2044      Assessment/Plan: s/p Procedure(s): LEFT BREAST LUMPECTOMY WITH BRACKETED NEEDLE LOCALIZATION AND AXILLARY LYMPH NODE DISSECTION (Left) REMOVAL PORT-A-CATH (Right) Advance diet  Discharge later today if she can tolerate breakfast     TOTH III,PAUL S 03/17/2015

## 2015-03-18 DIAGNOSIS — C50212 Malignant neoplasm of upper-inner quadrant of left female breast: Secondary | ICD-10-CM | POA: Diagnosis not present

## 2015-03-18 NOTE — Progress Notes (Signed)
[name] to be D/C'd  per MD order. Discussed with the patient and all questions fully answered.  VSS, Skin clean, dry and intact without evidence of skin break down, no evidence of skin tears noted.  IV catheter discontinued intact. Site without signs and symptoms of complications. Dressing and pressure applied.  An After Visit Summary was printed and given to the patient. Patient received prescription.  D/c education completed with patient/family including follow up instructions, medication list, d/c activities limitations if indicated, with other d/c instructions as indicated by MD - patient able to verbalize understanding, all questions fully answered.   Patient instructed to return to ED, call 911, or call MD for any changes in condition.   Patient to be escorted via [place], and D/C home via private auto.

## 2015-03-18 NOTE — Progress Notes (Signed)
3 Days Post-Op  Subjective: She did not go home yesterday because of nausea. She feels better today  Objective: Vital signs in last 24 hours: Temp:  [97.2 F (36.2 C)-98.4 F (36.9 C)] 98.4 F (36.9 C) (03/16 0516) Pulse Rate:  [79-89] 79 (03/16 0516) Resp:  [18] 18 (03/16 0516) BP: (116-125)/(85-92) 125/92 mmHg (03/16 0516) SpO2:  [98 %-99 %] 98 % (03/16 0516) Last BM Date: 03/14/15  Intake/Output from previous day: 03/15 0701 - 03/16 0700 In: 360 [P.O.:360] Out: 240 [Emesis/NG output:200; Drains:40] Intake/Output this shift:    Resp: clear to auscultation bilaterally Breasts: incision looks good Cardio: regular rate and rhythm GI: soft, non-tender; bowel sounds normal; no masses,  no organomegaly  Lab Results:  No results for input(s): WBC, HGB, HCT, PLT in the last 72 hours. BMET No results for input(s): NA, K, CL, CO2, GLUCOSE, BUN, CREATININE, CALCIUM in the last 72 hours. PT/INR No results for input(s): LABPROT, INR in the last 72 hours. ABG No results for input(s): PHART, HCO3 in the last 72 hours.  Invalid input(s): PCO2, PO2  Studies/Results: No results found.  Anti-infectives: Anti-infectives    Start     Dose/Rate Route Frequency Ordered Stop   03/15/15 0845  ceFAZolin (ANCEF) IVPB 2 g/50 mL premix     2 g 100 mL/hr over 30 Minutes Intravenous To ShortStay Surgical 03/15/15 0831 03/15/15 0910   03/15/15 0836  ceFAZolin (ANCEF) 2-3 GM-% IVPB SOLR    Comments:  Ronnald Ramp, Tomika   : cabinet override      03/15/15 0836 03/15/15 2044      Assessment/Plan: s/p Procedure(s): LEFT BREAST LUMPECTOMY WITH BRACKETED NEEDLE LOCALIZATION AND AXILLARY LYMPH NODE DISSECTION (Left) REMOVAL PORT-A-CATH (Right) Advance diet Discharge     TOTH III,Xzavier Swinger S 03/18/2015

## 2015-03-22 ENCOUNTER — Telehealth: Payer: Self-pay | Admitting: Hematology

## 2015-03-22 NOTE — Telephone Encounter (Signed)
RETURNED CALL ADN R/S APPT....DONE....PTOK AND AWARE OF NEW D.T

## 2015-03-23 ENCOUNTER — Encounter: Payer: Self-pay | Admitting: Hematology

## 2015-03-23 NOTE — Progress Notes (Signed)
-  faxed to appropiate faxes and make copies -and let ready for pk up- shenique-no fax#-copy for her/ Shawntelle-no fax#-copy for her, [place]-there is an envelope-will confirm whose needs to be mailed there. All copies made for patient,medical records and file. Noted sharepoint

## 2015-03-23 NOTE — Progress Notes (Signed)
self, Mary Randall,sheneigue, left for dr. Burr Medico to sign

## 2015-03-23 NOTE — Progress Notes (Signed)
forms left in box-fmla

## 2015-03-24 ENCOUNTER — Encounter: Payer: Self-pay | Admitting: Hematology

## 2015-03-24 NOTE — Progress Notes (Signed)
I called and left message with Hubbard Robinson to call back with fax#. I spoke with [facility] and she will call me back with a fax# to fax. I spoke with patient and she said she thinks all should go to the same fax#. I let her know that there was a envelope and not sure whose should go in there. I advised her copies will be left at front for their copies.

## 2015-03-26 ENCOUNTER — Ambulatory Visit: Payer: Federal, State, Local not specified - PPO | Admitting: Hematology

## 2015-03-26 ENCOUNTER — Other Ambulatory Visit: Payer: Federal, State, Local not specified - PPO

## 2015-03-29 ENCOUNTER — Ambulatory Visit: Payer: Federal, State, Local not specified - PPO

## 2015-03-29 ENCOUNTER — Ambulatory Visit
Admission: RE | Admit: 2015-03-29 | Discharge: 2015-03-29 | Disposition: A | Discharge status: Home / Self Care | Payer: Federal, State, Local not specified - PPO | Source: Ambulatory Visit | Attending: Radiation Oncology | Admitting: Radiation Oncology

## 2015-03-29 DIAGNOSIS — Z510 Encounter for antineoplastic radiation therapy: Secondary | ICD-10-CM | POA: Insufficient documentation

## 2015-03-29 DIAGNOSIS — Z170 Estrogen receptor positive status [ER+]: Secondary | ICD-10-CM | POA: Insufficient documentation

## 2015-03-29 DIAGNOSIS — C50212 Malignant neoplasm of upper-inner quadrant of left female breast: Secondary | ICD-10-CM | POA: Insufficient documentation

## 2015-03-31 ENCOUNTER — Encounter: Payer: Self-pay | Admitting: Radiation Oncology

## 2015-03-31 ENCOUNTER — Ambulatory Visit
Admission: RE | Admit: 2015-03-31 | Discharge: 2015-03-31 | Disposition: A | Discharge status: Home / Self Care | Payer: Federal, State, Local not specified - PPO | Source: Ambulatory Visit | Attending: Radiation Oncology | Admitting: Radiation Oncology

## 2015-03-31 ENCOUNTER — Other Ambulatory Visit: Payer: Self-pay | Admitting: General Surgery

## 2015-03-31 VITALS — BP 109/81 | HR 107 | Temp 98.3°F | Ht 67.0 in | Wt 175.8 lb

## 2015-03-31 DIAGNOSIS — Z170 Estrogen receptor positive status [ER+]: Secondary | ICD-10-CM | POA: Diagnosis not present

## 2015-03-31 DIAGNOSIS — C50212 Malignant neoplasm of upper-inner quadrant of left female breast: Secondary | ICD-10-CM | POA: Diagnosis not present

## 2015-03-31 DIAGNOSIS — Z510 Encounter for antineoplastic radiation therapy: Secondary | ICD-10-CM | POA: Diagnosis not present

## 2015-03-31 NOTE — Addendum Note (Signed)
Encounter addended by: Ernst Spell, RN on: 03/31/2015  4:19 PM<BR>     Documentation filed: Charges VN

## 2015-03-31 NOTE — Progress Notes (Signed)
Location of Breast Cancer: Left Breast  Histology per Pathology Report:  03/15/15 Diagnosis 1. 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 - INTERMEDIATE GRADE DUCTAL CARCINOMA IN SITU. - DUCTAL CARCINOMA IN SITU IS FOCALLY 0.1 TO 0.2 CM TO SUPERIOR MARGIN. - DUCTAL CARCINOMA IN SITU IS FOCALLY 0.2 CM TO LATERAL MARGIN. - INVASIVE DUCTAL CARCINOMA INVOLVES LATERAL MARGIN. - PLEASE CORRELATE WITH LOCATION OF SPECIMENS NUMBER 2 (ADDITIONAL SUPERIOR MARGIN) AND NUMBER 3 (ADDITIONAL LATERAL MARGIN) FOR FINAL MARGIN STATUS. - INVASIVE DUCTAL CARCINOMA FOCALLY INVOLVES INFERIOR MARGIN. - OTHER MARGINS ARE NEGATIVE. - SEE ONCOLOGY TEMPLATE. 2. Breast, excision, Left additional superior margin Diagnosis(continued) - FOCAL INTERMEDIATE DUCTAL CARCINOMA IN SITU. - MARGIN IS NEGATIVE (TUMOR IS GREATER THAN 0.4 CM TO MARGIN). 3. Breast, excision, Left additional lateral margin - BENIGN BREAST PARENCHYMA WITH FIBROCYSTIC CHANGES. - NO TUMOR SEEN. 4. 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  Receptor Status: ER (100%), PR (5%), Her2-neu (NEG)    Past/Anticipated interventions by surgeon, if any: 03/15/15 PROCEDURE: Procedure(s): LEFT BREAST LUMPECTOMY WITH BRACKETED NEEDLE LOCALIZATION AND AXILLARY LYMPH NODE DISSECTION (Left) By Dr. Marlou Starks  She saw Dr. Marlou Starks this am and her drain was removed. He told her she may have to go back for another surgery because her cancer was not completely removed. The office will call her for the appointment, but she says it will be mid April.   Past/Anticipated interventions by medical oncology, if any:   09/03/2014 - 01/26/2015 Adjuvant Chemotherapy ddAC every 2 weeks X4, followed by weekly Taxol X12        She is scheduled to see Dr. Burr Medico 04/02/15 with labs before hand.   Lymphedema issues, if any:  No  Pain  issues, if any:  No  SAFETY ISSUES:  Prior radiation? No  Pacemaker/ICD? No  Possible current pregnancy? No  Is the patient on methotrexate? No  Current Complaints / other details:  She has no other concerns at this time.   BP 109/81 mmHg  Pulse 107  Temp(Src) 98.3 F (36.8 C)  Ht _0  (1.702 m)  Wt 175 lb 12.8 oz (79.742 kg)  BMI 27.53 kg/m2  SpO2 100%   Wt Readings from Last 3 Encounters:  03/31/15 175 lb 12.8 oz (79.742 kg)  03/15/15 177 lb 8 oz (80.513 kg)  03/10/15 177 lb 8 oz (80.513 kg)      Mary Randall, Stephani Police, RN 03/31/2015,2:56 PM

## 2015-03-31 NOTE — Progress Notes (Signed)
Radiation Oncology         [phone] ________________________________  Name: Mary Randall MRN: [id number]  Date: 03/31/2015  DOB: 10-04-1955  Follow-Up Visit Note  Outpatient  CC: Mary Obey, MD  Mary Kussmaul, MD  Diagnosis:      ICD-9-CM ICD-10-CM   1. 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     Since consultation, she underwent neoadjuvant systemic therapy in the form of ddAC every 2 weeks X4, followed by weekly Taxol X12. She has a clinical response on MRI.  Left lumpectomy and sentinel lymph node biopsy followed on 03-15-15 revealed grade 2 invasive ductal carcinoma, 2.0cm tumor, with a focally positive margin to invasive disease. One of 15 LN were positive (no ECE).  She will undergo reexcision of her positive margin in a couple weeks.   No complaints other than decreased ROM in left shoulder. No lymphedema.             ALLERGIES:  is allergic to carvedilol; codeine; percocet; adhesive; and compazine.  Meds: Current Outpatient Prescriptions  Medication Sig Dispense Refill  . amLODipine (NORVASC) 10 MG tablet Take 1 tablet (10 mg total) by mouth daily. 30 tablet 3  . atorvastatin (LIPITOR) 40 MG tablet Take 1 tablet (40 mg total) by mouth daily at 6 PM. 30 tablet 11  . hydrochlorothiazide (HYDRODIURIL) 25 MG tablet Take 25 mg by mouth daily.    . irbesartan (AVAPRO) 300 MG tablet take 1 tablet by mouth once daily 30 tablet 5  . isosorbide mononitrate (IMDUR) 30 MG 24 hr tablet Take 1 tablet (30 mg total) by mouth daily. 30 tablet 1  . nitroGLYCERIN (NITROSTAT) 0.4 MG SL tablet Place 1 tablet (0.4 mg total) under the tongue every 5 (five) minutes as needed for chest pain. 25 tablet 12  . potassium chloride (K-DUR) 10 MEQ tablet Take 1 tablet (10 mEq total) by mouth daily.  30 tablet 11  . traMADol (ULTRAM) 50 MG tablet Take 0.5-1 tablets (25-50 mg total) by mouth every 4 (four) hours as needed for moderate pain or severe pain. 30 tablet 1  . gabapentin (NEURONTIN) 100 MG capsule Take 1 capsule (100 mg total) by mouth 3 (three) times daily. (Patient not taking: Reported on 02/17/2015) 90 capsule 1  . ondansetron (ZOFRAN) 8 MG tablet Take 1 tablet (8 mg total) by mouth 2 (two) times daily as needed for nausea or vomiting. Start on 3rd day after Chemotherapy (Patient not taking: Reported on 02/17/2015) 30 tablet 1   No current facility-administered medications for this encounter.    Physical Findings:  height is '5\' 7"'$  (1.702 m) and weight is 175 lb 12.8 oz (79.742 kg). Her temperature is 98.3 F (36.8 C). Her blood pressure is 109/81 and her pulse is 107. Her oxygen saturation is 100%. .     General: Alert and oriented, in no acute distress HEENT: Head is normocephalic. alopecia. Neck: Neck is supple, no palpable cervical or supraclavicular lymphadenopathy. Heart: Regular in rate and rhythm with no murmurs, rubs, or gallops. Chest: Clear to auscultation bilaterally, with no rhonchi, wheezes, or rales. Extremities: No cyanosis or edema in UEs. Lymphatics: see Neck Exam Musculoskeletal: decreased ROM in left shoulder. Neurologic: No obvious focalities. Speech is fluent.  Psychiatric: Judgment and insight  are intact. Affect is appropriate. Breast exam reveals healing scar over inner left breast. Bandage over L axilla (drain pulled recently, she reports)  Lab Findings: Lab Results  Component Value Date   WBC 4.7 03/10/2015   HGB 11.3* 03/10/2015   HCT 34.7* 03/10/2015   MCV 92.5 03/10/2015   PLT 225 03/10/2015    CMP     Component Value Date/Time   NA 143 03/10/2015 1018   NA 141 02/12/2015 1011   K 3.3* 03/10/2015 1018   K 3.9 02/12/2015 1011   CL 106 03/10/2015 1018   CO2 25 03/10/2015 1018   CO2 25 02/12/2015 1011   GLUCOSE 102* 03/10/2015 1018    GLUCOSE 116 02/12/2015 1011   BUN 6 03/10/2015 1018   BUN 13.3 02/12/2015 1011   CREATININE 1.02* 03/10/2015 1018   CREATININE 1.0 02/12/2015 1011   CREATININE 1.08 03/27/2014 1153   CALCIUM 10.9* 03/10/2015 1018   CALCIUM 10.6* 02/12/2015 1011   PROT 6.3* 02/12/2015 1011   PROT 6.9 11/01/2014 1605   ALBUMIN 3.5 02/12/2015 1011   ALBUMIN 3.9 11/01/2014 1605   AST 15 02/12/2015 1011   AST 20 11/01/2014 1605   ALT 16 02/12/2015 1011   ALT 18 11/01/2014 1605   ALKPHOS 71 02/12/2015 1011   ALKPHOS 93 11/01/2014 1605   BILITOT 0.44 02/12/2015 1011   BILITOT 1.1 11/01/2014 1605   GFRNONAA 59* 03/10/2015 1018   GFRAA >60 03/10/2015 1018      Radiographic Findings: Mm Breast Surgical Specimen  03/15/2015  CLINICAL DATA:  Specimen radiograph status post left breast lumpectomy. EXAM: SPECIMEN RADIOGRAPH OF THE LEFT BREAST COMPARISON:  Previous exam(s). FINDINGS: Status post excision of the left breast. The wire tips, calcifications and biopsy marker clip are present and are marked for pathology. The findings were communicated with the OR at 10 a.m. IMPRESSION: Specimen radiograph of the left breast. Electronically Signed   By: Mary Randall M.D.   On: 03/15/2015 10:09   Mm Lt Plc Breast Loc Dev   1st Lesion  Inc Mammo Guide  03/15/2015  CLINICAL DATA:  60 year old female presenting for left breast wire localization prior to lumpectomy. EXAM: NEEDLE LOCALIZATION OF THE LEFT BREAST WITH MAMMO GUIDANCE COMPARISON:  Previous exams. FINDINGS: Patient presents for needle localization prior to lumpectomy. I met with the patient and we discussed the procedure of needle localization including benefits and alternatives. We discussed the high likelihood of a successful procedure. We discussed the risks of the procedure, including infection, bleeding, tissue injury, and further surgery. Informed, written consent was given. The usual time-out protocol was performed immediately prior to the procedure.  Using mammographic guidance, sterile technique, 1% lidocaine and a 7 cm Modified Kopans needle, the calcifications in the medial left breast middle depth were localized using medial approach. Next, using mammographic guidance, sterile technique, 1% lidocaine and a 5 cm modified Kopans needle, the ribbon shaped biopsy marking clip in the medial left breast anterior depth was localized using medial approach. The images were marked for Dr. Marlou Starks. IMPRESSION: Bracketed needle localization of the left breast. No apparent complications. Electronically Signed   By: Mary Randall M.D.   On: 03/15/2015 09:25   Mm Lt Plc Breast Loc Dev   Ea Add Lesion  Inc Mammo Guide  03/15/2015  CLINICAL DATA:  60 year old female presenting for left breast wire localization prior to lumpectomy. EXAM: NEEDLE LOCALIZATION OF THE LEFT BREAST WITH MAMMO GUIDANCE COMPARISON:  Previous exams. FINDINGS: Patient presents for needle localization prior  to lumpectomy. I met with the patient and we discussed the procedure of needle localization including benefits and alternatives. We discussed the high likelihood of a successful procedure. We discussed the risks of the procedure, including infection, bleeding, tissue injury, and further surgery. Informed, written consent was given. The usual time-out protocol was performed immediately prior to the procedure. Using mammographic guidance, sterile technique, 1% lidocaine and a 7 cm Modified Kopans needle, the calcifications in the medial left breast middle depth were localized using medial approach. Next, using mammographic guidance, sterile technique, 1% lidocaine and a 5 cm modified Kopans needle, the ribbon shaped biopsy marking clip in the medial left breast anterior depth was localized using medial approach. The images were marked for Dr. Marlou Starks. IMPRESSION: Bracketed needle localization of the left breast. No apparent complications. Electronically Signed   By: Mary Randall M.D.   On:  03/15/2015 09:25    Impression/Plan: L breast cancer We discussed adjuvant radiotherapy today.  I recommend adjuvant radiotherapy to her Left breast and regional nodes in order to reduce risk of locoregional recurrence by 2/3.  The risks, benefits and side effects of this treatment were discussed in detail.  She understands that radiotherapy is associated with skin irritation and fatigue in the acute setting. Late effects can include cosmetic changes and rare injury to internal organs.   She is enthusiastic about proceeding with treatment. A consent form has been  signed and placed in her chart.  She is pending reexcision to clear her margins.  I will await her referral back to me when released by Dr Marlou Starks for RT planning.  _____________________________________   Eppie Gibson, MD

## 2015-04-01 ENCOUNTER — Telehealth: Payer: Self-pay | Admitting: *Deleted

## 2015-04-01 NOTE — Telephone Encounter (Signed)
CALLED PATIENT TO INFORM OF PT APPT. ON 04-05-15- ARRIVAL TIME - 10 AM @ [facility], SPOKE WITH PATIENT AND SHE IS AWARE OF THIS APPT.

## 2015-04-02 ENCOUNTER — Ambulatory Visit (HOSPITAL_BASED_OUTPATIENT_CLINIC_OR_DEPARTMENT_OTHER): Payer: Federal, State, Local not specified - PPO | Admitting: Hematology

## 2015-04-02 ENCOUNTER — Other Ambulatory Visit (HOSPITAL_BASED_OUTPATIENT_CLINIC_OR_DEPARTMENT_OTHER): Payer: Federal, State, Local not specified - PPO

## 2015-04-02 ENCOUNTER — Encounter: Payer: Self-pay | Admitting: Hematology

## 2015-04-02 ENCOUNTER — Telehealth: Payer: Self-pay | Admitting: Hematology

## 2015-04-02 VITALS — BP 127/83 | HR 94 | Temp 98.5°F | Resp 18 | Ht 67.0 in | Wt 175.8 lb

## 2015-04-02 DIAGNOSIS — C50212 Malignant neoplasm of upper-inner quadrant of left female breast: Secondary | ICD-10-CM

## 2015-04-02 DIAGNOSIS — G620 Drug-induced polyneuropathy: Secondary | ICD-10-CM | POA: Diagnosis not present

## 2015-04-02 DIAGNOSIS — D6481 Anemia due to antineoplastic chemotherapy: Secondary | ICD-10-CM

## 2015-04-02 DIAGNOSIS — T451X5A Adverse effect of antineoplastic and immunosuppressive drugs, initial encounter: Secondary | ICD-10-CM

## 2015-04-02 LAB — CBC WITH DIFFERENTIAL/PLATELET
BASO%: 0.9 % (ref 0.0–2.0)
Basophils Absolute: 0 10*3/uL (ref 0.0–0.1)
EOS ABS: 0 10*3/uL (ref 0.0–0.5)
EOS%: 1.2 % (ref 0.0–7.0)
HCT: 34.5 % — ABNORMAL LOW (ref 34.8–46.6)
HGB: 11.1 g/dL — ABNORMAL LOW (ref 11.6–15.9)
LYMPH#: 1.3 10*3/uL (ref 0.9–3.3)
LYMPH%: 34.7 % (ref 14.0–49.7)
MCH: 29.1 pg (ref 25.1–34.0)
MCHC: 32.3 g/dL (ref 31.5–36.0)
MCV: 90.1 fL (ref 79.5–101.0)
MONO#: 0.5 10*3/uL (ref 0.1–0.9)
MONO%: 12.4 % (ref 0.0–14.0)
NEUT#: 2 10*3/uL (ref 1.5–6.5)
NEUT%: 50.8 % (ref 38.4–76.8)
Platelets: 212 10*3/uL (ref 145–400)
RBC: 3.83 10*6/uL (ref 3.70–5.45)
RDW: 15.4 % — AB (ref 11.2–14.5)
WBC: 3.9 10*3/uL (ref 3.9–10.3)

## 2015-04-02 LAB — COMPREHENSIVE METABOLIC PANEL
ALT: 9 U/L (ref 0–55)
AST: 11 U/L (ref 5–34)
Albumin: 3.6 g/dL (ref 3.5–5.0)
Alkaline Phosphatase: 74 U/L (ref 40–150)
Anion Gap: 10 mEq/L (ref 3–11)
BUN: 13.5 mg/dL (ref 7.0–26.0)
CHLORIDE: 108 meq/L (ref 98–109)
CO2: 26 mEq/L (ref 22–29)
Calcium: 10.6 mg/dL — ABNORMAL HIGH (ref 8.4–10.4)
Creatinine: 1.1 mg/dL (ref 0.6–1.1)
EGFR: 62 mL/min/{1.73_m2} — ABNORMAL LOW (ref >90)
GLUCOSE: 101 mg/dL (ref 70–140)
POTASSIUM: 4 meq/L (ref 3.5–5.1)
SODIUM: 143 meq/L (ref 136–145)
Total Bilirubin: 0.45 mg/dL (ref 0.20–1.20)
Total Protein: 6.6 g/dL (ref 6.4–8.3)

## 2015-04-02 NOTE — Telephone Encounter (Signed)
Gave and printed appt sched and avs for pt for JUNE °

## 2015-04-02 NOTE — Progress Notes (Signed)
[place]  [phone] [phone]  Clinic follow up Note   Patient Care Team: Katherina Mires, MD as PCP - General (Family Medicine) Autumn Messing III, MD as Consulting Physician (General Surgery) Sherren Mocha, MD as Consulting Physician (Cardiology)  04/02/2015  CHIEF COMPLAINTS:  Follow up left breast cancer   Oncology History   Breast cancer of upper-inner quadrant of left female breast [facility])   Staging form: Breast, AJCC 7th Edition     Clinical: Stage IIIA (T3, N1, M0) - Unsigned     Pathologic stage from 03/15/2015: Stage IIA (yT1c, N1a, cM0) - Signed by Truitt Merle, MD on 04/02/2015       Breast cancer of upper-inner quadrant of left female breast ([place])   08/06/2014 Initial Diagnosis Breast cancer of upper-inner quadrant of left female breast   08/06/2014 Initial Biopsy Left breast 9:30 o'clock position showed invasive ductal carcinoma, and 1 left axillary lymph node biopsy showed positive for metastatic carcinoma.   08/06/2014 Receptors her2 ER 100% positive, PR 5% positive, HER2-, Ki-67 50%   08/06/2014 Mammogram Diagnostic mammogram and ultrasound showed left breast mass with associated calcification measuring 5.5 cm, indeterminate a  left axillary lymph node was cortical thickening, circumscribed low density oval 7m mass at 12:00 within the left breast.   09/03/2014 - 01/26/2015 Adjuvant Chemotherapy ddAC every 2 weeks X4, followed by weekly Taxol X12    03/15/2015 Surgery left lumpectomy and axillary node dissection    03/15/2015 Pathology Results left lumpectomy showed G2 invasive ductal carcinoma, 2cm, G2, and DCIS, G2. the lateral margin was positive for invasive tumor, 1 out of 15 nodes was positive     HISTORY OF PRESENTING ILLNESS:  Mary Grill60y.o. female is here because of recently newly diagnosed left breast cancer. She presents to the clinic with her daughter.  This was discovered by screening mammogram. Her prior mammogram was 2 years  ago. She noticed mild left nipple retraction, no palpable mass, no other changes. She feels well overall, denies any significant pain except mild right knee pain from arthritis. She denies any change of her appetite, or recent weight loss. 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   Her family history is notable for breast cancer in her brother, pancreatic cancer in another brother, and colon cancer in her mother. She is postmenopausal, last menstrual period 8 years ago. She has a daughter and a son. She lives with her brother, and her daughter is likely going to stay with her.  CURRENT TREATMENT:  Pending surgery   INTERIM HISTORY KPawreturns for follow up. She  Underwent left lumpectomy and axillary lymph node dissection on March 15 2015.  She tolerated surgery well, and her pain at the surgical site has much improved. She is not taking much pain medication.  The range of movement of her left shoulder is still limited, she will start physical therapy tomorrow. She is waiting for the schedule for her second surgery for the positive margin. She otherwise doing well.   MEDICAL HISTORY:  Past Medical History  Diagnosis Date  . Hypertension   . 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 Family history of breast cancer   . Family history of pancreatic cancer   . Family history of breast cancer in female   . Angina effort ([place]     none recent  . NSTEMI (non-ST elevated myocardial infarction) ([facility] 03/16/2014    non-obs  CAD, spasm seen  . Pneumonia 2009  . Arthritis     "right knee" (03/16/2015)  . Breast cancer, left ([place]) dx'd 2016    SURGICAL HISTORY: Past Surgical History  Procedure Laterality Date  . Ectopic pregnancy surgery  1991    Tubal pregnancy  . Cardiac catheterization  2010; 03/16/2014  . Tubal ligation  1981  . Left heart catheterization with coronary angiogram N/A 03/16/2014    Procedure: LEFT HEART CATHETERIZATION WITH  CORONARY ANGIOGRAM;  Surgeon: Sherren Mocha, MD; Non-obs CAD, EF 55%  . Bunionectomy Bilateral   . Portacath placement N/A 08/28/2014    Procedure: INSERTION PORT-A-CATH;  Surgeon: Autumn Messing III, MD;  Location: [place];  Service: General;  Laterality: N/A;  . Breast lumpectomy with needle localization and axillary lymph node dissection Left 03/15/2015    Procedure: LEFT BREAST LUMPECTOMY WITH BRACKETED NEEDLE LOCALIZATION AND AXILLARY LYMPH NODE DISSECTION;  Surgeon: Autumn Messing III, MD;  Location: [place];  Service: General;  Laterality: Left;  . Port-a-cath removal Right 03/15/2015    Procedure: REMOVAL PORT-A-CATH;  Surgeon: Autumn Messing III, MD;  Location: [place];  Service: General;  Laterality: Right;  . Breast biopsy Left 08/06/14   GYN HISTORY  Menarchal: 15 LMP: 51 Contraceptive: 4 HRT: no G2P2: no breast feeding     SOCIAL HISTORY: Social History   Social History  . Marital Status: Legally Separated    Spouse Name: N/A  . Number of Children: Daughter 29, son 51   . Years of Education: N/A   Occupational History  . Works for post office    Social History Main Topics  . Smoking status: Never Smoker   . Smokeless tobacco: Never Used  . Alcohol Use: No  . Drug Use: No  . Sexual Activity: No   Other Topics Concern  . Not on file   Social History Narrative    FAMILY HISTORY: Family History  Problem Relation Age of Onset  . Pancreatic cancer Brother 32  . Cancer Brother 40    breast cancer   . Cancer Mother 66    colon cancer   . Hypertension Mother   . Diabetes type II Mother   . CVA Father   . Healthy Sister   . Healthy Sister     ALLERGIES:  is allergic to carvedilol; codeine; percocet; adhesive; and compazine.  MEDICATIONS:  Current Outpatient Prescriptions  Medication Sig Dispense Refill  . amLODipine (NORVASC) 10 MG tablet Take 1 tablet (10 mg total) by mouth daily. 30 tablet 3  . atorvastatin (LIPITOR) 40 MG tablet Take 1 tablet (40 mg total) by mouth daily  at 6 PM. 30 tablet 11  . hydrochlorothiazide (HYDRODIURIL) 25 MG tablet Take 25 mg by mouth daily.    . irbesartan (AVAPRO) 300 MG tablet take 1 tablet by mouth once daily 30 tablet 5  . isosorbide mononitrate (IMDUR) 30 MG 24 hr tablet Take 1 tablet (30 mg total) by mouth daily. 30 tablet 1  . nitroGLYCERIN (NITROSTAT) 0.4 MG SL tablet Place 1 tablet (0.4 mg total) under the tongue every 5 (five) minutes as needed for chest pain. 25 tablet 12  . potassium chloride (K-DUR) 10 MEQ tablet Take 1 tablet (10 mEq total) by mouth daily. 30 tablet 11  . gabapentin (NEURONTIN) 100 MG capsule Take 1 capsule (100 mg total) by mouth 3 (three) times daily. (Patient not taking: Reported on 02/17/2015) 90 capsule 1  . ondansetron (ZOFRAN) 8 MG tablet Take 1 tablet (8 mg  total) by mouth 2 (two) times daily as needed for nausea or vomiting. Start on 3rd day after Chemotherapy (Patient not taking: Reported on 02/17/2015) 30 tablet 1  . traMADol (ULTRAM) 50 MG tablet Take 0.5-1 tablets (25-50 mg total) by mouth every 4 (four) hours as needed for moderate pain or severe pain. (Patient not taking: Reported on 04/02/2015) 30 tablet 1   No current facility-administered medications for this visit.    REVIEW OF SYSTEMS:   Constitutional: Denies fevers, chills or abnormal night sweats Eyes: Denies blurriness of vision, double vision or watery eyes Ears, nose, mouth, throat, and face: Denies mucositis or sore throat Respiratory: Denies cough, dyspnea or wheezes Cardiovascular: Denies palpitation, chest discomfort or lower extremity swelling Gastrointestinal:  Denies nausea, heartburn or change in bowel habits Skin: Denies abnormal skin rashes Lymphatics: Denies new lymphadenopathy or easy bruising Neurological:Denies numbness, tingling or new weaknesses Behavioral/Psych: Mood is stable, no new changes  All other systems were reviewed with the patient and are negative.  PHYSICAL EXAMINATION: ECOG PERFORMANCE STATUS:  2-3  Filed Vitals:   04/02/15 1003  BP: 127/83  Pulse: 94  Temp: 98.5 F (36.9 C)  Resp: 18   Filed Weights   04/02/15 1003  Weight: 175 lb 12.8 oz (79.742 kg)    GENERAL:alert, no distress and comfortable SKIN: skin color, texture, turgor are normal, no rashes or significant lesions, (+) skin pigmentation nails, no significant nail thickening  EYES: normal, conjunctiva are pink and non-injected, sclera clear OROPHARYNX:no exudate, no erythema and lips, buccal mucosa, and tongue normal  NECK: supple, thyroid normal size, non-tender, without nodularity LYMPH:  no palpable lymphadenopathy in the cervical, axillary or inguinal LUNGS: clear to auscultation and percussion with normal breathing effort HEART: regular rate & rhythm and no murmurs and no lower extremity edema ABDOMEN:abdomen soft, non-tender and normal bowel sounds Musculoskeletal:no cyanosis of digits and no clubbing  PSYCH: alert & oriented x 3 with fluent speech NEURO: no focal motor/sensory deficits Breasts: Breast inspection showed them to be symmetrical with no nipple discharge.  The surgical incision in the left breast and axilla are healing well, no skin erythema or discharge.  Exam of both breasts and axilla revealed no evidence of mass or adenopathy.   LABORATORY DATA:  I have reviewed the data as listed CBC Latest Ref Rng 04/02/2015 03/10/2015 02/12/2015  WBC 3.9 - 10.3 10e3/uL 3.9 4.7 4.3  Hemoglobin 11.6 - 15.9 g/dL 11.1(L) 11.3(L) 10.4(L)  Hematocrit 34.8 - 46.6 % 34.5(L) 34.7(L) 32.4(L)  Platelets 145 - 400 10e3/uL 212 225 226    CMP Latest Ref Rng 04/02/2015 03/10/2015 02/12/2015  Glucose 70 - 140 mg/dl 101 102(H) 116  BUN 7.0 - 26.0 mg/dL 13.5 6 13.3  Creatinine 0.6 - 1.1 mg/dL 1.1 1.02(H) 1.0  Sodium 136 - 145 mEq/L 143 143 141  Potassium 3.5 - 5.1 mEq/L 4.0 3.3(L) 3.9  Chloride 101 - 111 mmol/L - 106 -  CO2 22 - 29 mEq/L _0 Calcium 8.4 - 10.4 mg/dL 10.6(H) 10.9(H) 10.6(H)  Total Protein 6.4 -  8.3 g/dL 6.6 - 6.3(L)  Total Bilirubin 0.20 - 1.20 mg/dL 0.45 - 0.44  Alkaline Phos 40 - 150 U/L 74 - 71  AST 5 - 34 U/L 11 - 15  ALT 0 - 55 U/L 9 - 16    PATHOLOGY REPORT: Diagnosis 03/15/2015 1. 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 - INTERMEDIATE GRADE DUCTAL CARCINOMA IN SITU. -  DUCTAL CARCINOMA IN SITU IS FOCALLY 0.1 TO 0.2 CM TO SUPERIOR MARGIN. - DUCTAL CARCINOMA IN SITU IS FOCALLY 0.2 CM TO LATERAL MARGIN. - INVASIVE DUCTAL CARCINOMA INVOLVES LATERAL MARGIN. - PLEASE CORRELATE WITH LOCATION OF SPECIMENS NUMBER 2 (ADDITIONAL SUPERIOR MARGIN) AND NUMBER 3 (ADDITIONAL LATERAL MARGIN) FOR FINAL MARGIN STATUS. - INVASIVE DUCTAL CARCINOMA FOCALLY INVOLVES INFERIOR MARGIN. - OTHER MARGINS ARE NEGATIVE. - SEE ONCOLOGY TEMPLATE. 2. Breast, excision, Left additional superior margin - FOCAL INTERMEDIATE DUCTAL CARCINOMA IN SITU. - MARGIN IS NEGATIVE (TUMOR IS GREATER THAN 0.4 CM TO MARGIN). 3. Breast, excision, Left additional lateral margin - BENIGN BREAST PARENCHYMA WITH FIBROCYSTIC CHANGES. - NO TUMOR SEEN. 4. 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 Microscopic Comment 1. BREAST, INVASIVE TUMOR, WITH LYMPH NODES PRESENT Specimen, including laterality and lymph node sampling (sentinel, non-sentinel): Left partial breast with left additional superior margin and left additional lateral margin; left non-sentinel lymph nodes. Procedure: Left breast lumpectomy with additional left superior margin and additional left lateral margin excisions; left axillary lymph node dissection. Histologic type: Invasive ductal carcinoma. Grade: Tubule formation: 3. Nuclear pleomorphism: 2. Mitotic:1. Tumor size (gross measurement): 2 cm. Margins: Invasive, distance to closest margin: Invasive ductal carcinoma involves lateral margin on initial  lumpectomy specimen but additional lateral margin (specimen number 3) is negative for invasive ductal carcinoma; invasive ductal carcinoma focally involves inferior margin. In-situ, distance to closest margin: Ductal carcinoma in situ is focally 0.1 to 0.2 cm to superior margin on initial lumpectomy specimen but is at least 0.4 cm away from additional superior margin (see specimen #4).. If margin positive, focally or broadly: Initial breast lumpectomy demonstrates fairly broad involvement of the lateral margin; however, the additional lateral margin excision is negative for invasive ductal carcinoma; invasive ductal carcinoma focally involves the inferior margin. Lymphovascular invasion: Definitive lymphovascular invasion is not identified; however, a lymph node is positive for metastatic ductal carcinoma, see below. Ductal carcinoma in situ: Yes. Grade: Intermediate grade. Extensive intraductal component: No. Lobular neoplasia: Not identified. Tumor focality: Unifocal. Treatment effect: Some degree of treatment effect is present in the breast tissue. If present, treatment effect in breast tissue, lymph nodes or both: Treatment effect is only seen in breast tissue. Extent of tumor: Tumor present in breast tissue. Skin: Not received. Nipple: Not received. Skeletal muscle: Not received. Lymph nodes: Examined: 0 Sentinel. 15 Non-sentinel. 15 Total. Lymph nodes with metastasis: 1. Isolated tumor cells (< 0.2 mm): 0.  Microscopic Comment(continued) Micrometastasis: (> 0.2 mm and < 2.0 mm): 0. Macrometastasis: (> 2.0 mm): 1. Extracapsular extension: Not identified. Breast prognostic profile: Performed on previous case, [phone] Estrogen receptor: 100%, positive. Progesterone receptor: 5%, positive. Her-2 neu: 1.47 ratio, negative. Ki-67: 50%. Non-neoplastic breast: Fibrocystic changes. TNM: ypT1c, ypN1a. Comments: As Her-2 neu was previously negative, this will be repeated on  representative current tumor and reported in an addendum to follow. Dr. Lyndon Code has seen selected slides (1A, 1G and 2I) in consultation with agreement that invasive ductal carcinoma involves the lateral margin and focally involves the inferior margin and that ductal carcinoma in situ is focally 0.1 to 0.2 cm to superior margin. He is also in agreement that the findings in slide 2I represent focal ductal carcinoma in situ. (RH:ecj 03/17/2015)  Results: HER2 - NEGATIVE RATIO OF HER2/CEP17 SIGNALS 1.62 AVERAGE HER2 COPY NUMBER PER CELL 2.35  RADIOGRAPHIC STUDIES: I have personally reviewed the radiological images as listed and agreed with the findings  in the report.  Echo 08/24/2014 Study Conclusions  - Left ventricle: The cavity size was normal. Wall thickness was normal. Systolic function was normal. The estimated ejection fraction was in the range of 55% to 60%. - Left atrium: The atrium was mildly dilated.  MR breast b/l w wo contrast 02/01/2015  IMPRESSION: Interval decrease in size of retroareolar mass within the left    breast with decreased conspicuity of previously described non mass enhancement extending from the posterior margin of the mass.  Interval decrease in cortical thickening of previously described left axillary lymph nodes.  RECOMMENDATION: Treatment plan for left breast malignancy.    ASSESSMENT & PLAN:  60 year old African-American female, postmenopausal, healthy and fit, who was found to have a left breast mass and a biopsy confirmed node metastasis by screening mammogram.  1. Left breast invasive ductal carcinoma, cT3N1M0, stage IIIA, ER+/PR+/HER2-, ypT1cN1aM0 after neoadjuvant chemotherapy -I reviewed her surgical pathology findings with her and her daughter in great details. She had a partial response to neoadjuvant chemotherapy, tumor size has significantly decreased, however she still has 1 positive axilla node. - Due to the positive margin, she  will have a second surgery in mid April. - we again discussed the risk of cancer recurrence after her surgery,  Due to the locally advanced stage, persistent node-positive disease after neoadjuvant chemotherapy, she certainly has high risk for recurrence. -Given her strong positive ER/PR on tumor cells, I recommend AI for 10 years, will start after she completes radiation.  The benefits and the potential side effects were discussed with her in great details, she is interested and agreed to start after she completes radiation. - we also discussed the clinical. Trial options. P[facility]AS clinical trial counseling: Patients who have completed definitive therapy for breast cancer are randomized to antiestrogen therapy (5+ years) versus antiestrogen therapy plus Palbociclib (2 years). Palbociclib: If she was randomized to Palbociclib, I discussed the risks and benefits of Ibrance including myelosuppression especially neutropenia and with that risk of infection, there is risk of pulmonary embolism and mild peripheral neuropathy as well. Fatigue, nausea, diarrhea, decreased appetite as well as alopecia and thrombocytopenia are also potential side effects of Palbociclib.  She is interested, I given her a consent form for her to review.  -Giving her positive lymph nodes, she would likely need adjuvant radiation.   2. Cancer genetics She has very strong family history of breast, colon and pancreatic cancer, all in first-degree, I recommend her to see genetic counselor to ruled out inheritable breast cancer syndrome.  -her genetic testing result was normal  3. History of non-STEMI in 03/2014 -She had cardiac catheterization, which showed mild diffuse coronary artery disease, but no need intervention.  -She will follow-up with her cardiologist Dr. Burt Knack -We discussed some of the chemotherapy drug may cause heart failure, we'll repeat her echo after chemo, she may need follow-up with Dr. Burt Knack during her chemotherapy.     4. 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 Continue monitoring  6. 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  Improved lately. - she will continue Neurontin    Plan -She will proceed with second  Breast surgery for positive margin in mid April - she'll have adjuvant breast radiation afterwards - I'll see her back in 2-3 months,  At the end of her radiation, to finalize her adjuvant endocrine therapy.  All questions were answered. The patient knows to call the clinic with any problems, questions or concerns. I spent 20 minutes  counseling the patient face to face. The total time spent in the appointment was 25 minutes and more than 50% was on counseling.     Truitt Merle, MD 04/02/2015

## 2015-04-02 NOTE — Telephone Encounter (Signed)
Gave and printed updated sched pt ok and aware

## 2015-04-05 ENCOUNTER — Ambulatory Visit: Payer: Federal, State, Local not specified - PPO | Attending: Radiation Oncology | Admitting: Physical Therapy

## 2015-04-05 ENCOUNTER — Encounter: Payer: Self-pay | Admitting: Physical Therapy

## 2015-04-05 ENCOUNTER — Other Ambulatory Visit: Payer: Self-pay | Admitting: *Deleted

## 2015-04-05 DIAGNOSIS — M79672 Pain in left foot: Secondary | ICD-10-CM | POA: Insufficient documentation

## 2015-04-05 DIAGNOSIS — M25512 Pain in left shoulder: Secondary | ICD-10-CM | POA: Diagnosis not present

## 2015-04-05 DIAGNOSIS — M25612 Stiffness of left shoulder, not elsewhere classified: Secondary | ICD-10-CM | POA: Diagnosis not present

## 2015-04-05 DIAGNOSIS — M79671 Pain in right foot: Secondary | ICD-10-CM | POA: Diagnosis not present

## 2015-04-05 DIAGNOSIS — M6281 Muscle weakness (generalized): Secondary | ICD-10-CM | POA: Diagnosis not present

## 2015-04-05 MED ORDER — AMLODIPINE BESYLATE 10 MG PO TABS: 10.0000 mg | ORAL_TABLET | Freq: Every day | ORAL | Status: DC

## 2015-04-05 NOTE — Therapy (Signed)
[place], [place], [address] Phone: [phone]   Fax:  [phone]  Physical Therapy Evaluation  Patient Details  Name: Mary Randall MRN: [id number] Date of Birth: 07-03-1955 Referring Provider: Dr. Isidore Moos  Encounter Date: 04/05/2015      PT End of Session - 04/05/15 1102    Visit Number 1   Number of Visits 17   Date for PT Re-Evaluation 06/21/15   PT Start Time 1021   PT Stop Time 1100   PT Time Calculation (min) 39 min   Activity Tolerance Patient tolerated treatment well   Behavior During Therapy [facility] for tasks assessed/performed      Past Medical History  Diagnosis Date  . Hypertension   . 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 Family history of breast cancer   . Family history of pancreatic cancer   . Family history of breast cancer in female   . Angina effort ([place])     none recent  . NSTEMI (non-ST elevated myocardial infarction) ([place]) 03/16/2014    non-obs CAD, spasm seen  . Pneumonia 2009  . Arthritis     "right knee" (03/16/2015)  . Breast cancer, left ([place]) dx'd 2016    Past Surgical History  Procedure Laterality Date  . Ectopic pregnancy surgery  1991    Tubal pregnancy  . Cardiac catheterization  2010; 03/16/2014  . Tubal ligation  1981  . Left heart catheterization with coronary angiogram N/A 03/16/2014    Procedure: LEFT HEART CATHETERIZATION WITH CORONARY ANGIOGRAM;  Surgeon: Sherren Mocha, MD; Non-obs CAD, EF 55%  . Bunionectomy Bilateral   . Portacath placement N/A 08/28/2014    Procedure: INSERTION PORT-A-CATH;  Surgeon: Autumn Messing III, MD;  Location: [facility];  Service: General;  Laterality: N/A;  . Breast lumpectomy with needle localization and axillary lymph node dissection Left 03/15/2015    Procedure: LEFT BREAST LUMPECTOMY WITH BRACKETED NEEDLE LOCALIZATION AND AXILLARY LYMPH NODE DISSECTION;  Surgeon: Autumn Messing III, MD;  Location: [facility];   Service: General;  Laterality: Left;  . Port-a-cath removal Right 03/15/2015    Procedure: REMOVAL PORT-A-CATH;  Surgeon: Autumn Messing III, MD;  Location: [facility];  Service: General;  Laterality: Right;  . Breast biopsy Left 08/06/14    There were no vitals filed for this visit.  Visit Diagnosis:  Left shoulder pain - Plan: PT plan of care cert/re-cert  Limitation of joint motion of left shoulder - Plan: PT plan of care cert/re-cert  Muscle weakness (generalized) - Plan: PT plan of care cert/re-cert      Subjective Assessment - 04/05/15 1025    Subjective Pt states she had a left lumpetomy and now has limited arm movement due to surgery and needs physical therapy.    Pertinent History Left lumpectomy on 03/15/15 showed G2 invasive ductal carcinoma, 2cm, G2, and DCIS, G2. the lateral margin was positive for invasive tumor, 1 out of 15 nodes was positive. Pt requires another breast surgery in the next couple of weeks to clean margins. She also will begin radiation in approx 8-10 weeks.    Patient Stated Goals for LUE to move like RUE   Currently in Pain? No/denies   Pain Score 0-No pain            [facility] PT Assessment - 04/05/15 0001    Assessment   Medical Diagnosis left breast cancer   Referring Provider Dr. 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 1   Has the patient had a decrease in activity level because of a fear of falling?  No   Is the patient reluctant to leave their home because of a fear of falling?  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  walks 30%, rides other; Barista of any weight   Leisure pt is not currently exercising   Cognition   Overall Cognitive Status Within Functional Limits for tasks assessed   AROM   Right Shoulder Extension 70 Degrees   Right Shoulder Flexion 155 Degrees   Right Shoulder ABduction 168 Degrees   Right Shoulder Internal Rotation 85 Degrees   Right Shoulder External Rotation 90 Degrees   Left Shoulder Extension 55 Degrees   Left Shoulder Flexion 89 Degrees   Left Shoulder ABduction 84 Degrees   Left Shoulder Internal Rotation 66 Degrees   Left Shoulder External Rotation 81 Degrees   Strength   Strength Assessment Site Shoulder  R shoulder grossly 5/5, left not tested secondary to pain           LYMPHEDEMA/ONCOLOGY QUESTIONNAIRE - 04/05/15 1032    Type   Cancer Type left breast cancer   Surgeries   Lumpectomy Date 03/15/15   Sentinel Lymph Node Biopsy Date --   Axillary Lymph Node Dissection Date 03/15/15   Other Surgery Date --  pt will require another breast surgery in the next two weeks   Number Lymph Nodes Removed 15   Treatment   Active Chemotherapy Treatment No   Past Chemotherapy Treatment Yes   Date 01/26/15   Active Radiation Treatment No  pt to begin radiation after another breast surgery   Past Radiation Treatment No   Current Hormone Treatment No  will begin after radiation   Past Hormone Therapy No   What other symptoms do you have   Are you Having Heaviness or Tightness No   Are you having Pain Yes  in back of arm and in axilla on L   Are you having pitting edema No   Is it Hard or Difficult finding clothes that fit No   Do you have infections No   Is there Decreased scar mobility --   scar is still healing - unable to assess   [place] Sign No   Right Upper Extremity Lymphedema   15 cm Proximal to Olecranon Process 32 cm   Olecranon Process 25.5 cm   10 cm Proximal to Ulnar Styloid Process 20 cm   Just Proximal to Ulnar Styloid Process 15.5 cm   Across Hand at [employer] 18.5 cm   At [place] of 2nd Digit 6 cm   Left Upper Extremity Lymphedema   15 cm Proximal to Olecranon  Process 32.3 cm   Olecranon Process 26.6 cm   10 cm Proximal to Ulnar Styloid Process 19.7 cm   Just Proximal to Ulnar Styloid Process 15.4 cm   Across Hand at [employer] 19 cm   At [place] of 2nd Digit 6 cm           Quick Dash - 04/05/15 0001    Open a tight or new jar Unable   Do heavy household chores (wash walls, wash floors) Unable   Carry a shopping bag or briefcase Moderate difficulty   Wash your back Severe difficulty   Use a knife to cut food Severe difficulty   Recreational activities in which you take some force or impact through your arm, shoulder, or hand (golf, hammering, tennis) Unable   During the past week, to what extent has your arm, shoulder or hand problem interfered with your normal social activities with family, friends, neighbors, or groups? Quite a bit   During the past week, to what extent has your arm, shoulder or hand problem limited your work or other regular daily activities Extremely   Arm, shoulder, or hand pain. Moderate   Tingling (pins and needles) in your arm, shoulder, or hand Moderate   Difficulty Sleeping Mild difficulty   DASH Score 72.73 %             [facility] Adult PT Treatment/Exercise - 04/05/15 0001    Shoulder Exercises: Supine   Flexion AAROM;10 reps  with dowel   ABduction AAROM;10 reps  with dowel                PT Education - 04/05/15 1102    Education provided Yes   Education Details supine dowel shoulder ROM exercises   Person(s) Educated Patient   Methods Explanation;Demonstration;Handout   Comprehension  Verbalized understanding;Returned demonstration           Short Term Clinic Goals - 04/05/15 1250    CC Short Term Goal  #1   Title Pt to demonstrate 120 degrees of left shoulder flexion to allow pt to reach items overhead.   Baseline 89 degrees   Time 4   Period Weeks   Status New   CC Short Term Goal  #2   Title Pt to demonstrate 120 degrees of left shoulder abduction to allow pt to reach items out to sides   Baseline 84   Time 4   Period Weeks   Status New   CC Short Term Goal  #3   Title Pt to be able to verbalize lymphedema risk reduction strategies   Baseline unable   Time 4   Period Weeks   Status New             Long Term Clinic Goals - 04/05/15 1252    CC Long Term Goal  #1   Title Pt to demonstrate 155 degrees of left shoulder flexion to allow pt to reach items on shelves   Baseline 89   Time 8   Period Weeks   Status New   CC Long Term Goal  #2   Title Pt to demonstrate 165 degrees of left shoulder abduction to allow pt to return to prior level of function   Baseline 84   Time 8   Period Weeks   Status New   CC Long Term Goal  #3   Title Pt to demonstrate 85 degrees of left shoulder IR to allow pt to return to prior level of function  Baseline 66   Time 8   Period Weeks   Status New   CC Long Term Goal  #4   Title Pt to be independent with a home exercise program for strengthening and ROM for long term management    Baseline na   Time 8   Period Weeks   Status New   CC Long Term Goal  #5   Title Pt to demonstrate gross LUE of 5/5 to allow pt to return to PLOF   Baseline unable to assess secondary to pain   Time 8   Period Weeks   Status New            Plan - 04/05/15 1108    Clinical Impression Statement Pt presents with decreased left shoulder ROM and strength following left lumpectomy with removal of 15 lymph nodes. Another surgery is to be performed in the next couple of weeks to clean margins. Pt recently discharged from skilled  PT services for bilateral LE weakness. She would benefit from skilled PT services to increase left upper strength and ROM and educate pt in risk reduction for lymphedema.    Pt will benefit from skilled therapeutic intervention in order to improve on the following deficits Decreased range of motion;Pain;Impaired UE functional use;Decreased strength   Rehab Potential Good   Clinical Impairments Affecting Rehab Potential pt to have second breast surgery to clean margins, pt to begin radiation in 8-10 weeks   PT Frequency 2x / week   PT Duration 8 weeks   PT Treatment/Interventions Manual techniques;Therapeutic exercise;Taping;Electrical Stimulation;Passive range of motion;Scar mobilization   PT Next Visit Plan PROM/AAROM/AROM to left shoulder, add to HEP as needed   PT Home Exercise Plan supine AAROM with dowel   Consulted and Agree with Plan of Care Patient         Problem List Patient Active Problem List   Diagnosis Date Noted  . Breast cancer, female, left 03/15/2015  . UTI (urinary tract infection) 12/02/2014  . Chemotherapy induced nausea and vomiting 11/03/2014  . Anemia due to antineoplastic chemotherapy 11/03/2014  . Genetic testing 10/13/2014  . Family history of breast cancer   . Family history of pancreatic cancer   . Family history of breast cancer in female   . Breast cancer of upper-inner quadrant of left female breast ([place]) 08/16/2014  . BP (high blood pressure) 04/17/2014  . NSTEMI (non-ST elevated myocardial infarction) ([place]) 03/16/2014  . 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 Chest pain 09/04/2012  . Essential hypertension, benign 09/04/2012  . Angina pectoris with documented spasm ([place]) 03/05/2012  . Coronary artery spasm ([place]) 03/05/2012  . Arthritis of knee, degenerative 12/04/2011    Alexia Freestone 04/05/2015, 1:00 PM  [place], [place], [address] Phone: [phone]   Fax:   [phone]  Name: Mary Randall MRN: [id number] Date of Birth: Oct 26, 1955   Allyson Sabal, PT 04/05/2015 1:00 PM

## 2015-04-05 NOTE — Patient Instructions (Signed)
Shoulder: Flexion (Supine)    With hands shoulder width apart, slowly lower dowel to floor behind head. Do not let elbows bend. Keep back flat. Hold __1__ seconds. Repeat _10___ times. Do __2__ sessions per day.  CAUTION: Stretch slowly and gently.  Copyright  VHI. All rights reserved.   Shoulder: Abduction (Supine)    With left arm flat on floor, hold dowel in palm. Slowly move arm up to side of head by pushing with opposite arm. Do not let elbow bend. Hold _1___ seconds. Repeat __10__ times. Do _2___ sessions per day. CAUTION: Stretch slowly and gently.  Copyright  VHI. All rights reserved.

## 2015-04-07 ENCOUNTER — Ambulatory Visit: Payer: Federal, State, Local not specified - PPO

## 2015-04-07 DIAGNOSIS — M25512 Pain in left shoulder: Secondary | ICD-10-CM | POA: Diagnosis not present

## 2015-04-07 DIAGNOSIS — M25612 Stiffness of left shoulder, not elsewhere classified: Secondary | ICD-10-CM | POA: Diagnosis not present

## 2015-04-07 DIAGNOSIS — M79672 Pain in left foot: Secondary | ICD-10-CM | POA: Diagnosis not present

## 2015-04-07 DIAGNOSIS — M79671 Pain in right foot: Secondary | ICD-10-CM | POA: Diagnosis not present

## 2015-04-07 DIAGNOSIS — M6281 Muscle weakness (generalized): Secondary | ICD-10-CM | POA: Diagnosis not present

## 2015-04-07 NOTE — Progress Notes (Signed)
LOV dr cooper 3/17 ekg 3/17 Cath 3/16 Chest 10/16 Cbc with diff, cmp 04/02/15 eccho 8/16

## 2015-04-07 NOTE — Therapy (Signed)
[place], [place], [address] Phone: [phone]   Fax:  [phone]  Physical Therapy Treatment  Patient Details  Name: Mary Randall MRN: [id number] Date of Birth: 12-08-1955 Referring Provider: Dr. Isidore Moos  Encounter Date: 04/07/2015      PT End of Session - 04/07/15 1436    Visit Number 2   Number of Visits 17   Date for PT Re-Evaluation 06/21/15   PT Start Time [id number]   PT Stop Time 1434   PT Time Calculation (min) 39 min   Activity Tolerance Patient tolerated treatment well   Behavior During Therapy [facility] for tasks assessed/performed      Past Medical History  Diagnosis Date  . Hypertension   . 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 Family history of breast cancer   . Family history of pancreatic cancer   . Family history of breast cancer in female   . Angina effort ([place])     none recent  . NSTEMI (non-ST elevated myocardial infarction) ([place]) 03/16/2014    non-obs CAD, spasm seen  . Pneumonia 2009  . Arthritis     "right knee" (03/16/2015)  . Breast cancer, left ([place]) dx'd 2016    Past Surgical History  Procedure Laterality Date  . Ectopic pregnancy surgery  1991    Tubal pregnancy  . Cardiac catheterization  2010; 03/16/2014  . Tubal ligation  1981  . Left heart catheterization with coronary angiogram N/A 03/16/2014    Procedure: LEFT HEART CATHETERIZATION WITH CORONARY ANGIOGRAM;  Surgeon: Sherren Mocha, MD; Non-obs CAD, EF 55%  . Bunionectomy Bilateral   . Portacath placement N/A 08/28/2014    Procedure: INSERTION PORT-A-CATH;  Surgeon: Autumn Messing III, MD;  Location: [facility];  Service: General;  Laterality: N/A;  . Breast lumpectomy with needle localization and axillary lymph node dissection Left 03/15/2015    Procedure: LEFT BREAST LUMPECTOMY WITH BRACKETED NEEDLE LOCALIZATION AND AXILLARY LYMPH NODE DISSECTION;  Surgeon: Autumn Messing III, MD;  Location: [facility];   Service: General;  Laterality: Left;  . Port-a-cath removal Right 03/15/2015    Procedure: REMOVAL PORT-A-CATH;  Surgeon: Autumn Messing III, MD;  Location: [facility];  Service: General;  Laterality: Right;  . Breast biopsy Left 08/06/14    There were no vitals filed for this visit.  Visit Diagnosis:  Left shoulder pain  Limitation of joint motion of left shoulder      Subjective Assessment - 04/07/15 1358    Subjective I'm doing okay, my arm is just sore.   Pertinent History Left lumpectomy on 03/15/15 showed G2 invasive ductal carcinoma, 2cm, G2, and DCIS, G2. the lateral margin was positive for invasive tumor, 1 out of 15 nodes was positive. Pt requires another breast surgery in the next couple of weeks to clean margins. She also will begin radiation in approx 8-10 weeks.    Patient Stated Goals for LUE to move like RUE   Currently in Pain? No/denies                         [facility] Adult PT Treatment/Exercise - 04/07/15 0001    Shoulder Exercises: Pulleys   Flexion Other (comment)  4 mins   Shoulder Exercises: Therapy Ball   Flexion 5 reps   Shoulder Exercises: ROM/Strengthening   Other ROM/Strengthening Exercises Finger Ladder for Lt UE abduction 5 reps   Manual Therapy   Passive ROM In [place]  with HOB elevated: Lt shoulder in to flexion, abdcution and ER all to pts tolerance.                   Short Term Clinic Goals - 04/05/15 1250    CC Short Term Goal  #1   Title Pt to demonstrate 120 degrees of left shoulder flexion to allow pt to reach items overhead.   Baseline 89 degrees   Time 4   Period Weeks   Status New   CC Short Term Goal  #2   Title Pt to demonstrate 120 degrees of left shoulder abduction to allow pt to reach items out to sides   Baseline 84   Time 4   Period Weeks   Status New   CC Short Term Goal  #3   Title Pt to be able to verbalize lymphedema risk reduction strategies   Baseline unable   Time 4   Period Weeks   Status New              Long Term Clinic Goals - 04/05/15 1252    CC Long Term Goal  #1   Title Pt to demonstrate 155 degrees of left shoulder flexion to allow pt to reach items on shelves   Baseline 89   Time 8   Period Weeks   Status New   CC Long Term Goal  #2   Title Pt to demonstrate 165 degrees of left shoulder abduction to allow pt to return to prior level of function   Baseline 84   Time 8   Period Weeks   Status New   CC Long Term Goal  #3   Title Pt to demonstrate 85 degrees of left shoulder IR to allow pt to return to prior level of function   Baseline 66   Time 8   Period Weeks   Status New   CC Long Term Goal  #4   Title Pt to be independent with a home exercise program for strengthening and ROM for long term management    Baseline na   Time 8   Period Weeks   Status New   CC Long Term Goal  #5   Title Pt to demonstrate gross LUE of 5/5 to allow pt to return to PLOF   Baseline unable to assess secondary to pain   Time 8   Period Weeks   Status New            Plan - 04/07/15 1437    Clinical Impression Statement Pt did well with AAROM exercises and though needed verbal cuing intermittently during PROM, she tolerated this well and reported feeling good after session today. Has done her dowel exs once since last visit but will begin being more consistent with them.    Pt will benefit from skilled therapeutic intervention in order to improve on the following deficits Decreased range of motion;Pain;Impaired UE functional use;Decreased strength   Rehab Potential Good   Clinical Impairments Affecting Rehab Potential pt to have second breast surgery to clean margins, pt to begin radiation in 8-10 weeks   PT Frequency 2x / week   PT Duration 8 weeks   PT Treatment/Interventions Manual techniques;Therapeutic exercise;Taping;Electrical Stimulation;Passive range of motion;Scar mobilization   PT Next Visit Plan PROM/AAROM/AROM to left shoulder, add to HEP as needed. Assess  next visit, measure ROM.    Consulted and Agree with Plan of Care Patient        Problem List Patient Active  Problem List   Diagnosis Date Noted  . Breast cancer, female, left 03/15/2015  . UTI (urinary tract infection) 12/02/2014  . Chemotherapy induced nausea and vomiting 11/03/2014  . Anemia due to antineoplastic chemotherapy 11/03/2014  . Genetic testing 10/13/2014  . Family history of breast cancer   . Family history of pancreatic cancer   . Family history of breast cancer in female   . Breast cancer of upper-inner quadrant of left female breast ([place]) 08/16/2014  . BP (high blood pressure) 04/17/2014  . NSTEMI (non-ST elevated myocardial infarction) ([place]) 03/16/2014  . 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 Chest pain 09/04/2012  . Essential hypertension, benign 09/04/2012  . Angina pectoris with documented spasm ([place]) 03/05/2012  . Coronary artery spasm ([place]) 03/05/2012  . Arthritis of knee, degenerative 12/04/2011    Otelia Limes, PTA 04/07/2015, 2:40 PM  [place], [place], [address] Phone: [phone]   Fax:  [phone]  Name: Mary Randall MRN: [id number] Date of Birth: [birth year]

## 2015-04-08 ENCOUNTER — Encounter (HOSPITAL_COMMUNITY)
Admission: RE | Admit: 2015-04-08 | Discharge: 2015-04-08 | Disposition: A | Discharge status: Home / Self Care | Payer: Federal, State, Local not specified - PPO | Source: Ambulatory Visit | Attending: General Surgery | Admitting: General Surgery

## 2015-04-08 ENCOUNTER — Encounter (HOSPITAL_COMMUNITY): Payer: Self-pay

## 2015-04-08 DIAGNOSIS — C50912 Malignant neoplasm of unspecified site of left female breast: Secondary | ICD-10-CM | POA: Diagnosis not present

## 2015-04-08 DIAGNOSIS — Z01818 Encounter for other preprocedural examination: Secondary | ICD-10-CM | POA: Insufficient documentation

## 2015-04-08 NOTE — Patient Instructions (Addendum)
ALISE HARPS  04/08/2015   Your procedure is scheduled on: April 21, 2015  Report to [facility] Main  Entrance take [facility]  elevators to 3rd floor to  [place] at 8:30 AM.  Call this number if you have problems the morning of surgery [phone]   Remember: ONLY 1 PERSON MAY GO WITH YOU TO SHORT STAY TO GET  READY MORNING OF [place].  Do not eat food or drink liquids :After Midnight.     Take these medicines the morning of surgery with A SIP OF WATER: Amlodipine (Norvasc), Imdur DO NOT TAKE ANY DIABETIC MEDICATIONS DAY OF YOUR SURGERY                               You may not have any metal on your body including hair pins and              piercings  Do not wear jewelry, make-up, lotions, powders or perfumes, deodorant             Do not wear nail polish.  Do not shave  48 hours prior to surgery.            Do not bring valuables to the hospital. [place].  Contacts, dentures or bridgework may not be worn into surgery.  Leave suitcase in the car. After surgery it may be brought to your room.     Patients discharged the day of surgery will not be allowed to drive home.  Name and phone number of your driver: [facility]   Special Instructions: coughing and deep breathing exercises, leg exercises              Please read over the following fact sheets you were given: _____________________________________________________________________             [facility] - Preparing for Surgery Before surgery, you can play an important role.  Because skin is not sterile, your skin needs to be as free of germs as possible.  You can reduce the number of germs on your skin by washing with CHG (chlorahexidine gluconate) soap before surgery.  CHG is an antiseptic cleaner which kills germs and bonds with the skin to continue killing germs even after washing. Please DO NOT use if you have an allergy to CHG  or antibacterial soaps.  If your skin becomes reddened/irritated stop using the CHG and inform your nurse when you arrive at Short Stay. Do not shave (including legs and underarms) for at least 48 hours prior to the first CHG shower.  You may shave your face/neck. Please follow these instructions carefully:  1.  Shower with CHG Soap the night before surgery and the  morning of Surgery.  2.  If you choose to wash your hair, wash your hair first as usual with your  normal  shampoo.  3.  After you shampoo, rinse your hair and body thoroughly to remove the  shampoo.                           4.  Use CHG as you would any other liquid soap.  You can apply chg directly  to the skin and  wash                       Gently with a scrungie or clean washcloth.  5.  Apply the CHG Soap to your body ONLY FROM THE NECK DOWN.   Do not use on face/ open                           Wound or open sores. Avoid contact with eyes, ears mouth and genitals (private parts).                       Wash face,  Genitals (private parts) with your normal soap.             6.  Wash thoroughly, paying special attention to the area where your surgery  will be performed.  7.  Thoroughly rinse your body with warm water from the neck down.  8.  DO NOT shower/wash with your normal soap after using and rinsing off  the CHG Soap.                9.  Pat yourself dry with a clean towel.            10.  Wear clean pajamas.            11.  Place clean sheets on your bed the night of your first shower and do not  sleep with pets. Day of Surgery : Do not apply any lotions/deodorants the morning of surgery.  Please wear clean clothes to the hospital/surgery center.  FAILURE TO FOLLOW THESE INSTRUCTIONS MAY RESULT IN THE CANCELLATION OF YOUR SURGERY PATIENT SIGNATURE_________________________________  NURSE SIGNATURE__________________________________  ________________________________________________________________________

## 2015-04-13 ENCOUNTER — Ambulatory Visit: Payer: Federal, State, Local not specified - PPO

## 2015-04-13 DIAGNOSIS — M79671 Pain in right foot: Secondary | ICD-10-CM

## 2015-04-13 DIAGNOSIS — M79672 Pain in left foot: Secondary | ICD-10-CM

## 2015-04-13 DIAGNOSIS — M25612 Stiffness of left shoulder, not elsewhere classified: Secondary | ICD-10-CM | POA: Diagnosis not present

## 2015-04-13 DIAGNOSIS — M25512 Pain in left shoulder: Secondary | ICD-10-CM

## 2015-04-13 DIAGNOSIS — M6281 Muscle weakness (generalized): Secondary | ICD-10-CM | POA: Diagnosis not present

## 2015-04-13 NOTE — Therapy (Signed)
[place], [place], [address] Phone: [phone]   Fax:  [phone]  Physical Therapy Treatment  Patient Details  Name: Mary Randall MRN: [id number] Date of Birth: 16-Mar-1955 Referring Provider: Dr. Isidore Moos  Encounter Date: 04/13/2015      PT End of Session - 04/13/15 1019    Visit Number 3   Number of Visits 17   Date for PT Re-Evaluation 06/21/15   PT Start Time 0937   PT Stop Time 1017   PT Time Calculation (min) 40 min   Activity Tolerance Patient tolerated treatment well   Behavior During Therapy [facility] for tasks assessed/performed      Past Medical History  Diagnosis Date  . Hypertension   . 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 Family history of breast cancer   . Family history of pancreatic cancer   . Family history of breast cancer in female   . Angina effort ([place])     none recent  . NSTEMI (non-ST elevated myocardial infarction) ([place]) 03/16/2014    non-obs CAD, spasm seen  . Pneumonia 2009  . Arthritis     "right knee" (03/16/2015)  . Breast cancer, left ([place]) dx'd 2016    Past Surgical History  Procedure Laterality Date  . Ectopic pregnancy surgery  1991    Tubal pregnancy  . Cardiac catheterization  2010; 03/16/2014  . Tubal ligation  1981  . Left heart catheterization with coronary angiogram N/A 03/16/2014    Procedure: LEFT HEART CATHETERIZATION WITH CORONARY ANGIOGRAM;  Surgeon: Sherren Mocha, MD; Non-obs CAD, EF 55%  . Bunionectomy Bilateral   . Portacath placement N/A 08/28/2014    Procedure: INSERTION PORT-A-CATH;  Surgeon: Autumn Messing III, MD;  Location: [place];  Service: General;  Laterality: N/A;  . Breast lumpectomy with needle localization and axillary lymph node dissection Left 03/15/2015    Procedure: LEFT BREAST LUMPECTOMY WITH BRACKETED NEEDLE LOCALIZATION AND AXILLARY LYMPH NODE DISSECTION;  Surgeon: Autumn Messing III, MD;  Location: [place];   Service: General;  Laterality: Left;  . Port-a-cath removal Right 03/15/2015    Procedure: REMOVAL PORT-A-CATH;  Surgeon: Autumn Messing III, MD;  Location: [place];  Service: General;  Laterality: Right;  . Breast biopsy Left 08/06/14    There were no vitals filed for this visit.      Subjective Assessment - 04/13/15 0939    Subjective I'm doing good, my Lt arm is doing a little better. Next Tuesday will be my last visit for a couple weeks because I have my second surgery to try to get clear margins. I'd like to be on hold during that time until surgeon clears me to come back if I can.    Pertinent History Left lumpectomy on 03/15/15 showed G2 invasive ductal carcinoma, 2cm, G2, and DCIS, G2. the lateral margin was positive for invasive tumor, 1 out of 15 nodes was positive. Pt requires another breast surgery in the next couple of weeks to clean margins. She also will begin radiation in approx 8-10 weeks.    Patient Stated Goals for LUE to move like RUE   Currently in Pain? No/denies            [facility] PT Assessment - 04/13/15 0001    AROM   Left Shoulder Flexion 121 Degrees   Left Shoulder ABduction 96 Degrees  [facility] Adult PT Treatment/Exercise - 04/13/15 0001    Shoulder Exercises: Pulleys   Flexion 2 minutes   ABduction 2 minutes   Shoulder Exercises: Therapy Ball   Flexion 5 reps   Shoulder Exercises: ROM/Strengthening   Other ROM/Strengthening Exercises Finger Ladder for Lt UE abduction x5 reps (up to #20)   Shoulder Exercises: Stretch   Corner Stretch 3 reps;10 seconds   Other Shoulder Stretches Lt Ue neural tension stretch on wall 3 reps 5 second holds, very tight/tingling for pt   Manual Therapy   Passive ROM In Supine with HOB elevated: Lt shoulder in to flexion, abduction and ER all to pts tolerance.                   Short Term Clinic Goals - 04/05/15 1250    CC Short Term Goal  #1   Title Pt to demonstrate 120 degrees of left  shoulder flexion to allow pt to reach items overhead.   Baseline 89 degrees   Time 4   Period Weeks   Status New   CC Short Term Goal  #2   Title Pt to demonstrate 120 degrees of left shoulder abduction to allow pt to reach items out to sides   Baseline 84   Time 4   Period Weeks   Status New   CC Short Term Goal  #3   Title Pt to be able to verbalize lymphedema risk reduction strategies   Baseline unable   Time 4   Period Weeks   Status New             Long Term Clinic Goals - 04/13/15 1127    CC Long Term Goal  #1   Title Pt to demonstrate 155 degrees of left shoulder flexion to allow pt to reach items on shelves   Baseline 89, 121 degrees 04/13/15   Status On-going   CC Long Term Goal  #2   Title Pt to demonstrate 165 degrees of left shoulder abduction to allow pt to return to prior level of function   Baseline 84, 96 degrees 04/13/15   Status On-going   CC Long Term Goal  #3   Title Pt to demonstrate 85 degrees of left shoulder IR to allow pt to return to prior level of function   Status On-going   CC Long Term Goal  #4   Title Pt to be independent with a home exercise program for strengthening and ROM for long term management    Status On-going   CC Long Term Goal  #5   Title Pt to demonstrate gross LUE of 5/5 to allow pt to return to PLOF   Status On-going            Plan - 04/13/15 1019    Clinical Impression Statement Pt continued to tolerate treatment well today and demonstrated good increase in her AROM meausurements today. Overall she is making good progress. has her second surgery (to get clear margins) next week on Lt breast so will put her on hold after next Tuesday visit.    Rehab Potential Good   Clinical Impairments Affecting Rehab Potential pt to have second breast surgery to clean margins, pt to begin radiation in 8-10 weeks   PT Frequency 2x / week   PT Duration 8 weeks   PT Treatment/Interventions Manual techniques;Therapeutic  exercise;Taping;Electrical Stimulation;Passive range of motion;Scar mobilization   PT Next Visit Plan PROM/AAROM/AROM to left shoulder, add to HEP as needed. If new  order sent for CIPN for feet, add IFC to feet at end of session.    Consulted and Agree with Plan of Care Patient      Patient will benefit from skilled therapeutic intervention in order to improve the following deficits and impairments:  Decreased range of motion, Pain, Impaired UE functional use, Decreased strength  Visit Diagnosis: Left shoulder pain  Stiffness of left shoulder, not elsewhere classified     Problem List Patient Active Problem List   Diagnosis Date Noted  . Breast cancer, female, left 03/15/2015  . UTI (urinary tract infection) 12/02/2014  . Chemotherapy induced nausea and vomiting 11/03/2014  . Anemia due to antineoplastic chemotherapy 11/03/2014  . Genetic testing 10/13/2014  . Family history of breast cancer   . Family history of pancreatic cancer   . Family history of breast cancer in female   . Breast cancer of upper-inner quadrant of left female breast ([place]) 08/16/2014  . BP (high blood pressure) 04/17/2014  . NSTEMI (non-ST elevated myocardial infarction) ([place]) 03/16/2014  . 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 Chest pain 09/04/2012  . Essential hypertension, benign 09/04/2012  . Angina pectoris with documented spasm ([place]) 03/05/2012  . Coronary artery spasm ([place]) 03/05/2012  . Arthritis of knee, degenerative 12/04/2011    Otelia Limes, PTA 04/13/2015, 11:31 AM  [place], [place], [address] Phone: [phone]   Fax:  [phone]  Name: Mary Randall MRN: [id number] Date of Birth: 12/15/1955

## 2015-04-15 ENCOUNTER — Ambulatory Visit: Payer: Federal, State, Local not specified - PPO | Admitting: Physical Therapy

## 2015-04-15 ENCOUNTER — Other Ambulatory Visit: Payer: Self-pay | Admitting: *Deleted

## 2015-04-15 ENCOUNTER — Encounter: Payer: Self-pay | Admitting: Physical Therapy

## 2015-04-15 DIAGNOSIS — M25612 Stiffness of left shoulder, not elsewhere classified: Secondary | ICD-10-CM

## 2015-04-15 DIAGNOSIS — M79672 Pain in left foot: Secondary | ICD-10-CM | POA: Diagnosis not present

## 2015-04-15 DIAGNOSIS — M25512 Pain in left shoulder: Secondary | ICD-10-CM | POA: Diagnosis not present

## 2015-04-15 DIAGNOSIS — M79671 Pain in right foot: Secondary | ICD-10-CM

## 2015-04-15 DIAGNOSIS — M6281 Muscle weakness (generalized): Secondary | ICD-10-CM | POA: Diagnosis not present

## 2015-04-15 MED ORDER — AMLODIPINE BESYLATE 10 MG PO TABS: 10.0000 mg | ORAL_TABLET | Freq: Every evening | ORAL | Status: DC

## 2015-04-15 MED ORDER — POTASSIUM CHLORIDE ER 10 MEQ PO TBCR: 10.0000 meq | EXTENDED_RELEASE_TABLET | Freq: Every evening | ORAL | Status: DC

## 2015-04-15 NOTE — Addendum Note (Signed)
Addended by: Alexia Freestone on: 04/15/2015 09:47 AM   Modules accepted: Orders

## 2015-04-15 NOTE — Therapy (Signed)
[place], [place], [address] Phone: [phone]   Fax:  [phone]  Physical Therapy Treatment  Patient Details  Name: Mary Randall MRN: [id number] Date of Birth: [birth year] Referring Provider: Dr. Isidore Moos  Encounter Date: 04/15/2015      PT End of Session - 04/15/15 1204    Visit Number 4   Number of Visits 17   Date for PT Re-Evaluation 06/21/15   Authorization Type April cert performed   Authorization Time Period pt arrived late   PT Start Time 1120   PT Stop Time 1155   PT Time Calculation (min) 35 min   Activity Tolerance Patient tolerated treatment well   Behavior During Therapy [facility] for tasks assessed/performed      Past Medical History  Diagnosis Date  . Hypertension   . 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 Family history of breast cancer   . Family history of pancreatic cancer   . Family history of breast cancer in female   . Angina effort ([place])     none recent  . NSTEMI (non-ST elevated myocardial infarction) ([place]) 03/16/2014    non-obs CAD, spasm seen  . Pneumonia 2009  . Arthritis     "right knee" (03/16/2015)  . Breast cancer, left ([place]) dx'd 2016    Past Surgical History  Procedure Laterality Date  . Ectopic pregnancy surgery  1991    Tubal pregnancy  . Cardiac catheterization  2010; 03/16/2014  . Tubal ligation  1981  . Left heart catheterization with coronary angiogram N/A 03/16/2014    Procedure: LEFT HEART CATHETERIZATION WITH CORONARY ANGIOGRAM;  Surgeon: Sherren Mocha, MD; Non-obs CAD, EF 55%  . Bunionectomy Bilateral   . Portacath placement N/A 08/28/2014    Procedure: INSERTION PORT-A-CATH;  Surgeon: Autumn Messing III, MD;  Location: [place];  Service: General;  Laterality: N/A;  . Breast lumpectomy with needle localization and axillary lymph node dissection Left 03/15/2015    Procedure: LEFT BREAST LUMPECTOMY WITH BRACKETED NEEDLE  LOCALIZATION AND AXILLARY LYMPH NODE DISSECTION;  Surgeon: Autumn Messing III, MD;  Location: [place];  Service: General;  Laterality: Left;  . Port-a-cath removal Right 03/15/2015    Procedure: REMOVAL PORT-A-CATH;  Surgeon: Autumn Messing III, MD;  Location: [place];  Service: General;  Laterality: Right;  . Breast biopsy Left 08/06/14    There were no vitals filed for this visit.      Subjective Assessment - 04/15/15 1122    Subjective My arms are doing better. Next Tuesday is my last because I have to go in for surgery but I will pick back up when I get clearance from my doctor.   Pertinent History Left lumpectomy on 03/15/15 showed G2 invasive ductal carcinoma, 2cm, G2, and DCIS, G2. the lateral margin was positive for invasive tumor, 1 out of 15 nodes was positive. Pt requires another breast surgery in the next couple of weeks to clean margins. She also will begin radiation in approx 8-10 weeks.    Patient Stated Goals for LUE to move like RUE   Currently in Pain? No/denies   Pain Score 0-No pain                         [facility] Adult PT Treatment/Exercise - 04/15/15 0001    Shoulder Exercises: Pulleys   Flexion 2 minutes   ABduction 2 minutes  increased difficulty with this   Shoulder  Exercises: Therapy Ball   Flexion 10 reps   Shoulder Exercises: ROM/Strengthening   Other ROM/Strengthening Exercises Finger Ladder for Lt UE abduction x5 reps (up to #21)   Shoulder Exercises: Stretch   Corner Stretch 3 reps;10 seconds   Other Shoulder Stretches Lt Ue neural tension stretch on wall 3 reps 5 second holds, very tight/tingling for pt   Manual Therapy   Passive ROM In Supine with HOB elevated: Lt shoulder in to flexion, abduction and ER all to pts tolerance.                PT Education - 04/15/15 1201    Education provided Yes   Education Details wearing TG soft to improve comfort in LUE   Person(s) Educated Patient   Methods Explanation;Demonstration   Comprehension  Verbalized understanding           Short Term Clinic Goals - 04/05/15 1250    CC Short Term Goal  #1   Title Pt to demonstrate 120 degrees of left shoulder flexion to allow pt to reach items overhead.   Baseline 89 degrees   Time 4   Period Weeks   Status New   CC Short Term Goal  #2   Title Pt to demonstrate 120 degrees of left shoulder abduction to allow pt to reach items out to sides   Baseline 84   Time 4   Period Weeks   Status New   CC Short Term Goal  #3   Title Pt to be able to verbalize lymphedema risk reduction strategies   Baseline unable   Time 4   Period Weeks   Status New             Long Term Clinic Goals - 04/13/15 1127    CC Long Term Goal  #1   Title Pt to demonstrate 155 degrees of left shoulder flexion to allow pt to reach items on shelves   Baseline 89, 121 degrees 04/13/15   Status On-going   CC Long Term Goal  #2   Title Pt to demonstrate 165 degrees of left shoulder abduction to allow pt to return to prior level of function   Baseline 84, 96 degrees 04/13/15   Status On-going   CC Long Term Goal  #3   Title Pt to demonstrate 85 degrees of left shoulder IR to allow pt to return to prior level of function   Status On-going   CC Long Term Goal  #4   Title Pt to be independent with a home exercise program for strengthening and ROM for long term management    Status On-going   CC Long Term Goal  #5   Title Pt to demonstrate gross LUE of 5/5 to allow pt to return to PLOF   Status On-going            Plan - 04/15/15 1205    Clinical Impression Statement Pt arrived to appointment late. Pt having discomfort at medial/lateral antecubital fossa on left UE. Upon palpation numerous cords could be felt. There is some edema in this area. She may benefit from edema management when she returns from her next surgery. Pt educated to perform dowel rod exercises at home to help alleviate cording and increase comfort. Pt given TG soft to wear on LUE to help  increase comfort.    Rehab Potential Good   Clinical Impairments Affecting Rehab Potential pt to have second breast surgery to clean margins, pt to begin radiation in 8-10  weeks   PT Frequency 2x / week   PT Duration 8 weeks   PT Treatment/Interventions Manual techniques;Therapeutic exercise;Taping;Electrical Stimulation;Passive range of motion;Scar mobilization   PT Next Visit Plan PROM/AAROM/AROM to left shoulder, add to HEP as needed, begin e stim to feet, ask about TG soft    PT Home Exercise Plan supine AAROM with dowel   Consulted and Agree with Plan of Care Patient      Patient will benefit from skilled therapeutic intervention in order to improve the following deficits and impairments:  Decreased range of motion, Pain, Impaired UE functional use, Decreased strength  Visit Diagnosis: Left shoulder pain  Stiffness of left shoulder, not elsewhere classified  Pain in left foot  Pain in right foot     Problem List Patient Active Problem List   Diagnosis Date Noted  . Breast cancer, female, left 03/15/2015  . UTI (urinary tract infection) 12/02/2014  . Chemotherapy induced nausea and vomiting 11/03/2014  . Anemia due to antineoplastic chemotherapy 11/03/2014  . Genetic testing 10/13/2014  . Family history of breast cancer   . Family history of pancreatic cancer   . Family history of breast cancer in female   . Breast cancer of upper-inner quadrant of left female breast ([place]) 08/16/2014  . BP (high blood pressure) 04/17/2014  . NSTEMI (non-ST elevated myocardial infarction) ([place]) 03/16/2014  . 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 Chest pain 09/04/2012  . Essential hypertension, benign 09/04/2012  . Angina pectoris with documented spasm ([place]) 03/05/2012  . Coronary artery spasm ([place]) 03/05/2012  . Arthritis of knee, degenerative 12/04/2011    Alexia Freestone 04/15/2015, 12:11 PM  [place], [place], [address] Phone: [phone]   Fax:  [phone]  Name: Mary Randall MRN: [id number] Date of Birth: 1955/09/19    Allyson Sabal, PT 04/15/2015 12:11 PM

## 2015-04-16 ENCOUNTER — Other Ambulatory Visit: Payer: Self-pay | Admitting: *Deleted

## 2015-04-16 MED ORDER — ATORVASTATIN CALCIUM 40 MG PO TABS: 40.0000 mg | ORAL_TABLET | Freq: Every day | ORAL | Status: DC

## 2015-04-17 ENCOUNTER — Emergency Department (HOSPITAL_COMMUNITY)
Admission: EM | Admit: 2015-04-17 | Discharge: 2015-04-17 | Disposition: A | Discharge status: Home / Self Care | Payer: Federal, State, Local not specified - PPO | Attending: Emergency Medicine | Admitting: Emergency Medicine

## 2015-04-17 ENCOUNTER — Emergency Department (HOSPITAL_COMMUNITY): Payer: Federal, State, Local not specified - PPO

## 2015-04-17 ENCOUNTER — Encounter (HOSPITAL_COMMUNITY): Payer: Self-pay | Admitting: Emergency Medicine

## 2015-04-17 DIAGNOSIS — I252 Old myocardial infarction: Secondary | ICD-10-CM | POA: Insufficient documentation

## 2015-04-17 DIAGNOSIS — R600 Localized edema: Secondary | ICD-10-CM | POA: Insufficient documentation

## 2015-04-17 DIAGNOSIS — Z79899 Other long term (current) drug therapy: Secondary | ICD-10-CM | POA: Diagnosis not present

## 2015-04-17 DIAGNOSIS — R509 Fever, unspecified: Secondary | ICD-10-CM | POA: Diagnosis not present

## 2015-04-17 DIAGNOSIS — G8918 Other acute postprocedural pain: Secondary | ICD-10-CM | POA: Diagnosis not present

## 2015-04-17 DIAGNOSIS — G629 Polyneuropathy, unspecified: Secondary | ICD-10-CM | POA: Insufficient documentation

## 2015-04-17 DIAGNOSIS — M79602 Pain in left arm: Secondary | ICD-10-CM | POA: Insufficient documentation

## 2015-04-17 DIAGNOSIS — N390 Urinary tract infection, site not specified: Secondary | ICD-10-CM | POA: Diagnosis not present

## 2015-04-17 DIAGNOSIS — R1110 Vomiting, unspecified: Secondary | ICD-10-CM | POA: Diagnosis present

## 2015-04-17 DIAGNOSIS — Z8701 Personal history of pneumonia (recurrent): Secondary | ICD-10-CM | POA: Diagnosis not present

## 2015-04-17 DIAGNOSIS — R05 Cough: Secondary | ICD-10-CM | POA: Insufficient documentation

## 2015-04-17 DIAGNOSIS — R112 Nausea with vomiting, unspecified: Secondary | ICD-10-CM | POA: Diagnosis not present

## 2015-04-17 DIAGNOSIS — J3489 Other specified disorders of nose and nasal sinuses: Secondary | ICD-10-CM | POA: Diagnosis not present

## 2015-04-17 DIAGNOSIS — R000 Tachycardia, unspecified: Secondary | ICD-10-CM | POA: Diagnosis not present

## 2015-04-17 DIAGNOSIS — Z8739 Personal history of other diseases of the musculoskeletal system and connective tissue: Secondary | ICD-10-CM | POA: Diagnosis not present

## 2015-04-17 DIAGNOSIS — Z853 Personal history of malignant neoplasm of breast: Secondary | ICD-10-CM | POA: Insufficient documentation

## 2015-04-17 DIAGNOSIS — Z9889 Other specified postprocedural states: Secondary | ICD-10-CM | POA: Diagnosis not present

## 2015-04-17 DIAGNOSIS — I10 Essential (primary) hypertension: Secondary | ICD-10-CM | POA: Insufficient documentation

## 2015-04-17 DIAGNOSIS — N12 Tubulo-interstitial nephritis, not specified as acute or chronic: Secondary | ICD-10-CM | POA: Diagnosis not present

## 2015-04-17 LAB — CBC WITH DIFFERENTIAL/PLATELET
BASOS ABS: 0 10*3/uL (ref 0.0–0.1)
BASOS PCT: 0 %
EOS PCT: 0 %
Eosinophils Absolute: 0 10*3/uL (ref 0.0–0.7)
HCT: 35.5 % — ABNORMAL LOW (ref 36.0–46.0)
Hemoglobin: 11.5 g/dL — ABNORMAL LOW (ref 12.0–15.0)
Lymphocytes Relative: 21 %
Lymphs Abs: 1.2 10*3/uL (ref 0.7–4.0)
MCH: 28.4 pg (ref 26.0–34.0)
MCHC: 32.4 g/dL (ref 30.0–36.0)
MCV: 87.7 fL (ref 78.0–100.0)
MONO ABS: 0.7 10*3/uL (ref 0.1–1.0)
Monocytes Relative: 12 %
Neutro Abs: 3.8 10*3/uL (ref 1.7–7.7)
Neutrophils Relative %: 67 %
PLATELETS: 182 10*3/uL (ref 150–400)
RBC: 4.05 MIL/uL (ref 3.87–5.11)
RDW: 14.5 % (ref 11.5–15.5)
WBC: 5.6 10*3/uL (ref 4.0–10.5)

## 2015-04-17 LAB — URINALYSIS, ROUTINE W REFLEX MICROSCOPIC
BILIRUBIN URINE: NEGATIVE
GLUCOSE, UA: NEGATIVE mg/dL
HGB URINE DIPSTICK: NEGATIVE
KETONES UR: NEGATIVE mg/dL
Nitrite: NEGATIVE
PROTEIN: NEGATIVE mg/dL
Specific Gravity, Urine: 1.01 (ref 1.005–1.030)
pH: 7.5 (ref 5.0–8.0)

## 2015-04-17 LAB — COMPREHENSIVE METABOLIC PANEL
ALBUMIN: 3.9 g/dL (ref 3.5–5.0)
ALK PHOS: 73 U/L (ref 38–126)
ALT: 17 U/L (ref 14–54)
ANION GAP: 12 (ref 5–15)
AST: 21 U/L (ref 15–41)
BILIRUBIN TOTAL: 0.6 mg/dL (ref 0.3–1.2)
BUN: 9 mg/dL (ref 6–20)
CALCIUM: 10.7 mg/dL — AB (ref 8.9–10.3)
CO2: 23 mmol/L (ref 22–32)
CREATININE: 1.26 mg/dL — AB (ref 0.44–1.00)
Chloride: 103 mmol/L (ref 101–111)
GFR calc Af Amer: 53 mL/min — ABNORMAL LOW (ref >60)
GFR calc non Af Amer: 46 mL/min — ABNORMAL LOW (ref >60)
GLUCOSE: 90 mg/dL (ref 65–99)
Potassium: 4 mmol/L (ref 3.5–5.1)
Sodium: 138 mmol/L (ref 135–145)
TOTAL PROTEIN: 7 g/dL (ref 6.5–8.1)

## 2015-04-17 LAB — URINE MICROSCOPIC-ADD ON: Bacteria, UA: NONE SEEN

## 2015-04-17 LAB — I-STAT CG4 LACTIC ACID, ED: Lactic Acid, Venous: 1.92 mmol/L (ref 0.5–2.0)

## 2015-04-17 MED ORDER — SODIUM CHLORIDE 0.9 % IV BOLUS (SEPSIS)
1000.0000 mL | Freq: Once | INTRAVENOUS | Status: AC
  Administered 2015-04-17: 1000 mL via INTRAVENOUS

## 2015-04-17 MED ORDER — CEPHALEXIN 500 MG PO CAPS: 500.0000 mg | ORAL_CAPSULE | Freq: Three times a day (TID) | ORAL | Status: AC

## 2015-04-17 MED ORDER — ONDANSETRON HCL 4 MG/2ML IJ SOLN
4.0000 mg | Freq: Once | INTRAMUSCULAR | Status: AC
  Administered 2015-04-17: 4 mg via INTRAVENOUS
  Filled 2015-04-17: qty 2

## 2015-04-17 MED ORDER — ONDANSETRON 4 MG PO TBDP: 4.0000 mg | ORAL_TABLET | Freq: Three times a day (TID) | ORAL | Status: DC | PRN

## 2015-04-17 NOTE — ED Provider Notes (Signed)
CSN: [id number]     Arrival date & time 04/17/15  1428 History   First MD Initiated Contact with Patient 04/17/15 1602     No chief complaint on file.    (Consider location/radiation/quality/duration/timing/severity/associated sxs/prior Treatment) HPI Comments: A couple of days ago began to have cough, nonproductive.  Today, began running a fever at home 101.4 and had one episode of emesis today, stomach contents.  No abd pain, diarrhea, constipation.  Had runny nose this morning. No sore throat. No SOB/no chest pain.  No dysuria/flank pain.    The history is provided by the patient.    Past Medical History  Diagnosis Date  . Hypertension   . 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 Family history of breast cancer   . Family history of pancreatic cancer   . Family history of breast cancer in female   . Angina effort ([place])     none recent  . NSTEMI (non-ST elevated myocardial infarction) ([place]) 03/16/2014    non-obs CAD, spasm seen  . Pneumonia 2009  . Arthritis     "right knee" (03/16/2015)  . Breast cancer, left ([place]) dx'd 2016   Past Surgical History  Procedure Laterality Date  . Ectopic pregnancy surgery  1991    Tubal pregnancy  . Cardiac catheterization  2010; 03/16/2014  . Tubal ligation  1981  . Left heart catheterization with coronary angiogram N/A 03/16/2014    Procedure: LEFT HEART CATHETERIZATION WITH CORONARY ANGIOGRAM;  Surgeon: Sherren Mocha, MD; Non-obs CAD, EF 55%  . Bunionectomy Bilateral   . Portacath placement N/A 08/28/2014    Procedure: INSERTION PORT-A-CATH;  Surgeon: Autumn Messing III, MD;  Location: [facility];  Service: General;  Laterality: N/A;  . Breast lumpectomy with needle localization and axillary lymph node dissection Left 03/15/2015    Procedure: LEFT BREAST LUMPECTOMY WITH BRACKETED NEEDLE LOCALIZATION AND AXILLARY LYMPH NODE DISSECTION;  Surgeon: Autumn Messing III, MD;  Location: [facility];  Service: General;  Laterality: Left;  .  Port-a-cath removal Right 03/15/2015    Procedure: REMOVAL PORT-A-CATH;  Surgeon: Autumn Messing III, MD;  Location: [facility];  Service: General;  Laterality: Right;  . Breast biopsy Left 08/06/14   Family History  Problem Relation Age of Onset  . Pancreatic cancer Brother 65  . Cancer Brother 40    breast cancer   . Cancer Mother 1    colon cancer   . Hypertension Mother   . Diabetes type II Mother   . CVA Father   . Healthy Sister   . Healthy Sister    Social History  Substance Use Topics  . Smoking status: Never Smoker   . Smokeless tobacco: Never Used  . Alcohol Use: No   OB History    No data available     Review of Systems  Constitutional: Positive for fever and chills.  HENT: Positive for congestion and rhinorrhea. Negative for sore throat.   Eyes: Negative for visual disturbance.  Respiratory: Positive for cough. Negative for shortness of breath.   Cardiovascular: Negative for chest pain.  Gastrointestinal: Positive for nausea and vomiting. Negative for abdominal pain, diarrhea and constipation.  Genitourinary: Negative for difficulty urinating.  Musculoskeletal: Negative for back pain and neck pain. Arthralgias: left arm still sore after lumpectomy.  Skin: Negative for rash.  Neurological: Negative for syncope and headaches. Numbness: neuropathy fingers/toes neuropathy.      Allergies  Carvedilol; Codeine; Percocet; Adhesive; and Compazine  Home Medications  Prior to Admission medications   Medication Sig Start Date End Date Taking? Authorizing Provider  amLODipine (NORVASC) 10 MG tablet Take 1 tablet (10 mg total) by mouth every evening. Patient taking differently: Take 10 mg by mouth daily before supper.  04/15/15  Yes Sherren Mocha, MD  Ascorbic Acid (VITAMIN C ADULT GUMMIES PO) Take 2 tablets by mouth daily.   Yes Historical Provider, MD  atorvastatin (LIPITOR) 40 MG tablet Take 1 tablet (40 mg total) by mouth daily at 6 PM. Patient taking differently: Take 40  mg by mouth daily before supper.  04/16/15  Yes Sherren Mocha, MD  hydrochlorothiazide (HYDRODIURIL) 25 MG tablet Take 25 mg by mouth daily before supper.    Yes Historical Provider, MD  irbesartan (AVAPRO) 300 MG tablet take 1 tablet by mouth once daily Patient taking differently: take 1 tablet by mouth daily before supper 01/28/15  Yes Sherren Mocha, MD  isosorbide mononitrate (IMDUR) 30 MG 24 hr tablet Take 1 tablet (30 mg total) by mouth daily. Patient taking differently: Take 30 mg by mouth daily before supper.  10/06/14  Yes Sherren Mocha, MD  nitroGLYCERIN (NITROSTAT) 0.4 MG SL tablet Place 1 tablet (0.4 mg total) under the tongue every 5 (five) minutes as needed for chest pain. 03/17/14  Yes Rhonda G Barrett, PA-C  potassium chloride (K-DUR) 10 MEQ tablet Take 1 tablet (10 mEq total) by mouth every evening. Patient taking differently: Take 10 mEq by mouth daily before supper.  04/15/15  Yes Sherren Mocha, MD  cephALEXin (KEFLEX) 500 MG capsule Take 1 capsule (500 mg total) by mouth 3 (three) times daily. 04/17/15 05/01/15  Gareth Morgan, MD  gabapentin (NEURONTIN) 100 MG capsule Take 1 capsule (100 mg total) by mouth 3 (three) times daily. Patient not taking: Reported on 02/17/2015 02/12/15   Truitt Merle, MD  ondansetron (ZOFRAN ODT) 4 MG disintegrating tablet Take 1 tablet (4 mg total) by mouth every 8 (eight) hours as needed for nausea or vomiting. 04/17/15   Gareth Morgan, MD  ondansetron (ZOFRAN) 8 MG tablet Take 1 tablet (8 mg total) by mouth 2 (two) times daily as needed for nausea or vomiting. Start on 3rd day after Chemotherapy Patient not taking: Reported on 04/17/2015 09/02/14   Truitt Merle, MD  traMADol (ULTRAM) 50 MG tablet Take 0.5-1 tablets (25-50 mg total) by mouth every 4 (four) hours as needed for moderate pain or severe pain. Patient not taking: Reported on 04/17/2015 03/17/15   Autumn Messing III, MD   BP 134/86 mmHg  Pulse 89  Temp(Src) 99.6 F (37.6 C) (Oral)  Resp 20  Ht 5\' 7"   (1.702 m)  Wt 176 lb 4 oz (79.946 kg)  BMI 27.60 kg/m2  SpO2 98% Physical Exam  Constitutional: She is oriented to person, place, and time. She appears well-developed and well-nourished. No distress.  HENT:  Head: Normocephalic and atraumatic.  Eyes: Conjunctivae and EOM are normal.  Neck: Normal range of motion.  Cardiovascular: Regular rhythm, normal heart sounds and intact distal pulses.  Tachycardia present.  Exam reveals no gallop and no friction rub.   No murmur heard. Pulmonary/Chest: Effort normal and breath sounds normal. No respiratory distress. She has no wheezes. She has no rales.  Abdominal: Soft. She exhibits no distension. There is no tenderness. There is no guarding and no CVA tenderness.  Musculoskeletal: She exhibits edema (trace bilateral (sock lines)). She exhibits no tenderness.  Neurological: She is alert and oriented to person, place, and time.  Skin: Skin is  warm and dry. No rash noted. She is not diaphoretic. No erythema.  Nursing note and vitals reviewed.   ED Course  Procedures (including critical care time) Labs Review Labs Reviewed  COMPREHENSIVE METABOLIC PANEL - Abnormal; Notable for the following:    Creatinine, Ser 1.26 (*)    Calcium 10.7 (*)    GFR calc non Af Amer 46 (*)    GFR calc Af Amer 53 (*)    All other components within normal limits  URINALYSIS, ROUTINE W REFLEX MICROSCOPIC (NOT AT [facility]) - Abnormal; Notable for the following:    Leukocytes, UA SMALL (*)    All other components within normal limits  CBC WITH DIFFERENTIAL/PLATELET - Abnormal; Notable for the following:    Hemoglobin 11.5 (*)    HCT 35.5 (*)    All other components within normal limits  URINE MICROSCOPIC-ADD ON - Abnormal; Notable for the following:    Squamous Epithelial / LPF 0-5 (*)    All other components within normal limits  CULTURE, BLOOD (ROUTINE X 2)  CULTURE, BLOOD (ROUTINE X 2)  URINE CULTURE  I-STAT CG4 LACTIC ACID, ED    Imaging Review Dg Chest 2  View  04/17/2015  CLINICAL DATA:  Patient with cough and fever. EXAM: CHEST  2 VIEW COMPARISON:  Chest radiograph 11/01/2014 FINDINGS: Stable cardiac and mediastinal contours. No consolidative pulmonary opacities. No pleural effusion or pneumothorax. Mid thoracic spine degenerative changes. Left chest wall surgical clips. IMPRESSION: No active cardiopulmonary disease. Electronically Signed   By: Lovey Newcomer M.D.   On: 04/17/2015 15:37   I have personally reviewed and evaluated these images and lab results as part of my medical decision-making.   EKG Interpretation None      MDM   Final diagnoses:  Pyelonephritis  Non-intractable vomiting with nausea, vomiting of unspecified type  Urinary tract infection without hematuria, site unspecified   60 year old female with a history of an NSTEMI secondary to coronary vasospasm, hypertension, breast cancer for which she stopped chemotherapy back in January, presents with concern for cough for 3 days, with fever, nausea and one episode of vomiting today. Chest x-ray shows no sign of pneumonia, patient is not hypoxic, not, nor short of breath. She does not have any significant facial pain to suggest acute sinusitis. Urinalysis was done which showed likely UTI. Laboratory work was done which showed a normal lactic acid, normal renal function for patient and normal WBC.  Patient also likely with a viral URI similar to her grandson.  Patient with tachycardia, likely secondary to dehydration and was given a bolus of normal saline with improvement.  Given 14 days of keflex, rx for zofran. Patient discharged in stable condition with understanding of reasons to return.   Gareth Morgan, MD 04/18/15 [phone]

## 2015-04-17 NOTE — ED Notes (Signed)
Pt stable, ambulatory, states understanding of discharge instructions, family at bedside. 

## 2015-04-17 NOTE — ED Notes (Signed)
Two days of cough, fever at home of 101.4, N/V. Finished chemo for breast cancer in January.

## 2015-04-19 LAB — URINE CULTURE

## 2015-04-20 ENCOUNTER — Telehealth: Payer: Self-pay | Admitting: *Deleted

## 2015-04-20 NOTE — Anesthesia Preprocedure Evaluation (Signed)
Anesthesia Evaluation  Patient identified by MRN, date of birth, ID band Patient awake    Reviewed: Allergy & Precautions, H&P , NPO status , Patient's Chart, lab work & pertinent test results, reviewed documented beta blocker date and time   Airway Mallampati: II  TM Distance: >3 FB Neck ROM: full    Dental no notable dental hx. (+) Dental Advisory Given, Teeth Intact   Pulmonary neg pulmonary ROS,    Pulmonary exam normal breath sounds clear to auscultation       Cardiovascular Exercise Tolerance: Good hypertension, Pt. on medications + angina + CAD and + Past MI  Normal cardiovascular exam Rhythm:regular Rate:Normal  Prinzmetal angina   Neuro/Psych negative neurological ROS  negative psych ROS   GI/Hepatic negative GI ROS, Neg liver ROS,   Endo/Other  negative endocrine ROS  Renal/GU negative Renal ROS  negative genitourinary   Musculoskeletal   Abdominal   Peds  Hematology negative hematology ROS (+)   Anesthesia Other Findings   Reproductive/Obstetrics negative OB ROS                             Anesthesia Physical Anesthesia Plan  ASA: III  Anesthesia Plan: General   Post-op Pain Management:    Induction: Intravenous  Airway Management Planned: Oral ETT  Additional Equipment:   Intra-op Plan:   Post-operative Plan: Extubation in OR  Informed Consent: I have reviewed the patients History and Physical, chart, labs and discussed the procedure including the risks, benefits and alternatives for the proposed anesthesia with the patient or authorized representative who has indicated his/her understanding and acceptance.   Dental Advisory Given  Plan Discussed with: CRNA  Anesthesia Plan Comments:         Anesthesia Quick Evaluation

## 2015-04-20 NOTE — ED Notes (Signed)
Post ED Visit - Positive Culture Follow-up  Culture report reviewed by antimicrobial stewardship pharmacist:  []  Elenor Quinones, Pharm.D. []  Heide Guile, Pharm.D., BCPS []  Parks Neptune, Pharm.D. []  Alycia Rossetti, Pharm.D., BCPS []  [place], Pharm.D., BCPS, AAHIVP [x]  Legrand Como, Pharm.D., BCPS, AAHIVP []  Milus Glazier, Pharm.D. []  Stephens November, Pharm.D.  Positive urine culture Treated with Cephalexin  organism sensitive to the same and no further patient follow-up is required at this time.  Harlon Flor [facility] 04/20/2015, 11:07 AM

## 2015-04-21 ENCOUNTER — Encounter (HOSPITAL_COMMUNITY): Payer: Self-pay

## 2015-04-21 ENCOUNTER — Ambulatory Visit (HOSPITAL_COMMUNITY): Payer: Federal, State, Local not specified - PPO | Admitting: Anesthesiology

## 2015-04-21 ENCOUNTER — Encounter (HOSPITAL_COMMUNITY)
Admission: RE | Disposition: A | Discharge status: Home / Self Care | Payer: Self-pay | Source: Ambulatory Visit | Attending: General Surgery

## 2015-04-21 ENCOUNTER — Ambulatory Visit (HOSPITAL_COMMUNITY)
Admission: RE | Admit: 2015-04-21 | Discharge: 2015-04-21 | Disposition: A | Discharge status: Home / Self Care | Payer: Federal, State, Local not specified - PPO | Source: Ambulatory Visit | Attending: General Surgery | Admitting: General Surgery

## 2015-04-21 DIAGNOSIS — C50212 Malignant neoplasm of upper-inner quadrant of left female breast: Secondary | ICD-10-CM | POA: Diagnosis not present

## 2015-04-21 DIAGNOSIS — I252 Old myocardial infarction: Secondary | ICD-10-CM | POA: Insufficient documentation

## 2015-04-21 DIAGNOSIS — I2510 Atherosclerotic heart disease of native coronary artery without angina pectoris: Secondary | ICD-10-CM | POA: Insufficient documentation

## 2015-04-21 DIAGNOSIS — C50912 Malignant neoplasm of unspecified site of left female breast: Secondary | ICD-10-CM | POA: Insufficient documentation

## 2015-04-21 DIAGNOSIS — Z170 Estrogen receptor positive status [ER+]: Secondary | ICD-10-CM | POA: Diagnosis not present

## 2015-04-21 DIAGNOSIS — I10 Essential (primary) hypertension: Secondary | ICD-10-CM | POA: Insufficient documentation

## 2015-04-21 DIAGNOSIS — Z79899 Other long term (current) drug therapy: Secondary | ICD-10-CM | POA: Insufficient documentation

## 2015-04-21 HISTORY — PX: RE-EXCISION OF BREAST CANCER,SUPERIOR MARGINS: SHX6047

## 2015-04-21 SURGERY — RE-EXCISION OF BREAST CANCER,SUPERIOR MARGINS
Anesthesia: General | Site: Breast | Laterality: Left

## 2015-04-21 MED ORDER — HYDROMORPHONE HCL 1 MG/ML IJ SOLN
1.0000 mg | INTRAMUSCULAR | Status: DC | PRN
  Administered 2015-04-21 (×2): 0.5 mg via INTRAVENOUS

## 2015-04-21 MED ORDER — FENTANYL CITRATE (PF) 100 MCG/2ML IJ SOLN
INTRAMUSCULAR | Status: AC
  Filled 2015-04-21: qty 2

## 2015-04-21 MED ORDER — BUPIVACAINE-EPINEPHRINE 0.25% -1:200000 IJ SOLN
INTRAMUSCULAR | Status: AC
  Filled 2015-04-21: qty 1

## 2015-04-21 MED ORDER — LIDOCAINE HCL (CARDIAC) 20 MG/ML IV SOLN
INTRAVENOUS | Status: AC
  Filled 2015-04-21: qty 5

## 2015-04-21 MED ORDER — MIDAZOLAM HCL 2 MG/2ML IJ SOLN
INTRAMUSCULAR | Status: AC
Start: 2015-04-21 — End: 2015-04-21
  Filled 2015-04-21: qty 2

## 2015-04-21 MED ORDER — BUPIVACAINE-EPINEPHRINE 0.25% -1:200000 IJ SOLN
INTRAMUSCULAR | Status: DC | PRN
  Administered 2015-04-21: 20 mL

## 2015-04-21 MED ORDER — ONDANSETRON HCL 4 MG/2ML IJ SOLN
INTRAMUSCULAR | Status: DC | PRN
  Administered 2015-04-21: 4 mg via INTRAVENOUS

## 2015-04-21 MED ORDER — CHLORHEXIDINE GLUCONATE 4 % EX LIQD: 1.0000 "application " | Freq: Once | CUTANEOUS | Status: DC

## 2015-04-21 MED ORDER — ONDANSETRON HCL 4 MG/2ML IJ SOLN
INTRAMUSCULAR | Status: AC
  Filled 2015-04-21: qty 2

## 2015-04-21 MED ORDER — EPHEDRINE SULFATE 50 MG/ML IJ SOLN
INTRAMUSCULAR | Status: AC
  Filled 2015-04-21: qty 1

## 2015-04-21 MED ORDER — DEXAMETHASONE SODIUM PHOSPHATE 10 MG/ML IJ SOLN
INTRAMUSCULAR | Status: DC | PRN
  Administered 2015-04-21: 10 mg via INTRAVENOUS

## 2015-04-21 MED ORDER — HYDROMORPHONE HCL 1 MG/ML IJ SOLN
INTRAMUSCULAR | Status: AC
  Filled 2015-04-21: qty 1

## 2015-04-21 MED ORDER — CEFAZOLIN SODIUM-DEXTROSE 2-4 GM/100ML-% IV SOLN
2.0000 g | INTRAVENOUS | Status: AC
  Administered 2015-04-21: 2 g via INTRAVENOUS
  Filled 2015-04-21: qty 100

## 2015-04-21 MED ORDER — PROMETHAZINE HCL 25 MG/ML IJ SOLN
6.2500 mg | Freq: Once | INTRAMUSCULAR | Status: AC
  Administered 2015-04-21: 6.25 mg via INTRAVENOUS
  Filled 2015-04-21: qty 1

## 2015-04-21 MED ORDER — SODIUM CHLORIDE 0.9 % IJ SOLN
INTRAMUSCULAR | Status: AC
  Filled 2015-04-21: qty 10

## 2015-04-21 MED ORDER — LACTATED RINGERS IV SOLN: INTRAVENOUS | Status: DC

## 2015-04-21 MED ORDER — LIDOCAINE HCL (CARDIAC) 20 MG/ML IV SOLN
INTRAVENOUS | Status: DC | PRN
  Administered 2015-04-21: 100 mg via INTRAVENOUS

## 2015-04-21 MED ORDER — MIDAZOLAM HCL 5 MG/5ML IJ SOLN
INTRAMUSCULAR | Status: DC | PRN
  Administered 2015-04-21: 2 mg via INTRAVENOUS

## 2015-04-21 MED ORDER — LACTATED RINGERS IV SOLN
INTRAVENOUS | Status: DC | PRN
  Administered 2015-04-21: 10:00:00 via INTRAVENOUS

## 2015-04-21 MED ORDER — HYDROCODONE-ACETAMINOPHEN 5-325 MG PO TABS: 1.0000 | ORAL_TABLET | Freq: Four times a day (QID) | ORAL | Status: DC | PRN

## 2015-04-21 MED ORDER — PROPOFOL 10 MG/ML IV BOLUS
INTRAVENOUS | Status: DC | PRN
  Administered 2015-04-21: 200 mg via INTRAVENOUS

## 2015-04-21 MED ORDER — PROPOFOL 10 MG/ML IV BOLUS
INTRAVENOUS | Status: AC
  Filled 2015-04-21: qty 20

## 2015-04-21 MED ORDER — FENTANYL CITRATE (PF) 250 MCG/5ML IJ SOLN
INTRAMUSCULAR | Status: AC
Start: 2015-04-21 — End: 2015-04-21
  Filled 2015-04-21: qty 5

## 2015-04-21 MED ORDER — DEXAMETHASONE SODIUM PHOSPHATE 10 MG/ML IJ SOLN
INTRAMUSCULAR | Status: AC
Start: 2015-04-21 — End: 2015-04-21
  Filled 2015-04-21: qty 1

## 2015-04-21 MED ORDER — PHENYLEPHRINE HCL 10 MG/ML IJ SOLN
INTRAMUSCULAR | Status: DC | PRN
  Administered 2015-04-21 (×3): 80 ug via INTRAVENOUS

## 2015-04-21 MED ORDER — CEFAZOLIN SODIUM-DEXTROSE 2-4 GM/100ML-% IV SOLN
INTRAVENOUS | Status: AC
  Filled 2015-04-21: qty 100

## 2015-04-21 MED ORDER — FENTANYL CITRATE (PF) 100 MCG/2ML IJ SOLN
25.0000 ug | INTRAMUSCULAR | Status: DC | PRN
  Administered 2015-04-21: 50 ug via INTRAVENOUS
  Administered 2015-04-21 (×2): 25 ug via INTRAVENOUS

## 2015-04-21 MED ORDER — FENTANYL CITRATE (PF) 100 MCG/2ML IJ SOLN
INTRAMUSCULAR | Status: DC | PRN
  Administered 2015-04-21 (×2): 50 ug via INTRAVENOUS

## 2015-04-21 SURGICAL SUPPLY — 34 items
BANDAGE ACE 6X5 VEL STRL LF (GAUZE/BANDAGES/DRESSINGS) ×2 IMPLANT
BLADE SURG 15 STRL LF DISP TIS (BLADE) ×3 IMPLANT
BLADE SURG 15 STRL SS (BLADE) ×3
CLEANER TIP ELECTROSURG 2X2 (MISCELLANEOUS) ×2 IMPLANT
COVER SURGICAL LIGHT HANDLE (MISCELLANEOUS) ×2 IMPLANT
DECANTER SPIKE VIAL GLASS SM (MISCELLANEOUS) ×2 IMPLANT
DRAPE LAPAROTOMY TRNSV 102X78 (DRAPE) ×2 IMPLANT
ELECT COATED BLADE 2.86 ST (ELECTRODE) ×2 IMPLANT
ELECT PENCIL ROCKER SW 15FT (MISCELLANEOUS) ×2 IMPLANT
ELECT REM PT RETURN 9FT ADLT (ELECTROSURGICAL) ×2
ELECTRODE REM PT RTRN 9FT ADLT (ELECTROSURGICAL) ×1 IMPLANT
GAUZE SPONGE 4X4 12PLY STRL (GAUZE/BANDAGES/DRESSINGS) ×2 IMPLANT
GAUZE SPONGE 4X4 16PLY XRAY LF (GAUZE/BANDAGES/DRESSINGS) ×2 IMPLANT
GLOVE BIO SURGEON STRL SZ7.5 (GLOVE) ×4 IMPLANT
GLOVE BIOGEL PI IND STRL 7.0 (GLOVE) ×1 IMPLANT
GLOVE BIOGEL PI INDICATOR 7.0 (GLOVE) ×1
GOWN STRL REUS W/ TWL XL LVL3 (GOWN DISPOSABLE) ×1 IMPLANT
GOWN STRL REUS W/TWL LRG LVL3 (GOWN DISPOSABLE) ×2 IMPLANT
GOWN STRL REUS W/TWL XL LVL3 (GOWN DISPOSABLE) ×3 IMPLANT
KIT BASIN OR (CUSTOM PROCEDURE TRAY) ×2 IMPLANT
LIQUID BAND (GAUZE/BANDAGES/DRESSINGS) IMPLANT
MARKER SKIN DUAL TIP RULER LAB (MISCELLANEOUS) ×2 IMPLANT
NEEDLE HYPO 22GX1.5 SAFETY (NEEDLE) ×2 IMPLANT
NEEDLE HYPO 25X1 1.5 SAFETY (NEEDLE) ×2 IMPLANT
PACK BASIC VI WITH GOWN DISP (CUSTOM PROCEDURE TRAY) ×2 IMPLANT
SOL PREP POV-IOD 4OZ 10% (MISCELLANEOUS) ×2 IMPLANT
SPONGE LAP 4X18 X RAY DECT (DISPOSABLE) ×2 IMPLANT
STRIP CLOSURE SKIN 1/2X4 (GAUZE/BANDAGES/DRESSINGS) ×4 IMPLANT
SUT VIC AB 3-0 SH 27 (SUTURE)
SUT VIC AB 3-0 SH 27XBRD (SUTURE) IMPLANT
SYR BULB IRRIGATION 50ML (SYRINGE) ×2 IMPLANT
SYR CONTROL 10ML LL (SYRINGE) ×2 IMPLANT
TOWEL OR 17X26 10 PK STRL BLUE (TOWEL DISPOSABLE) ×2 IMPLANT
YANKAUER SUCT BULB TIP 10FT TU (MISCELLANEOUS) ×2 IMPLANT

## 2015-04-21 NOTE — Progress Notes (Signed)
Patient very teary eyed this am

## 2015-04-21 NOTE — Anesthesia Procedure Notes (Signed)
Procedure Name: LMA Insertion Date/Time: 04/21/2015 10:43 AM Performed by: Maxwell Caul Pre-anesthesia Checklist: Patient identified, Emergency Drugs available, Suction available and Patient being monitored Patient Re-evaluated:Patient Re-evaluated prior to inductionOxygen Delivery Method: Circle system utilized Preoxygenation: Pre-oxygenation with 100% oxygen Intubation Type: IV induction LMA: LMA inserted LMA Size: 4.0 Number of attempts: 1 Placement Confirmation: positive ETCO2 and breath sounds checked- equal and bilateral Tube secured with: Tape Dental Injury: Teeth and Oropharynx as per pre-operative assessment

## 2015-04-21 NOTE — Transfer of Care (Signed)
Immediate Anesthesia Transfer of Care Note  Patient: Mary Randall  Procedure(s) Performed: Procedure(s): RE-EXCISION OF BREAST CANCER, INFERIOR MARGIN (Left)  Patient Location: PACU  Anesthesia Type:General  Level of Consciousness:  sedated, patient cooperative and responds to stimulation  Airway & Oxygen Therapy:Patient Spontanous Breathing and Patient connected to face mask oxgen  Post-op Assessment:  Report given to PACU RN and Post -op Vital signs reviewed and stable  Post vital signs:  Reviewed and stable  Last Vitals:  Filed Vitals:   04/21/15 0844  BP: 128/81  Pulse: 95  Temp: 36.8 C  Resp: 18    Complications: No apparent anesthesia complications

## 2015-04-21 NOTE — Op Note (Signed)
04/21/2015  11:26 AM  PATIENT:  Mary Randall  60 y.o. female  PRE-OPERATIVE DIAGNOSIS:  LEFT BREAST CANCER  POST-OPERATIVE DIAGNOSIS:  LEFT BREAST CANCER  PROCEDURE:  Procedure(s): RE-EXCISION OF BREAST CANCER, INFERIOR MARGIN (Left)  SURGEON:  Surgeon(s) and Role:    * Jovita Kussmaul, MD - Primary  PHYSICIAN ASSISTANT:   ASSISTANTS: none   ANESTHESIA:   general  EBL:     BLOOD ADMINISTERED:none  DRAINS: none   LOCAL MEDICATIONS USED:  MARCAINE     SPECIMEN:  Source of Specimen:  left breast inferior margin  DISPOSITION OF SPECIMEN:  PATHOLOGY  COUNTS:  YES  TOURNIQUET:  * No tourniquets in log *  DICTATION: .Dragon Dictation   After informed written consent was obtained the patient was brought to the operating room and placed in the supine position on the operating room table. After adequate induction of general anesthesia the patient's left breast was prepped with ChloraPrep, allowed to dry, and draped in usual sterile manner. An appropriate timeout was performed. The area around her old incision was infiltrated with quarter percent Marcaine. Her previous incision was opened sharply with a 15 blade knife. The incision was carried through the subcutaneous and breast tissue sharply with the electrocautery until the chest wall was encountered. The inferior margin was reexcised sharply with the electrocautery. A stitch was used to mark the new true surgical margin. This was sent pathology for further evaluation. Hemostasis was achieved using the Bovie electrocautery. The wound was infiltrated with more quarter percent Marcaine. The deep layer of the wound was then closed with layers of interrupted 3-0 Vicryl stitches. The skin was closed with interrupted 4-0 Monocryl subcuticular stitches. Dermabond dressings were applied. The patient tolerated the procedure well. At the end of the case all needle sponge and instrument counts were correct. The patient was then awakened and taken  to recovery in stable condition.  PLAN OF CARE: Discharge to home after PACU  PATIENT DISPOSITION:  PACU - hemodynamically stable.   Delay start of Pharmacological VTE agent (>24hrs) due to surgical blood loss or risk of bleeding: not applicable

## 2015-04-21 NOTE — H&P (Signed)
Mary Randall  Location: [facility] Patient #: [id number] DOB: 08-10-1955 Separated / Language: [name] / Race: Black or African American Female   History of Present Illness  Patient words: po lumpectomy and LND.  The patient is a 60 year old female who presents for a follow-up for Breast cancer. The patient is a 60 year old black female who is about 2 weeks status post left breast lumpectomy and axillary lymph node dissection for a T1 CN I a left breast cancer. She was ER and PR positive and HER-2 negative with a Ki-67 of 50%. She did have a positive inferior margin on her lumpectomy specimen. She is having minimal output from her drain. She complains of pain at the incisions.   Problem List/Past Medical PRIMARY CANCER OF UPPER INNER QUADRANT OF LEFT FEMALE BREAST (C50.212)  Other Problems  Arthritis High blood pressure  Past Surgical History Breast Biopsy Left. Foot Surgery Bilateral. Oral Surgery  Diagnostic Studies History  Colonoscopy never Mammogram within last year Pap Smear 1-5 years ago  Allergies Codeine Phosphate *ANALGESICS - OPIOID*  Medication History Atorvastatin Calcium (40MG Tablet, Oral) Active. AmLODIPine Besylate (10MG Tablet, Oral) Active. Hydrochlorothiazide (25MG Tablet, Oral) Active. Isosorbide Mononitrate ER (30MG Tablet ER 24HR, Oral) Active. Irbesartan (300MG Tablet, Oral) Active. Potassium Chloride ER (10MEQ Tablet ER, Oral) Active. Nitrostat (0.4MG Tab Sublingual, Sublingual) Active. Medications Reconciled  Social History  Alcohol use Occasional alcohol use. Caffeine use Carbonated beverages, Tea. No drug use Tobacco use Never smoker.  Family History  Alcohol Abuse Father. Arthritis Family Members In General, Mother. Breast Cancer Brother. Cerebrovascular Accident Father. Colon Cancer Mother. Diabetes Mellitus Mother. Heart Disease Father. Hypertension Family Members In General, Father,  Mother. Malignant Neoplasm Of Pancreas Brother.  Pregnancy / Birth History  Age at menarche 67 years. Gravida 2 Maternal age 60-25 Para 2    Review of Systems  General Not Present- Appetite Loss, Chills, Fatigue, Fever, Night Sweats, Weight Gain and Weight Loss. Skin Not Present- Change in Wart/Mole, Dryness, Hives, Jaundice, New Lesions, Non-Healing Wounds, Rash and Ulcer. HEENT Present- Wears glasses/contact lenses. Not Present- Earache, Hearing Loss, Hoarseness, Nose Bleed, Oral Ulcers, Ringing in the Ears, Seasonal Allergies, Sinus Pain, Sore Throat, Visual Disturbances and Yellow Eyes. Respiratory Present- Snoring. Not Present- Bloody sputum, Chronic Cough, Difficulty Breathing and Wheezing. Breast Present- Breast Mass. Not Present- Breast Pain, Nipple Discharge and Skin Changes. Cardiovascular Not Present- Chest Pain, Difficulty Breathing Lying Down, Leg Cramps, Palpitations, Rapid Heart Rate, Shortness of Breath and Swelling of Extremities. Gastrointestinal Not Present- Abdominal Pain, Bloating, Bloody Stool, Change in Bowel Habits, Chronic diarrhea, Constipation, Difficulty Swallowing, Excessive gas, Gets full quickly at meals, Hemorrhoids, Indigestion, Nausea, Rectal Pain and Vomiting. Female Genitourinary Not Present- Frequency, Nocturia, Painful Urination, Pelvic Pain and Urgency. Musculoskeletal Not Present- Back Pain, Joint Pain, Joint Stiffness, Muscle Pain, Muscle Weakness and Swelling of Extremities. Neurological Not Present- Decreased Memory, Fainting, Headaches, Numbness, Seizures, Tingling, Tremor, Trouble walking and Weakness. Psychiatric Not Present- Anxiety, Bipolar, Change in Sleep Pattern, Depression, Fearful and Frequent crying. Endocrine Not Present- Cold Intolerance, Excessive Hunger, Hair Changes, Heat Intolerance, Hot flashes and New Diabetes. Hematology Not Present- Easy Bruising, Excessive bleeding, Gland problems, HIV and Persistent  Infections.  Vitals Weight: 174.13 lb Height: 66in Body Surface Area: 1.89 m Body Mass Index: 28.1 kg/m  BP: 118/86 (Sitting, Left Arm, Standard)       Physical Exam General Mental Status-Alert. General Appearance-Consistent with stated age. Hydration-Well hydrated. Voice-Normal.  Head and Neck Head-normocephalic, atraumatic with no  lesions or palpable masses. Trachea-midline. Thyroid Gland Characteristics - normal size and consistency.  Eye Eyeball - Bilateral-Extraocular movements intact. Sclera/Conjunctiva - Bilateral-No scleral icterus.  Chest and Lung Exam Chest and lung exam reveals -quiet, even and easy respiratory effort with no use of accessory muscles and on auscultation, normal breath sounds, no adventitious sounds and normal vocal resonance. Inspection Chest Wall - Normal. Back - normal.  Breast Note: The left breast and axillary incisions are healing nicely with no sign of infection or seroma. The left axillary drain was removed today and she tolerated this well.   Cardiovascular Cardiovascular examination reveals -normal heart sounds, regular rate and rhythm with no murmurs and normal pedal pulses bilaterally.  Abdomen Inspection Inspection of the abdomen reveals - No Hernias. Skin - Scar - no surgical scars. Palpation/Percussion Palpation and Percussion of the abdomen reveal - Soft, Non Tender, No Rebound tenderness, No Rigidity (guarding) and No hepatosplenomegaly. Auscultation Auscultation of the abdomen reveals - Bowel sounds normal.  Neurologic Neurologic evaluation reveals -alert and oriented x 3 with no impairment of recent or remote memory. Mental Status-Normal.  Musculoskeletal Normal Exam - Left-Upper Extremity Strength Normal and Lower Extremity Strength Normal. Normal Exam - Right-Upper Extremity Strength Normal and Lower Extremity Strength Normal.  Lymphatic Head & Neck  General Head & Neck  Lymphatics: Bilateral - Description - Normal. Axillary  General Axillary Region: Bilateral - Description - Normal. Tenderness - Non Tender. Femoral & Inguinal  Generalized Femoral & Inguinal Lymphatics: Bilateral - Description - Normal. Tenderness - Non Tender.    Assessment & Plan PRIMARY CANCER OF UPPER INNER QUADRANT OF LEFT FEMALE BREAST (C50.212) Impression: The patient is 2 weeks status post left breast lumpectomy for breast cancer. She tolerated the surgery well. Her drain was removed today without difficulty. At this point she still has a positive inferior margin at her lumpectomy site. I have recommended that this margin be reexcised. I have discussed this with her in detail including the risks and benefits of surgery as well as some of the technical aspects and she understands and wishes to proceed Current Plans Follow up with [place] in the office in 1 month.  Call us sooner as needed.    Signed by Luella Cook, MD

## 2015-04-21 NOTE — Anesthesia Postprocedure Evaluation (Signed)
Anesthesia Post Note  Patient: Mary Randall  Procedure(s) Performed: Procedure(s) (LRB): RE-EXCISION OF BREAST CANCER, INFERIOR MARGIN (Left)  Patient location during evaluation: PACU Anesthesia Type: General Level of consciousness: awake and alert Pain management: pain level controlled Vital Signs Assessment: post-procedure vital signs reviewed and stable Respiratory status: spontaneous breathing, nonlabored ventilation, respiratory function stable and patient connected to nasal cannula oxygen Cardiovascular status: blood pressure returned to baseline and stable Postop Assessment: no signs of nausea or vomiting Anesthetic complications: no    Last Vitals:  Filed Vitals:   04/21/15 1230 04/21/15 1244  BP: 101/67 118/64  Pulse: 85 80  Temp: 36.6 C 36.6 C  Resp: 20 20    Last Pain:  Filed Vitals:   04/21/15 1306  PainSc: 3                  Lyniah Fujita J

## 2015-04-21 NOTE — Interval H&P Note (Signed)
History and Physical Interval Note:  04/21/2015 10:13 AM  Mary Randall  has presented today for surgery, with the diagnosis of LEFT BREAST CANCER  The various methods of treatment have been discussed with the patient and family. After consideration of risks, benefits and other options for treatment, the patient has consented to  Procedure(s): RE-EXCISION OF BREAST CANCER, INFERIOR MARGIN (Left) as a surgical intervention .  The patient's history has been reviewed, patient examined, no change in status, stable for surgery.  I have reviewed the patient's chart and labs.  Questions were answered to the patient's satisfaction.     TOTH III,Avari Nevares S

## 2015-04-22 LAB — CULTURE, BLOOD (ROUTINE X 2): Culture: NO GROWTH

## 2015-05-05 ENCOUNTER — Ambulatory Visit: Payer: Federal, State, Local not specified - PPO | Admitting: Physical Therapy

## 2015-05-06 ENCOUNTER — Ambulatory Visit: Payer: Federal, State, Local not specified - PPO | Attending: Radiation Oncology | Admitting: Physical Therapy

## 2015-05-06 DIAGNOSIS — M25612 Stiffness of left shoulder, not elsewhere classified: Secondary | ICD-10-CM | POA: Insufficient documentation

## 2015-05-06 DIAGNOSIS — M79671 Pain in right foot: Secondary | ICD-10-CM | POA: Diagnosis not present

## 2015-05-06 DIAGNOSIS — M25512 Pain in left shoulder: Secondary | ICD-10-CM | POA: Diagnosis not present

## 2015-05-06 DIAGNOSIS — M79672 Pain in left foot: Secondary | ICD-10-CM | POA: Insufficient documentation

## 2015-05-06 DIAGNOSIS — R209 Unspecified disturbances of skin sensation: Secondary | ICD-10-CM | POA: Insufficient documentation

## 2015-05-06 DIAGNOSIS — M6281 Muscle weakness (generalized): Secondary | ICD-10-CM | POA: Diagnosis not present

## 2015-05-07 ENCOUNTER — Ambulatory Visit
Admission: RE | Admit: 2015-05-07 | Discharge: 2015-05-07 | Disposition: A | Discharge status: Home / Self Care | Payer: Federal, State, Local not specified - PPO | Source: Ambulatory Visit | Attending: Radiation Oncology | Admitting: Radiation Oncology

## 2015-05-07 ENCOUNTER — Ambulatory Visit: Payer: Self-pay | Admitting: Radiation Oncology

## 2015-05-07 ENCOUNTER — Inpatient Hospital Stay: Admission: RE | Admit: 2015-05-07 | Payer: Self-pay | Source: Ambulatory Visit | Admitting: Radiation Oncology

## 2015-05-07 DIAGNOSIS — Z510 Encounter for antineoplastic radiation therapy: Secondary | ICD-10-CM | POA: Diagnosis not present

## 2015-05-07 DIAGNOSIS — Z170 Estrogen receptor positive status [ER+]: Secondary | ICD-10-CM | POA: Diagnosis not present

## 2015-05-07 DIAGNOSIS — C50212 Malignant neoplasm of upper-inner quadrant of left female breast: Secondary | ICD-10-CM | POA: Diagnosis not present

## 2015-05-07 NOTE — Therapy (Signed)
[place], [place], [address] Phone: [phone]   Fax:  [phone]  Physical Therapy Evaluation  Patient Details  Name: Mary Randall MRN: [id number] Date of Birth: 08-Jun-1955 Referring Provider: Dr. Isidore Moos  Encounter Date: 05/06/2015      PT End of Session - 05/07/15 1311    Visit Number 5   Number of Visits 17   Date for PT Re-Evaluation 07/02/15   PT Start Time 1022   PT Stop Time 1100   PT Time Calculation (min) 38 min   Activity Tolerance Patient tolerated treatment well   Behavior During Therapy [facility] for tasks assessed/performed      Past Medical History  Diagnosis Date  . Hypertension   . 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 Family history of breast cancer   . Family history of pancreatic cancer   . Family history of breast cancer in female   . Angina effort ([place])     none recent  . NSTEMI (non-ST elevated myocardial infarction) ([place]) 03/16/2014    non-obs CAD, spasm seen  . Pneumonia 2009  . Arthritis     "right knee" (03/16/2015)  . Breast cancer, left ([place]) dx'd 2016    Past Surgical History  Procedure Laterality Date  . Ectopic pregnancy surgery  1991    Tubal pregnancy  . Cardiac catheterization  2010; 03/16/2014  . Tubal ligation  1981  . Left heart catheterization with coronary angiogram N/A 03/16/2014    Procedure: LEFT HEART CATHETERIZATION WITH CORONARY ANGIOGRAM;  Surgeon: Sherren Mocha, MD; Non-obs CAD, EF 55%  . Bunionectomy Bilateral   . Portacath placement N/A 08/28/2014    Procedure: INSERTION PORT-A-CATH;  Surgeon: Autumn Messing III, MD;  Location: [place];  Service: General;  Laterality: N/A;  . Breast lumpectomy with needle localization and axillary lymph node dissection Left 03/15/2015    Procedure: LEFT BREAST LUMPECTOMY WITH BRACKETED NEEDLE LOCALIZATION AND AXILLARY LYMPH NODE DISSECTION;  Surgeon: Autumn Messing III, MD;  Location: [place];   Service: General;  Laterality: Left;  . Port-a-cath removal Right 03/15/2015    Procedure: REMOVAL PORT-A-CATH;  Surgeon: Autumn Messing III, MD;  Location: [place];  Service: General;  Laterality: Right;  . Breast biopsy Left 08/06/14  . Re-excision of breast cancer,superior margins Left 04/21/2015    Procedure: RE-EXCISION OF BREAST CANCER, INFERIOR MARGIN;  Surgeon: Autumn Messing III, MD;  [facility];  Service: General;  Laterality: Left;    There were no vitals filed for this visit.       Subjective Assessment - 05/06/15 1024    Subjective "I'm getting there"    Pertinent History Left lumpectomy on 03/15/15 showed G2 invasive ductal carcinoma, 2cm, G2, and DCIS, G2. the lateral margin was positive for invasive tumor, 1 out of 15 nodes was positive. Pt requires another breast surgery in the next couple of weeks to clean margins. She also will begin radiation in approx 8-10 weeks. On 04/21/2015, pt had another surgery to get clear margins. She has not doing much since then,  She goes tomorrow for a CT scan     Patient Stated Goals for LUE to move like RUE   Currently in Pain? No/denies            [facility] PT Assessment - 05/07/15 0001    Observation/Other Assessments   Observations well healed incisions from surgery    Quick DASH  43.18   AROM  Left Shoulder Flexion 138 Degrees   Left Shoulder ABduction 90 Degrees  cues to avoid shoulder hiking    Palpation   Palpation comment No axillary cording palpaed today but pt does have fullness at left medical antecubital fossa.            LYMPHEDEMA/ONCOLOGY QUESTIONNAIRE - 05/06/15 1031    Left Upper Extremity Lymphedema   15 cm Proximal to Olecranon Process 32.5 cm   Olecranon Process 26.4 cm   10 cm Proximal to Ulnar Styloid Process 20.5 cm   Just Proximal to Ulnar Styloid Process 15.4 cm   Across Hand at [employer] 19 cm   At [place] of 2nd Digit 6 cm           Quick Dash - 05/07/15 0001    Open a tight or new jar Mild  difficulty   Do heavy household chores (wash walls, wash floors) Moderate difficulty   Carry a shopping bag or briefcase Moderate difficulty   Wash your back Moderate difficulty   Use a knife to cut food Mild difficulty   Recreational activities in which you take some force or impact through your arm, shoulder, or hand (golf, hammering, tennis) Severe difficulty   During the past week, to what extent has your arm, shoulder or hand problem interfered with your normal social activities with family, friends, neighbors, or groups? Modererately   During the past week, to what extent has your arm, shoulder or hand problem limited your work or other regular daily activities Modererately   Arm, shoulder, or hand pain. Moderate   Tingling (pins and needles) in your arm, shoulder, or hand Moderate   Difficulty Sleeping No difficulty   DASH Score 43.18 %             [facility] Adult PT Treatment/Exercise - 05/07/15 0001    Shoulder Exercises: Supine   External Rotation AROM;Left;10 reps   Internal Rotation AROM;Left;10 reps   Flexion AAROM;10 reps  with dowel   ABduction AAROM;10 reps  with dowel   Shoulder Exercises: Sidelying   ABduction AROM  to 90 degrees, (hand toward ceiling)    Other Sidelying Exercises upper trunk rotation with arm stretched out to side                 PT Education - 05/07/15 1310    Education provided Yes   Education Details continue range of motion and stretching wih arm behind head and pillow under elbow to prepare for radiation simulation    Person(s) Educated Patient   Methods Explanation   Comprehension Verbalized understanding;Returned demonstration           Short Term Clinic Goals - 05/07/15 1320    CC Short Term Goal  #1   Title Pt to demonstrate 120 degrees of left shoulder flexion to allow pt to reach items overhead.   Status On-going   CC Short Term Goal  #2   Title Pt to demonstrate 120 degrees of left shoulder abduction to allow pt to  reach items out to sides   Status On-going   CC Short Term Goal  #3   Title Pt to be able to verbalize lymphedema risk reduction strategies   Status On-going             Long Term Clinic Goals - 05/07/15 1321    CC Long Term Goal  #1   Title Pt to demonstrate 155 degrees of left shoulder flexion to allow pt to reach items  on shelves   Time 8   Period Weeks   Status On-going   CC Long Term Goal  #2   Title Pt to demonstrate 165 degrees of left shoulder abduction to allow pt to return to prior level of function   Time 8   Period Weeks   Status On-going   CC Long Term Goal  #3   Title Pt to demonstrate 85 degrees of left shoulder IR to allow pt to return to prior level of function   Time 8   Period Weeks   Status On-going   CC Long Term Goal  #4   Title Pt to be independent with a home exercise program for strengthening and ROM for long term management    Time 8   Period Weeks   Status On-going   CC Long Term Goal  #5   Title Pt to demonstrate gross LUE of 5/5 to allow pt to return to PLOF   Time 8   Period Weeks   Status On-going            Plan - 05/07/15 1313    Clinical Impression Statement Pt was reevaluated after surgery to ge clear margins.  She has healed well, but continues to have stiffness and decreased strength in left shoulder.  She will need continued PT to assure she will be comfortably able to received radiation therapy and progress from there . 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    Rehab Potential Good   Clinical Impairments Affecting Rehab Potential 2 breast surgeries, radiation soon    PT Frequency 2x / week   PT Duration 8 weeks   PT Treatment/Interventions Manual techniques;Therapeutic exercise;Taping;Passive range of motion;Scar mobilization;ADLs/Self Care Home Management;Manual lymph drainage;Compression  bandaging;Patient/family education;Electrical Stimulation   PT Next Visit Plan PROM/AAROM/AROM to left shoulder, add to HEP as needed, for shoulder , later  begin e stim to feet and add nustep or bike    Consulted and Agree with Plan of Care Patient      Patient will benefit from skilled therapeutic intervention in order to improve the following deficits and impairments:  Decreased range of motion, Pain, Impaired UE functional use, Decreased strength, Decreased activity tolerance, Decreased knowledge of precautions, Decreased knowledge of use of DME, Increased edema  Visit Diagnosis: Pain in left shoulder - Plan: PT plan of care cert/re-cert  Stiffness of left shoulder, not elsewhere classified - Plan: PT plan of care cert/re-cert  Pain in left foot - Plan: PT plan of care cert/re-cert  Pain in right foot - Plan: PT plan of care cert/re-cert  Muscle weakness (generalized) - Plan: PT plan of care cert/re-cert  Unspecified disturbances of skin sensation - Plan: PT plan of care cert/re-cert     Problem List Patient Active Problem List   Diagnosis Date Noted  . Breast cancer, female, left 03/15/2015  . UTI (urinary tract infection) 12/02/2014  . Chemotherapy induced nausea and vomiting 11/03/2014  . Anemia due to antineoplastic chemotherapy 11/03/2014  . Genetic testing 10/13/2014  . Family history of breast cancer   . Family history of pancreatic cancer   . Family history of breast cancer in female   . Breast cancer of upper-inner quadrant of left female breast ([place]) 08/16/2014  . BP (high blood pressure) 04/17/2014  . NSTEMI (non-ST elevated myocardial infarction) ([place]) 03/16/2014  . 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  Chest pain 09/04/2012  . Essential hypertension, benign 09/04/2012  . Angina pectoris with documented spasm ([place]) 03/05/2012  . Coronary artery spasm ([place]) 03/05/2012  . Arthritis of knee, degenerative 12/04/2011   Donato Heinz. Owens Shark, PT   05/07/2015, 1:29  PM  [place], [place], [address] Phone: [phone]   Fax:  [phone]  Name: Mary Randall MRN: [id number] Date of Birth: 12/03/1955

## 2015-05-07 NOTE — Progress Notes (Signed)
Radiation Oncology         ([phone]) [phone] ________________________________  Name: Mary Randall MRN: [id number]  Date: 05/07/2015  DOB: May 24, 1955  SIMULATION AND TREATMENT PLANNING NOTE, Special treatment procedure  outpatient  DIAGNOSIS:     ICD-9-CM ICD-10-CM   1. Breast cancer of upper-inner quadrant of left female breast ([place]) 174.2 C50.212     NARRATIVE:  The patient was brought to the [facility].  Identity was confirmed.  All relevant records and images related to the planned course of therapy were reviewed.  The patient freely provided informed written consent to proceed with treatment after reviewing the details related to the planned course of therapy. The consent form was witnessed and verified by the simulation staff.    Then, the patient was set-up in a stable reproducible supine position for radiation therapy with her ipsilateral arm over her head, and her upper body secured in a custom-made Vac-lok device.  CT images were obtained.  Surface markings were placed.  The CT images were loaded into the planning software.    Special treatment procedure was performed today due to the extra time and effort required by myself to plan and prepare this patient for deep inspiration breath hold technique.  I have determined cardiac sparing to be of benefit to this patient to prevent long term cardiac damage due to radiation of the heart.  Bellows were placed on the patient's abdomen. To facilitate cardiac sparing, the patient was coached by the radiation therapists on breath hold techniques and breathing practice was performed. Practice waveforms were obtained. The patient was then scanned while maintaining breath hold in the treatment position.  This image was then transferred over to the imaging specialist. The imaging specialist then created a fusion of the free breathing and breath hold scans using the chest wall as the stable structure. I personally reviewed the fusion in  axial, coronal and sagittal image planes.  Excellent cardiac sparing was obtained.  I felt the patient is an appropriate candidate for breath hold and the patient will be treated as such.  The image fusion was then reviewed with the patient to reinforce the necessity of reproducible breath hold.  TREATMENT PLANNING NOTE: Treatment planning then occurred.  The radiation prescription was entered and confirmed.     A total of 5 medically necessary complex treatment devices were fabricated and supervised by me: 4 fields with MLCs for custom blocks to protect heart, and lungs;  and, a Vac-lok. MORE COMPLEX DEVICES MAY BE MADE IN DOSIMETRY FOR FIELD IN FIELD BEAMS FOR DOSE HOMOGENEITY.  I have requested : 3D Simulation  I have requested a DVH of the following structures: lungs, heart, lumpectomy cavity.    The patient will receive 50 Gy in 25 fractions to the left breast with 2 tangential fields 2 additional fields will treat supraclavicular and axillary nodes to 45 Gy in 25 fractions.   This will be followed by a lumpectomy boost.  Optical Surface Tracking Plan:  Since intensity modulated radiotherapy (IMRT) and 3D conformal radiation treatment methods are predicated on accurate and precise positioning for treatment, intrafraction motion monitoring is medically necessary to ensure accurate and safe treatment delivery. The ability to quantify intrafraction motion without excessive ionizing radiation dose can only be performed with optical surface tracking. Accordingly, surface imaging offers the opportunity to obtain 3D measurements of patient position throughout IMRT and 3D treatments without excessive radiation exposure. I am ordering optical surface tracking for this patient's upcoming course  of radiotherapy.  ________________________________   Reference:  Particia Jasper, et al. Surface imaging-based analysis of intrafraction motion for breast radiotherapy patients.Journal of [place], n. 6, nov. 2014. ISSN [id number].  Available at: <http://www.jacmp.org/index.php/jacmp/article/view/4957>.    -----------------------------------  Eppie Gibson, MD

## 2015-05-10 ENCOUNTER — Ambulatory Visit: Payer: Federal, State, Local not specified - PPO

## 2015-05-10 DIAGNOSIS — M79671 Pain in right foot: Secondary | ICD-10-CM | POA: Diagnosis not present

## 2015-05-10 DIAGNOSIS — C50212 Malignant neoplasm of upper-inner quadrant of left female breast: Secondary | ICD-10-CM | POA: Diagnosis not present

## 2015-05-10 DIAGNOSIS — Z170 Estrogen receptor positive status [ER+]: Secondary | ICD-10-CM | POA: Diagnosis not present

## 2015-05-10 DIAGNOSIS — M25512 Pain in left shoulder: Secondary | ICD-10-CM

## 2015-05-10 DIAGNOSIS — Z510 Encounter for antineoplastic radiation therapy: Secondary | ICD-10-CM | POA: Diagnosis not present

## 2015-05-10 DIAGNOSIS — M6281 Muscle weakness (generalized): Secondary | ICD-10-CM | POA: Diagnosis not present

## 2015-05-10 DIAGNOSIS — M25612 Stiffness of left shoulder, not elsewhere classified: Secondary | ICD-10-CM

## 2015-05-10 DIAGNOSIS — M79672 Pain in left foot: Secondary | ICD-10-CM | POA: Diagnosis not present

## 2015-05-10 DIAGNOSIS — R209 Unspecified disturbances of skin sensation: Secondary | ICD-10-CM | POA: Diagnosis not present

## 2015-05-10 NOTE — Therapy (Signed)
[place], [place], [address] Phone: [phone]   Fax:  [phone]  Physical Therapy Treatment  Patient Details  Name: Mary Randall MRN: [id number] Date of Birth: Jan 02, 1956 Referring Provider: Dr. Isidore Moos  Encounter Date: 05/10/2015      PT End of Session - 05/10/15 0932    Visit Number 6   Number of Visits 17   Date for PT Re-Evaluation 07/02/15   PT Start Time 0853   PT Stop Time 0931   PT Time Calculation (min) 38 min   Activity Tolerance Patient tolerated treatment well   Behavior During Therapy [facility] for tasks assessed/performed      Past Medical History  Diagnosis Date  . Hypertension   . 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 Family history of breast cancer   . Family history of pancreatic cancer   . Family history of breast cancer in female   . Angina effort ([place])     none recent  . NSTEMI (non-ST elevated myocardial infarction) ([place]) 03/16/2014    non-obs CAD, spasm seen  . Pneumonia 2009  . Arthritis     "right knee" (03/16/2015)  . Breast cancer, left ([place]) dx'd 2016    Past Surgical History  Procedure Laterality Date  . Ectopic pregnancy surgery  1991    Tubal pregnancy  . Cardiac catheterization  2010; 03/16/2014  . Tubal ligation  1981  . Left heart catheterization with coronary angiogram N/A 03/16/2014    Procedure: LEFT HEART CATHETERIZATION WITH CORONARY ANGIOGRAM;  Surgeon: Sherren Mocha, MD; Non-obs CAD, EF 55%  . Bunionectomy Bilateral   . Portacath placement N/A 08/28/2014    Procedure: INSERTION PORT-A-CATH;  Surgeon: Autumn Messing III, MD;  Location: [facility];  Service: General;  Laterality: N/A;  . Breast lumpectomy with needle localization and axillary lymph node dissection Left 03/15/2015    Procedure: LEFT BREAST LUMPECTOMY WITH BRACKETED NEEDLE LOCALIZATION AND AXILLARY LYMPH NODE DISSECTION;  Surgeon: Autumn Messing III, MD;  Location: [facility] [place];   Service: General;  Laterality: Left;  . Port-a-cath removal Right 03/15/2015    Procedure: REMOVAL PORT-A-CATH;  Surgeon: Autumn Messing III, MD;  Location: [facility];  Service: General;  Laterality: Right;  . Breast biopsy Left 08/06/14  . Re-excision of breast cancer,superior margins Left 04/21/2015    Procedure: RE-EXCISION OF BREAST CANCER, INFERIOR MARGIN;  Surgeon: Autumn Messing III, MD;  [facility];  Service: General;  Laterality: Left;    There were no vitals filed for this visit.      Subjective Assessment - 05/10/15 0856    Subjective I'm doing okay. Not doing any HEP stretches but I am using my arm regularly with my ADLs.   Pertinent History Left lumpectomy on 03/15/15 showed G2 invasive ductal carcinoma, 2cm, G2, and DCIS, G2. the lateral margin was positive for invasive tumor, 1 out of 15 nodes was positive. Pt requires another breast surgery in the next couple of weeks to clean margins. She also will begin radiation in approx 8-10 weeks. On 04/21/2015, pt had another surgery to get clear margins. She has not doing much since then,  She goes tomorrow for a CT scan     Patient Stated Goals for LUE to move like RUE   Currently in Pain? No/denies                         [facility] Adult PT Treatment/Exercise -  05/10/15 0001    Shoulder Exercises: Pulleys   Flexion 2 minutes   ABduction 2 minutes   Shoulder Exercises: Therapy Ball   Flexion 10 reps   ABduction 10 reps   Manual Therapy   Passive ROM In Supine with HOB elevated: Lt shoulder in to flexion, abduction D2, and ER all to pts tolerance.                   Short Term Clinic Goals - 05/07/15 1320    CC Short Term Goal  #1   Title Pt to demonstrate 120 degrees of left shoulder flexion to allow pt to reach items overhead.   Status On-going   CC Short Term Goal  #2   Title Pt to demonstrate 120 degrees of left shoulder abduction to allow pt to reach items out to sides   Status On-going   CC Short Term Goal   #3   Title Pt to be able to verbalize lymphedema risk reduction strategies   Status On-going             Long Term Clinic Goals - 05/07/15 1321    CC Long Term Goal  #1   Title Pt to demonstrate 155 degrees of left shoulder flexion to allow pt to reach items on shelves   Time 8   Period Weeks   Status On-going   CC Long Term Goal  #2   Title Pt to demonstrate 165 degrees of left shoulder abduction to allow pt to return to prior level of function   Time 8   Period Weeks   Status On-going   CC Long Term Goal  #3   Title Pt to demonstrate 85 degrees of left shoulder IR to allow pt to return to prior level of function   Time 8   Period Weeks   Status On-going   CC Long Term Goal  #4   Title Pt to be independent with a home exercise program for strengthening and ROM for long term management    Time 8   Period Weeks   Status On-going   CC Long Term Goal  #5   Title Pt to demonstrate gross LUE of 5/5 to allow pt to return to PLOF   Time 8   Period Weeks   Status On-going            Plan - 05/10/15 0932    Clinical Impression Statement Resumed AAROM and PROM of Lt shoulder and pt tolerated this well without any increased pain. Though she admits to not doing any HEP stretching, she is using her arm nomally and was able to get into position for her radiaton simulation the other day without much trouble.    Rehab Potential Good   Clinical Impairments Affecting Rehab Potential 2 breast surgeries, radiation soon    PT Frequency 2x / week   PT Duration 8 weeks   PT Treatment/Interventions Manual techniques;Therapeutic exercise;Taping;Passive range of motion;Scar mobilization;ADLs/Self Care Home Management;Manual lymph drainage;Compression bandaging;Patient/family education;Electrical Stimulation   PT Next Visit Plan Cont PROM/AAROM/AROM to left shoulder, add to HEP as needed and encourage pt to be compliant with this, begin e stim to feet and add nustep or bike    Consulted and  Agree with Plan of Care Patient      Patient will benefit from skilled therapeutic intervention in order to improve the following deficits and impairments:  Decreased range of motion, Pain, Impaired UE functional use, Decreased strength, Decreased activity tolerance,  Decreased knowledge of precautions, Decreased knowledge of use of DME, Increased edema  Visit Diagnosis: Pain in left shoulder  Stiffness of left shoulder, not elsewhere classified     Problem List Patient Active Problem List   Diagnosis Date Noted  . Breast cancer, female, left 03/15/2015  . UTI (urinary tract infection) 12/02/2014  . Chemotherapy induced nausea and vomiting 11/03/2014  . Anemia due to antineoplastic chemotherapy 11/03/2014  . Genetic testing 10/13/2014  . Family history of breast cancer   . Family history of pancreatic cancer   . Family history of breast cancer in female   . Breast cancer of upper-inner quadrant of left female breast ([place]) 08/16/2014  . BP (high blood pressure) 04/17/2014  . NSTEMI (non-ST elevated myocardial infarction) ([place]) 03/16/2014  . 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 Chest pain 09/04/2012  . Essential hypertension, benign 09/04/2012  . Angina pectoris with documented spasm ([place]) 03/05/2012  . Coronary artery spasm ([place]) 03/05/2012  . Arthritis of knee, degenerative 12/04/2011    Otelia Limes, PTA 05/10/2015, 9:38 AM  [place], [place], [address] Phone: [phone]   Fax:  [phone]  Name: HERTA LEUENBERGER MRN: [id number] Date of Birth: Jan 13, 1955

## 2015-05-12 ENCOUNTER — Ambulatory Visit: Payer: Federal, State, Local not specified - PPO

## 2015-05-12 ENCOUNTER — Other Ambulatory Visit: Payer: Self-pay | Admitting: *Deleted

## 2015-05-12 DIAGNOSIS — M79671 Pain in right foot: Secondary | ICD-10-CM | POA: Diagnosis not present

## 2015-05-12 DIAGNOSIS — R209 Unspecified disturbances of skin sensation: Secondary | ICD-10-CM

## 2015-05-12 DIAGNOSIS — Z510 Encounter for antineoplastic radiation therapy: Secondary | ICD-10-CM | POA: Diagnosis not present

## 2015-05-12 DIAGNOSIS — M79672 Pain in left foot: Secondary | ICD-10-CM

## 2015-05-12 DIAGNOSIS — M25612 Stiffness of left shoulder, not elsewhere classified: Secondary | ICD-10-CM | POA: Diagnosis not present

## 2015-05-12 DIAGNOSIS — M6281 Muscle weakness (generalized): Secondary | ICD-10-CM | POA: Diagnosis not present

## 2015-05-12 DIAGNOSIS — M25512 Pain in left shoulder: Secondary | ICD-10-CM

## 2015-05-12 DIAGNOSIS — C50212 Malignant neoplasm of upper-inner quadrant of left female breast: Secondary | ICD-10-CM | POA: Diagnosis not present

## 2015-05-12 DIAGNOSIS — Z170 Estrogen receptor positive status [ER+]: Secondary | ICD-10-CM | POA: Diagnosis not present

## 2015-05-12 MED ORDER — ISOSORBIDE MONONITRATE ER 30 MG PO TB24: 30.0000 mg | ORAL_TABLET | Freq: Every day | ORAL | Status: DC

## 2015-05-12 NOTE — Patient Instructions (Signed)
Over Head Pull: Narrow Grip     Cancer Rehab [phone]   On back, knees bent, feet flat, band across thighs, elbows straight but relaxed. Pull hands apart (start). Keeping elbows straight, bring arms up and over head, hands toward floor. Keep pull steady on band. Hold momentarily. Return slowly, keeping pull steady, back to start. Repeat _10__ times. Band color _yellow_____   Side Pull: Double Arm   On back, knees bent, feet flat. Arms perpendicular to body, shoulder level, elbows straight but relaxed. Pull arms out to sides, elbows straight. Resistance band comes across collarbones, hands toward floor. Hold momentarily. Slowly return to starting position. Repeat _10__ times. Band color _yellow____   Sash   On back, knees bent, feet flat, left hand on left hip, right hand above left. Pull right arm DIAGONALLY (hip to shoulder) across chest. Bring right arm along head toward floor. Hold momentarily. Slowly return to starting position. Repeat _10__ times. Do with left arm. Band color _yellow_____   Shoulder Rotation: Double Arm   On back, knees bent, feet flat, elbows tucked at sides, bent 90, hands palms up. Pull hands apart and down toward floor, keeping elbows near sides. Hold momentarily. Slowly return to starting position. Repeat _10__ times. Band color __yellow____

## 2015-05-12 NOTE — Therapy (Signed)
[place], [place], [address] Phone: [phone]   Fax:  [phone]  Physical Therapy Treatment  Patient Details  Name: Mary Randall MRN: [id number] Date of Birth: Oct 02, 1955 Referring Provider: Dr. Isidore Moos  Encounter Date: 05/12/2015      PT End of Session - 05/12/15 1008    Visit Number 7   Number of Visits 17   Date for PT Re-Evaluation 07/02/15   PT Start Time 0937   PT Stop Time 1021   PT Time Calculation (min) 44 min   Activity Tolerance Patient tolerated treatment well   Behavior During Therapy [facility] for tasks assessed/performed      Past Medical History  Diagnosis Date  . Hypertension   . 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 Family history of breast cancer   . Family history of pancreatic cancer   . Family history of breast cancer in female   . Angina effort ([place])     none recent  . NSTEMI (non-ST elevated myocardial infarction) ([place]) 03/16/2014    non-obs CAD, spasm seen  . Pneumonia 2009  . Arthritis     "right knee" (03/16/2015)  . Breast cancer, left ([place]) dx'd 2016    Past Surgical History  Procedure Laterality Date  . Ectopic pregnancy surgery  1991    Tubal pregnancy  . Cardiac catheterization  2010; 03/16/2014  . Tubal ligation  1981  . Left heart catheterization with coronary angiogram N/A 03/16/2014    Procedure: LEFT HEART CATHETERIZATION WITH CORONARY ANGIOGRAM;  Surgeon: Sherren Mocha, MD; Non-obs CAD, EF 55%  . Bunionectomy Bilateral   . Portacath placement N/A 08/28/2014    Procedure: INSERTION PORT-A-CATH;  Surgeon: Autumn Messing III, MD;  Location: [place];  Service: General;  Laterality: N/A;  . Breast lumpectomy with needle localization and axillary lymph node dissection Left 03/15/2015    Procedure: LEFT BREAST LUMPECTOMY WITH BRACKETED NEEDLE LOCALIZATION AND AXILLARY LYMPH NODE DISSECTION;  Surgeon: Autumn Messing III, MD;  Location: [place];   Service: General;  Laterality: Left;  . Port-a-cath removal Right 03/15/2015    Procedure: REMOVAL PORT-A-CATH;  Surgeon: Autumn Messing III, MD;  Location: [place];  Service: General;  Laterality: Right;  . Breast biopsy Left 08/06/14  . Re-excision of breast cancer,superior margins Left 04/21/2015    Procedure: RE-EXCISION OF BREAST CANCER, INFERIOR MARGIN;  Surgeon: Autumn Messing III, MD;  [facility];  Service: General;  Laterality: Left;    There were no vitals filed for this visit.      Subjective Assessment - 05/12/15 0939    Subjective We forgot to do the electrical stimulation on my feet last time! I want to do that today, they are always tingling.   Pertinent History Left lumpectomy on 03/15/15 showed G2 invasive ductal carcinoma, 2cm, G2, and DCIS, G2. the lateral margin was positive for invasive tumor, 1 out of 15 nodes was positive. Pt requires another breast surgery in the next couple of weeks to clean margins. She also will begin radiation in approx 8-10 weeks. On 04/21/2015, pt had another surgery to get clear margins. She has not doing much since then,  She goes tomorrow for a CT scan     Patient Stated Goals for LUE to move like RUE   Currently in Pain? No/denies            [facility] PT Assessment - 05/12/15 0001    AROM   Left  Shoulder Flexion 140 Degrees   Left Shoulder ABduction 123 Degrees                     [facility] Adult PT Treatment/Exercise - 05/12/15 0001    Shoulder Exercises: Supine   Horizontal ABduction Strengthening;Both;10 reps;Theraband   Theraband Level (Shoulder Horizontal ABduction) Level 1 (Yellow)   External Rotation Strengthening;Both;10 reps;Theraband   Theraband Level (Shoulder External Rotation) Level 1 (Yellow)   Flexion Strengthening;Both;5 reps;Theraband  Narrow and wide grip 5 each   Theraband Level (Shoulder Flexion) Level 1 (Yellow)   Other Supine Exercises Bil D2 with yellow theraband x5 each side, pt returning good demo after tactile  cues for correct technique   Shoulder Exercises: Pulleys   Flexion 2 minutes   ABduction 2 minutes   Shoulder Exercises: Therapy Ball   Flexion 10 reps   ABduction 10 reps   Electrical Stimulation   Electrical Stimulation Location Dorsal feet and Rt lateral ankle, Lt medial ankle   Electrical Stimulation Action Interfernetial    Electrical Stimulation Parameters 80-150 Hz (output Rt 22 and Lt 24)   Electrical Stimulation Goals Pain   Manual Therapy   Passive ROM In Supine with HOB elevated: Lt shoulder in to flexion, abduction D2, and ER all to pts tolerance.                   Short Term Clinic Goals - 05/12/15 [id number]    CC Short Term Goal  #1   Title Pt to demonstrate 120 degrees of left shoulder flexion to allow pt to reach items overhead.   Baseline 89 degrees, 140 degrees 05/12/15   Status Achieved   CC Short Term Goal  #2   Title Pt to demonstrate 120 degrees of left shoulder abduction to allow pt to reach items out to sides   Baseline 84, 123 degrees 05/12/15   Status Achieved   CC Short Term Goal  #3   Title Pt to be able to verbalize lymphedema risk reduction strategies   Status Achieved             Long Term Clinic Goals - 05/12/15 0955    CC Long Term Goal  #1   Title Pt to demonstrate 155 degrees of left shoulder flexion to allow pt to reach items on shelves   Baseline 89, 121 degrees 04/13/15, 140 degrees 05/12/15   Status On-going   CC Long Term Goal  #2   Title Pt to demonstrate 165 degrees of left shoulder abduction to allow pt to return to prior level of function   Baseline 84, 96 degrees 04/13/15, 123 degrees 05/12/15   Status On-going   CC Long Term Goal  #3   Title Pt to demonstrate 85 degrees of left shoulder IR to allow pt to return to prior level of function   Status On-going   CC Long Term Goal  #4   Title Pt to be independent with a home exercise program for strengthening and ROM for long term management   Progressed to supine scapular  stregnth today 05/12/15   Status On-going   CC Long Term Goal  #5   Title Pt to demonstrate gross LUE of 5/5 to allow pt to return to PLOF   Status On-going            Plan - 05/12/15 0957    Clinical Impression Statement Pt continued to tolerate therapy well today including new HEP exercises issued. Also had first trial  of eletrical stimulation to feet today and pt reported "could feel it helping" and has a TENS unit at home but can't find charger. Pt has made progressed with her A/ROM meeting STG's.    Rehab Potential Good   Clinical Impairments Affecting Rehab Potential 2 breast surgeries, radiation soon    PT Frequency 2x / week   PT Duration 8 weeks   PT Treatment/Interventions Manual techniques;Therapeutic exercise;Taping;Passive range of motion;Scar mobilization;ADLs/Self Care Home Management;Manual lymph drainage;Compression bandaging;Patient/family education;Electrical Stimulation   PT Next Visit Plan Assess LUE strength for goal assess. Cont PROM/AAROM/AROM to left shoulder, review and add to HEP as needed and encourage pt to be compliant with this, assess if  e stim to feet was helpful  and add nustep or bike    PT Home Exercise Plan Supine scapular series with yellow theraband   Consulted and Agree with Plan of Care Patient      Patient will benefit from skilled therapeutic intervention in order to improve the following deficits and impairments:  Decreased range of motion, Pain, Impaired UE functional use, Decreased strength, Decreased activity tolerance, Decreased knowledge of precautions, Decreased knowledge of use of DME, Increased edema  Visit Diagnosis: Pain in left shoulder  Stiffness of left shoulder, not elsewhere classified  Pain in left foot  Pain in right foot  Muscle weakness (generalized)  Unspecified disturbances of skin sensation     Problem List Patient Active Problem List   Diagnosis Date Noted  . Breast cancer, female, left 03/15/2015  . UTI  (urinary tract infection) 12/02/2014  . Chemotherapy induced nausea and vomiting 11/03/2014  . Anemia due to antineoplastic chemotherapy 11/03/2014  . Genetic testing 10/13/2014  . Family history of breast cancer   . Family history of pancreatic cancer   . Family history of breast cancer in female   . Breast cancer of upper-inner quadrant of left female breast ([place]) 08/16/2014  . BP (high blood pressure) 04/17/2014  . NSTEMI (non-ST elevated myocardial infarction) ([place]) 03/16/2014  . 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 Chest pain 09/04/2012  . Essential hypertension, benign 09/04/2012  . Angina pectoris with documented spasm ([place]) 03/05/2012  . Coronary artery spasm ([place]) 03/05/2012  . Arthritis of knee, degenerative 12/04/2011    Otelia Limes, PTA 05/12/2015, 10:21 AM  [place], [place], [address] Phone: [phone]   Fax:  [phone]  Name: Mary Randall MRN: [id number] Date of Birth: October 09, 1955

## 2015-05-14 ENCOUNTER — Ambulatory Visit
Admission: RE | Admit: 2015-05-14 | Discharge: 2015-05-14 | Disposition: A | Discharge status: Home / Self Care | Payer: Federal, State, Local not specified - PPO | Source: Ambulatory Visit | Attending: Radiation Oncology | Admitting: Radiation Oncology

## 2015-05-14 DIAGNOSIS — C50212 Malignant neoplasm of upper-inner quadrant of left female breast: Secondary | ICD-10-CM | POA: Insufficient documentation

## 2015-05-14 DIAGNOSIS — Z170 Estrogen receptor positive status [ER+]: Secondary | ICD-10-CM | POA: Diagnosis not present

## 2015-05-14 DIAGNOSIS — Z510 Encounter for antineoplastic radiation therapy: Secondary | ICD-10-CM | POA: Diagnosis not present

## 2015-05-14 MED ORDER — RADIAPLEXRX EX GEL
Freq: Once | CUTANEOUS | Status: AC
  Administered 2015-05-14: 14:00:00 via TOPICAL

## 2015-05-14 MED ORDER — ALRA NON-METALLIC DEODORANT (RAD-ONC)
1.0000 "application " | Freq: Once | TOPICAL | Status: AC
  Administered 2015-05-14: 1 via TOPICAL

## 2015-05-14 NOTE — Progress Notes (Signed)
Pt here for patient teaching.  Pt given Radiation and You booklet, skin care instructions, Alra deodorant and Radiaplex gel. Pt reports they have not watched the Radiation Therapy Education video, but were given the link to watch at home.  Reviewed areas of pertinence such as fatigue, skin changes, breast tenderness, breast swelling, cough, shortness of breath, earaches and taste changes . Pt able to give teach back of to pat skin, use unscented/gentle soap and drink plenty of water,apply Radiaplex bid, avoid applying anything to skin within 4 hours of treatment and avoid wearing an under wire bra. Pt verbalizes understanding of information given and will contact nursing with any questions or concerns.     Http://[email or web address]/treatmentinformation/whattoexpect/index

## 2015-05-17 ENCOUNTER — Ambulatory Visit
Admission: RE | Admit: 2015-05-17 | Discharge: 2015-05-17 | Disposition: A | Discharge status: Home / Self Care | Payer: Federal, State, Local not specified - PPO | Source: Ambulatory Visit | Attending: Radiation Oncology | Admitting: Radiation Oncology

## 2015-05-17 ENCOUNTER — Encounter: Payer: Self-pay | Admitting: Radiation Oncology

## 2015-05-17 VITALS — BP 118/77 | HR 83 | Temp 98.1°F | Ht 67.0 in | Wt 180.0 lb

## 2015-05-17 DIAGNOSIS — C50212 Malignant neoplasm of upper-inner quadrant of left female breast: Secondary | ICD-10-CM | POA: Diagnosis not present

## 2015-05-17 DIAGNOSIS — Z510 Encounter for antineoplastic radiation therapy: Secondary | ICD-10-CM | POA: Diagnosis not present

## 2015-05-17 DIAGNOSIS — Z170 Estrogen receptor positive status [ER+]: Secondary | ICD-10-CM | POA: Diagnosis not present

## 2015-05-17 NOTE — Progress Notes (Signed)
   Weekly Management Note:  outpatient    ICD-9-CM ICD-10-CM   1. Breast cancer of upper-inner quadrant of left female breast (HCC) 174.2 C50.212     Current Dose:  2 Gy  Projected Dose: 60 Gy   Narrative:  The patient presents for routine under treatment assessment.  CBCT/MVCT images/Port film x-rays were reviewed.  The chart was checked. Doing well  Physical Findings:  height is 5\' 7"  (1.702 m) and weight is 180 lb (81.647 kg). Her temperature is 98.1 F (36.7 C). Her blood pressure is 118/77 and her pulse is 83.   Wt Readings from Last 3 Encounters:  05/17/15 180 lb (81.647 kg)  04/21/15 176 lb 4 oz (79.946 kg)  04/17/15 176 lb 4 oz (79.946 kg)   Well appearing, skin exam deferred  Impression:  The patient is tolerating radiotherapy.  Plan:  Continue radiotherapy as planned.    ________________________________   Eppie Gibson, M.D.

## 2015-05-17 NOTE — Progress Notes (Signed)
Mary Randall is here for her first fraction of radiation to her Left Breast. She denies pain at this time. She is doing well, and denies any concerns at this time. She is using the Radiaplex creams as directed twice daily.  BP 118/77 mmHg  Pulse 83  Temp(Src) 98.1 F (36.7 C)  Ht 5\' 7"  (1.702 m)  Wt 180 lb (81.647 kg)  BMI 28.19 kg/m2

## 2015-05-18 ENCOUNTER — Ambulatory Visit
Admission: RE | Admit: 2015-05-18 | Discharge: 2015-05-18 | Disposition: A | Discharge status: Home / Self Care | Payer: Federal, State, Local not specified - PPO | Source: Ambulatory Visit | Attending: Radiation Oncology | Admitting: Radiation Oncology

## 2015-05-18 ENCOUNTER — Ambulatory Visit: Payer: Federal, State, Local not specified - PPO

## 2015-05-18 DIAGNOSIS — Z510 Encounter for antineoplastic radiation therapy: Secondary | ICD-10-CM | POA: Diagnosis not present

## 2015-05-18 DIAGNOSIS — M25612 Stiffness of left shoulder, not elsewhere classified: Secondary | ICD-10-CM | POA: Diagnosis not present

## 2015-05-18 DIAGNOSIS — C50212 Malignant neoplasm of upper-inner quadrant of left female breast: Secondary | ICD-10-CM | POA: Diagnosis not present

## 2015-05-18 DIAGNOSIS — M25512 Pain in left shoulder: Secondary | ICD-10-CM

## 2015-05-18 DIAGNOSIS — M79672 Pain in left foot: Secondary | ICD-10-CM | POA: Diagnosis not present

## 2015-05-18 DIAGNOSIS — M79671 Pain in right foot: Secondary | ICD-10-CM

## 2015-05-18 DIAGNOSIS — R209 Unspecified disturbances of skin sensation: Secondary | ICD-10-CM | POA: Diagnosis not present

## 2015-05-18 DIAGNOSIS — Z170 Estrogen receptor positive status [ER+]: Secondary | ICD-10-CM | POA: Diagnosis not present

## 2015-05-18 DIAGNOSIS — M6281 Muscle weakness (generalized): Secondary | ICD-10-CM | POA: Diagnosis not present

## 2015-05-18 NOTE — Addendum Note (Signed)
Addended by: Alexia Freestone on: 05/18/2015 03:14 PM   Modules accepted: Orders

## 2015-05-18 NOTE — Therapy (Signed)
[place], [place], [address] Phone: [phone]   Fax:  [phone]  Physical Therapy Treatment  Patient Details  Name: Mary Randall MRN: [id number] Date of Birth: December 10, 1955 Referring Provider: Dr. Isidore Moos  Encounter Date: 05/18/2015      PT End of Session - 05/18/15 1048    Visit Number 8   Number of Visits 17   Date for PT Re-Evaluation 07/02/15   PT Start Time 1029  Pt arrived late   PT Stop Time 1113   PT Time Calculation (min) 44 min   Activity Tolerance Patient tolerated treatment well   Behavior During Therapy [facility] for tasks assessed/performed      Past Medical History  Diagnosis Date  . Hypertension   . 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 Family history of breast cancer   . Family history of pancreatic cancer   . Family history of breast cancer in female   . Angina effort ([place])     none recent  . NSTEMI (non-ST elevated myocardial infarction) ([place]) 03/16/2014    non-obs CAD, spasm seen  . Pneumonia 2009  . Arthritis     "right knee" (03/16/2015)  . Breast cancer, left ([place]) dx'd 2016    Past Surgical History  Procedure Laterality Date  . Ectopic pregnancy surgery  1991    Tubal pregnancy  . Cardiac catheterization  2010; 03/16/2014  . Tubal ligation  1981  . Left heart catheterization with coronary angiogram N/A 03/16/2014    Procedure: LEFT HEART CATHETERIZATION WITH CORONARY ANGIOGRAM;  Surgeon: Sherren Mocha, MD; Non-obs CAD, EF 55%  . Bunionectomy Bilateral   . Portacath placement N/A 08/28/2014    Procedure: INSERTION PORT-A-CATH;  Surgeon: Autumn Messing III, MD;  Location: [place];  Service: General;  Laterality: N/A;  . Breast lumpectomy with needle localization and axillary lymph node dissection Left 03/15/2015    Procedure: LEFT BREAST LUMPECTOMY WITH BRACKETED NEEDLE LOCALIZATION AND AXILLARY LYMPH NODE DISSECTION;  Surgeon: Autumn Messing III, MD;   Location: [place];  Service: General;  Laterality: Left;  . Port-a-cath removal Right 03/15/2015    Procedure: REMOVAL PORT-A-CATH;  Surgeon: Autumn Messing III, MD;  Location: [place];  Service: General;  Laterality: Right;  . Breast biopsy Left 08/06/14  . Re-excision of breast cancer,superior margins Left 04/21/2015    Procedure: RE-EXCISION OF BREAST CANCER, INFERIOR MARGIN;  Surgeon: Autumn Messing III, MD;  [facility];  Service: General;  Laterality: Left;    There were no vitals filed for this visit.      Subjective Assessment - 05/18/15 1030    Subjective My CIPN symptoms were a little better, maybe 30% improved, for about 2 hours after the session. I have an old TENS unit but I can't find the charger, so I'm going to call my insurance and see if they will just pay for a new one. My Lt shoulder is definitely getting better along with my movement.    Pertinent History Left lumpectomy on 03/15/15 showed G2 invasive ductal carcinoma, 2cm, G2, and DCIS, G2. the lateral margin was positive for invasive tumor, 1 out of 15 nodes was positive. Pt requires another breast surgery in the next couple of weeks to clean margins. She also will begin radiation in approx 8-10 weeks. On 04/21/2015, pt had another surgery to get clear margins. She has not doing much since then,  She goes tomorrow for a CT scan  Patient Stated Goals for LUE to move like RUE   Currently in Pain? No/denies            [facility] PT Assessment - 05/18/15 0001    AROM   Left Shoulder Flexion 152 Degrees   Left Shoulder ABduction 139 Degrees                     [facility] Adult PT Treatment/Exercise - 05/18/15 0001    Shoulder Exercises: Supine   Horizontal ABduction Strengthening;Both;10 reps;Theraband   Theraband Level (Shoulder Horizontal ABduction) Level 2 (Red)   External Rotation Strengthening;Both;10 reps;Theraband   Theraband Level (Shoulder External Rotation) Level 2 (Red)   Flexion Strengthening;Both;5  reps;Theraband  Narrow and wide grip, x 5 each   Theraband Level (Shoulder Flexion) Level 2 (Red)   Other Supine Exercises Bil D2 with red theraband x5 each side, pt returning good demo after tactile cues for correct technique   Shoulder Exercises: Pulleys   Flexion 2 minutes   ABduction 2 minutes  VC not to lean over to side   Shoulder Exercises: Therapy Ball   Flexion 10 reps   ABduction 10 reps   Shoulder Exercises: Stretch   Corner Stretch 3 reps;10 seconds   Electrical Stimulation   Electrical Stimulation Location Medial/lateral lower leg is where pt was able to feel it today but she had lotion on today that could've affected this.   Architect Stimulation Parameters 80-150 Hz x 15 minutes (output 27 on both)   Electrical Stimulation Goals Pain   Manual Therapy   Passive ROM In Supine with HOB elevated: Lt shoulder in to flexion, abduction D2, and ER all to pts tolerance.                   Short Term Clinic Goals - 05/12/15 [id number]    CC Short Term Goal  #1   Title Pt to demonstrate 120 degrees of left shoulder flexion to allow pt to reach items overhead.   Baseline 89 degrees, 140 degrees 05/12/15   Status Achieved   CC Short Term Goal  #2   Title Pt to demonstrate 120 degrees of left shoulder abduction to allow pt to reach items out to sides   Baseline 84, 123 degrees 05/12/15   Status Achieved   CC Short Term Goal  #3   Title Pt to be able to verbalize lymphedema risk reduction strategies   Status Achieved             Long Term Clinic Goals - 05/18/15 1046    CC Long Term Goal  #1   Title Pt to demonstrate 155 degrees of left shoulder flexion to allow pt to reach items on shelves   Baseline 89, 121 degrees 04/13/15, 140 degrees 05/12/15, 152 degrees 05/18/15   Status On-going   CC Long Term Goal  #2   Title Pt to demonstrate 165 degrees of left shoulder abduction to allow pt to return to prior level of function    Baseline 84, 96 degrees 04/13/15, 123 degrees 05/12/15, 139 degrees 05/18/15   Status On-going   CC Long Term Goal  #3   Title Pt to demonstrate 85 degrees of left shoulder IR to allow pt to return to prior level of function   Baseline 66,    CC Long Term Goal  #4   Title Pt to be independent with a home exercise program for strengthening and ROM for long term  management    Status On-going   CC Long Term Goal  #5   Title Pt to demonstrate gross LUE of 5/5 to allow pt to return to PLOF   Status On-going            Plan - 05/18/15 1048    Clinical Impression Statement Pts A/ROM measurements improved today and she reports notcing improvement at home as well with her ADLs. She had some noted relief last session from eletrical stimulation so continued this today and pt is going to call her insurance company to see if she can get another one.    Rehab Potential Good   Clinical Impairments Affecting Rehab Potential 2 breast surgeries, radiation soon    PT Frequency 2x / week   PT Duration 8 weeks   PT Treatment/Interventions Manual techniques;Therapeutic exercise;Taping;Passive range of motion;Scar mobilization;ADLs/Self Care Home Management;Manual lymph drainage;Compression bandaging;Patient/family education;Electrical Stimulation   PT Next Visit Plan Assess LUE strength for goal assess. Cont PROM/AAROM/AROM to left shoulder, review and add to HEP as needed and encourage pt to be compliant with this, assess if  e stim to feet was helpful  and add nustep or bike    Consulted and Agree with Plan of Care Patient      Patient will benefit from skilled therapeutic intervention in order to improve the following deficits and impairments:  Decreased range of motion, Pain, Impaired UE functional use, Decreased strength, Decreased activity tolerance, Decreased knowledge of precautions, Decreased knowledge of use of DME, Increased edema  Visit Diagnosis: Pain in left shoulder  Stiffness of left  shoulder, not elsewhere classified  Pain in left foot  Muscle weakness (generalized)  Pain in right foot     Problem List Patient Active Problem List   Diagnosis Date Noted  . Breast cancer, female, left 03/15/2015  . UTI (urinary tract infection) 12/02/2014  . Chemotherapy induced nausea and vomiting 11/03/2014  . Anemia due to antineoplastic chemotherapy 11/03/2014  . Genetic testing 10/13/2014  . Family history of breast cancer   . Family history of pancreatic cancer   . Family history of breast cancer in female   . Breast cancer of upper-inner quadrant of left female breast ([place]) 08/16/2014  . BP (high blood pressure) 04/17/2014  . NSTEMI (non-ST elevated myocardial infarction) ([place]) 03/16/2014  . 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 Chest pain 09/04/2012  . Essential hypertension, benign 09/04/2012  . Angina pectoris with documented spasm ([place]) 03/05/2012  . Coronary artery spasm ([place]) 03/05/2012  . Arthritis of knee, degenerative 12/04/2011    Otelia Limes, PTA 05/18/2015, 12:15 PM  [place], [place], [address] Phone: [phone]   Fax:  [phone]  Name: Mary Randall MRN: [id number] Date of Birth: 03-24-1955

## 2015-05-19 ENCOUNTER — Ambulatory Visit
Admission: RE | Admit: 2015-05-19 | Discharge: 2015-05-19 | Disposition: A | Discharge status: Home / Self Care | Payer: Federal, State, Local not specified - PPO | Source: Ambulatory Visit | Attending: Radiation Oncology | Admitting: Radiation Oncology

## 2015-05-19 DIAGNOSIS — Z170 Estrogen receptor positive status [ER+]: Secondary | ICD-10-CM | POA: Diagnosis not present

## 2015-05-19 DIAGNOSIS — C50212 Malignant neoplasm of upper-inner quadrant of left female breast: Secondary | ICD-10-CM | POA: Diagnosis not present

## 2015-05-19 DIAGNOSIS — Z510 Encounter for antineoplastic radiation therapy: Secondary | ICD-10-CM | POA: Diagnosis not present

## 2015-05-20 ENCOUNTER — Ambulatory Visit: Payer: Federal, State, Local not specified - PPO | Admitting: Physical Therapy

## 2015-05-20 ENCOUNTER — Ambulatory Visit
Admission: RE | Admit: 2015-05-20 | Discharge: 2015-05-20 | Disposition: A | Discharge status: Home / Self Care | Payer: Federal, State, Local not specified - PPO | Source: Ambulatory Visit | Attending: Radiation Oncology | Admitting: Radiation Oncology

## 2015-05-20 ENCOUNTER — Encounter: Payer: Self-pay | Admitting: Physical Therapy

## 2015-05-20 DIAGNOSIS — C50212 Malignant neoplasm of upper-inner quadrant of left female breast: Secondary | ICD-10-CM | POA: Diagnosis not present

## 2015-05-20 DIAGNOSIS — M25612 Stiffness of left shoulder, not elsewhere classified: Secondary | ICD-10-CM | POA: Diagnosis not present

## 2015-05-20 DIAGNOSIS — R209 Unspecified disturbances of skin sensation: Secondary | ICD-10-CM

## 2015-05-20 DIAGNOSIS — M79671 Pain in right foot: Secondary | ICD-10-CM

## 2015-05-20 DIAGNOSIS — M25512 Pain in left shoulder: Secondary | ICD-10-CM | POA: Diagnosis not present

## 2015-05-20 DIAGNOSIS — M79672 Pain in left foot: Secondary | ICD-10-CM | POA: Diagnosis not present

## 2015-05-20 DIAGNOSIS — Z510 Encounter for antineoplastic radiation therapy: Secondary | ICD-10-CM | POA: Diagnosis not present

## 2015-05-20 DIAGNOSIS — Z170 Estrogen receptor positive status [ER+]: Secondary | ICD-10-CM | POA: Diagnosis not present

## 2015-05-20 DIAGNOSIS — M6281 Muscle weakness (generalized): Secondary | ICD-10-CM | POA: Diagnosis not present

## 2015-05-20 NOTE — Therapy (Signed)
[place], [place], [address] Phone: [phone]   Fax:  [phone]  Physical Therapy Treatment  Patient Details  Name: Mary Randall MRN: [id number] Date of Birth: 1955/10/30 Referring Provider: Dr. Isidore Moos  Encounter Date: 05/20/2015      PT End of Session - 05/20/15 1723    Visit Number 9   Number of Visits 17   Date for PT Re-Evaluation 07/02/15   Authorization Type April cert performed   PT Start Time 1300   PT Stop Time 1344   PT Time Calculation (min) 44 min   Activity Tolerance Patient tolerated treatment well   Behavior During Therapy [facility] for tasks assessed/performed      Past Medical History  Diagnosis Date  . Hypertension   . 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 Family history of breast cancer   . Family history of pancreatic cancer   . Family history of breast cancer in female   . Angina effort ([place])     none recent  . NSTEMI (non-ST elevated myocardial infarction) ([place]) 03/16/2014    non-obs CAD, spasm seen  . Pneumonia 2009  . Arthritis     "right knee" (03/16/2015)  . Breast cancer, left ([place]) dx'd 2016    Past Surgical History  Procedure Laterality Date  . Ectopic pregnancy surgery  1991    Tubal pregnancy  . Cardiac catheterization  2010; 03/16/2014  . Tubal ligation  1981  . Left heart catheterization with coronary angiogram N/A 03/16/2014    Procedure: LEFT HEART CATHETERIZATION WITH CORONARY ANGIOGRAM;  Surgeon: Sherren Mocha, MD; Non-obs CAD, EF 55%  . Bunionectomy Bilateral   . Portacath placement N/A 08/28/2014    Procedure: INSERTION PORT-A-CATH;  Surgeon: Autumn Messing III, MD;  Location: [place];  Service: General;  Laterality: N/A;  . Breast lumpectomy with needle localization and axillary lymph node dissection Left 03/15/2015    Procedure: LEFT BREAST LUMPECTOMY WITH BRACKETED NEEDLE LOCALIZATION AND AXILLARY LYMPH NODE DISSECTION;   Surgeon: Autumn Messing III, MD;  Location: [place];  Service: General;  Laterality: Left;  . Port-a-cath removal Right 03/15/2015    Procedure: REMOVAL PORT-A-CATH;  Surgeon: Autumn Messing III, MD;  Location: [place];  Service: General;  Laterality: Right;  . Breast biopsy Left 08/06/14  . Re-excision of breast cancer,superior margins Left 04/21/2015    Procedure: RE-EXCISION OF BREAST CANCER, INFERIOR MARGIN;  Surgeon: Autumn Messing III, MD;  Location: [facility];  Service: General;  Laterality: Left;    There were no vitals filed for this visit.      Subjective Assessment - 05/20/15 1302    Subjective The e stim was good. It helped some. My shoulder is doing good.    Pertinent History Left lumpectomy on 03/15/15 showed G2 invasive ductal carcinoma, 2cm, G2, and DCIS, G2. the lateral margin was positive for invasive tumor, 1 out of 15 nodes was positive. Pt requires another breast surgery in the next couple of weeks to clean margins. She also will begin radiation in approx 8-10 weeks. On 04/21/2015, pt had another surgery to get clear margins. She has not doing much since then,  She goes tomorrow for a CT scan     Patient Stated Goals for LUE to move like RUE   Currently in Pain? No/denies   Pain Score 0-No pain  [place] Adult PT Treatment/Exercise - 05/20/15 0001    Knee/Hip Exercises: Aerobic   Recumbent Bike 4 min at level 1  pt stated her leg was fatigued after 4 min   Shoulder Exercises: Supine   Horizontal ABduction Strengthening;Both;10 reps;Theraband   Theraband Level (Shoulder Horizontal ABduction) Level 2 (Red)   External Rotation Strengthening;Both;10 reps;Theraband   Theraband Level (Shoulder External Rotation) Level 2 (Red)   Flexion Strengthening;Both;Theraband;10 reps  Narrow and wide grip,    Theraband Level (Shoulder Flexion) Level 2 (Red)   Other Supine Exercises Bil D2 with red theraband x10 each side, pt returning good demo after tactile cues for  correct technique   Shoulder Exercises: Pulleys   Flexion 2 minutes   ABduction 2 minutes   Shoulder Exercises: Therapy Ball   Flexion 10 reps   ABduction 10 reps   Shoulder Exercises: Stretch   Corner Stretch 3 reps;10 seconds  cues to keep forearms on wall for better stretch   Electrical Stimulation   Electrical Stimulation Location plantar feet and Rt medial ankle, Lt medial ankle   Electrical Stimulation Action pre mod   Electrical Stimulation Parameters 80-150 Hz (output Rt 28, Lt 25)   Electrical Stimulation Goals Pain   Manual Therapy   Passive ROM In Supine with HOB elevated: Lt shoulder in to flexion, abduction, and ER all to pts tolerance.                   Short Term Clinic Goals - 05/12/15 [id number]    CC Short Term Goal  #1   Title Pt to demonstrate 120 degrees of left shoulder flexion to allow pt to reach items overhead.   Baseline 89 degrees, 140 degrees 05/12/15   Status Achieved   CC Short Term Goal  #2   Title Pt to demonstrate 120 degrees of left shoulder abduction to allow pt to reach items out to sides   Baseline 84, 123 degrees 05/12/15   Status Achieved   CC Short Term Goal  #3   Title Pt to be able to verbalize lymphedema risk reduction strategies   Status Achieved             Long Term Clinic Goals - 05/18/15 1046    CC Long Term Goal  #1   Title Pt to demonstrate 155 degrees of left shoulder flexion to allow pt to reach items on shelves   Baseline 89, 121 degrees 04/13/15, 140 degrees 05/12/15, 152 degrees 05/18/15   Status On-going   CC Long Term Goal  #2   Title Pt to demonstrate 165 degrees of left shoulder abduction to allow pt to return to prior level of function   Baseline 84, 96 degrees 04/13/15, 123 degrees 05/12/15, 139 degrees 05/18/15   Status On-going   CC Long Term Goal  #3   Title Pt to demonstrate 85 degrees of left shoulder IR to allow pt to return to prior level of function   Baseline 66,    CC Long Term Goal  #4   Title Pt  to be independent with a home exercise program for strengthening and ROM for long term management    Status On-going   CC Long Term Goal  #5   Title Pt to demonstrate gross LUE of 5/5 to allow pt to return to PLOF   Status On-going            Plan - 05/20/15 1724    Clinical Impression Statement Pt felt the electrical stimulation  is helping her feet. She forgot to call her insurance company about coverage for a TENS unit. Pt is tolerating PROM and all of her exercises well.    Rehab Potential Good   Clinical Impairments Affecting Rehab Potential 2 breast surgeries, radiation soon    PT Frequency 2x / week   PT Duration 8 weeks   PT Treatment/Interventions Manual techniques;Therapeutic exercise;Taping;Passive range of motion;Scar mobilization;ADLs/Self Care Home Management;Manual lymph drainage;Compression bandaging;Patient/family education;Electrical Stimulation   PT Next Visit Plan Assess LUE strength for goal assess. Cont PROM/AAROM/AROM to left shoulder, review and add to HEP as needed,, assess if  e stim to feet was beneficial   PT Home Exercise Plan Supine scapular series with yellow theraband   Consulted and Agree with Plan of Care Patient      Patient will benefit from skilled therapeutic intervention in order to improve the following deficits and impairments:  Decreased range of motion, Pain, Impaired UE functional use, Decreased strength, Decreased activity tolerance, Decreased knowledge of precautions, Decreased knowledge of use of DME, Increased edema  Visit Diagnosis: Pain in left shoulder  Stiffness of left shoulder, not elsewhere classified  Pain in left foot  Muscle weakness (generalized)  Pain in right foot  Unspecified disturbances of skin sensation     Problem List Patient Active Problem List   Diagnosis Date Noted  . Breast cancer, female, left 03/15/2015  . UTI (urinary tract infection) 12/02/2014  . Chemotherapy induced nausea and vomiting  11/03/2014  . Anemia due to antineoplastic chemotherapy 11/03/2014  . Genetic testing 10/13/2014  . Family history of breast cancer   . Family history of pancreatic cancer   . Family history of breast cancer in female   . Breast cancer of upper-inner quadrant of left female breast ([place]) 08/16/2014  . BP (high blood pressure) 04/17/2014  . NSTEMI (non-ST elevated myocardial infarction) ([place]) 03/16/2014  . 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 Chest pain 09/04/2012  . Essential hypertension, benign 09/04/2012  . Angina pectoris with documented spasm ([place]) 03/05/2012  . Coronary artery spasm ([place]) 03/05/2012  . Arthritis of knee, degenerative 12/04/2011    Alexia Freestone 05/20/2015, 5:28 PM  [place], [place], [address] Phone: [phone]   Fax:  [phone]  Name: Mary Randall MRN: [id number] Date of Birth: 1955/10/11    Allyson Sabal, PT 05/20/2015 5:28 PM

## 2015-05-21 ENCOUNTER — Ambulatory Visit
Admission: RE | Admit: 2015-05-21 | Discharge: 2015-05-21 | Disposition: A | Discharge status: Home / Self Care | Payer: Federal, State, Local not specified - PPO | Source: Ambulatory Visit | Attending: Radiation Oncology | Admitting: Radiation Oncology

## 2015-05-21 DIAGNOSIS — Z510 Encounter for antineoplastic radiation therapy: Secondary | ICD-10-CM | POA: Diagnosis not present

## 2015-05-21 DIAGNOSIS — C50212 Malignant neoplasm of upper-inner quadrant of left female breast: Secondary | ICD-10-CM | POA: Diagnosis not present

## 2015-05-21 DIAGNOSIS — Z170 Estrogen receptor positive status [ER+]: Secondary | ICD-10-CM | POA: Diagnosis not present

## 2015-05-24 ENCOUNTER — Telehealth: Payer: Self-pay | Admitting: *Deleted

## 2015-05-24 ENCOUNTER — Ambulatory Visit
Admission: RE | Admit: 2015-05-24 | Discharge: 2015-05-24 | Disposition: A | Discharge status: Home / Self Care | Payer: Federal, State, Local not specified - PPO | Source: Ambulatory Visit | Attending: Radiation Oncology | Admitting: Radiation Oncology

## 2015-05-24 ENCOUNTER — Encounter: Payer: Self-pay | Admitting: Radiation Oncology

## 2015-05-24 VITALS — BP 118/80 | HR 79 | Temp 98.0°F | Ht 67.0 in | Wt 178.6 lb

## 2015-05-24 DIAGNOSIS — C50212 Malignant neoplasm of upper-inner quadrant of left female breast: Secondary | ICD-10-CM

## 2015-05-24 DIAGNOSIS — Z170 Estrogen receptor positive status [ER+]: Secondary | ICD-10-CM | POA: Diagnosis not present

## 2015-05-24 DIAGNOSIS — Z510 Encounter for antineoplastic radiation therapy: Secondary | ICD-10-CM | POA: Diagnosis not present

## 2015-05-24 NOTE — Progress Notes (Signed)
Mary Randall presents for her 6th fraction of radiation to her Left Breast. She denies fatigue and pain. She reports no issues with her skin at this time, and she is using the radiaplex cream twice daily.   BP 118/80 mmHg  Pulse 79  Temp(Src) 98 F (36.7 C)  Ht 5\' 7"  (1.702 m)  Wt 178 lb 9.6 oz (81.012 kg)  BMI 27.97 kg/m2

## 2015-05-24 NOTE — Telephone Encounter (Signed)
  Oncology Nurse Navigator Documentation    Navigator Encounter Type: Telephone (05/24/15 1500) Telephone: Outgoing Call (05/24/15 1500)         Patient Visit Type: RadOnc (05/24/15 1500) Treatment Phase: First Radiation Tx (05/24/15 1500)                            Time Spent with Patient: 15 (05/24/15 1500)

## 2015-05-24 NOTE — Progress Notes (Signed)
   Weekly Management Note:  outpatient    ICD-9-CM ICD-10-CM   1. Breast cancer of upper-inner quadrant of left female breast ([facility]) 174.2 C50.212     Current Dose:  12 Gy  Projected Dose: 60 Gy   Narrative:  The patient presents for routine under treatment assessment.  CBCT/MVCT images/Port film x-rays were reviewed.  The chart was checked. Doing well  Physical Findings:  height is 5\' 7"  (1.702 m) and weight is 178 lb 9.6 oz (81.012 kg). Her temperature is 98 F (36.7 C). Her blood pressure is 118/80 and her pulse is 79.   Wt Readings from Last 3 Encounters:  05/24/15 178 lb 9.6 oz (81.012 kg)  05/17/15 180 lb (81.647 kg)  04/21/15 176 lb 4 oz (79.946 kg)   Well appearing, skin slightly hyperpigmented over left breast   Impression:  The patient is tolerating radiotherapy.  Plan:  Continue radiotherapy as planned.    ________________________________   Eppie Gibson, M.D.

## 2015-05-25 ENCOUNTER — Ambulatory Visit: Payer: Federal, State, Local not specified - PPO | Admitting: Physical Therapy

## 2015-05-25 ENCOUNTER — Ambulatory Visit
Admission: RE | Admit: 2015-05-25 | Discharge: 2015-05-25 | Disposition: A | Discharge status: Home / Self Care | Payer: Federal, State, Local not specified - PPO | Source: Ambulatory Visit | Attending: Radiation Oncology | Admitting: Radiation Oncology

## 2015-05-25 DIAGNOSIS — C50212 Malignant neoplasm of upper-inner quadrant of left female breast: Secondary | ICD-10-CM | POA: Diagnosis not present

## 2015-05-25 DIAGNOSIS — Z170 Estrogen receptor positive status [ER+]: Secondary | ICD-10-CM | POA: Diagnosis not present

## 2015-05-25 DIAGNOSIS — Z510 Encounter for antineoplastic radiation therapy: Secondary | ICD-10-CM | POA: Diagnosis not present

## 2015-05-26 ENCOUNTER — Ambulatory Visit
Admission: RE | Admit: 2015-05-26 | Discharge: 2015-05-26 | Disposition: A | Discharge status: Home / Self Care | Payer: Federal, State, Local not specified - PPO | Source: Ambulatory Visit | Attending: Radiation Oncology | Admitting: Radiation Oncology

## 2015-05-26 DIAGNOSIS — Z510 Encounter for antineoplastic radiation therapy: Secondary | ICD-10-CM | POA: Diagnosis not present

## 2015-05-26 DIAGNOSIS — Z170 Estrogen receptor positive status [ER+]: Secondary | ICD-10-CM | POA: Diagnosis not present

## 2015-05-26 DIAGNOSIS — C50212 Malignant neoplasm of upper-inner quadrant of left female breast: Secondary | ICD-10-CM | POA: Diagnosis not present

## 2015-05-26 DIAGNOSIS — H35033 Hypertensive retinopathy, bilateral: Secondary | ICD-10-CM | POA: Diagnosis not present

## 2015-05-27 ENCOUNTER — Ambulatory Visit
Admission: RE | Admit: 2015-05-27 | Discharge: 2015-05-27 | Disposition: A | Discharge status: Home / Self Care | Payer: Federal, State, Local not specified - PPO | Source: Ambulatory Visit | Attending: Radiation Oncology | Admitting: Radiation Oncology

## 2015-05-27 ENCOUNTER — Ambulatory Visit: Payer: Federal, State, Local not specified - PPO

## 2015-05-27 DIAGNOSIS — M6281 Muscle weakness (generalized): Secondary | ICD-10-CM

## 2015-05-27 DIAGNOSIS — Z510 Encounter for antineoplastic radiation therapy: Secondary | ICD-10-CM | POA: Diagnosis not present

## 2015-05-27 DIAGNOSIS — M79672 Pain in left foot: Secondary | ICD-10-CM

## 2015-05-27 DIAGNOSIS — M25512 Pain in left shoulder: Secondary | ICD-10-CM

## 2015-05-27 DIAGNOSIS — M25612 Stiffness of left shoulder, not elsewhere classified: Secondary | ICD-10-CM | POA: Diagnosis not present

## 2015-05-27 DIAGNOSIS — Z170 Estrogen receptor positive status [ER+]: Secondary | ICD-10-CM | POA: Diagnosis not present

## 2015-05-27 DIAGNOSIS — M79671 Pain in right foot: Secondary | ICD-10-CM | POA: Diagnosis not present

## 2015-05-27 DIAGNOSIS — C50212 Malignant neoplasm of upper-inner quadrant of left female breast: Secondary | ICD-10-CM | POA: Diagnosis not present

## 2015-05-27 DIAGNOSIS — R209 Unspecified disturbances of skin sensation: Secondary | ICD-10-CM | POA: Diagnosis not present

## 2015-05-27 NOTE — Therapy (Addendum)
[facility] [address] [place], [place], [address] Phone: [phone]   Fax:  [phone]  Physical Therapy Treatment  Patient Details  Name: Mary Randall MRN: [id number] Date of Birth: 01/28/1955 Referring Provider: Dr. Squire  Encounter Date: 05/27/2015      PT End of Session - 05/27/15 1052    Visit Number 10   Number of Visits 17   Date for PT Re-Evaluation 07/02/15   PT Start Time 1022   PT Stop Time 1105   PT Time Calculation (min) 43 min   Activity Tolerance Patient tolerated treatment well   Behavior During Therapy WFL for tasks assessed/performed      Past Medical History  Diagnosis Date  . Hypertension   . 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 Family history of breast cancer   . Family history of pancreatic cancer   . Family history of breast cancer in female   . Angina effort ([facility])     none recent  . NSTEMI (non-ST elevated myocardial infarction) ([facility]) 03/16/2014    non-obs CAD, spasm seen  . Pneumonia 2009  . Arthritis     "right knee" (03/16/2015)  . Breast cancer, left ([facility]) dx'd 2016    Past Surgical History  Procedure Laterality Date  . Ectopic pregnancy surgery  1991    Tubal pregnancy  . Cardiac catheterization  2010; 03/16/2014  . Tubal ligation  1981  . Left heart catheterization with coronary angiogram N/A 03/16/2014    Procedure: LEFT HEART CATHETERIZATION WITH CORONARY ANGIOGRAM;  Surgeon: Michael Cooper, MD; Non-obs CAD, EF 55%  . Bunionectomy Bilateral   . Portacath placement N/A 08/28/2014    Procedure: INSERTION PORT-A-CATH;  Surgeon: Paul Toth III, MD;  Location[facility];  Service: General;  Laterality: N/A;  . Breast lumpectomy with needle localization and axillary lymph node dissection Left 03/15/2015    Procedure: LEFT BREAST LUMPECTOMY WITH BRACKETED NEEDLE LOCALIZATION AND AXILLARY LYMPH NODE DISSECTION;  Surgeon: Paul Toth III, MD;  Location: [facility];   Service: General;  Laterality: Left;  . Port-a-cath removal Right 03/15/2015    Procedure: REMOVAL PORT-A-CATH;  Surgeon: Paul Toth III, MD;  Location: [facility];  Service: General;  Laterality: Right;  . Breast biopsy Left 08/06/14  . Re-excision of breast cancer,superior margins Left 04/21/2015    Procedure: RE-EXCISION OF BREAST CANCER, INFERIOR MARGIN;  Surgeon: Paul Toth III, MD;  [facility];  Service: General;  Laterality: Left;    There were no vitals filed for this visit.      Subjective Assessment - 05/27/15 1032    Subjective My feet are slightly improved since last visit, the tingling is less. My Lt shoulder isn't keeping me from doing anything at home.    Pertinent History Left lumpectomy on 03/15/15 showed G2 invasive ductal carcinoma, 2cm, G2, and DCIS, G2. the lateral margin was positive for invasive tumor, 1 out of 15 nodes was positive. Pt requires another breast surgery in the next couple of weeks to clean margins. She also will begin radiation in approx 8-10 weeks. On 04/21/2015, pt had another surgery to get clear margins. She has not doing much since then,  She goes tomorrow for a CT scan     Patient Stated Goals for LUE to move like RUE   Currently in Pain? No/denies            [facility] PT Assessment - 05/27/15 0001    AROM     Left Shoulder Flexion 155 Degrees   Left Shoulder ABduction 148 Degrees   Left Shoulder Internal Rotation 69 Degrees   Strength   Strength Assessment Site Shoulder   Left Shoulder Flexion 4-/5   Left Shoulder ABduction 4-/5   Left Shoulder Internal Rotation 4+/5   Left Shoulder External Rotation 4/5                     [facility] Adult PT Treatment/Exercise - 05/27/15 0001    Shoulder Exercises: Standing   Other Standing Exercises Standing 3 way raises with 2 lbs x 10 each (flexion, scaption, and abduction to 90 degrees) pt returned correct therapist demonstration   Shoulder Exercises: Pulleys   Flexion 2 minutes   ABduction 2  minutes   Electrical Stimulation   Electrical Stimulation Location Had to place at mid lower legs due to not being able to feel IFC at feet/ankle area   Electrical Stimulation Action IFC   Electrical Stimulation Parameters 80-150 Hz (output Rt 28, Lt 25)   Electrical Stimulation Goals Pain;Other (comment)  CIPN symptoms   Manual Therapy   Passive ROM In Supine with HOB elevated: Lt shoulder in to flexion, abduction, and ER all to pts tolerance.                PT Education - 05/27/15 1109    Education provided Yes   Education Details Spent time verbally reviewing/assessing pts HEP and what she has and has not been doing. Enouraged her to increase her compliance to further her progress with therapy.    Person(s) Educated Patient   Methods Explanation   Comprehension Verbalized understanding           Short Term Clinic Goals - 05/12/15 [phone]    CC Short Term Goal  #1   Title Pt to demonstrate 120 degrees of left shoulder flexion to allow pt to reach items overhead.   Baseline 89 degrees, 140 degrees 05/12/15   Status Achieved   CC Short Term Goal  #2   Title Pt to demonstrate 120 degrees of left shoulder abduction to allow pt to reach items out to sides   Baseline 84, 123 degrees 05/12/15   Status Achieved   CC Short Term Goal  #3   Title Pt to be able to verbalize lymphedema risk reduction strategies   Status Achieved             Long Term Clinic Goals - 05/27/15 1035    CC Long Term Goal  #1   Title Pt to demonstrate 155 degrees of left shoulder flexion to allow pt to reach items on shelves   Baseline 89, 121 degrees 04/13/15, 140 degrees 05/12/15, 152 degrees 05/18/15, 155 degrees 05/27/15   Status Achieved   CC Long Term Goal  #2   Title Pt to demonstrate 165 degrees of left shoulder abduction to allow pt to return to prior level of function   Baseline 84, 96 degrees 04/13/15, 123 degrees 05/12/15, 139 degrees 05/18/15, 148 degrees 05/27/15   Status On-going   CC  Long Term Goal  #3   Title Pt to demonstrate 85 degrees of left shoulder IR to allow pt to return to prior level of function   Baseline 66, 69 degrees 05/27/15   Status On-going   CC Long Term Goal  #4   Title Pt to be independent with a home exercise program for strengthening and ROM for long term management    Status On-going  CC Long Term Goal  #5   Title Pt to demonstrate gross LUE of 5/5 to allow pt to return to PLOF   Baseline unable to assess secondary to pain, see MMT section   Status On-going            Plan - 05/27/15 1053    Clinical Impression Statement Electrical stimulation continues to give pt 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 Her A/ROM measurements have imporved showing good progress towards her goals. Pt also able to tolerate MMT today as her pain has improved so drastically (none with testing). Pt reports will call her insurance today to ask if they will cover another TENS unit.    Rehab Potential Good   Clinical Impairments Affecting Rehab Potential 2 breast surgeries, radiation soon    PT Frequency 2x / week   PT Duration 8 weeks   PT Treatment/Interventions Manual techniques;Therapeutic exercise;Taping;Passive range of motion;Scar mobilization;ADLs/Self Care Home Management;Manual lymph drainage;Compression bandaging;Patient/family education;Electrical Stimulation   PT Next Visit Plan Cont PROM/AAROM/AROM to left shoulder, review and add to HEP as needed,, assess if  e stim to feet was beneficial      Patient will benefit from skilled therapeutic intervention in order to improve the following deficits and impairments:  Decreased range of motion, Pain, Impaired UE functional use, Decreased strength, Decreased activity tolerance, Decreased knowledge of precautions, Decreased knowledge of use of DME, Increased edema  Visit Diagnosis: Pain in left shoulder  Stiffness of left shoulder, not elsewhere classified  Pain in left foot  Muscle weakness  (generalized)     Problem List Patient Active Problem List   Diagnosis Date Noted  . Breast cancer, female, left 03/15/2015  . UTI (urinary tract infection) 12/02/2014  . Chemotherapy induced nausea and vomiting 11/03/2014  . Anemia due to antineoplastic chemotherapy 11/03/2014  . Genetic testing 10/13/2014  . Family history of breast cancer   . Family history of pancreatic cancer   . Family history of breast cancer in female   . Breast cancer of upper-inner quadrant of left female breast (HCC) 08/16/2014  . BP (high blood pressure) 04/17/2014  . NSTEMI (non-ST elevated myocardial infarction) ([facility]) 03/16/2014  . Prinzmetal variant angina ([facility])   . Chest pain 09/04/2012  . Essential hypertension, benign 09/04/2012  . Angina pectoris with documented spasm ([facility]) 03/05/2012  . Coronary artery spasm ([facility]) 03/05/2012  . Arthritis of knee, degenerative 12/04/2011    ,  Ann, PTA 05/27/2015, 11:09 AM  [facility] [address] [place], [place], [address] Phone: [phone]   Fax:  [phone]  Name: Mary Randall MRN: [id number] Date of Birth: 03/29/1955  PHYSICAL THERAPY DISCHARGE SUMMARY  Visits from Start of Care: 10  Current functional level related to goals / functional outcomes: See above   Remaining deficits: See above   Education / Equipment: See above Plan: Patient agrees to discharge.  Patient goals were not met. Patient is being discharged due to not returning since the last visit.  ?????     Blaire Breedlove Blue, PT 01/24/17 9:54 AM   

## 2015-05-28 ENCOUNTER — Ambulatory Visit
Admission: RE | Admit: 2015-05-28 | Discharge: 2015-05-28 | Disposition: A | Discharge status: Home / Self Care | Payer: Federal, State, Local not specified - PPO | Source: Ambulatory Visit | Attending: Radiation Oncology | Admitting: Radiation Oncology

## 2015-05-28 DIAGNOSIS — Z510 Encounter for antineoplastic radiation therapy: Secondary | ICD-10-CM | POA: Diagnosis not present

## 2015-05-28 DIAGNOSIS — Z170 Estrogen receptor positive status [ER+]: Secondary | ICD-10-CM | POA: Diagnosis not present

## 2015-05-28 DIAGNOSIS — C50212 Malignant neoplasm of upper-inner quadrant of left female breast: Secondary | ICD-10-CM | POA: Diagnosis not present

## 2015-06-01 ENCOUNTER — Ambulatory Visit
Admission: RE | Admit: 2015-06-01 | Discharge: 2015-06-01 | Disposition: A | Discharge status: Home / Self Care | Payer: Federal, State, Local not specified - PPO | Source: Ambulatory Visit | Attending: Radiation Oncology | Admitting: Radiation Oncology

## 2015-06-01 DIAGNOSIS — Z170 Estrogen receptor positive status [ER+]: Secondary | ICD-10-CM | POA: Diagnosis not present

## 2015-06-01 DIAGNOSIS — Z510 Encounter for antineoplastic radiation therapy: Secondary | ICD-10-CM | POA: Diagnosis not present

## 2015-06-01 DIAGNOSIS — C50212 Malignant neoplasm of upper-inner quadrant of left female breast: Secondary | ICD-10-CM | POA: Diagnosis not present

## 2015-06-02 ENCOUNTER — Ambulatory Visit
Admission: RE | Admit: 2015-06-02 | Discharge: 2015-06-02 | Disposition: A | Discharge status: Home / Self Care | Payer: Federal, State, Local not specified - PPO | Source: Ambulatory Visit | Attending: Radiation Oncology | Admitting: Radiation Oncology

## 2015-06-02 ENCOUNTER — Encounter: Payer: Self-pay | Admitting: Radiation Oncology

## 2015-06-02 VITALS — BP 117/83 | HR 80 | Temp 98.1°F | Ht 67.0 in | Wt 178.9 lb

## 2015-06-02 DIAGNOSIS — Z170 Estrogen receptor positive status [ER+]: Secondary | ICD-10-CM | POA: Diagnosis not present

## 2015-06-02 DIAGNOSIS — C50212 Malignant neoplasm of upper-inner quadrant of left female breast: Secondary | ICD-10-CM | POA: Diagnosis not present

## 2015-06-02 DIAGNOSIS — Z510 Encounter for antineoplastic radiation therapy: Secondary | ICD-10-CM | POA: Diagnosis not present

## 2015-06-02 MED ORDER — RADIAPLEXRX EX GEL
Freq: Once | CUTANEOUS | Status: AC
Start: 2015-06-02 — End: 2015-06-02
  Administered 2015-06-02: 11:00:00 via TOPICAL

## 2015-06-02 NOTE — Progress Notes (Signed)
Ms. Sumption is here for her 12th fraction of radiation to her Left Breast. She denies fatigue this week, but did have some last week. She will rest when she feels tired. She denies pain. She has some mild redness and swelling to her left breast. She is using the Radiaplex cream as directed twice daily. She was provided with a second tube today.   BP 117/83 mmHg  Pulse 80  Temp(Src) 98.1 F (36.7 C)  Ht 5\' 7"  (1.702 m)  Wt 178 lb 14.4 oz (81.149 kg)  BMI 28.01 kg/m2  SpO2 100%   Wt Readings from Last 3 Encounters:  06/02/15 178 lb 14.4 oz (81.149 kg)  05/24/15 178 lb 9.6 oz (81.012 kg)  05/17/15 180 lb (81.647 kg)

## 2015-06-02 NOTE — Progress Notes (Signed)
  Radiation Oncology         [phone] ________________________________  Name: Mary Randall MRN: [id number]  Date: 06/02/2015  DOB: 31-May-1955  Weekly Radiation Therapy Management    ICD-9-CM ICD-10-CM   1. Breast cancer of upper-inner quadrant of left female breast (HCC) 174.2 C50.212 hyaluronate sodium (RADIAPLEXRX) gel     Current Dose: 24 Gy     Planned Dose:  60 Gy  Narrative . . . . . . . . The patient presents for routine under treatment assessment.                                 [name] is here for her 12th fraction of radiation to her Left Breast. She denies fatigue this week, but did have some last week. She will rest when she feels tired. She denies pain. She has some mild redness and swelling to her left breast. She is using the Radiaplex cream as directed twice daily. She was provided with a second tube today.                                  Set-up films were reviewed.                                 The chart was checked. Physical Findings. . .  height is 5\' 7"  (1.702 m) and weight is 178 lb 14.4 oz (81.149 kg). Her temperature is 98.1 F (36.7 C). Her blood pressure is 117/83 and her pulse is 80. Her oxygen saturation is 100%. . Weight essentially stable.  No significant changes. Mild hyperpigmentation changes to the left breast. Impression . . . . . . . The patient is tolerating radiation. Plan . . . . . . . . . . . . Continue treatment as planned.  ________________________________   Mary Promise, PhD, MD  This document serves as a record of services personally performed by Gery Pray, MD. It was created on his behalf by Darcus Austin, a trained medical scribe. The creation of this record is based on the scribe's personal observations and the provider's statements to them. This document has been checked and approved by the attending provider.

## 2015-06-03 ENCOUNTER — Ambulatory Visit
Admission: RE | Admit: 2015-06-03 | Discharge: 2015-06-03 | Disposition: A | Discharge status: Home / Self Care | Payer: Federal, State, Local not specified - PPO | Source: Ambulatory Visit | Attending: Radiation Oncology | Admitting: Radiation Oncology

## 2015-06-03 DIAGNOSIS — Z170 Estrogen receptor positive status [ER+]: Secondary | ICD-10-CM | POA: Diagnosis not present

## 2015-06-03 DIAGNOSIS — C50212 Malignant neoplasm of upper-inner quadrant of left female breast: Secondary | ICD-10-CM | POA: Diagnosis not present

## 2015-06-03 DIAGNOSIS — Z510 Encounter for antineoplastic radiation therapy: Secondary | ICD-10-CM | POA: Diagnosis not present

## 2015-06-04 ENCOUNTER — Ambulatory Visit
Admission: RE | Admit: 2015-06-04 | Discharge: 2015-06-04 | Disposition: A | Discharge status: Home / Self Care | Payer: Federal, State, Local not specified - PPO | Source: Ambulatory Visit | Attending: Radiation Oncology | Admitting: Radiation Oncology

## 2015-06-04 DIAGNOSIS — Z170 Estrogen receptor positive status [ER+]: Secondary | ICD-10-CM | POA: Diagnosis not present

## 2015-06-04 DIAGNOSIS — C50212 Malignant neoplasm of upper-inner quadrant of left female breast: Secondary | ICD-10-CM | POA: Diagnosis not present

## 2015-06-04 DIAGNOSIS — Z510 Encounter for antineoplastic radiation therapy: Secondary | ICD-10-CM | POA: Diagnosis not present

## 2015-06-07 ENCOUNTER — Ambulatory Visit
Admission: RE | Admit: 2015-06-07 | Discharge: 2015-06-07 | Disposition: A | Discharge status: Home / Self Care | Payer: Federal, State, Local not specified - PPO | Source: Ambulatory Visit | Attending: Radiation Oncology | Admitting: Radiation Oncology

## 2015-06-07 ENCOUNTER — Encounter: Payer: Self-pay | Admitting: Radiation Oncology

## 2015-06-07 VITALS — BP 119/82 | HR 78 | Temp 98.0°F | Ht 67.0 in | Wt 177.8 lb

## 2015-06-07 DIAGNOSIS — C50212 Malignant neoplasm of upper-inner quadrant of left female breast: Secondary | ICD-10-CM

## 2015-06-07 DIAGNOSIS — Z510 Encounter for antineoplastic radiation therapy: Secondary | ICD-10-CM | POA: Diagnosis not present

## 2015-06-07 DIAGNOSIS — Z170 Estrogen receptor positive status [ER+]: Secondary | ICD-10-CM | POA: Diagnosis not present

## 2015-06-07 NOTE — Progress Notes (Signed)
Ms. Beecher presents for her 15th fraction of radiation to her Left Breast. She reports some mild fatigue. She denies any pain to her Left breast. She does have some mild hyperpigmentation present. She is using the Radiaplex cream as directed twice daily.   BP 119/82 mmHg  Pulse 78  Temp(Src) 98 F (36.7 C)  Ht 5\' 7"  (1.702 m)  Wt 177 lb 12.8 oz (80.65 kg)  BMI 27.84 kg/m2

## 2015-06-07 NOTE — Progress Notes (Signed)
   Weekly Management Note:  outpatient    ICD-9-CM ICD-10-CM   1. Breast cancer of upper-inner quadrant of left female breast (HCC) 174.2 C50.212     Current Dose:  30 Gy  Projected Dose: 60 Gy   Narrative:  The patient presents for routine under treatment assessment.  CBCT/MVCT images/Port film x-rays were reviewed.  The chart was checked. Doing well, with mild fatigue.  Physical Findings:  height is 5\' 7"  (1.702 m) and weight is 177 lb 12.8 oz (80.65 kg). Her temperature is 98 F (36.7 C). Her blood pressure is 119/82 and her pulse is 78.   Wt Readings from Last 3 Encounters:  06/07/15 177 lb 12.8 oz (80.65 kg)  06/02/15 178 lb 14.4 oz (81.149 kg)  05/24/15 178 lb 9.6 oz (81.012 kg)   Well appearing, skin slightly hyperpigmented over left breast ; skin intact  Impression:  The patient is tolerating radiotherapy.  Plan:  Continue radiotherapy as planned.    ________________________________   Eppie Gibson, M.D.

## 2015-06-08 ENCOUNTER — Ambulatory Visit
Admission: RE | Admit: 2015-06-08 | Discharge: 2015-06-08 | Disposition: A | Discharge status: Home / Self Care | Payer: Federal, State, Local not specified - PPO | Source: Ambulatory Visit | Attending: Radiation Oncology | Admitting: Radiation Oncology

## 2015-06-08 DIAGNOSIS — Z170 Estrogen receptor positive status [ER+]: Secondary | ICD-10-CM | POA: Diagnosis not present

## 2015-06-08 DIAGNOSIS — Z510 Encounter for antineoplastic radiation therapy: Secondary | ICD-10-CM | POA: Diagnosis not present

## 2015-06-08 DIAGNOSIS — C50212 Malignant neoplasm of upper-inner quadrant of left female breast: Secondary | ICD-10-CM | POA: Diagnosis not present

## 2015-06-09 ENCOUNTER — Ambulatory Visit
Admission: RE | Admit: 2015-06-09 | Discharge: 2015-06-09 | Disposition: A | Discharge status: Home / Self Care | Payer: Federal, State, Local not specified - PPO | Source: Ambulatory Visit | Attending: Radiation Oncology | Admitting: Radiation Oncology

## 2015-06-09 DIAGNOSIS — Z170 Estrogen receptor positive status [ER+]: Secondary | ICD-10-CM | POA: Diagnosis not present

## 2015-06-09 DIAGNOSIS — Z510 Encounter for antineoplastic radiation therapy: Secondary | ICD-10-CM | POA: Diagnosis not present

## 2015-06-09 DIAGNOSIS — C50212 Malignant neoplasm of upper-inner quadrant of left female breast: Secondary | ICD-10-CM | POA: Diagnosis not present

## 2015-06-10 ENCOUNTER — Telehealth: Payer: Self-pay | Admitting: Hematology

## 2015-06-10 ENCOUNTER — Ambulatory Visit
Admission: RE | Admit: 2015-06-10 | Discharge: 2015-06-10 | Disposition: A | Discharge status: Home / Self Care | Payer: Federal, State, Local not specified - PPO | Source: Ambulatory Visit | Attending: Radiation Oncology | Admitting: Radiation Oncology

## 2015-06-10 ENCOUNTER — Other Ambulatory Visit: Payer: Self-pay | Admitting: Hematology

## 2015-06-10 DIAGNOSIS — Z510 Encounter for antineoplastic radiation therapy: Secondary | ICD-10-CM | POA: Diagnosis not present

## 2015-06-10 DIAGNOSIS — C50212 Malignant neoplasm of upper-inner quadrant of left female breast: Secondary | ICD-10-CM | POA: Diagnosis not present

## 2015-06-10 DIAGNOSIS — Z170 Estrogen receptor positive status [ER+]: Secondary | ICD-10-CM | POA: Diagnosis not present

## 2015-06-10 NOTE — Telephone Encounter (Signed)
pt called to resched 6/9 apt to 6/12

## 2015-06-11 ENCOUNTER — Other Ambulatory Visit: Payer: Federal, State, Local not specified - PPO

## 2015-06-11 ENCOUNTER — Ambulatory Visit
Admission: RE | Admit: 2015-06-11 | Discharge: 2015-06-11 | Disposition: A | Discharge status: Home / Self Care | Payer: Federal, State, Local not specified - PPO | Source: Ambulatory Visit | Attending: Radiation Oncology | Admitting: Radiation Oncology

## 2015-06-11 ENCOUNTER — Ambulatory Visit: Payer: Federal, State, Local not specified - PPO | Admitting: Hematology

## 2015-06-11 ENCOUNTER — Telehealth: Payer: Self-pay | Admitting: Hematology

## 2015-06-11 DIAGNOSIS — Z170 Estrogen receptor positive status [ER+]: Secondary | ICD-10-CM | POA: Diagnosis not present

## 2015-06-11 DIAGNOSIS — Z510 Encounter for antineoplastic radiation therapy: Secondary | ICD-10-CM | POA: Diagnosis not present

## 2015-06-11 DIAGNOSIS — C50212 Malignant neoplasm of upper-inner quadrant of left female breast: Secondary | ICD-10-CM | POA: Diagnosis not present

## 2015-06-11 NOTE — Telephone Encounter (Signed)
Mary Randall and left message that Dr Burr Medico wanted to r/s appt to 7/3-gave time & date @9 :45-7/3

## 2015-06-14 ENCOUNTER — Ambulatory Visit: Payer: Federal, State, Local not specified - PPO | Admitting: Hematology

## 2015-06-14 ENCOUNTER — Encounter: Payer: Self-pay | Admitting: Radiation Oncology

## 2015-06-14 ENCOUNTER — Ambulatory Visit
Admission: RE | Admit: 2015-06-14 | Discharge: 2015-06-14 | Disposition: A | Discharge status: Home / Self Care | Payer: Federal, State, Local not specified - PPO | Source: Ambulatory Visit | Attending: Radiation Oncology | Admitting: Radiation Oncology

## 2015-06-14 ENCOUNTER — Other Ambulatory Visit: Payer: Federal, State, Local not specified - PPO

## 2015-06-14 VITALS — BP 122/81 | HR 78 | Temp 98.4°F | Ht 67.0 in | Wt 178.2 lb

## 2015-06-14 DIAGNOSIS — Z510 Encounter for antineoplastic radiation therapy: Secondary | ICD-10-CM | POA: Diagnosis not present

## 2015-06-14 DIAGNOSIS — C50212 Malignant neoplasm of upper-inner quadrant of left female breast: Secondary | ICD-10-CM | POA: Diagnosis not present

## 2015-06-14 DIAGNOSIS — Z170 Estrogen receptor positive status [ER+]: Secondary | ICD-10-CM | POA: Diagnosis not present

## 2015-06-14 NOTE — Progress Notes (Signed)
   Weekly Management Note:  outpatient    ICD-9-CM ICD-10-CM   1. Breast cancer of upper-inner quadrant of left female breast (HCC) 174.2 C50.212     Current Dose:  40 Gy  Projected Dose: 60 Gy   Narrative:  The patient presents for routine under treatment assessment.  CBCT/MVCT images/Port film x-rays were reviewed.  The chart was checked. Doing well, with mild fatigue. Left breast is slightly tender  Physical Findings:  height is 5\' 7"  (1.702 m) and weight is 178 lb 3.2 oz (80.831 kg). Her temperature is 98.4 F (36.9 C). Her blood pressure is 122/81 and her pulse is 78.   Wt Readings from Last 3 Encounters:  06/14/15 178 lb 3.2 oz (80.831 kg)  06/07/15 177 lb 12.8 oz (80.65 kg)  06/02/15 178 lb 14.4 oz (81.149 kg)   Well appearing, skin moderately hyperpigmented over left breast ; skin intact  Impression:  The patient is tolerating radiotherapy.  Plan:  Continue radiotherapy as planned.    ________________________________   Eppie Gibson, M.D.

## 2015-06-14 NOTE — Progress Notes (Signed)
Mary Randall is here for her 20th fraction of radiation to her Left Breast. She denies pain. She does report some fatigue, and will nap in the afternoon. Her Left Breast is red and swollen and slightly tender. She has hyperpigmentation to her Left Axilla and underneath her breast. She is using the Radiaplex twice daily.   BP 122/81 mmHg  Pulse 78  Temp(Src) 98.4 F (36.9 C)  Ht 5\' 7"  (1.702 m)  Wt 178 lb 3.2 oz (80.831 kg)  BMI 27.90 kg/m2   Wt Readings from Last 3 Encounters:  06/14/15 178 lb 3.2 oz (80.831 kg)  06/07/15 177 lb 12.8 oz (80.65 kg)  06/02/15 178 lb 14.4 oz (81.149 kg)

## 2015-06-15 ENCOUNTER — Ambulatory Visit
Admission: RE | Admit: 2015-06-15 | Discharge: 2015-06-15 | Disposition: A | Discharge status: Home / Self Care | Payer: Federal, State, Local not specified - PPO | Source: Ambulatory Visit | Attending: Radiation Oncology | Admitting: Radiation Oncology

## 2015-06-15 DIAGNOSIS — C50212 Malignant neoplasm of upper-inner quadrant of left female breast: Secondary | ICD-10-CM | POA: Diagnosis not present

## 2015-06-15 DIAGNOSIS — Z170 Estrogen receptor positive status [ER+]: Secondary | ICD-10-CM | POA: Diagnosis not present

## 2015-06-15 DIAGNOSIS — Z510 Encounter for antineoplastic radiation therapy: Secondary | ICD-10-CM | POA: Diagnosis not present

## 2015-06-16 ENCOUNTER — Ambulatory Visit
Admission: RE | Admit: 2015-06-16 | Discharge: 2015-06-16 | Disposition: A | Discharge status: Home / Self Care | Payer: Federal, State, Local not specified - PPO | Source: Ambulatory Visit | Attending: Radiation Oncology | Admitting: Radiation Oncology

## 2015-06-16 DIAGNOSIS — Z510 Encounter for antineoplastic radiation therapy: Secondary | ICD-10-CM | POA: Diagnosis not present

## 2015-06-16 DIAGNOSIS — Z170 Estrogen receptor positive status [ER+]: Secondary | ICD-10-CM | POA: Diagnosis not present

## 2015-06-16 DIAGNOSIS — C50212 Malignant neoplasm of upper-inner quadrant of left female breast: Secondary | ICD-10-CM | POA: Diagnosis not present

## 2015-06-17 ENCOUNTER — Ambulatory Visit
Admission: RE | Admit: 2015-06-17 | Discharge: 2015-06-17 | Disposition: A | Discharge status: Home / Self Care | Payer: Federal, State, Local not specified - PPO | Source: Ambulatory Visit | Attending: Radiation Oncology | Admitting: Radiation Oncology

## 2015-06-17 DIAGNOSIS — Z510 Encounter for antineoplastic radiation therapy: Secondary | ICD-10-CM | POA: Diagnosis not present

## 2015-06-17 DIAGNOSIS — C50212 Malignant neoplasm of upper-inner quadrant of left female breast: Secondary | ICD-10-CM | POA: Diagnosis not present

## 2015-06-17 DIAGNOSIS — Z170 Estrogen receptor positive status [ER+]: Secondary | ICD-10-CM | POA: Diagnosis not present

## 2015-06-18 ENCOUNTER — Ambulatory Visit
Admission: RE | Admit: 2015-06-18 | Discharge: 2015-06-18 | Disposition: A | Discharge status: Home / Self Care | Payer: Federal, State, Local not specified - PPO | Source: Ambulatory Visit | Attending: Radiation Oncology | Admitting: Radiation Oncology

## 2015-06-18 DIAGNOSIS — C50212 Malignant neoplasm of upper-inner quadrant of left female breast: Secondary | ICD-10-CM | POA: Diagnosis not present

## 2015-06-18 DIAGNOSIS — Z170 Estrogen receptor positive status [ER+]: Secondary | ICD-10-CM | POA: Diagnosis not present

## 2015-06-18 DIAGNOSIS — Z510 Encounter for antineoplastic radiation therapy: Secondary | ICD-10-CM | POA: Diagnosis not present

## 2015-06-21 ENCOUNTER — Ambulatory Visit
Admission: RE | Admit: 2015-06-21 | Discharge: 2015-06-21 | Disposition: A | Discharge status: Home / Self Care | Payer: Federal, State, Local not specified - PPO | Source: Ambulatory Visit | Attending: Radiation Oncology | Admitting: Radiation Oncology

## 2015-06-21 ENCOUNTER — Encounter: Payer: Self-pay | Admitting: Radiation Oncology

## 2015-06-21 ENCOUNTER — Ambulatory Visit: Payer: Federal, State, Local not specified - PPO | Admitting: Radiation Oncology

## 2015-06-21 VITALS — BP 121/80 | HR 79 | Temp 98.4°F | Ht 67.0 in | Wt 180.2 lb

## 2015-06-21 DIAGNOSIS — C50912 Malignant neoplasm of unspecified site of left female breast: Secondary | ICD-10-CM

## 2015-06-21 DIAGNOSIS — Z510 Encounter for antineoplastic radiation therapy: Secondary | ICD-10-CM | POA: Diagnosis not present

## 2015-06-21 DIAGNOSIS — Z170 Estrogen receptor positive status [ER+]: Secondary | ICD-10-CM | POA: Diagnosis not present

## 2015-06-21 DIAGNOSIS — C50212 Malignant neoplasm of upper-inner quadrant of left female breast: Secondary | ICD-10-CM | POA: Insufficient documentation

## 2015-06-21 MED ORDER — RADIAPLEXRX EX GEL
Freq: Once | CUTANEOUS | Status: AC
Start: 2015-06-21 — End: 2015-06-21
  Administered 2015-06-21: 10:00:00 via TOPICAL

## 2015-06-21 NOTE — Progress Notes (Signed)
   Weekly Management Note:  outpatient    ICD-9-CM ICD-10-CM   1. Breast cancer, female, left 174.9 C50.912 hyaluronate sodium (RADIAPLEXRX) gel  2. Breast cancer of upper-inner quadrant of left female breast (HCC) 174.2 C50.212     Current Dose:  50 Gy  Projected Dose: 60 Gy   Narrative:  The patient presents for routine under treatment assessment.  CBCT/MVCT images/Port film x-rays were reviewed.  The chart was checked.  Applying neosporin to IM fold, radiaplex elsewhere  Physical Findings:  height is 5\' 7"  (1.702 m) and weight is 180 lb 3.2 oz (81.738 kg). Her temperature is 98.4 F (36.9 C). Her blood pressure is 121/80 and her pulse is 79.   Wt Readings from Last 3 Encounters:  06/21/15 180 lb 3.2 oz (81.738 kg)  06/14/15 178 lb 3.2 oz (80.831 kg)  06/07/15 177 lb 12.8 oz (80.65 kg)   Well appearing, skin  hyperpigmented over left breast ; skin intact except very superficial peeling, dry, punctate, at left IM fold.  Impression:  The patient is tolerating radiotherapy.  Plan:  Continue radiotherapy as planned.    ________________________________   Eppie Gibson, M.D.

## 2015-06-21 NOTE — Progress Notes (Signed)
Mary Randall is here for her 25th fraction of radiation to her Left Breast. She denies pain. She does report some fatigue, and will nap during the day. She reports that her Left Breast is tender, especially to the nipple area. She has some peeling noted underneath her Left Breast, and to her Left Axilla area and there is no drainage noted.. She has been applying neosporin to these areas.  Her Left Breast is swollen, red, and hyperpigmented. She is using Radiaplex twice daily, and was provided with a second tube today.   BP 121/80 mmHg  Pulse 79  Temp(Src) 98.4 F (36.9 C)  Ht 5\' 7"  (1.702 m)  Wt 180 lb 3.2 oz (81.738 kg)  BMI 28.22 kg/m2   Wt Readings from Last 3 Encounters:  06/21/15 180 lb 3.2 oz (81.738 kg)  06/14/15 178 lb 3.2 oz (80.831 kg)  06/07/15 177 lb 12.8 oz (80.65 kg)

## 2015-06-22 ENCOUNTER — Ambulatory Visit
Admission: RE | Admit: 2015-06-22 | Discharge: 2015-06-22 | Disposition: A | Discharge status: Home / Self Care | Payer: Federal, State, Local not specified - PPO | Source: Ambulatory Visit | Attending: Radiation Oncology | Admitting: Radiation Oncology

## 2015-06-22 DIAGNOSIS — Z170 Estrogen receptor positive status [ER+]: Secondary | ICD-10-CM | POA: Diagnosis not present

## 2015-06-22 DIAGNOSIS — Z510 Encounter for antineoplastic radiation therapy: Secondary | ICD-10-CM | POA: Diagnosis not present

## 2015-06-22 DIAGNOSIS — C50212 Malignant neoplasm of upper-inner quadrant of left female breast: Secondary | ICD-10-CM | POA: Diagnosis not present

## 2015-06-23 ENCOUNTER — Telehealth: Payer: Self-pay | Admitting: Cardiovascular Disease

## 2015-06-23 ENCOUNTER — Ambulatory Visit
Admission: RE | Admit: 2015-06-23 | Discharge: 2015-06-23 | Disposition: A | Discharge status: Home / Self Care | Payer: Federal, State, Local not specified - PPO | Source: Ambulatory Visit | Attending: Radiation Oncology | Admitting: Radiation Oncology

## 2015-06-23 DIAGNOSIS — C50212 Malignant neoplasm of upper-inner quadrant of left female breast: Secondary | ICD-10-CM | POA: Diagnosis not present

## 2015-06-23 DIAGNOSIS — Z510 Encounter for antineoplastic radiation therapy: Secondary | ICD-10-CM | POA: Diagnosis not present

## 2015-06-23 DIAGNOSIS — Z170 Estrogen receptor positive status [ER+]: Secondary | ICD-10-CM | POA: Diagnosis not present

## 2015-06-23 NOTE — Telephone Encounter (Signed)
[employer]  Pt calling to speak w/ RN- wanted to discuss f/u- was due to come back 05/2015 for 6 mo recall- no appt- requested to speak w/ rN.Please call back and discuss.

## 2015-06-24 ENCOUNTER — Ambulatory Visit
Admission: RE | Admit: 2015-06-24 | Discharge: 2015-06-24 | Disposition: A | Discharge status: Home / Self Care | Payer: Federal, State, Local not specified - PPO | Source: Ambulatory Visit | Attending: Radiation Oncology | Admitting: Radiation Oncology

## 2015-06-24 DIAGNOSIS — Z510 Encounter for antineoplastic radiation therapy: Secondary | ICD-10-CM | POA: Diagnosis not present

## 2015-06-24 DIAGNOSIS — C50212 Malignant neoplasm of upper-inner quadrant of left female breast: Secondary | ICD-10-CM | POA: Diagnosis not present

## 2015-06-24 DIAGNOSIS — Z170 Estrogen receptor positive status [ER+]: Secondary | ICD-10-CM | POA: Diagnosis not present

## 2015-06-25 ENCOUNTER — Ambulatory Visit
Admission: RE | Admit: 2015-06-25 | Discharge: 2015-06-25 | Disposition: A | Discharge status: Home / Self Care | Payer: Federal, State, Local not specified - PPO | Source: Ambulatory Visit | Attending: Radiation Oncology | Admitting: Radiation Oncology

## 2015-06-25 DIAGNOSIS — C50212 Malignant neoplasm of upper-inner quadrant of left female breast: Secondary | ICD-10-CM | POA: Diagnosis not present

## 2015-06-25 DIAGNOSIS — Z170 Estrogen receptor positive status [ER+]: Secondary | ICD-10-CM | POA: Diagnosis not present

## 2015-06-25 DIAGNOSIS — Z510 Encounter for antineoplastic radiation therapy: Secondary | ICD-10-CM | POA: Diagnosis not present

## 2015-06-25 NOTE — Telephone Encounter (Signed)
I spoke with the pt and appointment scheduled on 07/26/15 with Dr Burt Knack.

## 2015-06-25 NOTE — Progress Notes (Signed)
   Weekly Management Note:  outpatient    ICD-9-CM ICD-10-CM   1. Breast cancer of upper-inner quadrant of left female breast (HCC) 174.2 C50.212     Current Dose:  60 Gy  Projected Dose: 60 Gy   Narrative:  The patient presents for routine under treatment assessment.  CBCT/MVCT images/Port film x-rays were reviewed.  The chart was checked.  Applying neosporin to left axilla and IM fold, radiaplex elsewhere   Physical Findings:  height is 5\' 7"  (1.702 m) and weight is 179 lb 12.8 oz (81.557 kg). Her temperature is 98.4 F (36.9 C). Her blood pressure is 112/81 and her pulse is 77.   Wt Readings from Last 3 Encounters:  06/28/15 179 lb 12.8 oz (81.557 kg)  06/21/15 180 lb 3.2 oz (81.738 kg)  06/14/15 178 lb 3.2 oz (80.831 kg)    Well appearing, skin  hyperpigmented over left breast ; skin   with  superficial peeling at left axilla and at left IM fold.  Impression:  The patient has tolerated radiotherapy.  Plan:  F/u in 47mo and continue current skin care.  Hydrogel pads and Telfa pads provided to soothe skin and keep it dry. ________________________________   Eppie Gibson, M.D.

## 2015-06-28 ENCOUNTER — Ambulatory Visit
Admission: RE | Admit: 2015-06-28 | Discharge: 2015-06-28 | Disposition: A | Discharge status: Home / Self Care | Payer: Federal, State, Local not specified - PPO | Source: Ambulatory Visit | Attending: Radiation Oncology | Admitting: Radiation Oncology

## 2015-06-28 ENCOUNTER — Encounter: Payer: Self-pay | Admitting: Radiation Oncology

## 2015-06-28 ENCOUNTER — Telehealth: Payer: Self-pay | Admitting: *Deleted

## 2015-06-28 VITALS — BP 112/81 | HR 77 | Temp 98.4°F | Ht 67.0 in | Wt 179.8 lb

## 2015-06-28 DIAGNOSIS — C50212 Malignant neoplasm of upper-inner quadrant of left female breast: Secondary | ICD-10-CM | POA: Diagnosis not present

## 2015-06-28 DIAGNOSIS — Z510 Encounter for antineoplastic radiation therapy: Secondary | ICD-10-CM | POA: Insufficient documentation

## 2015-06-28 NOTE — Progress Notes (Signed)
Patient was given hydrogel pads per Dr. Isidore Moos.

## 2015-06-28 NOTE — Telephone Encounter (Signed)
Left message to follow up after radiation completion.   

## 2015-06-28 NOTE — Progress Notes (Signed)
Ms. Poth completes today - Radiation to the left breast.  Note hyperpigmentation and dequamation of left axillary region,and the inframmary fold without moistness.  She She reforts tenderness in these areas, but no actually pain at this time. Given Telfa pads tp lace within then inframmary fold and axillary region  BP 112/81 mmHg  Pulse 77  Temp(Src) 98.4 F (36.9 C)  Ht 5\' 7"  (1.702 m)  Wt 179 lb 12.8 oz (81.557 kg)  BMI 28.15 kg/m2 She reports fatigue and naps during the day.

## 2015-07-02 NOTE — Progress Notes (Signed)
  Radiation Oncology         [phone] ________________________________  Name: Mary Randall MRN: [id number]  Date: 06/28/2015  DOB: December 13, 1955  End of Treatment Note  Diagnosis:   Left breast cancer      Indication for treatment:  Curative        Radiation treatment dates:   05/17/15 - 06/28/15  Site/dose:    1) Left breast treated to 50 Gy in 25 fractions 2) Left SCLV and PAB treated to 45 Gy in 25 fractions 3) Left breast boost treated to 10 Gy in 5 fractions   Beams/energy:    1) 3D/ 10X 2) 3D/ 10X, 6X 3) Isodose Plan / 10X, 6X  Narrative: The patient tolerated radiation treatment relatively well.     Plan: The patient has completed radiation treatment. The patient will return to radiation oncology clinic for routine followup in one month. I advised them to call or return sooner if they have any questions or concerns related to their recovery or treatment.  -----------------------------------  Eppie Gibson, MD   This document serves as a record of services personally performed by Eppie Gibson, MD. It was created on her behalf by Derek Mound, a trained medical scribe. The creation of this record is based on the scribe's personal observations and the provider's statements to them. This document has been checked and approved by the attending provider.

## 2015-07-05 ENCOUNTER — Telehealth: Payer: Self-pay | Admitting: Hematology

## 2015-07-05 ENCOUNTER — Other Ambulatory Visit (HOSPITAL_BASED_OUTPATIENT_CLINIC_OR_DEPARTMENT_OTHER): Payer: Federal, State, Local not specified - PPO

## 2015-07-05 ENCOUNTER — Encounter: Payer: Self-pay | Admitting: Hematology

## 2015-07-05 ENCOUNTER — Ambulatory Visit (HOSPITAL_BASED_OUTPATIENT_CLINIC_OR_DEPARTMENT_OTHER): Payer: Federal, State, Local not specified - PPO | Admitting: Hematology

## 2015-07-05 VITALS — BP 119/80 | HR 76 | Temp 98.7°F | Resp 18 | Ht 67.0 in | Wt 180.6 lb

## 2015-07-05 DIAGNOSIS — G620 Drug-induced polyneuropathy: Secondary | ICD-10-CM | POA: Diagnosis not present

## 2015-07-05 DIAGNOSIS — D6481 Anemia due to antineoplastic chemotherapy: Secondary | ICD-10-CM | POA: Diagnosis not present

## 2015-07-05 DIAGNOSIS — C50212 Malignant neoplasm of upper-inner quadrant of left female breast: Secondary | ICD-10-CM | POA: Diagnosis not present

## 2015-07-05 LAB — CBC WITH DIFFERENTIAL/PLATELET
BASO%: 0.6 % (ref 0.0–2.0)
BASOS ABS: 0 10*3/uL (ref 0.0–0.1)
EOS ABS: 0.1 10*3/uL (ref 0.0–0.5)
EOS%: 2 % (ref 0.0–7.0)
HCT: 36.4 % (ref 34.8–46.6)
HEMOGLOBIN: 11.7 g/dL (ref 11.6–15.9)
LYMPH%: 22.6 % (ref 14.0–49.7)
MCH: 27.9 pg (ref 25.1–34.0)
MCHC: 32.2 g/dL (ref 31.5–36.0)
MCV: 86.6 fL (ref 79.5–101.0)
MONO#: 0.4 10*3/uL (ref 0.1–0.9)
MONO%: 14.1 % — AB (ref 0.0–14.0)
NEUT#: 1.9 10*3/uL (ref 1.5–6.5)
NEUT%: 60.7 % (ref 38.4–76.8)
Platelets: 181 10*3/uL (ref 145–400)
RBC: 4.2 10*6/uL (ref 3.70–5.45)
RDW: 16.7 % — ABNORMAL HIGH (ref 11.2–14.5)
WBC: 3.1 10*3/uL — ABNORMAL LOW (ref 3.9–10.3)
lymph#: 0.7 10*3/uL — ABNORMAL LOW (ref 0.9–3.3)

## 2015-07-05 LAB — COMPREHENSIVE METABOLIC PANEL
ALBUMIN: 3.7 g/dL (ref 3.5–5.0)
ALK PHOS: 102 U/L (ref 40–150)
ALT: 28 U/L (ref 0–55)
AST: 22 U/L (ref 5–34)
Anion Gap: 10 mEq/L (ref 3–11)
BUN: 12.7 mg/dL (ref 7.0–26.0)
CHLORIDE: 106 meq/L (ref 98–109)
CO2: 27 mEq/L (ref 22–29)
Calcium: 10.5 mg/dL — ABNORMAL HIGH (ref 8.4–10.4)
Creatinine: 1 mg/dL (ref 0.6–1.1)
EGFR: 69 mL/min/{1.73_m2} — AB (ref >90)
GLUCOSE: 96 mg/dL (ref 70–140)
POTASSIUM: 3.9 meq/L (ref 3.5–5.1)
SODIUM: 143 meq/L (ref 136–145)
Total Bilirubin: 0.53 mg/dL (ref 0.20–1.20)
Total Protein: 6.9 g/dL (ref 6.4–8.3)

## 2015-07-05 MED ORDER — LETROZOLE 2.5 MG PO TABS: 2.5000 mg | ORAL_TABLET | Freq: Every day | ORAL | Status: DC

## 2015-07-05 NOTE — Telephone Encounter (Signed)
Gave pt cal & avs °

## 2015-07-05 NOTE — Progress Notes (Signed)
[place]  [phone]) [phone] [phone]  Clinic follow up Note   Patient Care Team: Katherina Mires, MD as PCP - General (Family Medicine) Autumn Messing III, MD as Consulting Physician (General Surgery) Sherren Mocha, MD as Consulting Physician (Cardiology)  07/05/2015  CHIEF COMPLAINTS:  Follow up left breast cancer   Oncology History   Breast cancer of upper-inner quadrant of left female breast [facility])   Staging form: Breast, AJCC 7th Edition     Clinical: Stage IIIA (T3, N1, M0) - Unsigned     Pathologic stage from 03/15/2015: Stage IIA (yT1c, N1a, cM0) - Signed by Truitt Merle, MD on 04/02/2015       Breast cancer of upper-inner quadrant of left female breast ([place])   08/06/2014 Initial Diagnosis Breast cancer of upper-inner quadrant of left female breast   08/06/2014 Initial Biopsy Left breast 9:30 o'clock position showed invasive ductal carcinoma, and 1 left axillary lymph node biopsy showed positive for metastatic carcinoma.   08/06/2014 Receptors her2 ER 100% positive, PR 5% positive, HER2-, Ki-67 50%   08/06/2014 Mammogram Diagnostic mammogram and ultrasound showed left breast mass with associated calcification measuring 5.5 cm, indeterminate a  left axillary lymph node was cortical thickening, circumscribed low density oval 98m mass at 12:00 within the left breast.   09/03/2014 - 01/26/2015 Adjuvant Chemotherapy ddAC every 2 weeks X4, followed by weekly Taxol X12    03/15/2015 Surgery left lumpectomy and axillary node dissection    03/15/2015 Pathology Results left lumpectomy showed G2 invasive ductal carcinoma, 2cm, G2, and DCIS, G2. the lateral margin was positive for invasive tumor, 1 out of 15 nodes was positive    04/21/2015 Surgery Re-excision for positive margin. Surgical path was negative for malignancy.   05/17/2015 - 06/28/2015 Radiation Therapy adjuvant breast radiation     HISTORY OF PRESENTING ILLNESS:  Mary Grill60y.o. female is here because of  recently newly diagnosed left breast cancer. She presents to the clinic with her daughter.  This was discovered by screening mammogram. Her prior mammogram was 2 years ago. She noticed mild left nipple retraction, no palpable mass, no other changes. She feels well overall, denies any significant pain except mild right knee pain from arthritis. She denies any change of her appetite, or recent weight loss. 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   Her family history is notable for breast cancer in her brother, pancreatic cancer in another brother, and colon cancer in her mother. She is postmenopausal, last menstrual period 8 years ago. She has a daughter and a son. She lives with her brother, and her daughter is likely going to stay with her.  CURRENT TREATMENT:  Pending adjuvant AI   INTERIM HISTORY KAutiereturns for follow up. She completed radiation one week ago, she had moderate fatigue and dermatitis from radaition. She is able to do ADLs, but not much other activities. She still has mild neuropathy, she also reports skin itchiness in the past 5-6 weeks, no skin rashes, no hot flush, no arthralgia lately. She has not restarted driving yet.   MEDICAL HISTORY:  Past Medical History  Diagnosis Date  . Hypertension   . [place]; 03[place]16[place]2014    Prinz-Metal's angina  . Family history of breast cancer   . Family history of pancreatic cancer   . Family history of breast cancer in female   . [place]     none recent  . NSTEMI (non-ST  elevated myocardial infarction) ([place]) 03/16/2014    non-obs CAD, spasm seen  . Pneumonia 2009  . Arthritis     "right knee" (03/16/2015)  . Breast cancer, left ([place]) dx'd 2016    SURGICAL HISTORY: Past Surgical History  Procedure Laterality Date  . Ectopic pregnancy surgery  1991    Tubal pregnancy  . Cardiac catheterization  2010; 03/16/2014  . Tubal ligation  1981  . Left heart catheterization with  coronary angiogram N/A 03/16/2014    Procedure: LEFT HEART CATHETERIZATION WITH CORONARY ANGIOGRAM;  Surgeon: Sherren Mocha, MD; Non-obs CAD, EF 55%  . Bunionectomy Bilateral   . Portacath placement N/A 08/28/2014    Procedure: INSERTION PORT-A-CATH;  Surgeon: Autumn Messing III, MD;  Location: [facility];  Service: General;  Laterality: N/A;  . Breast lumpectomy with needle localization and axillary lymph node dissection Left 03/15/2015    Procedure: LEFT BREAST LUMPECTOMY WITH BRACKETED NEEDLE LOCALIZATION AND AXILLARY LYMPH NODE DISSECTION;  Surgeon: Autumn Messing III, MD;  Location: [place];  Service: General;  Laterality: Left;  . Port-a-cath removal Right 03/15/2015    Procedure: REMOVAL PORT-A-CATH;  Surgeon: Autumn Messing III, MD;  Location: [place];  Service: General;  Laterality: Right;  . Breast biopsy Left 08/06/14  . Re-excision of breast cancer,superior margins Left 04/21/2015    Procedure: RE-EXCISION OF BREAST CANCER, INFERIOR MARGIN;  Surgeon: Autumn Messing III, MD;  Location: [facility];  Service: General;  Laterality: Left;   GYN HISTORY  Menarchal: 15 LMP: 51 Contraceptive: 4 HRT: no G2P2: no breast feeding     SOCIAL HISTORY: Social History   Social History  . Marital Status: Legally Separated    Spouse Name: N/A  . Number of Children: Daughter 34, son 50   . Years of Education: N/A   Occupational History  . Works for post office    Social History Main Topics  . Smoking status: Never Smoker   . Smokeless tobacco: Never Used  . Alcohol Use: No  . Drug Use: No  . Sexual Activity: No   Other Topics Concern  . Not on file   Social History Narrative    FAMILY HISTORY: Family History  Problem Relation Age of Onset  . Pancreatic cancer Brother 9  . Cancer Brother 40    breast cancer   . Cancer Mother 70    colon cancer   . Hypertension Mother   . Diabetes type II Mother   . CVA Father   . Healthy Sister   . Healthy Sister     ALLERGIES:  is allergic to carvedilol; codeine;  percocet; adhesive; and compazine.  MEDICATIONS:  Current Outpatient Prescriptions  Medication Sig Dispense Refill  . amLODipine (NORVASC) 10 MG tablet Take 1 tablet (10 mg total) by mouth every evening. (Patient taking differently: Take 10 mg by mouth daily before supper. ) 90 tablet 0  . Ascorbic Acid (VITAMIN C ADULT GUMMIES PO) Take 2 tablets by mouth daily.    Marland Kitchen atorvastatin (LIPITOR) 40 MG tablet Take 1 tablet (40 mg total) by mouth daily at 6 PM. (Patient taking differently: Take 40 mg by mouth daily before supper. ) 90 tablet 0  . emollient (RADIAGEL) gel Apply topically as needed for wound care.    . hydrochlorothiazide (HYDRODIURIL) 25 MG tablet Take 25 mg by mouth daily before supper.     Marland Kitchen HYDROcodone-acetaminophen (NORCO) 5-325 MG tablet Take 1-2 tablets by mouth every 6 (six) hours as needed. (Patient not taking: Reported on 06/14/2015) 30  tablet 0  . irbesartan (AVAPRO) 300 MG tablet take 1 tablet by mouth once daily (Patient taking differently: take 1 tablet by mouth daily before supper) 30 tablet 5  . nitroGLYCERIN (NITROSTAT) 0.4 MG SL tablet Place 1 tablet (0.4 mg total) under the tongue every 5 (five) minutes as needed for chest pain. 25 tablet 12  . non-metallic deodorant (ALRA) MISC Apply 1 application topically daily as needed.    . ondansetron (ZOFRAN ODT) 4 MG disintegrating tablet Take 1 tablet (4 mg total) by mouth every 8 (eight) hours as needed for nausea or vomiting. (Patient not taking: Reported on 06/28/2015) 20 tablet 0  . ondansetron (ZOFRAN) 8 MG tablet Take 1 tablet (8 mg total) by mouth 2 (two) times daily as needed for nausea or vomiting. Start on 3rd day after Chemotherapy (Patient not taking: Reported on 06/28/2015) 30 tablet 1  . potassium chloride (K-DUR) 10 MEQ tablet Take 1 tablet (10 mEq total) by mouth every evening. 90 tablet 0   No current facility-administered medications for this visit.    REVIEW OF SYSTEMS:   Constitutional: Denies fevers, chills  or abnormal night sweats Eyes: Denies blurriness of vision, double vision or watery eyes Ears, nose, mouth, throat, and face: Denies mucositis or sore throat Respiratory: Denies cough, dyspnea or wheezes Cardiovascular: Denies palpitation, chest discomfort or lower extremity swelling Gastrointestinal:  Denies nausea, heartburn or change in bowel habits Skin: Denies abnormal skin rashes Lymphatics: Denies new lymphadenopathy or easy bruising Neurological:Denies numbness, tingling or new weaknesses Behavioral/Psych: Mood is stable, no new changes  All other systems were reviewed with the patient and are negative.  PHYSICAL EXAMINATION: ECOG PERFORMANCE STATUS: 2-3  Filed Vitals:   07/05/15 1043  BP: 119/80  Pulse: 76  Temp: 98.7 F (37.1 C)  Resp: 18   Filed Weights   07/05/15 1043  Weight: 180 lb 9.6 oz (81.92 kg)    GENERAL:alert, no distress and comfortable SKIN: skin color, texture, turgor are normal, no rashes or significant lesions, (+) skin pigmentation nails, no significant nail thickening  EYES: normal, conjunctiva are pink and non-injected, sclera clear OROPHARYNX:no exudate, no erythema and lips, buccal mucosa, and tongue normal  NECK: supple, thyroid normal size, non-tender, without nodularity LYMPH:  no palpable lymphadenopathy in the cervical, axillary or inguinal LUNGS: clear to auscultation and percussion with normal breathing effort HEART: regular rate & rhythm and no murmurs and no lower extremity edema ABDOMEN:abdomen soft, non-tender and normal bowel sounds Musculoskeletal:no cyanosis of digits and no clubbing  PSYCH: alert & oriented x 3 with fluent speech NEURO: no focal motor/sensory deficits Breasts: Breast inspection showed them to be symmetrical with no nipple discharge.  The surgical incision in the left breast and axilla are well healed, Moderate skin pigmentation from radiation, no skin ulcer or peeling. Exam of both breasts and axilla revealed no  evidence of mass or adenopathy.   LABORATORY DATA:  I have reviewed the data as listed CBC Latest Ref Rng 07/05/2015 04/17/2015 04/02/2015  WBC 3.9 - 10.3 10e3/uL 3.1(L) 5.6 3.9  Hemoglobin 11.6 - 15.9 g/dL 11.7 11.5(L) 11.1(L)  Hematocrit 34.8 - 46.6 % 36.4 35.5(L) 34.5(L)  Platelets 145 - 400 10e3/uL 181 182 212    CMP Latest Ref Rng 07/05/2015 04/17/2015 04/02/2015  Glucose 70 - 140 mg/dl 96 90 101  BUN 7.0 - 26.0 mg/dL 12.7 9 13.5  Creatinine 0.6 - 1.1 mg/dL 1.0 1.26(H) 1.1  Sodium 136 - 145 mEq/L 143 138 143  Potassium 3.5 -  5.1 mEq/L 3.9 4.0 4.0  Chloride 101 - 111 mmol/L - 103 -  CO2 22 - 29 mEq/L '27 23 26  '$ Calcium 8.4 - 10.4 mg/dL 10.5(H) 10.7(H) 10.6(H)  Total Protein 6.4 - 8.3 g/dL 6.9 7.0 6.6  Total Bilirubin 0.20 - 1.20 mg/dL 0.53 0.6 0.45  Alkaline Phos 40 - 150 U/L 102 73 74  AST 5 - 34 U/L '22 21 11  '$ ALT 0 - 55 U/L '28 17 9    '$ PATHOLOGY REPORT: Diagnosis 03/15/2015 1. 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 - INTERMEDIATE GRADE DUCTAL CARCINOMA IN SITU. - DUCTAL CARCINOMA IN SITU IS FOCALLY 0.1 TO 0.2 CM TO SUPERIOR MARGIN. - DUCTAL CARCINOMA IN SITU IS FOCALLY 0.2 CM TO LATERAL MARGIN. - INVASIVE DUCTAL CARCINOMA INVOLVES LATERAL MARGIN. - PLEASE CORRELATE WITH LOCATION OF SPECIMENS NUMBER 2 (ADDITIONAL SUPERIOR MARGIN) AND NUMBER 3 (ADDITIONAL LATERAL MARGIN) FOR FINAL MARGIN STATUS. - INVASIVE DUCTAL CARCINOMA FOCALLY INVOLVES INFERIOR MARGIN. - OTHER MARGINS ARE NEGATIVE. - SEE ONCOLOGY TEMPLATE. 2. Breast, excision, Left additional superior margin - FOCAL INTERMEDIATE DUCTAL CARCINOMA IN SITU. - MARGIN IS NEGATIVE (TUMOR IS GREATER THAN 0.4 CM TO MARGIN). 3. Breast, excision, Left additional lateral margin - BENIGN BREAST PARENCHYMA WITH FIBROCYSTIC CHANGES. - NO TUMOR SEEN. 4. Lymph nodes, regional resection, Left axillary contents - ONE OF FIFTEEN LYMPH NODES POSITIVE FOR METASTATIC DUCTAL CARCINOMA  (1/15). Microscopic Comment 1. BREAST, INVASIVE TUMOR, WITH LYMPH NODES PRESENT Specimen, including laterality and lymph node sampling (sentinel, non-sentinel): Left partial breast with left additional superior margin and left additional lateral margin; left non-sentinel lymph nodes. Procedure: Left breast lumpectomy with additional left superior margin and additional left lateral margin excisions; left axillary lymph node dissection. Histologic type: Invasive ductal carcinoma. Grade: Tubule formation: 3. Nuclear pleomorphism: 2. Mitotic:1. Tumor size (gross measurement): 2 cm. Margins: Invasive, distance to closest margin: Invasive ductal carcinoma involves lateral margin on initial lumpectomy specimen but additional lateral margin (specimen number 3) is negative for invasive ductal carcinoma; invasive ductal carcinoma focally involves inferior margin. In-situ, distance to closest margin: Ductal carcinoma in situ is focally 0.1 to 0.2 cm to superior margin on initial lumpectomy specimen but is at least 0.4 cm away from additional superior margin (see specimen #4).. If margin positive, focally or broadly: Initial breast lumpectomy demonstrates fairly broad involvement of the lateral margin; however, the additional lateral margin excision is negative for invasive ductal carcinoma; invasive ductal carcinoma focally involves the inferior margin. Lymphovascular invasion: Definitive lymphovascular invasion is not identified; however, a lymph node is positive for metastatic ductal carcinoma, see below. Ductal carcinoma in situ: Yes. Grade: Intermediate grade. Extensive intraductal component: No. Lobular neoplasia: Not identified. Tumor focality: Unifocal. Treatment effect: Some degree of treatment effect is present in the breast tissue. If present, treatment effect in breast tissue, lymph nodes or both: Treatment effect is only seen in breast tissue. Extent of tumor: Tumor present in breast  tissue. Skin: Not received. Nipple: Not received. Skeletal muscle: Not received. Lymph nodes: Examined: 0 Sentinel. 15 Non-sentinel. 15 Total. Lymph nodes with metastasis: 1. Isolated tumor cells (< 0.2 mm): 0.  Microscopic Comment(continued) Micrometastasis: (> 0.2 mm and < 2.0 mm): 0. Macrometastasis: (> 2.0 mm): 1. Extracapsular extension: Not identified. Breast prognostic profile: Performed on previous case, [phone] Estrogen receptor: 100%, positive. Progesterone receptor: 5%, positive. Her-2 neu: 1.47 ratio, negative. Ki-67: 50%. Non-neoplastic breast: Fibrocystic changes. TNM: ypT1c, ypN1a. Comments: As Her-2 neu was previously negative,  this will be repeated on representative current tumor and reported in an addendum to follow. Dr. Lyndon Code has seen selected slides (1A, 1G and 2I) in consultation with agreement that invasive ductal carcinoma involves the lateral margin and focally involves the inferior margin and that ductal carcinoma in situ is focally 0.1 to 0.2 cm to superior margin. He is also in agreement that the findings in slide 2I represent focal ductal carcinoma in situ. (RH:ecj 03/17/2015)  Results: HER2 - NEGATIVE RATIO OF HER2/CEP17 SIGNALS 1.62 AVERAGE HER2 COPY NUMBER PER CELL 2.35  Diagnosis 04/21/2015 Breast, excision, left inferior margin BREAST TISSUE WITH PREVIOUS RESECTION SITE CHANGES NEGATIVE FOR MALIGNANCY   RADIOGRAPHIC STUDIES: I have personally reviewed the radiological images as listed and agreed with the findings in the report.  Echo 08/24/2014 Study Conclusions  - Left ventricle: The cavity size was normal. Wall thickness was normal. Systolic function was normal. The estimated ejection fraction was in the range of 55% to 60%. - Left atrium: The atrium was mildly dilated.  MR breast b/l w wo contrast 02/01/2015  IMPRESSION: Interval decrease in size of retroareolar mass within the left    breast with decreased conspicuity of  previously described non mass enhancement extending from the posterior margin of the mass.  Interval decrease in cortical thickening of previously described left axillary lymph nodes.  RECOMMENDATION: Treatment plan for left breast malignancy.    ASSESSMENT & PLAN:  60 year old African-American female, postmenopausal, healthy and fit, who was found to have a left breast mass and a biopsy confirmed node metastasis by screening mammogram.  1. Left breast invasive ductal carcinoma, cT3N1M0, stage IIIA, ER+/PR+/HER2-, ypT1cN1aM0 after neoadjuvant chemotherapy -I reviewed her surgical pathology findings with her and her daughter in great details. She had a partial response to neoadjuvant chemotherapy, tumor size has significantly decreased, however she still has 1 positive axilla node. -She had a reexcision for positive margins, final path was negative for malignant cells. - we again discussed the risk of cancer recurrence after her surgery,  Due to the locally advanced stage, persistent node-positive disease after neoadjuvant chemotherapy, she certainly has high risk for recurrence. --Given the strong ER and PR positivity, I recommend adjuvant aromatase inhibitor for 10 years to reduce her risk of cancer recurrence,  The potential benefit and side effects, which includes but not limited to, hot flash, skin and vaginal dryness, metabolic changes ( increased blood glucose, cholesterol, weight, etc.), slightly in increased risk of cardiovascular disease, cataracts, muscular and joint discomfort, osteopenia and osteoporosis, etc, were discussed with her in great details. She agrees to proceed. She is recovering well from radiation, will start letrozole in 1 week. I called into her pharmacy today. - we reviewed the the clinical trial options again today. [facility] clinical trial counseling: Patients who have completed definitive therapy for breast cancer are randomized to antiestrogen therapy (5+ years)  versus antiestrogen therapy plus Palbociclib (2 years). She had reviewed the consent after last visit, is not very interested in participating due to the concerns of side effects, I answered her questions, and she will make a decision soon.  -We reviewed breast cancer surveillance plan. She'll continue annual mammogram, follow-up regularly with lab and exam, every 3-4 months for the first 2-3 years, then every 6-12 months afterwards.  2. Cancer genetics She has very strong family history of breast, colon and pancreatic cancer, all in first-degree, I recommend her to see genetic counselor to ruled out inheritable breast cancer syndrome.  -her genetic testing result was  normal  3. History of non-STEMI in 03/2014 -She had cardiac catheterization, which showed mild diffuse coronary artery disease, but no need intervention.  -She will follow-up with her cardiologist Dr. Burt Knack -We discussed some of the chemotherapy drug may cause heart failure, we'll repeat her echo after chemo, she may need follow-up with Dr. Burt Knack during her chemotherapy.   4. Anemia secondary to chemo -Anemia improved since she completed chemo -We'll continue monitoring  6. Peripheral neuropathy, G1 -Secondary to chemotherapy. Continue to improve lately. - she will continue Neurontin   Plan -I called in letrozole to her from that day, she will start in 1 week. -She will make a final decision about the [place] clinical trial, and call us  -I will see her back in 6 weeks, sooner if she wants to participate the trial   All questions were answered. The patient knows to call the clinic with any problems, questions or concerns. I spent 20 minutes counseling the patient face to face. The total time spent in the appointment was 25 minutes and more than 50% was on counseling.     Truitt Merle, MD 07/05/2015

## 2015-07-08 ENCOUNTER — Encounter (HOSPITAL_COMMUNITY): Payer: Self-pay

## 2015-07-16 ENCOUNTER — Other Ambulatory Visit: Payer: Self-pay | Admitting: *Deleted

## 2015-07-16 MED ORDER — ATORVASTATIN CALCIUM 40 MG PO TABS: 40.0000 mg | ORAL_TABLET | Freq: Every day | ORAL | Status: DC

## 2015-07-16 MED ORDER — POTASSIUM CHLORIDE ER 10 MEQ PO TBCR: 10.0000 meq | EXTENDED_RELEASE_TABLET | Freq: Every evening | ORAL | Status: DC

## 2015-07-19 ENCOUNTER — Other Ambulatory Visit: Payer: Self-pay | Admitting: *Deleted

## 2015-07-19 MED ORDER — ATORVASTATIN CALCIUM 40 MG PO TABS: 40.0000 mg | ORAL_TABLET | Freq: Every day | ORAL | Status: DC

## 2015-07-19 MED ORDER — POTASSIUM CHLORIDE ER 10 MEQ PO TBCR: 10.0000 meq | EXTENDED_RELEASE_TABLET | Freq: Every evening | ORAL | Status: DC

## 2015-07-21 ENCOUNTER — Encounter: Payer: Self-pay | Admitting: Radiation Oncology

## 2015-07-23 ENCOUNTER — Other Ambulatory Visit: Payer: Self-pay | Admitting: *Deleted

## 2015-07-23 DIAGNOSIS — C50212 Malignant neoplasm of upper-inner quadrant of left female breast: Secondary | ICD-10-CM

## 2015-07-26 ENCOUNTER — Encounter: Payer: Self-pay | Admitting: Cardiovascular Disease

## 2015-07-26 ENCOUNTER — Ambulatory Visit (INDEPENDENT_AMBULATORY_CARE_PROVIDER_SITE_OTHER): Payer: Federal, State, Local not specified - PPO | Admitting: Cardiovascular Disease

## 2015-07-26 ENCOUNTER — Telehealth: Payer: Self-pay | Admitting: Adult Health

## 2015-07-26 VITALS — BP 128/82 | HR 88 | Ht 67.0 in | Wt 180.4 lb

## 2015-07-26 DIAGNOSIS — I201 Angina pectoris with documented spasm: Secondary | ICD-10-CM

## 2015-07-26 NOTE — Progress Notes (Signed)
Cardiology Office Note Date:  07/28/2015   ID:  Mary Randall, DOB [birth year], MRN [id number]  PCP:  Mary Obey, MD  Cardiologist:  Mary Mocha, MD    Chief Complaint  Patient presents with  . Follow-up     History of Present Illness: Mary Randall is a 60 y.o. female who presents for Follow-up evaluation. She has a history of Prinzmetal angina. She underwent cardiac catheterization in 2016 demonstrating mild diffuse nonobstructive CAD with normal LV function and normal LVEDP. At the time of her presentation her EKG was markedly abnormal. Coronary vasospasm was considered as a potential etiology of her symptoms. She was treated with isosorbide and amlodipine as well as aspirin and atorvastatin.  The patient is doing great. She has completed chemotherapy and radiation for treatment of breast cancer. She's feeling much better since treatment has been completed. From a cardiac standpoint she has no complaints. Today, she denies symptoms of palpitations, chest pain, shortness of breath, orthopnea, PND, lower extremity edema, dizziness, or syncope.  Past Medical History:  Diagnosis Date  . Angina effort ([place])    none recent  . Arthritis    "right knee" (03/16/2015)  . Breast cancer, left ([place]) dx'd 2016  . 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 Family history of breast cancer   . Family history of breast cancer in female   . Family history of pancreatic cancer   . Hx of radiation therapy 05/17/15- 06/28/15   Left Breast  . Hypertension   . NSTEMI (non-ST elevated myocardial infarction) ([place]) 03/16/2014   non-obs CAD, spasm seen  . Pneumonia 2009    Past Surgical History:  Procedure Laterality Date  . BREAST BIOPSY Left 08/06/14  . BREAST LUMPECTOMY WITH NEEDLE LOCALIZATION AND AXILLARY LYMPH NODE DISSECTION Left 03/15/2015   Procedure: LEFT BREAST LUMPECTOMY WITH BRACKETED NEEDLE LOCALIZATION AND AXILLARY LYMPH NODE DISSECTION;  Surgeon: Autumn Messing III, MD;   Location: [facility];  Service: General;  Laterality: Left;  . BUNIONECTOMY Bilateral   . CARDIAC CATHETERIZATION  2010; 03/16/2014  . ECTOPIC PREGNANCY SURGERY  1991   Tubal pregnancy  . LEFT HEART CATHETERIZATION WITH CORONARY ANGIOGRAM N/A 03/16/2014   Procedure: LEFT HEART CATHETERIZATION WITH CORONARY ANGIOGRAM;  Surgeon: Mary Mocha, MD; Non-obs CAD, EF 55%  . PORT-A-CATH REMOVAL Right 03/15/2015   Procedure: REMOVAL PORT-A-CATH;  Surgeon: Autumn Messing III, MD;  Location: [facility];  Service: General;  Laterality: Right;  . PORTACATH PLACEMENT N/A 08/28/2014   Procedure: INSERTION PORT-A-CATH;  Surgeon: Autumn Messing III, MD;  Location: [facility];  Service: General;  Laterality: N/A;  . RE-EXCISION OF BREAST CANCER,SUPERIOR MARGINS Left 04/21/2015   Procedure: RE-EXCISION OF BREAST CANCER, INFERIOR MARGIN;  Surgeon: Autumn Messing III, MD;  Location: [facility];  Service: General;  Laterality: Left;  . TUBAL LIGATION  1981    Current Outpatient Prescriptions  Medication Sig Dispense Refill  . amLODipine (NORVASC) 10 MG tablet Take 10 mg by mouth daily.    . Ascorbic Acid (VITAMIN C ADULT GUMMIES PO) Take 2 tablets by mouth daily.    Marland Kitchen atorvastatin (LIPITOR) 40 MG tablet Take 1 tablet (40 mg total) by mouth daily at 6 PM. 90 tablet 0  . emollient (RADIAGEL) gel Apply topically as needed for wound care.    Marland Kitchen HYDROcodone-acetaminophen (NORCO) 5-325 MG tablet Take 1-2 tablets by mouth every 6 (six) hours as needed. 30 tablet 0  . irbesartan (AVAPRO) 300 MG tablet Take 300 mg by  mouth daily.    Marland Kitchen letrozole (FEMARA) 2.5 MG tablet Take 1 tablet (2.5 mg total) by mouth daily. 30 tablet 2  . nitroGLYCERIN (NITROSTAT) 0.4 MG SL tablet Place 1 tablet (0.4 mg total) under the tongue every 5 (five) minutes as needed for chest pain. 25 tablet 12  . non-metallic deodorant (ALRA) MISC Apply 1 application topically daily as needed.    . ondansetron (ZOFRAN ODT) 4 MG disintegrating tablet Take 1 tablet (4 mg total) by mouth every  8 (eight) hours as needed for nausea or vomiting. 20 tablet 0  . ondansetron (ZOFRAN) 8 MG tablet Take 1 tablet (8 mg total) by mouth 2 (two) times daily as needed for nausea or vomiting. Start on 3rd day after Chemotherapy 30 tablet 1  . potassium chloride (K-DUR) 10 MEQ tablet Take 1 tablet (10 mEq total) by mouth every evening. 90 tablet 0   No current facility-administered medications for this visit.     Allergies:   Carvedilol; Codeine; Percocet [oxycodone-acetaminophen]; Adhesive [tape]; and Compazine [prochlorperazine]   Social History:  The patient  reports that she has never smoked. She has never used smokeless tobacco. She reports that she does not drink alcohol or use drugs.   Family History:  The patient's  family history includes CVA in her father; Cancer (age of onset: 25) in her brother; Cancer (age of onset: 83) in her mother; Diabetes type II in her mother; Healthy in her sister and sister; Hypertension in her mother; Pancreatic cancer (age of onset: 27) in her brother.    ROS:  Please see the history of present illness.  All other systems are reviewed and negative.    PHYSICAL EXAM: VS:  BP 128/82   Pulse 88   Ht 5\' 7"  (1.702 m)   Wt 180 lb 6.4 oz (81.8 kg)   BMI 28.25 kg/m  , BMI Body mass index is 28.25 kg/m. GEN: Well nourished, well developed, in no acute distress  HEENT: normal  Neck: no JVD, no masses. No carotid bruits Cardiac: RRR without murmur or gallop                Respiratory:  clear to auscultation bilaterally, normal work of breathing GI: soft, nontender, nondistended, + BS MS: no deformity or atrophy  Ext: no pretibial edema, pedal pulses 2+= bilaterally Skin: warm and dry, no rash Neuro:  Strength and sensation are intact Psych: euthymic mood, full affect  EKG:  EKG is not ordered today.  Recent Labs: 10/14/2014: Magnesium 1.9 07/05/2015: ALT 28; BUN 12.7; Creatinine 1.0; HGB 11.7; Platelets 181; Potassium 3.9; Sodium 143   Lipid Panel       Component Value Date/Time   CHOL 144 03/17/2014 0402   TRIG 136 03/17/2014 0402   HDL 39 (L) 03/17/2014 0402   CHOLHDL 3.7 03/17/2014 0402   VLDL 27 03/17/2014 0402   LDLCALC 78 03/17/2014 0402      Wt Readings from Last 3 Encounters:  07/26/15 180 lb 6.4 oz (81.8 kg)  07/05/15 180 lb 9.6 oz (81.9 kg)  06/28/15 179 lb 12.8 oz (81.6 kg)     Cardiac Studies Reviewed: Echo 08-24-2014: Study Conclusions  - Left ventricle: The cavity size was normal. Wall thickness was   normal. Systolic function was normal. The estimated ejection   fraction was in the range of 55% to 60%. - Left atrium: The atrium was mildly dilated.  Cath 03-16-2014: Hemodynamics: AO 113/70 with a mean of 91 LV 117/12  Coronary angiography:  Coronary dominance: right  Left mainstem: The left main stem is widely patent. The vessel has minor irregularity and divides into the LAD and left circumflex. There is no stenosis.  Left anterior descending (LAD): The LAD has very minimal stenosis at the ostium. The diagonal branches are patent. The first diagonal has minor irregularity. The second diagonal has 50% ostial stenosis. The proximal LAD has no significant disease. The mid LAD after the second diagonal has diffuse 40-50% stenosis. The LAD wraps around the inferoapex.  Left circumflex (LCx): The left circumflex is large in caliber. The vessel has no significant obstruction through the proximal and mid segments. There is minor 20% stenosis in the mid vessel. The obtuse marginal branches are patent with mild nonobstructive disease noted.  Right coronary artery (RCA): This is a large, dominant vessel. The PDA and PLA branches are patent. There is no stenosis throughout the RCA distribution.  Left ventriculography: Left ventricular systolic function is normal, LVEF is estimated at 60-65%, there is no significant mitral regurgitation   Contrast: 85 cc Omnipaque  Estimated Blood Loss: minimal  Final  Conclusions:   1. 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 Normal LV systolic function with normal LVEDP  Recommendations: Somewhat unclear as to the etiology of this patient's fairly profound symptoms and EKG changes. 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 The patient had previously been on long-acting nitrates and ran out of medication recently. It is also possible that she has some sort of GI pathology but difficult to explain her EKG changes. We'll start back on long-acting nitrate therapy. The patient should have serial cardiac enzymes and overnight observation in the hospital.  ASSESSMENT AND PLAN: Prinzmetal Angina: no symptoms at present. Continue amlodipine. Isosorbide no longer on her medication list and as long as she's not having issues ok to stay off of nitrate Rx.   Hyperlipidemia: treated with atorvastatin. Followed by PCP.   Current medicines are reviewed with the patient today.  The patient does not have concerns regarding medicines.  Labs/ tests ordered today include:  No orders of the defined types were placed in this encounter.   Disposition:   FU one year  Signed, Mary Mocha, MD  07/28/2015 10:47 PM    [address], [place], [place]  [address] Phone: [phone]; Fax: [phone]

## 2015-07-26 NOTE — Patient Instructions (Signed)
Medication Instructions:  Your physician recommends that you continue on your current medications as directed. Please refer to the Current Medication list given to you today.   Labwork: None ordered  Testing/Procedures: None ordered  Follow-Up: Your physician wants you to follow-up in: 1 year with Dr. Cooper. You will receive a reminder letter in the mail two months in advance. If you don't receive a letter, please call our office to schedule the follow-up appointment.   Any Other Special Instructions Will Be Listed Below (If Applicable).     If you need a refill on your cardiac medications before your next appointment, please call your pharmacy.   

## 2015-07-26 NOTE — Telephone Encounter (Signed)
Appointment confirmed with patient. Mary Randall °

## 2015-07-30 ENCOUNTER — Ambulatory Visit
Admission: RE | Admit: 2015-07-30 | Discharge: 2015-07-30 | Disposition: A | Discharge status: Home / Self Care | Payer: Federal, State, Local not specified - PPO | Source: Ambulatory Visit | Attending: Radiation Oncology | Admitting: Radiation Oncology

## 2015-07-30 ENCOUNTER — Encounter: Payer: Self-pay | Admitting: Radiation Oncology

## 2015-07-30 DIAGNOSIS — Z170 Estrogen receptor positive status [ER+]: Secondary | ICD-10-CM | POA: Diagnosis not present

## 2015-07-30 DIAGNOSIS — Z79811 Long term (current) use of aromatase inhibitors: Secondary | ICD-10-CM | POA: Diagnosis not present

## 2015-07-30 DIAGNOSIS — C50212 Malignant neoplasm of upper-inner quadrant of left female breast: Secondary | ICD-10-CM | POA: Insufficient documentation

## 2015-07-30 HISTORY — DX: Personal history of irradiation: Z92.3

## 2015-07-30 NOTE — Progress Notes (Signed)
Radiation Oncology         [phone] ________________________________  Name: Mary Randall MRN: [id number]  Date: 07/30/2015  DOB: 1955/06/10  Follow-Up Visit Note  Outpatient  CC: Suzanna Obey, MD  Jovita Kussmaul, MD  Diagnosis and Prior Radiotherapy:    ICD-9-CM ICD-10-CM   1. 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 She denies pain or fatigue. She reports the skin to her left breast has healed well. She is still using the Radiaplex cream daily and will switch to a Vitamin E lotion when she finishes her current tube. She has been taking Letrozole since the second week in July. She denies any side effects.  ALLERGIES:  is allergic to carvedilol; codeine; percocet [oxycodone-acetaminophen]; adhesive [tape]; and compazine [prochlorperazine].  Meds: Current Outpatient Prescriptions  Medication Sig Dispense Refill  . amLODipine (NORVASC) 10 MG tablet Take 10 mg by mouth daily.    . Ascorbic Acid (VITAMIN C ADULT GUMMIES PO) Take 2 tablets by mouth daily.    Marland Kitchen atorvastatin (LIPITOR) 40 MG tablet Take 1 tablet (40 mg total) by mouth daily at 6 PM. 90 tablet 0  . emollient (RADIAGEL) gel Apply topically as needed for wound care.    . irbesartan (AVAPRO) 300 MG tablet Take 300 mg by mouth daily.    Marland Kitchen letrozole (FEMARA) 2.5 MG tablet Take 1 tablet (2.5 mg total) by mouth daily. 30 tablet 2  . nitroGLYCERIN (NITROSTAT) 0.4 MG SL tablet Place 1 tablet (0.4 mg total) under the tongue every 5 (five) minutes as needed for chest pain. 25 tablet 12  . non-metallic deodorant (ALRA) MISC Apply 1 application topically daily as needed.    . potassium chloride (K-DUR) 10 MEQ tablet Take  1 tablet (10 mEq total) by mouth every evening. 90 tablet 0  . HYDROcodone-acetaminophen (NORCO) 5-325 MG tablet Take 1-2 tablets by mouth every 6 (six) hours as needed. (Patient not taking: Reported on 07/30/2015) 30 tablet 0  . ondansetron (ZOFRAN ODT) 4 MG disintegrating tablet Take 1 tablet (4 mg total) by mouth every 8 (eight) hours as needed for nausea or vomiting. (Patient not taking: Reported on 07/30/2015) 20 tablet 0  . ondansetron (ZOFRAN) 8 MG tablet Take 1 tablet (8 mg total) by mouth 2 (two) times daily as needed for nausea or vomiting. Start on 3rd day after Chemotherapy (Patient not taking: Reported on 07/30/2015) 30 tablet 1   No current facility-administered medications for this encounter.     Physical Findings: The patient is in no acute distress. Patient is alert and oriented.  height is 5' 7" (1.702 m) and weight is 181 lb 9.6 oz (82.4 kg). Her temperature is 98.1 F (36.7 C). Her blood pressure is 118/84 and her pulse is 76. .     Feint residual hyperpigmentation over the left breast. Skin intact.  Lab Findings: Lab Results  Component Value Date   WBC 3.1 (L) 07/05/2015   HGB 11.7 07/05/2015   HCT 36.4 07/05/2015   MCV 86.6 07/05/2015   PLT 181 07/05/2015    Radiographic Findings: No results found.  Impression/Plan: She has healed well from radiotherapy. Continue skin care with vitamin E  lotion. Patient encouraged to enroll in the [facility].  I encouraged her to continue with yearly mammography and followup with medical oncology. I will see her back on an as-needed basis. I have encouraged her to call if she has any issues or concerns in the future. I wished her the very best.   _____________________________________   Eppie Gibson, MD  This document serves as a record of services personally performed by Eppie Gibson, MD. It was created on her behalf by Darcus Austin, a trained medical scribe. The creation of this record is based on the scribe's personal  observations and the provider's statements to them. This document has been checked and approved by the attending provider.

## 2015-07-30 NOTE — Progress Notes (Signed)
Mary Randall is here for follow up of radiation completed 06/28/15 to her Left Breast. She denies or fatigue. She reports the skin to her Left Breast has healed well. She is still using the Radiaplex cream daily, and will switch to a Vitamin E cream when she finishes her current tube. She has been taking Letrozole since the second week in July. She denies any side effects.  BP 118/84   Pulse 76   Temp 98.1 F (36.7 C)   Ht 5\' 7"  (1.702 m)   Wt 181 lb 9.6 oz (82.4 kg)   BMI 28.44 kg/m

## 2015-08-11 ENCOUNTER — Other Ambulatory Visit: Payer: Self-pay | Admitting: Cardiovascular Disease

## 2015-08-12 ENCOUNTER — Encounter (HOSPITAL_COMMUNITY): Payer: Self-pay

## 2015-08-12 ENCOUNTER — Emergency Department (HOSPITAL_COMMUNITY): Payer: Federal, State, Local not specified - PPO

## 2015-08-12 ENCOUNTER — Emergency Department (HOSPITAL_COMMUNITY)
Admission: EM | Admit: 2015-08-12 | Discharge: 2015-08-12 | Disposition: A | Discharge status: Home / Self Care | Payer: Federal, State, Local not specified - PPO | Attending: Emergency Medicine | Admitting: Emergency Medicine

## 2015-08-12 DIAGNOSIS — Z853 Personal history of malignant neoplasm of breast: Secondary | ICD-10-CM | POA: Diagnosis not present

## 2015-08-12 DIAGNOSIS — I10 Essential (primary) hypertension: Secondary | ICD-10-CM | POA: Diagnosis not present

## 2015-08-12 DIAGNOSIS — I252 Old myocardial infarction: Secondary | ICD-10-CM | POA: Diagnosis not present

## 2015-08-12 DIAGNOSIS — N12 Tubulo-interstitial nephritis, not specified as acute or chronic: Secondary | ICD-10-CM | POA: Insufficient documentation

## 2015-08-12 DIAGNOSIS — N10 Acute tubulo-interstitial nephritis: Secondary | ICD-10-CM | POA: Diagnosis not present

## 2015-08-12 DIAGNOSIS — R109 Unspecified abdominal pain: Secondary | ICD-10-CM | POA: Diagnosis not present

## 2015-08-12 DIAGNOSIS — Z79899 Other long term (current) drug therapy: Secondary | ICD-10-CM | POA: Diagnosis not present

## 2015-08-12 LAB — BASIC METABOLIC PANEL
Anion gap: 7 (ref 5–15)
BUN: 7 mg/dL (ref 6–20)
CO2: 29 mmol/L (ref 22–32)
CREATININE: 0.96 mg/dL (ref 0.44–1.00)
Calcium: 10.7 mg/dL — ABNORMAL HIGH (ref 8.9–10.3)
Chloride: 103 mmol/L (ref 101–111)
GFR calc Af Amer: >60 mL/min (ref >60)
GFR calc non Af Amer: >60 mL/min (ref >60)
GLUCOSE: 135 mg/dL — AB (ref 65–99)
POTASSIUM: 4.1 mmol/L (ref 3.5–5.1)
Sodium: 139 mmol/L (ref 135–145)

## 2015-08-12 LAB — URINALYSIS, ROUTINE W REFLEX MICROSCOPIC
BILIRUBIN URINE: NEGATIVE
Glucose, UA: NEGATIVE mg/dL
HGB URINE DIPSTICK: NEGATIVE
KETONES UR: NEGATIVE mg/dL
NITRITE: POSITIVE — AB
PH: 8 (ref 5.0–8.0)
Protein, ur: NEGATIVE mg/dL
Specific Gravity, Urine: 1.01 (ref 1.005–1.030)

## 2015-08-12 LAB — CBC WITH DIFFERENTIAL/PLATELET
Basophils Absolute: 0 10*3/uL (ref 0.0–0.1)
Basophils Relative: 0 %
EOS ABS: 0 10*3/uL (ref 0.0–0.7)
Eosinophils Relative: 1 %
HEMATOCRIT: 37.1 % (ref 36.0–46.0)
HEMOGLOBIN: 12.1 g/dL (ref 12.0–15.0)
LYMPHS ABS: 1 10*3/uL (ref 0.7–4.0)
LYMPHS PCT: 26 %
MCH: 29.3 pg (ref 26.0–34.0)
MCHC: 32.6 g/dL (ref 30.0–36.0)
MCV: 89.8 fL (ref 78.0–100.0)
MONOS PCT: 7 %
Monocytes Absolute: 0.3 10*3/uL (ref 0.1–1.0)
NEUTROS PCT: 66 %
Neutro Abs: 2.7 10*3/uL (ref 1.7–7.7)
Platelets: 199 10*3/uL (ref 150–400)
RBC: 4.13 MIL/uL (ref 3.87–5.11)
RDW: 14.6 % (ref 11.5–15.5)
WBC: 4 10*3/uL (ref 4.0–10.5)

## 2015-08-12 LAB — URINE MICROSCOPIC-ADD ON: RBC / HPF: NONE SEEN RBC/hpf (ref 0–5)

## 2015-08-12 LAB — CK: Total CK: 70 U/L (ref 38–234)

## 2015-08-12 MED ORDER — KETOROLAC TROMETHAMINE 30 MG/ML IJ SOLN
30.0000 mg | Freq: Once | INTRAMUSCULAR | Status: AC
  Administered 2015-08-12: 30 mg via INTRAVENOUS
  Filled 2015-08-12: qty 1

## 2015-08-12 MED ORDER — ONDANSETRON HCL 4 MG/2ML IJ SOLN
4.0000 mg | Freq: Once | INTRAMUSCULAR | Status: AC
  Administered 2015-08-12: 4 mg via INTRAVENOUS
  Filled 2015-08-12: qty 2

## 2015-08-12 MED ORDER — SODIUM CHLORIDE 0.9 % IV BOLUS (SEPSIS)
1000.0000 mL | Freq: Once | INTRAVENOUS | Status: AC
Start: 2015-08-12 — End: 2015-08-12
  Administered 2015-08-12: 1000 mL via INTRAVENOUS

## 2015-08-12 MED ORDER — DEXTROSE 5 % IV SOLN
1.0000 g | Freq: Once | INTRAVENOUS | Status: AC
  Administered 2015-08-12: 1 g via INTRAVENOUS
  Filled 2015-08-12: qty 10

## 2015-08-12 MED ORDER — CEPHALEXIN 500 MG PO CAPS: 500.0000 mg | ORAL_CAPSULE | Freq: Four times a day (QID) | ORAL | 0 refills | Status: DC

## 2015-08-12 MED ORDER — ONDANSETRON HCL 4 MG PO TABS: 4.0000 mg | ORAL_TABLET | Freq: Four times a day (QID) | ORAL | 0 refills | Status: DC

## 2015-08-12 NOTE — ED Provider Notes (Signed)
[place] DEPT Provider Note   CSN: [id number] Arrival date & time: 08/12/15  1515  First Provider Contact:  First MD Initiated Contact with Patient 08/12/15 1857        History   Chief Complaint Chief Complaint  Patient presents with  . Flank Pain    HPI Mary Randall is a 60 y.o. female.  Patient with right-sided lower back pain for the past 3 days for this gradually worsening. Intensity waxes and wanes in severity but never goes away completely. Denies any nausea, vomiting, fever. Pain is worse with movement. Denies any falls or injury. Denies any dysuria hematuria. Eyes any vaginal bleeding or discharge. Denies any diarrhea. Denies any abdominal pain or chest pain. Denies any history of back problems. No focal weakness, numbness, tingling, bowel or bladder incontinence. Remote history of breast cancer in remission. Has chronic paresthesias in her arms and legs from this.   The history is provided by the patient.  Flank Pain  Pertinent negatives include no chest pain, no abdominal pain, no headaches and no shortness of breath.    Past Medical History:  Diagnosis Date  . Angina effort ([place])    none recent  . Arthritis    "right knee" (03/16/2015)  . Breast cancer, left ([place]) dx'd 2016  . 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 Family history of breast cancer   . Family history of breast cancer in female   . Family history of pancreatic cancer   . Hx of radiation therapy 05/17/15- 06/28/15   Left Breast  . Hypertension   . NSTEMI (non-ST elevated myocardial infarction) ([place]) 03/16/2014   non-obs CAD, spasm seen  . Pneumonia 2009    Patient Active Problem List   Diagnosis Date Noted  . Breast cancer, female, left 03/15/2015  . UTI (urinary tract infection) 12/02/2014  . Chemotherapy induced nausea and vomiting 11/03/2014  . Anemia due to antineoplastic chemotherapy 11/03/2014  . Genetic testing 10/13/2014  . Family history of breast  cancer   . Family history of pancreatic cancer   . Family history of breast cancer in female   . Breast cancer of upper-inner quadrant of left female breast ([place]) 08/16/2014  . BP (high blood pressure) 04/17/2014  . NSTEMI (non-ST elevated myocardial infarction) ([place]) 03/16/2014  . 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 Chest pain 09/04/2012  . Essential hypertension, benign 09/04/2012  . Angina pectoris with documented spasm ([place]) 03/05/2012  . Coronary artery spasm ([place]) 03/05/2012  . Arthritis of knee, degenerative 12/04/2011    Past Surgical History:  Procedure Laterality Date  . BREAST BIOPSY Left 08/06/14  . BREAST LUMPECTOMY WITH NEEDLE LOCALIZATION AND AXILLARY LYMPH NODE DISSECTION Left 03/15/2015   Procedure: LEFT BREAST LUMPECTOMY WITH BRACKETED NEEDLE LOCALIZATION AND AXILLARY LYMPH NODE DISSECTION;  Surgeon: Autumn Messing III, MD;  Location: [facility];  Service: General;  Laterality: Left;  . BUNIONECTOMY Bilateral   . CARDIAC CATHETERIZATION  2010; 03/16/2014  . ECTOPIC PREGNANCY SURGERY  1991   Tubal pregnancy  . LEFT HEART CATHETERIZATION WITH CORONARY ANGIOGRAM N/A 03/16/2014   Procedure: LEFT HEART CATHETERIZATION WITH CORONARY ANGIOGRAM;  Surgeon: Sherren Mocha, MD; Non-obs CAD, EF 55%  . PORT-A-CATH REMOVAL Right 03/15/2015   Procedure: REMOVAL PORT-A-CATH;  Surgeon: Autumn Messing III, MD;  Location: [facility];  Service: General;  Laterality: Right;  . PORTACATH PLACEMENT N/A 08/28/2014   Procedure: INSERTION PORT-A-CATH;  Surgeon: Autumn Messing III, MD;  Location: [facility];  Service: General;  Laterality: N/A;  . RE-EXCISION OF BREAST CANCER,SUPERIOR MARGINS Left 04/21/2015   Procedure: RE-EXCISION OF BREAST CANCER, INFERIOR MARGIN;  Surgeon: Autumn Messing III, MD;  Location: [facility];  Service: General;  Laterality: Left;  . TUBAL LIGATION  1981    OB History    No data available       Home Medications    Prior to Admission medications   Medication Sig Start Date End Date Taking? Authorizing  Provider  amLODipine (NORVASC) 10 MG tablet Take 10 mg by mouth daily.    Historical Provider, MD  Ascorbic Acid (VITAMIN C ADULT GUMMIES PO) Take 2 tablets by mouth daily.    Historical Provider, MD  atorvastatin (LIPITOR) 40 MG tablet Take 1 tablet (40 mg total) by mouth daily at 6 PM. 07/19/15   Sherren Mocha, MD  emollient (RADIAGEL) gel Apply topically as needed for wound care.    Historical Provider, MD  HYDROcodone-acetaminophen (NORCO) 5-325 MG tablet Take 1-2 tablets by mouth every 6 (six) hours as needed. Patient not taking: Reported on 07/30/2015 04/21/15   Autumn Messing III, MD  irbesartan (AVAPRO) 300 MG tablet Take 300 mg by mouth daily.    Historical Provider, MD  irbesartan (AVAPRO) 300 MG tablet take 1 tablet by mouth once daily 08/12/15   Sherren Mocha, MD  letrozole [facility]) 2.5 MG tablet Take 1 tablet (2.5 mg total) by mouth daily. 07/05/15   Truitt Merle, MD  nitroGLYCERIN (NITROSTAT) 0.4 MG SL tablet Place 1 tablet (0.4 mg total) under the tongue every 5 (five) minutes as needed for chest pain. 03/17/14   Evelene Croon Barrett, PA-C  non-metallic deodorant [facility]) MISC Apply 1 application topically daily as needed.    Historical Provider, MD  ondansetron (ZOFRAN ODT) 4 MG disintegrating tablet Take 1 tablet (4 mg total) by mouth every 8 (eight) hours as needed for nausea or vomiting. Patient not taking: Reported on 07/30/2015 04/17/15   Gareth Morgan, MD  ondansetron (ZOFRAN) 8 MG tablet Take 1 tablet (8 mg total) by mouth 2 (two) times daily as needed for nausea or vomiting. Start on 3rd day after Chemotherapy Patient not taking: Reported on 07/30/2015 09/02/14   Truitt Merle, MD  potassium chloride (K-DUR) 10 MEQ tablet Take 1 tablet (10 mEq total) by mouth every evening. 07/19/15   Sherren Mocha, MD    Family History Family History  Problem Relation Age of Onset  . Pancreatic cancer Brother 60  . Cancer Brother 40    breast cancer   . Cancer Mother 9    colon cancer   . Hypertension  Mother   . Diabetes type II Mother   . CVA Father   . Healthy Sister   . Healthy Sister     Social History Social History  Substance Use Topics  . Smoking status: Never Smoker  . Smokeless tobacco: Never Used  . Alcohol use No     Allergies   Carvedilol; Codeine; Percocet [oxycodone-acetaminophen]; Adhesive [tape]; and Compazine [prochlorperazine]   Review of Systems Review of Systems  Constitutional: Negative for activity change, appetite change and fever.  HENT: Negative for congestion and rhinorrhea.   Eyes: Negative for visual disturbance.  Respiratory: Negative for cough, chest tightness and shortness of breath.   Cardiovascular: Negative for chest pain.  Gastrointestinal: Negative for abdominal pain, nausea and vomiting.  Genitourinary: Positive for flank pain. Negative for decreased urine volume, dysuria, hematuria, vaginal bleeding and vaginal discharge.  Musculoskeletal: Negative for arthralgias, back pain and myalgias.  Skin: Negative for rash.  Neurological: Negative for dizziness, weakness and headaches.   A complete 10 system review of systems was obtained and all systems are negative except as noted in the HPI and PMH.    Physical Exam Updated Vital Signs BP 132/92 (BP Location: Right Arm)   Pulse 91   Temp 98.4 F (36.9 C) (Oral)   Resp 18   SpO2 98%   Physical Exam  Constitutional: She is oriented to person, place, and time. She appears well-developed and well-nourished. No distress.  HENT:  Head: Normocephalic and atraumatic.  Mouth/Throat: Oropharynx is clear and moist. No oropharyngeal exudate.  Eyes: Conjunctivae and EOM are normal. Pupils are equal, round, and reactive to light.  Neck: Normal range of motion. Neck supple.  No meningismus.  Cardiovascular: Normal rate, regular rhythm, normal heart sounds and intact distal pulses.   No murmur heard. Pulmonary/Chest: Effort normal and breath sounds normal. No respiratory distress.  Abdominal:  Soft. There is no tenderness. There is no rebound and no guarding.  Musculoskeletal: Normal range of motion. She exhibits tenderness. She exhibits no edema.  R paraspinal CVAT. No midline tenderness  5/5 strength in bilateral lower extremities. Ankle plantar and dorsiflexion intact. Great toe extension intact bilaterally. +2 DP and PT pulses. +2 patellar reflexes bilaterally. Normal gait.   Neurological: She is alert and oriented to person, place, and time. No cranial nerve deficit. She exhibits normal muscle tone. Coordination normal.   5/5 strength throughout. CN 2-12 intact.Equal grip strength.   Skin: Skin is warm.  Psychiatric: She has a normal mood and affect. Her behavior is normal.  Nursing note and vitals reviewed.    ED Treatments / Results  Labs (all labs ordered are listed, but only abnormal results are displayed) Labs Reviewed  URINALYSIS, ROUTINE W REFLEX MICROSCOPIC (NOT AT [facility]) - Abnormal; Notable for the following:       Result Value   Nitrite POSITIVE (*)    Leukocytes, UA SMALL (*)    All other components within normal limits  URINE MICROSCOPIC-ADD ON - Abnormal; Notable for the following:    Squamous Epithelial / LPF 0-5 (*)    Bacteria, UA MANY (*)    All other components within normal limits  BASIC METABOLIC PANEL - Abnormal; Notable for the following:    Glucose, Bld 135 (*)    Calcium 10.7 (*)    All other components within normal limits  URINE CULTURE  CBC WITH DIFFERENTIAL/PLATELET  CK    EKG  EKG Interpretation None       Radiology Ct Renal Stone Study  Result Date: 08/12/2015 CLINICAL DATA:  Initial evaluation for acute left flank pain. EXAM: CT ABDOMEN AND PELVIS WITHOUT CONTRAST TECHNIQUE: Multidetector CT imaging of the abdomen and pelvis was performed following the standard protocol without IV contrast. COMPARISON:  Prior CT from 11/01/2014. FINDINGS: Mild scattered atelectatic changes present within the visualized lung bases. Visualized  lungs are otherwise clear. Trace pericardial effusion noted. There is heterogeneous asymmetric soft tissue density with scattered metallic densities within the visualize left breast. Overlying skin thickening. Finding may be related to recent breast surgery. Minimal hazy stranding extending down the lateral left abdominal wall may be postsurgical as well. Liver demonstrates a normal unenhanced appearance. Gallbladder within normal limits. No biliary dilatation. Spleen and adrenal glands within normal limits. Scattered fatty atrophy of the pancreas. Pancreas otherwise unremarkable. Kidneys are equal in size without evidence for nephrolithiasis or hydronephrosis. No radiopaque calculi seen along the course  of either renal collecting system. There is no hydroureter. Subcentimeter hypodense lesion at the upper pole the right kidney noted, not well evaluated on this noncontrast examination, but may reflect a small cyst. Small hiatal hernia noted. Stomach otherwise unremarkable. No evidence for bowel obstruction. Appendix normal. No abnormal wall thickening or inflammatory fat stranding seen about the bowels. Bladder partially distended with prominent circumferential wall thickening, which may related to incomplete distension or possibly acute cystitis. No layering stones within the bladder lumen. Uterus and and ovaries within normal limits. No free intraperitoneal air. No free fluid. Small fat containing paraumbilical hernia noted. No pathologically enlarged intra-abdominal or pelvic lymph nodes. Mild aorto bi-iliac atherosclerotic disease. No aneurysm. No acute osseous abnormality. No worrisome lytic or blastic osseous lesions. IMPRESSION: 1. No CT evidence for nephrolithiasis or obstructive uropathy. 2. Circumferential bladder wall thickening. While this finding may be related to incomplete distension, possible acute cystitis could also have this appearance. Correlation with urinalysis recommended. 3. No other acute  intra-abdominal or pelvic process. 4. Asymmetric soft tissue is stranding with skin thickening within the partially visualized left breast. Finding suspected to be related to recent breast surgery. Correlation with history recommended. Further evaluation with dedicated mammography recommended as warranted. 5. Small hiatal hernia. Electronically Signed   By: Jeannine Boga M.D.   On: 08/12/2015 21:20    Procedures Procedures (including critical care time)  Medications Ordered in ED Medications  ondansetron (ZOFRAN) injection 4 mg (not administered)  ketorolac (TORADOL) 30 MG/ML injection 30 mg (not administered)  sodium chloride 0.9 % bolus 1,000 mL (not administered)  cefTRIAXone (ROCEPHIN) 1 g in dextrose 5 % 50 mL IVPB (not administered)     Initial Impression / Assessment and Plan / ED Course  I have reviewed the triage vital signs and the nursing notes.  Pertinent labs & imaging results that were available during my care of the patient were reviewed by me and considered in my medical decision making (see chart for details).  Clinical Course  Flank pain x3 days without urinary symptoms. No fever or vomiting. No history of kidney stones.  Urinalysis consistent with infection, white cells, positive nitrate and bacteria. We'll start Rocephin and obtain urine culture. Check CT to rule out infected kidney stone.  CT shows no kidney stone or other pathology. That showed thickened bladder wall consistent with her cystitis. Patient informed of abnormal breast tissue on the left. She states she is scheduled for a mammogram later this month. She had a lumpectomy on the side of body.  We'll treat for pyelonephritis, urine culture sent. Return precautions discussed.  Final Clinical Impressions(s) / ED Diagnoses   Final diagnoses:  None    New Prescriptions New Prescriptions   No medications on file     Ezequiel Essex, MD 08/12/15 2351

## 2015-08-12 NOTE — Discharge Instructions (Signed)
Take the antibiotics as prescribed. Followup with your doctor for a mammogram. Return to the ED if you develop worsening pain, fever, vomiting, or any other concerns.

## 2015-08-12 NOTE — ED Notes (Signed)
Pt stable, ambulatory, states understanding of discharge instructions 

## 2015-08-12 NOTE — ED Triage Notes (Signed)
Patient here with right flank pain x 4 days, denies trauma but complains of dark urine. States pain is increased with movement.

## 2015-08-13 ENCOUNTER — Other Ambulatory Visit: Payer: Self-pay | Admitting: Hematology

## 2015-08-13 DIAGNOSIS — Z853 Personal history of malignant neoplasm of breast: Secondary | ICD-10-CM

## 2015-08-15 LAB — URINE CULTURE: Culture: >=100000 — AB

## 2015-08-16 ENCOUNTER — Ambulatory Visit (HOSPITAL_BASED_OUTPATIENT_CLINIC_OR_DEPARTMENT_OTHER): Payer: Federal, State, Local not specified - PPO | Admitting: Hematology

## 2015-08-16 ENCOUNTER — Other Ambulatory Visit (HOSPITAL_BASED_OUTPATIENT_CLINIC_OR_DEPARTMENT_OTHER): Payer: Federal, State, Local not specified - PPO

## 2015-08-16 ENCOUNTER — Telehealth (HOSPITAL_BASED_OUTPATIENT_CLINIC_OR_DEPARTMENT_OTHER): Payer: Self-pay | Admitting: Emergency Medicine

## 2015-08-16 ENCOUNTER — Encounter: Payer: Self-pay | Admitting: Hematology

## 2015-08-16 ENCOUNTER — Other Ambulatory Visit: Payer: Self-pay | Admitting: Hematology

## 2015-08-16 ENCOUNTER — Telehealth: Payer: Self-pay | Admitting: Hematology

## 2015-08-16 VITALS — BP 140/93 | HR 76 | Temp 99.2°F | Resp 18 | Ht 67.0 in | Wt 179.3 lb

## 2015-08-16 DIAGNOSIS — G609 Hereditary and idiopathic neuropathy, unspecified: Secondary | ICD-10-CM | POA: Diagnosis not present

## 2015-08-16 DIAGNOSIS — C50212 Malignant neoplasm of upper-inner quadrant of left female breast: Secondary | ICD-10-CM | POA: Diagnosis not present

## 2015-08-16 DIAGNOSIS — Z780 Asymptomatic menopausal state: Secondary | ICD-10-CM

## 2015-08-16 DIAGNOSIS — T451X5A Adverse effect of antineoplastic and immunosuppressive drugs, initial encounter: Secondary | ICD-10-CM

## 2015-08-16 DIAGNOSIS — C773 Secondary and unspecified malignant neoplasm of axilla and upper limb lymph nodes: Secondary | ICD-10-CM

## 2015-08-16 DIAGNOSIS — D6481 Anemia due to antineoplastic chemotherapy: Secondary | ICD-10-CM

## 2015-08-16 DIAGNOSIS — I10 Essential (primary) hypertension: Secondary | ICD-10-CM

## 2015-08-16 DIAGNOSIS — N390 Urinary tract infection, site not specified: Secondary | ICD-10-CM

## 2015-08-16 LAB — CBC WITH DIFFERENTIAL/PLATELET
BASO%: 0.5 % (ref 0.0–2.0)
Basophils Absolute: 0 10*3/uL (ref 0.0–0.1)
EOS%: 1.9 % (ref 0.0–7.0)
Eosinophils Absolute: 0.1 10*3/uL (ref 0.0–0.5)
HEMATOCRIT: 38.4 % (ref 34.8–46.6)
HEMOGLOBIN: 12.4 g/dL (ref 11.6–15.9)
LYMPH#: 1 10*3/uL (ref 0.9–3.3)
LYMPH%: 30.3 % (ref 14.0–49.7)
MCH: 28.8 pg (ref 25.1–34.0)
MCHC: 32.4 g/dL (ref 31.5–36.0)
MCV: 88.9 fL (ref 79.5–101.0)
MONO#: 0.4 10*3/uL (ref 0.1–0.9)
MONO%: 13.1 % (ref 0.0–14.0)
NEUT#: 1.7 10*3/uL (ref 1.5–6.5)
NEUT%: 54.2 % (ref 38.4–76.8)
PLATELETS: 202 10*3/uL (ref 145–400)
RBC: 4.32 10*6/uL (ref 3.70–5.45)
RDW: 15.8 % — AB (ref 11.2–14.5)
WBC: 3.2 10*3/uL — ABNORMAL LOW (ref 3.9–10.3)

## 2015-08-16 LAB — COMPREHENSIVE METABOLIC PANEL
ALBUMIN: 3.8 g/dL (ref 3.5–5.0)
ALK PHOS: 102 U/L (ref 40–150)
ALT: 24 U/L (ref 0–55)
ANION GAP: 9 meq/L (ref 3–11)
AST: 21 U/L (ref 5–34)
BILIRUBIN TOTAL: 0.72 mg/dL (ref 0.20–1.20)
BUN: 6.9 mg/dL — AB (ref 7.0–26.0)
CALCIUM: 11.1 mg/dL — AB (ref 8.4–10.4)
CHLORIDE: 106 meq/L (ref 98–109)
CO2: 28 mEq/L (ref 22–29)
CREATININE: 1 mg/dL (ref 0.6–1.1)
EGFR: 73 mL/min/{1.73_m2} — ABNORMAL LOW (ref >90)
Glucose: 93 mg/dl (ref 70–140)
Potassium: 3.9 mEq/L (ref 3.5–5.1)
Sodium: 143 mEq/L (ref 136–145)
TOTAL PROTEIN: 7.4 g/dL (ref 6.4–8.3)

## 2015-08-16 MED ORDER — CIPROFLOXACIN HCL 500 MG PO TABS: 500.0000 mg | ORAL_TABLET | Freq: Two times a day (BID) | ORAL | 0 refills | Status: DC

## 2015-08-16 NOTE — Telephone Encounter (Signed)
Post ED Visit - Positive Culture Follow-up  Culture report reviewed by antimicrobial stewardship pharmacist:  []  Elenor Quinones, Pharm.D. []  Heide Guile, Pharm.D., BCPS []  Parks Neptune, Pharm.D. []  Alycia Rossetti, Pharm.D., BCPS []  Adrian, Pharm.D., BCPS, AAHIVP []  Legrand Como, Pharm.D., BCPS, AAHIVP []  Milus Glazier, Pharm.D. []  Stephens November, [place].D.  Positive urine culture Treated with cephalexin, organism sensitive to the same and no further patient follow-up is required at this time.  Hazle Nordmann 08/16/2015, 10:07 AM

## 2015-08-16 NOTE — Progress Notes (Signed)
[facility]  Telephone:([phone]) [phone] Fax:(336) [phone]  Clinic follow up Note   Patient Care Team: Macy Mis, MD as PCP - General (Family Medicine) Chevis Pretty III, MD as Consulting Physician (General Surgery) Tonny Bollman, MD as Consulting Physician (Cardiology)  08/16/2015  CHIEF COMPLAINTS:  Follow up left breast cancer   Oncology History   Breast cancer of upper-inner quadrant of left female breast [facility])   Staging form: Breast, AJCC 7th Edition     Clinical: Stage IIIA (T3, N1, M0) - Unsigned     Pathologic stage from 03/15/2015: Stage IIA (yT1c, N1a, cM0) - Signed by Malachy Mood, MD on 04/02/2015       Breast cancer of upper-inner quadrant of left female breast (HCC)   08/06/2014 Initial Diagnosis    Breast cancer of upper-inner quadrant of left female breast     08/06/2014 Initial Biopsy    Left breast 9:30 o'clock position showed invasive ductal carcinoma, and 1 left axillary lymph node biopsy showed positive for metastatic carcinoma.     08/06/2014 Receptors her2    ER 100% positive, PR 5% positive, HER2-, Ki-67 50%     08/06/2014 Mammogram    Diagnostic mammogram and ultrasound showed left breast mass with associated calcification measuring 5.5 cm, indeterminate a  left axillary lymph node was cortical thickening, circumscribed low density oval 65mm mass at 12:00 within the left breast.     09/03/2014 - 01/26/2015 Adjuvant Chemotherapy    ddAC every 2 weeks X4, followed by weekly Taxol X12      03/15/2015 Surgery    left lumpectomy and axillary node dissection      03/15/2015 Pathology Results    left lumpectomy showed G2 invasive ductal carcinoma, 2cm, G2, and DCIS, G2. the lateral margin was positive for invasive tumor, 1 out of 15 nodes was positive      04/21/2015 Surgery    Re-excision for positive margin. Surgical path was negative for malignancy.     05/17/2015 - 06/28/2015 Radiation Therapy    adjuvant breast radiation       HISTORY OF PRESENTING  ILLNESS:  Mary Randall 60 y.o. female is here because of recently newly diagnosed left breast cancer. She presents to the clinic with her daughter.  This was discovered by screening mammogram. Her prior mammogram was 2 years ago. She noticed mild left nipple retraction, no palpable mass, no other changes. She feels well overall, denies any significant pain except mild right knee pain from arthritis. She denies any change of her appetite, or recent weight loss. She works for post office, Games developer, and is physically active.   Her family history is notable for breast cancer in her brother, pancreatic cancer in another brother, and colon cancer in her mother. She is postmenopausal, last menstrual period 8 years ago. She has a daughter and a son. She lives with her brother, and her daughter is likely going to stay with her.  CURRENT TREATMENT:  Letrozole 2.5 mg once daily, started on 07/12/2015  INTERIM HISTORY Mary Randall returns for follow up. She developed significant right flank pain about a week ago, no significant dysuria or urinary frequency, no fever. She went to emergency room 4 days ago for the worsening right flank pain, CT scan was negative for stones, UA was positive for UTI. She was given a course of Keflex. She states her pain has not improved much. No other new symptoms.   She has been on letrozole for one month now, has  been tolerating well overall. Mild hot flash, manageable. No significant muscular or joint discomfort. Her energy level and appetite has improved some lately, no other new complaints. Her numbness and tingling of her fingers and toes has improved some. Her hand function has been back to normal now.  MEDICAL HISTORY:  Past Medical History:  Diagnosis Date  . Angina effort ([place])    none recent  . Arthritis    "right knee" (03/16/2015)  . Breast cancer, left ([place]) dx'd 2016  . 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 Family history of breast  cancer   . Family history of breast cancer in female   . Family history of pancreatic cancer   . Hx of radiation therapy 05/17/15- 06/28/15   Left Breast  . Hypertension   . NSTEMI (non-ST elevated myocardial infarction) ([place]) 03/16/2014   non-obs CAD, spasm seen  . Pneumonia 2009    SURGICAL HISTORY: Past Surgical History:  Procedure Laterality Date  . BREAST BIOPSY Left 08/06/14  . BREAST LUMPECTOMY WITH NEEDLE LOCALIZATION AND AXILLARY LYMPH NODE DISSECTION Left 03/15/2015   Procedure: LEFT BREAST LUMPECTOMY WITH BRACKETED NEEDLE LOCALIZATION AND AXILLARY LYMPH NODE DISSECTION;  Surgeon: Autumn Messing III, MD;  Location: [place];  Service: General;  Laterality: Left;  . BUNIONECTOMY Bilateral   . CARDIAC CATHETERIZATION  2010; 03/16/2014  . ECTOPIC PREGNANCY SURGERY  1991   Tubal pregnancy  . LEFT HEART CATHETERIZATION WITH CORONARY ANGIOGRAM N/A 03/16/2014   Procedure: LEFT HEART CATHETERIZATION WITH CORONARY ANGIOGRAM;  Surgeon: Sherren Mocha, MD; Non-obs CAD, EF 55%  . PORT-A-CATH REMOVAL Right 03/15/2015   Procedure: REMOVAL PORT-A-CATH;  Surgeon: Autumn Messing III, MD;  Location: [place];  Service: General;  Laterality: Right;  . PORTACATH PLACEMENT N/A 08/28/2014   Procedure: INSERTION PORT-A-CATH;  Surgeon: Autumn Messing III, MD;  Location: [place];  Service: General;  Laterality: N/A;  . RE-EXCISION OF BREAST CANCER,SUPERIOR MARGINS Left 04/21/2015   Procedure: RE-EXCISION OF BREAST CANCER, INFERIOR MARGIN;  Surgeon: Autumn Messing III, MD;  Location: [facility];  Service: General;  Laterality: Left;  . TUBAL LIGATION  1981   GYN HISTORY  Menarchal: 15 LMP: 51 Contraceptive: 4 HRT: no G2P2: no breast feeding     SOCIAL HISTORY: Social History   Social History  . Marital Status: Legally Separated    Spouse Name: N/A  . Number of Children: Daughter 59, son 70   . Years of Education: N/A   Occupational History  . Works for post office    Social History Main Topics  . Smoking status: Never Smoker    . Smokeless tobacco: Never Used  . Alcohol Use: No  . Drug Use: No  . Sexual Activity: No   Other Topics Concern  . Not on file   Social History Narrative    FAMILY HISTORY: Family History  Problem Relation Age of Onset  . Pancreatic cancer Brother 25  . Cancer Brother 40    breast cancer   . Cancer Mother 92    colon cancer   . Hypertension Mother   . Diabetes type II Mother   . CVA Father   . Healthy Sister   . Healthy Sister     ALLERGIES:  is allergic to carvedilol; codeine; percocet [oxycodone-acetaminophen]; adhesive [tape]; and compazine [prochlorperazine].  MEDICATIONS:  Current Outpatient Prescriptions  Medication Sig Dispense Refill  . amLODipine (NORVASC) 10 MG tablet Take 10 mg by mouth daily.    . Ascorbic Acid (VITAMIN  C ADULT GUMMIES PO) Take 2 tablets by mouth daily.    Marland Kitchen atorvastatin (LIPITOR) 40 MG tablet Take 1 tablet (40 mg total) by mouth daily at 6 PM. 90 tablet 0  . cephALEXin (KEFLEX) 500 MG capsule Take 1 capsule (500 mg total) by mouth 4 (four) times daily. 40 capsule 0  . irbesartan (AVAPRO) 300 MG tablet take 1 tablet by mouth once daily 30 tablet 11  . letrozole (FEMARA) 2.5 MG tablet Take 1 tablet (2.5 mg total) by mouth daily. 30 tablet 2  . potassium chloride (K-DUR) 10 MEQ tablet Take 1 tablet (10 mEq total) by mouth every evening. 90 tablet 0  . ciprofloxacin (CIPRO) 500 MG tablet Take 1 tablet (500 mg total) by mouth 2 (two) times daily. 14 tablet 0  . emollient (RADIAGEL) gel Apply topically as needed for wound care.    Marland Kitchen HYDROcodone-acetaminophen (NORCO) 5-325 MG tablet Take 1-2 tablets by mouth every 6 (six) hours as needed. (Patient not taking: Reported on 07/30/2015) 30 tablet 0  . nitroGLYCERIN (NITROSTAT) 0.4 MG SL tablet Place 1 tablet (0.4 mg total) under the tongue every 5 (five) minutes as needed for chest pain. (Patient not taking: Reported on 08/16/2015) 25 tablet 12  . non-metallic deodorant (ALRA) MISC Apply 1 application  topically daily as needed.    . ondansetron (ZOFRAN ODT) 4 MG disintegrating tablet Take 1 tablet (4 mg total) by mouth every 8 (eight) hours as needed for nausea or vomiting. (Patient not taking: Reported on 07/30/2015) 20 tablet 0  . ondansetron (ZOFRAN) 4 MG tablet Take 1 tablet (4 mg total) by mouth every 6 (six) hours. (Patient not taking: Reported on 08/16/2015) 12 tablet 0   No current facility-administered medications for this visit.     REVIEW OF SYSTEMS:   Constitutional: Denies fevers, chills or abnormal night sweats Eyes: Denies blurriness of vision, double vision or watery eyes Ears, nose, mouth, throat, and face: Denies mucositis or sore throat Respiratory: Denies cough, dyspnea or wheezes Cardiovascular: Denies palpitation, chest discomfort or lower extremity swelling Gastrointestinal:  Denies nausea, heartburn or change in bowel habits Skin: Denies abnormal skin rashes Lymphatics: Denies new lymphadenopathy or easy bruising Neurological:Denies numbness, tingling or new weaknesses Behavioral/Psych: Mood is stable, no new changes  All other systems were reviewed with the patient and are negative.  PHYSICAL EXAMINATION: ECOG PERFORMANCE STATUS: 2  Vitals:   08/16/15 0951  BP: (!) 140/93  Pulse: 76  Resp: 18  Temp: 99.2 F (37.3 C)   Filed Weights   08/16/15 0951  Weight: 179 lb 4.8 oz (81.3 kg)    GENERAL:alert, no distress and comfortable SKIN: skin color, texture, turgor are normal, no rashes or significant lesions, (+) skin pigmentation nails, no significant nail thickening  EYES: normal, conjunctiva are pink and non-injected, sclera clear OROPHARYNX:no exudate, no erythema and lips, buccal mucosa, and tongue normal  NECK: supple, thyroid normal size, non-tender, without nodularity LYMPH:  no palpable lymphadenopathy in the cervical, axillary or inguinal LUNGS: clear to auscultation and percussion with normal breathing effort HEART: regular rate & rhythm and  no murmurs and no lower extremity edema ABDOMEN:abdomen soft, non-tender and normal bowel sounds Musculoskeletal:no cyanosis of digits and no clubbing, (+) right flank tenderness PSYCH: alert & oriented x 3 with fluent speech NEURO: no focal motor/sensory deficits Breasts: Breast inspection showed them to be symmetrical with no nipple discharge.  The surgical incision in the left breast and axilla are well healed, Moderate skin pigmentation from radiation,  no skin ulcer or peeling. Exam of both breasts and axilla revealed no evidence of mass or adenopathy.   LABORATORY DATA:  I have reviewed the data as listed CBC Latest Ref Rng & Units 08/16/2015 08/12/2015 07/05/2015  WBC 3.9 - 10.3 10e3/uL 3.2(L) 4.0 3.1(L)  Hemoglobin 11.6 - 15.9 g/dL 12.4 12.1 11.7  Hematocrit 34.8 - 46.6 % 38.4 37.1 36.4  Platelets 145 - 400 10e3/uL 202 199 181    CMP Latest Ref Rng & Units 08/16/2015 08/12/2015 07/05/2015  Glucose 70 - 140 mg/dl 93 135(H) 96  BUN 7.0 - 26.0 mg/dL 6.9(L) 7 12.7  Creatinine 0.6 - 1.1 mg/dL 1.0 0.96 1.0  Sodium 136 - 145 mEq/L 143 139 143  Potassium 3.5 - 5.1 mEq/L 3.9 4.1 3.9  Chloride 101 - 111 mmol/L - 103 -  CO2 22 - 29 mEq/L '28 29 27  '$ Calcium 8.4 - 10.4 mg/dL 11.1(H) 10.7(H) 10.5(H)  Total Protein 6.4 - 8.3 g/dL 7.4 - 6.9  Total Bilirubin 0.20 - 1.20 mg/dL 0.72 - 0.53  Alkaline Phos 40 - 150 U/L 102 - 102  AST 5 - 34 U/L 21 - 22  ALT 0 - 55 U/L 24 - 28   Urine culture  Order: [id number]  Status:  Final result Visible to patient:  No (Not Released) Next appt:  08/18/2015 at 10:20 AM in Radiology ([facility])   4d ago  Specimen Description URINE, RANDOM  Special Requests NONE  Culture >=100,000 COLONIES/mL KLEBSIELLA PNEUMONIAE   Report Status 08/15/2015 FINAL  Organism ID, Bacteria KLEBSIELLA PNEUMONIAE   Resulting Agency [facility]  Susceptibility    Klebsiella pneumoniae   MIC  AMPICILLIN >=32 RESIST... Resistant  AMPICILLIN/SULBACTAM 4 SENSITIVE  Sensitive   CEFAZOLIN <=4 SENSITIVE "><=4 SENSITIVE  Sensitive  CEFTRIAXONE <=1 SENSITIVE "><=1 SENSITIVE  Sensitive  CIPROFLOXACIN <=0.25 SENSITIVE "><=0.25 SENS... Sensitive  Extended ESBL NEGATIVE  Sensitive  GENTAMICIN <=1 SENSITIVE "><=1 SENSITIVE  Sensitive  IMIPENEM <=0.25 SENSITIVE "><=0.25 SENS... Sensitive  NITROFURANTOIN 64 INTERMED... Intermediate  PIP/TAZO <=4 SENSITIVE "><=4 SENSITIVE  Sensitive  TRIMETH/SULFA <=20 SENSITIVE "><=20 SENSIT... Sensitive              PATHOLOGY REPORT: Diagnosis 03/15/2015 1. 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 - INTERMEDIATE GRADE DUCTAL CARCINOMA IN SITU. - DUCTAL CARCINOMA IN SITU IS FOCALLY 0.1 TO 0.2 CM TO SUPERIOR MARGIN. - DUCTAL CARCINOMA IN SITU IS FOCALLY 0.2 CM TO LATERAL MARGIN. - INVASIVE DUCTAL CARCINOMA INVOLVES LATERAL MARGIN. - PLEASE CORRELATE WITH LOCATION OF SPECIMENS NUMBER 2 (ADDITIONAL SUPERIOR MARGIN) AND NUMBER 3 (ADDITIONAL LATERAL MARGIN) FOR FINAL MARGIN STATUS. - INVASIVE DUCTAL CARCINOMA FOCALLY INVOLVES INFERIOR MARGIN. - OTHER MARGINS ARE NEGATIVE. - SEE ONCOLOGY TEMPLATE. 2. Breast, excision, Left additional superior margin - FOCAL INTERMEDIATE DUCTAL CARCINOMA IN SITU. - MARGIN IS NEGATIVE (TUMOR IS GREATER THAN 0.4 CM TO MARGIN). 3. Breast, excision, Left additional lateral margin - BENIGN BREAST PARENCHYMA WITH FIBROCYSTIC CHANGES. - NO TUMOR SEEN. 4. 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 Microscopic Comment 1. BREAST, INVASIVE TUMOR, WITH LYMPH NODES PRESENT Specimen, including laterality and lymph node sampling (sentinel, non-sentinel): Left partial breast with left additional superior margin and left additional lateral margin; left non-sentinel lymph nodes. Procedure: Left breast lumpectomy with additional left superior margin and additional left  lateral margin excisions; left axillary lymph node dissection. Histologic type: Invasive ductal carcinoma. Grade:  Tubule formation: 3. Nuclear pleomorphism: 2. Mitotic:1. Tumor size (gross measurement): 2 cm. Margins: Invasive, distance to closest margin: Invasive ductal carcinoma involves lateral margin on initial lumpectomy specimen but additional lateral margin (specimen number 3) is negative for invasive ductal carcinoma; invasive ductal carcinoma focally involves inferior margin. In-situ, distance to closest margin: Ductal carcinoma in situ is focally 0.1 to 0.2 cm to superior margin on initial lumpectomy specimen but is at least 0.4 cm away from additional superior margin (see specimen #4).. If margin positive, focally or broadly: Initial breast lumpectomy demonstrates fairly broad involvement of the lateral margin; however, the additional lateral margin excision is negative for invasive ductal carcinoma; invasive ductal carcinoma focally involves the inferior margin. Lymphovascular invasion: Definitive lymphovascular invasion is not identified; however, a lymph node is positive for metastatic ductal carcinoma, see below. Ductal carcinoma in situ: Yes. Grade: Intermediate grade. Extensive intraductal component: No. Lobular neoplasia: Not identified. Tumor focality: Unifocal. Treatment effect: Some degree of treatment effect is present in the breast tissue. If present, treatment effect in breast tissue, lymph nodes or both: Treatment effect is only seen in breast tissue. Extent of tumor: Tumor present in breast tissue. Skin: Not received. Nipple: Not received. Skeletal muscle: Not received. Lymph nodes: Examined: 0 Sentinel. 15 Non-sentinel. 15 Total. Lymph nodes with metastasis: 1. Isolated tumor cells (< 0.2 mm): 0.  Microscopic Comment(continued) Micrometastasis: (> 0.2 mm and < 2.0 mm): 0. Macrometastasis: (> 2.0 mm): 1. Extracapsular extension: Not  identified. Breast prognostic profile: Performed on previous case, [phone] Estrogen receptor: 100%, positive. Progesterone receptor: 5%, positive. Her-2 neu: 1.47 ratio, negative. Ki-67: 50%. Non-neoplastic breast: Fibrocystic changes. TNM: ypT1c, ypN1a. Comments: As Her-2 neu was previously negative, this will be repeated on representative current tumor and reported in an addendum to follow. Dr. Lyndon Code has seen selected slides (1A, 1G and 2I) in consultation with agreement that invasive ductal carcinoma involves the lateral margin and focally involves the inferior margin and that ductal carcinoma in situ is focally 0.1 to 0.2 cm to superior margin. He is also in agreement that the findings in slide 2I represent focal ductal carcinoma in situ. (RH:ecj 03/17/2015)  Results: HER2 - NEGATIVE RATIO OF HER2/CEP17 SIGNALS 1.62 AVERAGE HER2 COPY NUMBER PER CELL 2.35  Diagnosis 04/21/2015 Breast, excision, left inferior margin BREAST TISSUE WITH PREVIOUS RESECTION SITE CHANGES NEGATIVE FOR MALIGNANCY   RADIOGRAPHIC STUDIES: I have personally reviewed the radiological images as listed and agreed with the findings in the report.  Echo 08/24/2014 Study Conclusions  - Left ventricle: The cavity size was normal. Wall thickness was normal. Systolic function was normal. The estimated ejection fraction was in the range of 55% to 60%. - Left atrium: The atrium was mildly dilated.  MR breast b/l w wo contrast 02/01/2015  IMPRESSION: Interval decrease in size of retroareolar mass within the left    breast with decreased conspicuity of previously described non mass enhancement extending from the posterior margin of the mass.  Interval decrease in cortical thickening of previously described left axillary lymph nodes.  RECOMMENDATION: Treatment plan for left breast malignancy.    ASSESSMENT & PLAN:  60 year old African-American female, postmenopausal, healthy and fit, who was  found to have a left breast mass and a biopsy confirmed node metastasis by screening mammogram.  1. Left breast invasive ductal carcinoma, cT3N1M0, stage IIIA, ER+/PR+/HER2-, ypT1cN1aM0 after neoadjuvant chemotherapy -I reviewed her surgical pathology findings with her and her daughter in great details. She had a partial response to neoadjuvant chemotherapy, tumor size  has significantly decreased, however she still has 1 positive axilla node. -She had a reexcision for positive margins, final path was negative for malignant cells. - we again discussed the risk of cancer recurrence after her surgery,  Due to the locally advanced stage, persistent node-positive disease after neoadjuvant chemotherapy, she certainly has high risk for recurrence. --She has started adjuvant letrozole, tolerating well so far. We'll continue for 10 years if she is able tolerate -will continue breast cancer surveillance plan. She'll continue annual mammogram, follow-up regularly with lab and exam, every 3-4 months for the first 2-3 years, then every 6-12 months afterwards.  2. Klebsiella UTI -Her urine culture was positive for Klebsiella, resistant to ampicillin, but sensitive to other antibiotics such as Cipro. -Her right flank pain has not improved since he started Keflex. I recommend her to switch to Cipro, I called in to her pharmacy today.  3. Bone health -She has not had bone density scan for any years -I recommend her to have one as a baseline, we discussed that letrozole may weak her bone -We'll monitor her bone density scan every 2 years -I encouraged her to take calcium and vitamin D  4. Cancer genetics She has very strong family history of breast, colon and pancreatic cancer, all in first-degree, I recommend her to see genetic counselor to ruled out inheritable breast cancer syndrome.  -her genetic testing result was normal  5. History of non-STEMI in 03/2014 -She had cardiac catheterization, which showed  mild diffuse coronary artery disease, but no need intervention.  -She will follow-up with her cardiologist Dr. Burt Knack -We discussed some of the chemotherapy drug may cause heart failure, we'll repeat her echo after chemo, she may need follow-up with Dr. Burt Knack during her chemotherapy.   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 Continue to improve lately. - she will continue Neurontin   Plan -She will stop Keflex, and start Cipro 500 mg twice daily for 7 days  -Return to clinic in 3 months with lab, -Bone DEXA scan within the next month  All questions were answered. The patient knows to call the clinic with any problems, questions or concerns. I spent 20 minutes counseling the patient face to face. The total time spent in the appointment was 25 minutes and more than 50% was on counseling.     Truitt Merle, MD 08/16/2015

## 2015-08-16 NOTE — Telephone Encounter (Signed)
GAVE PATIENT AVS REPORT AND APPOINTMENTS FOR September AND November, INCLUDING MAMMO AND BONE DENSITY AT [facility] 8/17. DUE TO BONE DENSITY TEST ADDED - MAMMO MOVED TO 8/17 WITH BONE DENSITY - PATIENT AWARE AND HAS NEW SCHEDULE.

## 2015-08-17 ENCOUNTER — Encounter: Payer: Self-pay | Admitting: Hematology

## 2015-08-18 ENCOUNTER — Telehealth: Payer: Self-pay

## 2015-08-18 ENCOUNTER — Other Ambulatory Visit (INDEPENDENT_AMBULATORY_CARE_PROVIDER_SITE_OTHER): Payer: Self-pay | Admitting: General Surgery

## 2015-08-18 DIAGNOSIS — Z853 Personal history of malignant neoplasm of breast: Secondary | ICD-10-CM

## 2015-08-18 NOTE — Telephone Encounter (Signed)
Pt has appt tomorrow at [facility] [place] for diagnostic MM. Please sign orders in epic or fax signed orders to [phone].

## 2015-08-19 ENCOUNTER — Ambulatory Visit
Admission: RE | Admit: 2015-08-19 | Discharge: 2015-08-19 | Disposition: A | Discharge status: Home / Self Care | Payer: Federal, State, Local not specified - PPO | Source: Ambulatory Visit | Attending: Hematology | Admitting: Hematology

## 2015-08-19 DIAGNOSIS — Z780 Asymptomatic menopausal state: Secondary | ICD-10-CM | POA: Diagnosis not present

## 2015-08-19 DIAGNOSIS — Z853 Personal history of malignant neoplasm of breast: Secondary | ICD-10-CM

## 2015-08-19 DIAGNOSIS — Z13820 Encounter for screening for osteoporosis: Secondary | ICD-10-CM | POA: Diagnosis not present

## 2015-08-20 DIAGNOSIS — Z0000 Encounter for general adult medical examination without abnormal findings: Secondary | ICD-10-CM | POA: Diagnosis not present

## 2015-08-23 ENCOUNTER — Other Ambulatory Visit: Payer: Self-pay | Admitting: Hematology

## 2015-08-23 DIAGNOSIS — C50212 Malignant neoplasm of upper-inner quadrant of left female breast: Secondary | ICD-10-CM

## 2015-08-24 ENCOUNTER — Ambulatory Visit
Admission: RE | Admit: 2015-08-24 | Discharge: 2015-08-24 | Disposition: A | Discharge status: Home / Self Care | Payer: Federal, State, Local not specified - PPO | Source: Ambulatory Visit | Attending: General Surgery | Admitting: General Surgery

## 2015-08-24 DIAGNOSIS — Z800 Family history of malignant neoplasm of digestive organs: Secondary | ICD-10-CM | POA: Diagnosis not present

## 2015-08-24 DIAGNOSIS — K5900 Constipation, unspecified: Secondary | ICD-10-CM | POA: Diagnosis not present

## 2015-08-24 DIAGNOSIS — Z1211 Encounter for screening for malignant neoplasm of colon: Secondary | ICD-10-CM | POA: Diagnosis not present

## 2015-08-24 DIAGNOSIS — R922 Inconclusive mammogram: Secondary | ICD-10-CM | POA: Diagnosis not present

## 2015-09-01 NOTE — Progress Notes (Signed)
1. Breast cancer of upper-inner quadrant of left female breast ([place]) 174.2 C50.212   Photon Boost Complex Simulation and Treatment Planning Note  Diagnosis: Breast Cancer  The patient's CT images from her free-breathing simulation were reviewed to plan her boost treatment to her Left breast  lumpectomy cavity.  The boost to the lumpectomy cavity will be delivered with 4 photon fields using MLCs for custom blocks again heart and lungs, with 6 and 10 MV photon energy.  This constitutes 4 complex treatment devices. Isodose plan was reviewed and approved. 10 Gy in 5 fractions prescribed.  -----------------------------------  Eppie Gibson, MD

## 2015-09-09 ENCOUNTER — Other Ambulatory Visit: Payer: Self-pay | Admitting: *Deleted

## 2015-09-09 NOTE — Telephone Encounter (Signed)
Patient called and requested a refill on hctz. At last office visit this medication was removed from the patients list with a reason of completed course. She stated that she was not aware the she was not supposed to be taking it. Please advise. Thanks, MI

## 2015-09-09 NOTE — Telephone Encounter (Addendum)
Please refer to Dr York Cerise nurse, Ander Purpura, as I was not working with Dr Burt Knack on the day of the pts last OV with him. Thanks.

## 2015-09-13 MED ORDER — HYDROCHLOROTHIAZIDE 25 MG PO TABS: 25.0000 mg | ORAL_TABLET | Freq: Every day | ORAL | 3 refills | Status: DC

## 2015-09-13 NOTE — Telephone Encounter (Signed)
Chart reviewed. It wasn't on her medication list when I saw her in July. However, if she has been taking it, should be continued as her BP Was well-controlled.

## 2015-09-13 NOTE — Telephone Encounter (Signed)
Refill sent to the pharmacy 

## 2015-09-16 ENCOUNTER — Telehealth: Payer: Self-pay | Admitting: *Deleted

## 2015-09-16 ENCOUNTER — Encounter: Payer: Self-pay | Admitting: Hematology

## 2015-09-16 NOTE — Telephone Encounter (Signed)
Pt req. Letter for return to work & also had a form request for temporary light duty.  Dr Burr Medico signed & pt will pick up both tomorrow am.

## 2015-09-22 ENCOUNTER — Encounter: Payer: Federal, State, Local not specified - PPO | Admitting: Adult Health

## 2015-10-18 ENCOUNTER — Telehealth: Payer: Self-pay | Admitting: Medical Oncology

## 2015-10-18 NOTE — Telephone Encounter (Signed)
Issues with return to work form. Transferred call to [place] and gave fax number to pt to fax form.

## 2015-10-19 ENCOUNTER — Other Ambulatory Visit: Payer: Self-pay | Admitting: Cardiovascular Disease

## 2015-10-20 ENCOUNTER — Telehealth: Payer: Self-pay

## 2015-10-20 NOTE — Telephone Encounter (Signed)
Disability forms for [employer] filled out and pt picked up on 10/19/15

## 2015-11-14 NOTE — Progress Notes (Signed)
[place]  Telephone:([phone]) [phone] Fax:(336) [phone]  Clinic follow up Note   Patient Care Team: Katherina Mires, MD as PCP - General (Family Medicine) Autumn Messing III, MD as Consulting Physician (General Surgery) Sherren Mocha, MD as Consulting Physician (Cardiology)  11/15/2015  CHIEF COMPLAINTS:  Follow up left breast cancer   Oncology History   Breast cancer of upper-inner quadrant of left female breast [facility])   Staging form: Breast, AJCC 7th Edition     Clinical: Stage IIIA (T3, N1, M0) - Unsigned     Pathologic stage from 03/15/2015: Stage IIA (yT1c, N1a, cM0) - Signed by Truitt Merle, MD on 04/02/2015       Breast cancer of upper-inner quadrant of left female breast ([place])   08/06/2014 Initial Diagnosis    Breast cancer of upper-inner quadrant of left female breast      08/06/2014 Initial Biopsy    Left breast 9:30 o'clock position showed invasive ductal carcinoma, and 1 left axillary lymph node biopsy showed positive for metastatic carcinoma.      08/06/2014 Receptors her2    ER 100% positive, PR 5% positive, HER2-, Ki-67 50%      08/06/2014 Mammogram    Diagnostic mammogram and ultrasound showed left breast mass with associated calcification measuring 5.5 cm, indeterminate a  left axillary lymph node was cortical thickening, circumscribed low density oval 59m mass at 12:00 within the left breast.      09/03/2014 - 01/26/2015 Neo-Adjuvant Chemotherapy    ddAC every 2 weeks X4, followed by weekly Taxol X12       09/23/2014 Procedure    Genetic testing: Negative. Genes evaluated: ATM, BARD1, BRCA1, BRCA2, BRIP1, CDH1, CHEK2, EPCAM, FANCC, MLH1, MSH2, MSH6, NBN, PALB2, PMS2, PTEN, RAD51C, RAD51D, TP53, & XRCC2.       03/15/2015 Surgery    left lumpectomy and axillary node dissection (Marlou Starks      03/15/2015 Pathology Results    left lumpectomy showed G2 invasive ductal carcinoma, 2cm, G2, and DCIS, G2. the lateral margin was positive for invasive tumor, 1 out of 15  nodes was positive.     ypT1c,ypN1a: Stage IIA       04/21/2015 Surgery    Re-excision for positive margin. Surgical path was negative for malignancy.      05/17/2015 - 06/28/2015 Radiation Therapy    adjuvant breast radiation (Isidore Moos. Left breast treated to 50 Gy in 25 fractions.  Left SCLV and PAB treated to 45 Gy in 25 fractions.  Left breast boost treated to 10 Gy in 5 fractions       07/12/2015 -  Anti-estrogen oral therapy    Letrozole 2.5 mg once daily. Planned duration of therapy: 10 years.       08/19/2015 Imaging    DEXA scan: Normal. (T-score -1.0)      08/24/2015 Mammogram    Diagnostic mammogram: No evidence for malignancy.        HISTORY OF PRESENTING ILLNESS:  Mary [id number].o. female is here because of recently newly diagnosed left breast cancer. She presents to the clinic with her daughter.  This was discovered by screening mammogram. Her prior mammogram was 2 years ago. She noticed mild left nipple retraction, no palpable mass, no other changes. She feels well overall, denies any significant pain except mild right knee pain from arthritis. She denies any change of her appetite, or recent weight loss. 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   Her family history  is notable for breast cancer in her brother, pancreatic cancer in another brother, and colon cancer in her mother. She is postmenopausal, last menstrual period 8 years ago. She has a daughter and a son. She lives with her brother, and her daughter is likely going to stay with her.  CURRENT TREATMENT:  Letrozole 2.5 mg once daily, started on 07/12/2015  Mary Randall returns for follow up.  She is doing well overall, still has mild fatigue, no pain or other complains Tolerating letrozole well, no hot flush or other issues  Plan to go back to work with light duty job   MEDICAL HISTORY:  Past Medical History:  Diagnosis Date  . Angina effort ([place])    none recent  .  Arthritis    "right knee" (03/16/2015)  . Breast cancer, left ([place]) dx'd 2016  . 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 Family history of breast cancer   . Family history of breast cancer in female   . Family history of pancreatic cancer   . Hx of radiation therapy 05/17/15- 06/28/15   Left Breast  . Hypertension   . NSTEMI (non-ST elevated myocardial infarction) ([place]) 03/16/2014   non-obs CAD, spasm seen  . Pneumonia 2009    SURGICAL HISTORY: Past Surgical History:  Procedure Laterality Date  . BREAST BIOPSY Left 08/06/14  . BREAST LUMPECTOMY WITH NEEDLE LOCALIZATION AND AXILLARY LYMPH NODE DISSECTION Left 03/15/2015   Procedure: LEFT BREAST LUMPECTOMY WITH BRACKETED NEEDLE LOCALIZATION AND AXILLARY LYMPH NODE DISSECTION;  Surgeon: Autumn Messing III, MD;  Location: [facility];  Service: General;  Laterality: Left;  . BUNIONECTOMY Bilateral   . CARDIAC CATHETERIZATION  2010; 03/16/2014  . ECTOPIC PREGNANCY SURGERY  1991   Tubal pregnancy  . LEFT HEART CATHETERIZATION WITH CORONARY ANGIOGRAM N/A 03/16/2014   Procedure: LEFT HEART CATHETERIZATION WITH CORONARY ANGIOGRAM;  Surgeon: Sherren Mocha, MD; Non-obs CAD, EF 55%  . PORT-A-CATH REMOVAL Right 03/15/2015   Procedure: REMOVAL PORT-A-CATH;  Surgeon: Autumn Messing III, MD;  Location: [place];  Service: General;  Laterality: Right;  . PORTACATH PLACEMENT N/A 08/28/2014   Procedure: INSERTION PORT-A-CATH;  Surgeon: Autumn Messing III, MD;  Location: [facility];  Service: General;  Laterality: N/A;  . RE-EXCISION OF BREAST CANCER,SUPERIOR MARGINS Left 04/21/2015   Procedure: RE-EXCISION OF BREAST CANCER, INFERIOR MARGIN;  Surgeon: Autumn Messing III, MD;  Location: [facility];  Service: General;  Laterality: Left;  . TUBAL LIGATION  1981   GYN HISTORY  Menarchal: 15 LMP: 51 Contraceptive: 4 HRT: no G2P2: no breast feeding     SOCIAL HISTORY: Social History   Social History  . Marital Status: Legally Separated    Spouse Name: N/A  . Number  of Children: Daughter 65, son 49   . Years of Education: N/A   Occupational History  . Works for post office    Social History Main Topics  . Smoking status: Never Smoker   . Smokeless tobacco: Never Used  . Alcohol Use: No  . Drug Use: No  . Sexual Activity: No   Other Topics Concern  . Not on file   Social History Narrative    FAMILY HISTORY: Family History  Problem Relation Age of Onset  . Pancreatic cancer Brother 69  . Cancer Brother 40    breast cancer   . Cancer Mother 76    colon cancer   . Hypertension Mother   . Diabetes type II Mother   . CVA Father   .  Healthy Sister   . Healthy Sister     ALLERGIES:  is allergic to carvedilol; codeine; percocet [oxycodone-acetaminophen]; adhesive [tape]; and compazine [prochlorperazine].  MEDICATIONS:  Current Outpatient Prescriptions  Medication Sig Dispense Refill  . amLODipine (NORVASC) 10 MG tablet Take 10 mg by mouth daily.    . Ascorbic Acid (VITAMIN C ADULT GUMMIES PO) Take 2 tablets by mouth daily.    Marland Kitchen atorvastatin (LIPITOR) 40 MG tablet take 1 tablet by mouth once daily AT 6 PM 90 tablet 2  . hydrochlorothiazide (HYDRODIURIL) 25 MG tablet Take 1 tablet (25 mg total) by mouth daily. 90 tablet 3  . irbesartan (AVAPRO) 300 MG tablet take 1 tablet by mouth once daily 30 tablet 11  . letrozole (FEMARA) 2.5 MG tablet Take 1 tablet (2.5 mg total) by mouth daily. 90 tablet 1  . non-metallic deodorant (ALRA) MISC Apply 1 application topically daily as needed.    . potassium chloride (K-DUR) 10 MEQ tablet take 1 tablet by mouth every evening 90 tablet 2  . nitroGLYCERIN (NITROSTAT) 0.4 MG SL tablet Place 1 tablet (0.4 mg total) under the tongue every 5 (five) minutes as needed for chest pain. (Patient not taking: Reported on 11/15/2015) 25 tablet 12  . ondansetron (ZOFRAN) 4 MG tablet Take 1 tablet (4 mg total) by mouth every 6 (six) hours. (Patient not taking: Reported on 11/15/2015) 12 tablet 0   No current  facility-administered medications for this visit.     REVIEW OF SYSTEMS:   Constitutional: Denies fevers, chills or abnormal night sweats Eyes: Denies blurriness of vision, double vision or watery eyes Ears, nose, mouth, throat, and face: Denies mucositis or sore throat Respiratory: Denies cough, dyspnea or wheezes Cardiovascular: Denies palpitation, chest discomfort or lower extremity swelling Gastrointestinal:  Denies nausea, heartburn or change in bowel habits Skin: Denies abnormal skin rashes Lymphatics: Denies new lymphadenopathy or easy bruising Neurological:Denies numbness, tingling or new weaknesses Behavioral/Psych: Mood is stable, no new changes  All other systems were reviewed with the patient and are negative.  PHYSICAL EXAMINATION: ECOG PERFORMANCE STATUS: 2  Vitals:   11/15/15 0911  BP: 118/84  Pulse: 79  Resp: 18  Temp: 97.7 F (36.5 C)   Filed Weights   11/15/15 0911  Weight: 176 lb 9.6 oz (80.1 kg)    GENERAL:alert, no distress and comfortable SKIN: skin color, texture, turgor are normal, no rashes or significant lesions, (+) skin pigmentation nails, no significant nail thickening  EYES: normal, conjunctiva are pink and non-injected, sclera clear OROPHARYNX:no exudate, no erythema and lips, buccal mucosa, and tongue normal  NECK: supple, thyroid normal size, non-tender, without nodularity LYMPH:  no palpable lymphadenopathy in the cervical, axillary or inguinal LUNGS: clear to auscultation and percussion with normal breathing effort HEART: regular rate & rhythm and no murmurs and no lower extremity edema ABDOMEN:abdomen soft, non-tender and normal bowel sounds Musculoskeletal:no cyanosis of digits and no clubbing, (+) right flank tenderness PSYCH: alert & oriented x 3 with fluent speech NEURO: no focal motor/sensory deficits Breasts: Breast inspection showed them to be symmetrical with no nipple discharge.  The surgical incision in the left breast and  axilla are well healed, Moderate skin pigmentation from radiation and mild Lymphedema of left breast, no skin ulcer or peeling. Exam of both breasts and axilla revealed no evidence of mass or adenopathy.   LABORATORY DATA:  I have reviewed the data as listed CBC Latest Ref Rng & Units 11/15/2015 08/16/2015 08/12/2015  WBC 3.9 - 10.3 10e3/uL 3.9  3.2(L) 4.0  Hemoglobin 11.6 - 15.9 g/dL 12.3 12.4 12.1  Hematocrit 34.8 - 46.6 % 38.0 38.4 37.1  Platelets 145 - 400 10e3/uL 183 202 199    CMP Latest Ref Rng & Units 11/15/2015 08/16/2015 08/12/2015  Glucose 70 - 140 mg/dl 106 93 135(H)  BUN 7.0 - 26.0 mg/dL 13.1 6.9(L) 7  Creatinine 0.6 - 1.1 mg/dL 1.0 1.0 0.96  Sodium 136 - 145 mEq/L 142 143 139  Potassium 3.5 - 5.1 mEq/L 3.7 3.9 4.1  Chloride 101 - 111 mmol/L - - 103  CO2 22 - 29 mEq/L _0 Calcium 8.4 - 10.4 mg/dL 11.2(H) 11.1(H) 10.7(H)  Total Protein 6.4 - 8.3 g/dL 7.4 7.4 -  Total Bilirubin 0.20 - 1.20 mg/dL 0.70 0.72 -  Alkaline Phos 40 - 150 U/L 107 102 -  AST 5 - 34 U/L 27 21 -  ALT 0 - 55 U/L 38 24 -    PATHOLOGY REPORT: Diagnosis 03/15/2015 1. 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 - INTERMEDIATE GRADE DUCTAL CARCINOMA IN SITU. - DUCTAL CARCINOMA IN SITU IS FOCALLY 0.1 TO 0.2 CM TO SUPERIOR MARGIN. - DUCTAL CARCINOMA IN SITU IS FOCALLY 0.2 CM TO LATERAL MARGIN. - INVASIVE DUCTAL CARCINOMA INVOLVES LATERAL MARGIN. - PLEASE CORRELATE WITH LOCATION OF SPECIMENS NUMBER 2 (ADDITIONAL SUPERIOR MARGIN) AND NUMBER 3 (ADDITIONAL LATERAL MARGIN) FOR FINAL MARGIN STATUS. - INVASIVE DUCTAL CARCINOMA FOCALLY INVOLVES INFERIOR MARGIN. - OTHER MARGINS ARE NEGATIVE. - SEE ONCOLOGY TEMPLATE. 2. Breast, excision, Left additional superior margin - FOCAL INTERMEDIATE DUCTAL CARCINOMA IN SITU. - MARGIN IS NEGATIVE (TUMOR IS GREATER THAN 0.4 CM TO MARGIN). 3. Breast, excision, Left additional lateral margin - BENIGN BREAST  PARENCHYMA WITH FIBROCYSTIC CHANGES. - NO TUMOR SEEN. 4. 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 Microscopic Comment 1. BREAST, INVASIVE TUMOR, WITH LYMPH NODES PRESENT Specimen, including laterality and lymph node sampling (sentinel, non-sentinel): Left partial breast with left additional superior margin and left additional lateral margin; left non-sentinel lymph nodes. Procedure: Left breast lumpectomy with additional left superior margin and additional left lateral margin excisions; left axillary lymph node dissection. Histologic type: Invasive ductal carcinoma. Grade: Tubule formation: 3. Nuclear pleomorphism: 2. Mitotic:1. Tumor size (gross measurement): 2 cm. Margins: Invasive, distance to closest margin: Invasive ductal carcinoma involves lateral margin on initial lumpectomy specimen but additional lateral margin (specimen number 3) is negative for invasive ductal carcinoma; invasive ductal carcinoma focally involves inferior margin. In-situ, distance to closest margin: Ductal carcinoma in situ is focally 0.1 to 0.2 cm to superior margin on initial lumpectomy specimen but is at least 0.4 cm away from additional superior margin (see specimen #4).. If margin positive, focally or broadly: Initial breast lumpectomy demonstrates fairly broad involvement of the lateral margin; however, the additional lateral margin excision is negative for invasive ductal carcinoma; invasive ductal carcinoma focally involves the inferior margin. Lymphovascular invasion: Definitive lymphovascular invasion is not identified; however, a lymph node is positive for metastatic ductal carcinoma, see below. Ductal carcinoma in situ: Yes. Grade: Intermediate grade. Extensive intraductal component: No. Lobular neoplasia: Not identified. Tumor focality: Unifocal. Treatment effect: Some degree of treatment effect is  present in the breast tissue. If present, treatment effect in breast tissue, lymph nodes or both: Treatment effect is only seen in breast tissue. Extent of tumor: Tumor present in breast tissue. Skin: Not received. Nipple: Not received. Skeletal muscle:  Not received. Lymph nodes: Examined: 0 Sentinel. 15 Non-sentinel. 15 Total. Lymph nodes with metastasis: 1. Isolated tumor cells (< 0.2 mm): 0.  Microscopic Comment(continued) Micrometastasis: (> 0.2 mm and < 2.0 mm): 0. Macrometastasis: (> 2.0 mm): 1. Extracapsular extension: Not identified. Breast prognostic profile: Performed on previous case, [phone] Estrogen receptor: 100%, positive. Progesterone receptor: 5%, positive. Her-2 neu: 1.47 ratio, negative. Ki-67: 50%. Non-neoplastic breast: Fibrocystic changes. TNM: ypT1c, ypN1a. Comments: As Her-2 neu was previously negative, this will be repeated on representative current tumor and reported in an addendum to follow. Dr. Lyndon Code has seen selected slides (1A, 1G and 2I) in consultation with agreement that invasive ductal carcinoma involves the lateral margin and focally involves the inferior margin and that ductal carcinoma in situ is focally 0.1 to 0.2 cm to superior margin. He is also in agreement that the findings in slide 2I represent focal ductal carcinoma in situ. (RH:ecj 03/17/2015)  Results: HER2 - NEGATIVE RATIO OF HER2/CEP17 SIGNALS 1.62 AVERAGE HER2 COPY NUMBER PER CELL 2.35  Diagnosis 04/21/2015 Breast, excision, left inferior margin BREAST TISSUE WITH PREVIOUS RESECTION SITE CHANGES NEGATIVE FOR MALIGNANCY   RADIOGRAPHIC STUDIES: I have personally reviewed the radiological images as listed and agreed with the findings in the report.  Echo 08/24/2014 Study Conclusions  - Left ventricle: The cavity size was normal. Wall thickness was normal. Systolic function was normal. The estimated ejection fraction was in the range of 55% to 60%. - Left atrium:  The atrium was mildly dilated.  MR breast b/l w wo contrast 02/01/2015  IMPRESSION: Interval decrease in size of retroareolar mass within the left    breast with decreased conspicuity of previously described non mass enhancement extending from the posterior margin of the mass.  Interval decrease in cortical thickening of previously described left axillary lymph nodes.  RECOMMENDATION: Treatment plan for left breast malignancy.    ASSESSMENT & PLAN:  60 year old African-American female, postmenopausal, healthy and fit, who was found to have a left breast mass and a biopsy confirmed node metastasis by screening mammogram.  1. Left breast invasive ductal carcinoma, cT3N1M0, stage IIIA, ER+/PR+/HER2-, ypT1cN1aM0 after neoadjuvant chemotherapy -I reviewed her surgical pathology findings with her and her daughter in great details. She had a partial response to neoadjuvant chemotherapy, tumor size has significantly decreased, however she still has 1 positive axilla node. -She had a reexcision for positive margins, final path was negative for malignant cells. - we again discussed the risk of cancer recurrence after her surgery,  Due to the locally advanced stage, persistent node-positive disease after neoadjuvant chemotherapy, she certainly has high risk for recurrence. --She has started adjuvant letrozole, tolerating well so far. We'll continue for 10 years if she is able tolerate -will continue breast cancer surveillance plan. She'll continue annual mammogram, follow-up regularly with lab and exam, every 3-4 months for the first 2-3 years, then every 6-12 months afterwards. -She is clinically doing well, lab results reviewed with her, exam was unremarkable. Her last mammogram in August was normal.  2. Bone health -A bone density scan from August 2017 was normal, T score -1.0. -She has hypercalcemia, not on calcium. -I encouraged her to exercise, especially weight-bearing exercise.  3.  Cancer genetics She has very strong family history of breast, colon and pancreatic cancer, all in first-degree, I recommend her to see genetic counselor to ruled out inheritable breast cancer syndrome.  -her genetic testing result was normal  4. History of non-STEMI in 03/2014 -She had cardiac catheterization, which showed mild  diffuse coronary artery disease, but no need intervention.  -She will follow-up with her cardiologist Dr. Burt Knack -We discussed some of the chemotherapy drug may cause heart failure, we'll repeat her echo after chemo, she may need follow-up with Dr. Burt Knack during her chemotherapy.   5.Peripheral neuropathy, G1 -Secondary to chemotherapy, overall much improved. - she will continue Neurontin  6. Hypercalcemia -Her calcium has been around 11 -I checked her PTH and vit D levels today, results are still pending -I encouraged her to follow-up with her primary care physician   Plan -Continue letrozole, I reviewed for her today -I'll see her back in 3 months with lab -She is due for next screening mammogram in August 2018  All questions were answered. The patient knows to call the clinic with any problems, questions or concerns. I spent 20 minutes counseling the patient face to face. The total time spent in the appointment was 25 minutes and more than 50% was on counseling.     Truitt Merle, MD 11/15/2015

## 2015-11-15 ENCOUNTER — Other Ambulatory Visit (HOSPITAL_BASED_OUTPATIENT_CLINIC_OR_DEPARTMENT_OTHER): Payer: Federal, State, Local not specified - PPO

## 2015-11-15 ENCOUNTER — Ambulatory Visit (HOSPITAL_BASED_OUTPATIENT_CLINIC_OR_DEPARTMENT_OTHER): Payer: Federal, State, Local not specified - PPO | Admitting: Hematology

## 2015-11-15 ENCOUNTER — Telehealth: Payer: Self-pay | Admitting: Hematology

## 2015-11-15 ENCOUNTER — Encounter: Payer: Self-pay | Admitting: Hematology

## 2015-11-15 VITALS — BP 118/84 | HR 79 | Temp 97.7°F | Resp 18 | Ht 67.0 in | Wt 176.6 lb

## 2015-11-15 DIAGNOSIS — C50212 Malignant neoplasm of upper-inner quadrant of left female breast: Secondary | ICD-10-CM

## 2015-11-15 DIAGNOSIS — Z170 Estrogen receptor positive status [ER+]: Secondary | ICD-10-CM

## 2015-11-15 DIAGNOSIS — C773 Secondary and unspecified malignant neoplasm of axilla and upper limb lymph nodes: Secondary | ICD-10-CM

## 2015-11-15 DIAGNOSIS — G620 Drug-induced polyneuropathy: Secondary | ICD-10-CM

## 2015-11-15 LAB — COMPREHENSIVE METABOLIC PANEL
ALK PHOS: 107 U/L (ref 40–150)
ALT: 38 U/L (ref 0–55)
AST: 27 U/L (ref 5–34)
Albumin: 3.9 g/dL (ref 3.5–5.0)
Anion Gap: 11 mEq/L (ref 3–11)
BUN: 13.1 mg/dL (ref 7.0–26.0)
CO2: 27 meq/L (ref 22–29)
Calcium: 11.2 mg/dL — ABNORMAL HIGH (ref 8.4–10.4)
Chloride: 105 mEq/L (ref 98–109)
Creatinine: 1 mg/dL (ref 0.6–1.1)
EGFR: 72 mL/min/{1.73_m2} — AB (ref >90)
GLUCOSE: 106 mg/dL (ref 70–140)
POTASSIUM: 3.7 meq/L (ref 3.5–5.1)
SODIUM: 142 meq/L (ref 136–145)
Total Bilirubin: 0.7 mg/dL (ref 0.20–1.20)
Total Protein: 7.4 g/dL (ref 6.4–8.3)

## 2015-11-15 LAB — CBC WITH DIFFERENTIAL/PLATELET
BASO%: 0.5 % (ref 0.0–2.0)
BASOS ABS: 0 10*3/uL (ref 0.0–0.1)
EOS ABS: 0.1 10*3/uL (ref 0.0–0.5)
EOS%: 1.4 % (ref 0.0–7.0)
HCT: 38 % (ref 34.8–46.6)
HGB: 12.3 g/dL (ref 11.6–15.9)
LYMPH%: 27.6 % (ref 14.0–49.7)
MCH: 29.1 pg (ref 25.1–34.0)
MCHC: 32.5 g/dL (ref 31.5–36.0)
MCV: 89.6 fL (ref 79.5–101.0)
MONO#: 0.4 10*3/uL (ref 0.1–0.9)
MONO%: 10.5 % (ref 0.0–14.0)
NEUT#: 2.3 10*3/uL (ref 1.5–6.5)
NEUT%: 60 % (ref 38.4–76.8)
Platelets: 183 10*3/uL (ref 145–400)
RBC: 4.24 10*6/uL (ref 3.70–5.45)
RDW: 14.7 % — ABNORMAL HIGH (ref 11.2–14.5)
WBC: 3.9 10*3/uL (ref 3.9–10.3)
lymph#: 1.1 10*3/uL (ref 0.9–3.3)

## 2015-11-15 MED ORDER — LETROZOLE 2.5 MG PO TABS: 2.5000 mg | ORAL_TABLET | Freq: Every day | ORAL | 1 refills | Status: DC

## 2015-11-15 NOTE — Telephone Encounter (Signed)
Appointments scheduled per 11/15/15 los. AVS report and appointment schedule given to patient per 11/15/15 los.  °

## 2015-11-16 LAB — VITAMIN D 25 HYDROXY (VIT D DEFICIENCY, FRACTURES): Vitamin D, 25-Hydroxy: 10.6 ng/mL — ABNORMAL LOW (ref 30.0–100.0)

## 2015-11-16 LAB — PTH, INTACT AND CALCIUM
Calcium, Ser: 10.7 mg/dL — ABNORMAL HIGH (ref 8.7–10.2)
PTH: 50 pg/mL (ref 15–65)

## 2015-11-19 ENCOUNTER — Telehealth: Payer: Self-pay | Admitting: *Deleted

## 2015-11-19 NOTE — Telephone Encounter (Signed)
Pt left vm that she had some paperwork that needed to be signed for her employer that she wants to bring by.  Informed that she could drop off & we would try to get that done.  She states it will only take @ 5 min.  Informed of process of 10 day turn-a-round but will see if this can be taken care of quickly.

## 2015-12-01 ENCOUNTER — Telehealth: Payer: Self-pay

## 2015-12-01 NOTE — Telephone Encounter (Signed)
Called and informed pt disability paper was ready to be picked up. Pt verbalized understanding.

## 2015-12-01 NOTE — Telephone Encounter (Signed)
error 

## 2015-12-31 ENCOUNTER — Other Ambulatory Visit: Payer: Self-pay | Admitting: Cardiovascular Disease

## 2016-01-14 ENCOUNTER — Other Ambulatory Visit: Payer: Self-pay | Admitting: *Deleted

## 2016-01-14 DIAGNOSIS — Z170 Estrogen receptor positive status [ER+]: Principal | ICD-10-CM

## 2016-01-14 DIAGNOSIS — C50212 Malignant neoplasm of upper-inner quadrant of left female breast: Secondary | ICD-10-CM

## 2016-01-14 MED ORDER — LETROZOLE 2.5 MG PO TABS: 2.5000 mg | ORAL_TABLET | Freq: Every day | ORAL | 2 refills | Status: DC

## 2016-01-25 DIAGNOSIS — C50212 Malignant neoplasm of upper-inner quadrant of left female breast: Secondary | ICD-10-CM | POA: Diagnosis not present

## 2016-01-30 ENCOUNTER — Telehealth: Payer: Self-pay | Admitting: Hematology

## 2016-01-30 NOTE — Telephone Encounter (Signed)
Unable to reach pt due to phone disconnected. Lvm for pt's daughter (emergency contact) advising appt chg from 2/13 to 2/26 @ 8am.

## 2016-02-15 ENCOUNTER — Other Ambulatory Visit: Payer: Federal, State, Local not specified - PPO

## 2016-02-15 ENCOUNTER — Ambulatory Visit: Payer: Federal, State, Local not specified - PPO | Admitting: Hematology

## 2016-02-24 NOTE — Progress Notes (Signed)
[place]  [phone] [phone]  Clinic follow up Note   Patient Care Team: Katherina Mires, MD as PCP - General (Family Medicine) Autumn Messing III, MD as Consulting Physician (General Surgery) Sherren Mocha, MD as Consulting Physician (Cardiology)  02/28/2016  CHIEF COMPLAINTS:  Follow up left breast cancer   Oncology History   Breast cancer of upper-inner quadrant of left female breast [facility])   Staging form: Breast, AJCC 7th Edition     Clinical: Stage IIIA (T3, N1, M0) - Unsigned     Pathologic stage from 03/15/2015: Stage IIA (yT1c, N1a, cM0) - Signed by Truitt Merle, MD on 04/02/2015       Breast cancer of upper-inner quadrant of left female breast ([place])   08/06/2014 Initial Diagnosis    Breast cancer of upper-inner quadrant of left female breast      08/06/2014 Initial Biopsy    Left breast 9:30 o'clock position showed invasive ductal carcinoma, and 1 left axillary lymph node biopsy showed positive for metastatic carcinoma.      08/06/2014 Receptors her2    ER 100% positive, PR 5% positive, HER2-, Ki-67 50%      08/06/2014 Mammogram    Diagnostic mammogram and ultrasound showed left breast mass with associated calcification measuring 5.5 cm, indeterminate a  left axillary lymph node was cortical thickening, circumscribed low density oval 70m mass at 12:00 within the left breast.      09/03/2014 - 01/26/2015 Neo-Adjuvant Chemotherapy    ddAC every 2 weeks X4, followed by weekly Taxol X12       09/23/2014 Procedure    Genetic testing: Negative. Genes evaluated: ATM, BARD1, BRCA1, BRCA2, BRIP1, CDH1, CHEK2, EPCAM, FANCC, MLH1, MSH2, MSH6, NBN, PALB2, PMS2, PTEN, RAD51C, RAD51D, TP53, & XRCC2.       03/15/2015 Surgery    left lumpectomy and axillary node dissection (Marlou Starks      03/15/2015 Pathology Results    left lumpectomy showed G2 invasive ductal carcinoma, 2cm, G2, and DCIS, G2. the lateral margin was positive for invasive tumor, 1 out of 15  nodes was positive.     ypT1c,ypN1a: Stage IIA       04/21/2015 Surgery    Re-excision for positive margin. Surgical path was negative for malignancy.      05/17/2015 - 06/28/2015 Radiation Therapy    adjuvant breast radiation (Isidore Moos. Left breast treated to 50 Gy in 25 fractions.  Left SCLV and PAB treated to 45 Gy in 25 fractions.  Left breast boost treated to 10 Gy in 5 fractions       07/12/2015 -  Anti-estrogen oral therapy    Letrozole 2.5 mg once daily. Planned duration of therapy: 10 years.       08/19/2015 Imaging    DEXA scan: Normal. (T-score -1.0)      08/24/2015 Mammogram    Diagnostic mammogram: No evidence for malignancy.        HISTORY OF PRESENTING ILLNESS:  Mary LOZOYA61y.o. female is here because of recently newly diagnosed left breast cancer. She presents to the clinic with her daughter.  This was discovered by screening mammogram. Her prior mammogram was 2 years ago. She noticed mild left nipple retraction, no palpable mass, no other changes. She feels well overall, denies any significant pain except mild right knee pain from arthritis. She denies any change of her appetite, or recent weight loss. 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   Her family history  is notable for breast cancer in her brother, pancreatic cancer in another brother, and colon cancer in her mother. She is postmenopausal, last menstrual period 8 years ago. She has a daughter and a son. She lives with her brother, and her daughter is likely going to stay with her.  CURRENT TREATMENT:  Letrozole 2.5 mg once daily, started on 07/12/2015  [place] returns for follow up. She has some pain and tenderness under in her left armpit. She still has some pain in her shoulders when she stretches too far. The tingling in her fingers and feet are still there. It is the worst at night. She is doing well on Letrozole, but still has hot flashes. Pt has some left arm  weakness. Denies leg swelling, or any other concerns.   MEDICAL HISTORY:  Past Medical History:  Diagnosis Date  . Angina effort ([place])    none recent  . Arthritis    "right knee" (03/16/2015)  . Breast cancer, left ([place]) dx'd 2016  . 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 Family history of breast cancer   . Family history of breast cancer in female   . Family history of pancreatic cancer   . Hx of radiation therapy 05/17/15- 06/28/15   Left Breast  . Hypertension   . NSTEMI (non-ST elevated myocardial infarction) ([place]) 03/16/2014   non-obs CAD, spasm seen  . Pneumonia 2009    SURGICAL HISTORY: Past Surgical History:  Procedure Laterality Date  . BREAST BIOPSY Left 08/06/14  . BREAST LUMPECTOMY WITH NEEDLE LOCALIZATION AND AXILLARY LYMPH NODE DISSECTION Left 03/15/2015   Procedure: LEFT BREAST LUMPECTOMY WITH BRACKETED NEEDLE LOCALIZATION AND AXILLARY LYMPH NODE DISSECTION;  Surgeon: Autumn Messing III, MD;  Location: [place];  Service: General;  Laterality: Left;  . BUNIONECTOMY Bilateral   . CARDIAC CATHETERIZATION  2010; 03/16/2014  . ECTOPIC PREGNANCY SURGERY  1991   Tubal pregnancy  . LEFT HEART CATHETERIZATION WITH CORONARY ANGIOGRAM N/A 03/16/2014   Procedure: LEFT HEART CATHETERIZATION WITH CORONARY ANGIOGRAM;  Surgeon: Sherren Mocha, MD; Non-obs CAD, EF 55%  . PORT-A-CATH REMOVAL Right 03/15/2015   Procedure: REMOVAL PORT-A-CATH;  Surgeon: Autumn Messing III, MD;  Location: [place];  Service: General;  Laterality: Right;  . PORTACATH PLACEMENT N/A 08/28/2014   Procedure: INSERTION PORT-A-CATH;  Surgeon: Autumn Messing III, MD;  Location: [place];  Service: General;  Laterality: N/A;  . RE-EXCISION OF BREAST CANCER,SUPERIOR MARGINS Left 04/21/2015   Procedure: RE-EXCISION OF BREAST CANCER, INFERIOR MARGIN;  Surgeon: Autumn Messing III, MD;  Location: [facility];  Service: General;  Laterality: Left;  . TUBAL LIGATION  1981   GYN HISTORY  Menarchal: 15 LMP: 51 Contraceptive:  4 HRT: no G2P2: no breast feeding    SOCIAL HISTORY: Social History   Social History  . Marital Status: Legally Separated    Spouse Name: N/A  . Number of Children: Daughter 54, son 36   . Years of Education: N/A   Occupational History  . Works for post office    Social History Main Topics  . Smoking status: Never Smoker   . Smokeless tobacco: Never Used  . Alcohol Use: No  . Drug Use: No  . Sexual Activity: No   Other Topics Concern  . Not on file   Social History Narrative    FAMILY HISTORY: Family History  Problem Relation Age of Onset  . Pancreatic cancer Brother 69  . Cancer Brother 40    breast cancer   .  Cancer Mother 8    colon cancer   . Hypertension Mother   . Diabetes type II Mother   . CVA Father   . Healthy Sister   . Healthy Sister     ALLERGIES:  is allergic to carvedilol; codeine; percocet [oxycodone-acetaminophen]; adhesive [tape]; and compazine [prochlorperazine].  MEDICATIONS:  Current Outpatient Prescriptions  Medication Sig Dispense Refill  . amLODipine (NORVASC) 10 MG tablet take 1 tablet by mouth daily 90 tablet 1  . atorvastatin (LIPITOR) 40 MG tablet take 1 tablet by mouth once daily AT 6 PM 90 tablet 2  . hydrochlorothiazide (HYDRODIURIL) 25 MG tablet     . irbesartan (AVAPRO) 300 MG tablet take 1 tablet by mouth once daily 30 tablet 11  . isosorbide mononitrate (IMDUR) 30 MG 24 hr tablet     . letrozole (FEMARA) 2.5 MG tablet Take 1 tablet (2.5 mg total) by mouth daily. 90 tablet 2  . potassium chloride (K-DUR) 10 MEQ tablet take 1 tablet by mouth every evening 90 tablet 2  . hydrochlorothiazide (HYDRODIURIL) 25 MG tablet Take 1 tablet (25 mg total) by mouth daily. 90 tablet 3  . nitroGLYCERIN (NITROSTAT) 0.4 MG SL tablet Place 1 tablet (0.4 mg total) under the tongue every 5 (five) minutes as needed for chest pain. (Patient not taking: Reported on 11/15/2015) 25 tablet 12  . ondansetron (ZOFRAN) 4 MG tablet Take 1 tablet (4 mg  total) by mouth every 6 (six) hours. (Patient not taking: Reported on 11/15/2015) 12 tablet 0   No current facility-administered medications for this visit.    REVIEW OF SYSTEMS:   Constitutional: Denies fevers, chills or abnormal night sweats (+) hot flashes Eyes: Denies blurriness of vision, double vision or watery eyes Ears, nose, mouth, throat, and face: Denies mucositis or sore throat Respiratory: Denies cough, dyspnea or wheezes Cardiovascular: Denies palpitation, chest discomfort or lower extremity swelling Gastrointestinal:  Denies nausea, heartburn or change in bowel habits Skin: Denies abnormal skin rashes Lymphatics: Denies new lymphadenopathy or easy bruising Neurological:Denies numbness, tingling or new weaknesses (+) tingling hands and feet Behavioral/Psych: Mood is stable, no new changes  Musculoskeletal: (+) left armpit pain, b/l shoulder pain, L arm weakness All other systems were reviewed with the patient and are negative.  PHYSICAL EXAMINATION: ECOG PERFORMANCE STATUS: 2  Vitals:   02/28/16 0820  BP: (!) 143/93  Pulse: 68  Resp: 18  Temp: 98.9 F (37.2 C)   Filed Weights   02/28/16 0820  Weight: 179 lb 3.2 oz (81.3 kg)   GENERAL:alert, no distress and comfortable SKIN: skin color, texture, turgor are normal, no rashes or significant lesions, (+) skin pigmentation nails, no significant nail thickening  EYES: normal, conjunctiva are pink and non-injected, sclera clear OROPHARYNX:no exudate, no erythema and lips, buccal mucosa, and tongue normal  NECK: supple, thyroid normal size, non-tender, without nodularity LYMPH:  no palpable lymphadenopathy in the cervical, axillary or inguinal LUNGS: clear to auscultation and percussion with normal breathing effort HEART: regular rate & rhythm and no murmurs and no lower extremity edema ABDOMEN:abdomen soft, non-tender and normal bowel sounds Musculoskeletal:no cyanosis of digits and no clubbing, (+) right flank  tenderness PSYCH: alert & oriented x 3 with fluent speech NEURO: no focal motor/sensory deficits Breasts: Breast inspection showed them to be symmetrical with no nipple discharge.  The surgical incision in the left breast and axilla are well healed, Moderate skin pigmentation from radiation and mild Lymphedema of left breast, no skin ulcer or peeling. Exam of both  breasts and axilla revealed no evidence of mass or adenopathy.  LABORATORY DATA:  I have reviewed the data as listed CBC Latest Ref Rng & Units 02/28/2016 11/15/2015 08/16/2015  WBC 3.9 - 10.3 10e3/uL 3.3(L) 3.9 3.2(L)  Hemoglobin 11.6 - 15.9 g/dL 13.0 12.3 12.4  Hematocrit 34.8 - 46.6 % 39.8 38.0 38.4  Platelets 145 - 400 10e3/uL 190 183 202    CMP Latest Ref Rng & Units 02/28/2016 11/15/2015 11/15/2015  Glucose 70 - 140 mg/dl 101 106 -  BUN 7.0 - 26.0 mg/dL 11.8 13.1 -  Creatinine 0.6 - 1.1 mg/dL 1.1 1.0 -  Sodium 136 - 145 mEq/L 142 142 -  Potassium 3.5 - 5.1 mEq/L 3.8 3.7 -  Chloride 101 - 111 mmol/L - - -  CO2 22 - 29 mEq/L 28 27 -  Calcium 8.4 - 10.4 mg/dL 11.1(H) 10.7(H) 11.2(H)  Total Protein 6.4 - 8.3 g/dL 7.4 7.4 -  Total Bilirubin 0.20 - 1.20 mg/dL 0.62 0.70 -  Alkaline Phos 40 - 150 U/L 105 107 -  AST 5 - 34 U/L 20 27 -  ALT 0 - 55 U/L 23 38 -    PATHOLOGY REPORT: Diagnosis 03/15/2015 1. 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 - INTERMEDIATE GRADE DUCTAL CARCINOMA IN SITU. - DUCTAL CARCINOMA IN SITU IS FOCALLY 0.1 TO 0.2 CM TO SUPERIOR MARGIN. - DUCTAL CARCINOMA IN SITU IS FOCALLY 0.2 CM TO LATERAL MARGIN. - INVASIVE DUCTAL CARCINOMA INVOLVES LATERAL MARGIN. - PLEASE CORRELATE WITH LOCATION OF SPECIMENS NUMBER 2 (ADDITIONAL SUPERIOR MARGIN) AND NUMBER 3 (ADDITIONAL LATERAL MARGIN) FOR FINAL MARGIN STATUS. - INVASIVE DUCTAL CARCINOMA FOCALLY INVOLVES INFERIOR MARGIN. - OTHER MARGINS ARE NEGATIVE. - SEE ONCOLOGY TEMPLATE. 2. Breast, excision, Left  additional superior margin - FOCAL INTERMEDIATE DUCTAL CARCINOMA IN SITU. - MARGIN IS NEGATIVE (TUMOR IS GREATER THAN 0.4 CM TO MARGIN). 3. Breast, excision, Left additional lateral margin - BENIGN BREAST PARENCHYMA WITH FIBROCYSTIC CHANGES. - NO TUMOR SEEN. 4. 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 Microscopic Comment 1. BREAST, INVASIVE TUMOR, WITH LYMPH NODES PRESENT Specimen, including laterality and lymph node sampling (sentinel, non-sentinel): Left partial breast with left additional superior margin and left additional lateral margin; left non-sentinel lymph nodes. Procedure: Left breast lumpectomy with additional left superior margin and additional left lateral margin excisions; left axillary lymph node dissection. Histologic type: Invasive ductal carcinoma. Grade: Tubule formation: 3. Nuclear pleomorphism: 2. Mitotic:1. Tumor size (gross measurement): 2 cm. Margins: Invasive, distance to closest margin: Invasive ductal carcinoma involves lateral margin on initial lumpectomy specimen but additional lateral margin (specimen number 3) is negative for invasive ductal carcinoma; invasive ductal carcinoma focally involves inferior margin. In-situ, distance to closest margin: Ductal carcinoma in situ is focally 0.1 to 0.2 cm to superior margin on initial lumpectomy specimen but is at least 0.4 cm away from additional superior margin (see specimen #4).. If margin positive, focally or broadly: Initial breast lumpectomy demonstrates fairly broad involvement of the lateral margin; however, the additional lateral margin excision is negative for invasive ductal carcinoma; invasive ductal carcinoma focally involves the inferior margin. Lymphovascular invasion: Definitive lymphovascular invasion is not identified; however, a lymph node is positive for metastatic ductal carcinoma, see below. Ductal  carcinoma in situ: Yes. Grade: Intermediate grade. Extensive intraductal component: No. Lobular neoplasia: Not identified. Tumor focality: Unifocal. Treatment effect: Some degree of treatment effect is present in the  breast tissue. If present, treatment effect in breast tissue, lymph nodes or both: Treatment effect is only seen in breast tissue. Extent of tumor: Tumor present in breast tissue. Skin: Not received. Nipple: Not received. Skeletal muscle: Not received. Lymph nodes: Examined: 0 Sentinel. 15 Non-sentinel. 15 Total. Lymph nodes with metastasis: 1. Isolated tumor cells (< 0.2 mm): 0.  Microscopic Comment(continued) Micrometastasis: (> 0.2 mm and < 2.0 mm): 0. Macrometastasis: (> 2.0 mm): 1. Extracapsular extension: Not identified. Breast prognostic profile: Performed on previous case, [phone] Estrogen receptor: 100%, positive. Progesterone receptor: 5%, positive. Her-2 neu: 1.47 ratio, negative. Ki-67: 50%. Non-neoplastic breast: Fibrocystic changes. TNM: ypT1c, ypN1a. Comments: As Her-2 neu was previously negative, this will be repeated on representative current tumor and reported in an addendum to follow. Dr. Lyndon Code has seen selected slides (1A, 1G and 2I) in consultation with agreement that invasive ductal carcinoma involves the lateral margin and focally involves the inferior margin and that ductal carcinoma in situ is focally 0.1 to 0.2 cm to superior margin. He is also in agreement that the findings in slide 2I represent focal ductal carcinoma in situ. (RH:ecj 03/17/2015)  Results: HER2 - NEGATIVE RATIO OF HER2/CEP17 SIGNALS 1.62 AVERAGE HER2 COPY NUMBER PER CELL 2.35  Diagnosis 04/21/2015 Breast, excision, left inferior margin BREAST TISSUE WITH PREVIOUS RESECTION SITE CHANGES NEGATIVE FOR MALIGNANCY   RADIOGRAPHIC STUDIES: I have personally reviewed the radiological images as listed and agreed with the findings in the report.  Diagnostic Mammogram  08/24/2015 IMPRESSION: No mammographic evidence for malignancy.  DEXA Scan 08/19/2015 The BMD measured at Femur Total Left is 0.879 g/cm2 with a T-score of -1.0. This patient is considered normal according to [facility]) criteria. This patient does not meet criteria for FRAX assessment.  Site Region Measured Date Measured Age YA BMD Significant CHANGE T-score  DualFemur Total Left 08/19/2015 59.6 -1.1 0.879 g/cm2  AP Spine L1-L4 08/19/2015 59.6 -[age] 1.183 g/cm2  Left Forearm Radius 33% 08/19/2015 59.6 -0.3 0.859 g/cm2  World Health Organization [facility]) criteria for post-menopausal, Caucasian Women: Normal       T-score at or above -1 SD Osteopenia   T-score between -1 and -2.5 SD Osteoporosis T-score at or below -2.5 SD  Echo 08/24/2014 Study Conclusions  - Left ventricle: The cavity size was normal. Wall thickness was normal. Systolic function was normal. The estimated ejection fraction was in the range of 55% to 60%. - Left atrium: The atrium was mildly dilated.  MR breast b/l w wo contrast 02/01/2015  IMPRESSION: Interval decrease in size of retroareolar mass within the left    breast with decreased conspicuity of previously described non mass enhancement extending from the posterior margin of the mass.  Interval decrease in cortical thickening of previously described left axillary lymph nodes.  RECOMMENDATION: Treatment plan for left breast malignancy.  ASSESSMENT & PLAN:   61 y.o. African-American female, postmenopausal, healthy and fit, who was found to have a left breast mass and a biopsy confirmed node metastasis by screening mammogram.  1. Left breast invasive ductal carcinoma, cT3N1M0, stage IIIA, ER+/PR+/HER2-, ypT1cN1aM0 after neoadjuvant chemotherapy -I previously reviewed her surgical pathology findings with her and her daughter in great details. She had a partial response to neoadjuvant chemotherapy, tumor size has  significantly decreased, however she still has 1 positive axilla node. -She had a reexcision for positive margins, final path was negative for malignant cells. - we again discussed the risk of cancer recurrence after her surgery,  Due to the locally  advanced stage, persistent node-positive disease after neoadjuvant chemotherapy, she certainly has high risk for recurrence. -She has started adjuvant letrozole, tolerating well so far. We'll continue for 10 years if she is able tolerate -will continue breast cancer surveillance plan. She'll continue annual mammogram, follow-up regularly with lab and exam, every 3-4 months for the first 2-3 years, then every 6-12 months afterwards. -She is clinically doing well, lab results reviewed with her, exam was unremarkable. Her last mammogram in August was normal. No clinical concern for recurrence.  2. Bone health -A bone density scan from August 2017 was normal, T score -1.0. -She has hypercalcemia, not on calcium. -I encouraged her to exercise, especially weight-bearing exercise.  3. Cancer genetics -She has very strong family history of breast, colon and pancreatic cancer, all in first-degree, I recommend her to see genetic counselor to ruled out inheritable breast cancer syndrome.  -her genetic testing result was normal  4. History of non-STEMI in 03/2014 -She had cardiac catheterization, which showed mild diffuse coronary artery disease, but no need intervention.  -She will follow-up with her cardiologist Dr. Burt Knack -We again discussed some of the chemotherapy drug may cause heart failure, we'll repeat her echo after chemo, she may need follow-up with Dr. Burt Knack during her chemotherapy.   5.Peripheral neuropathy, G1 -Secondary to chemotherapy, overall much improved. - she will continue Neurontin -I have recommended B12.   6. Hypercalcemia -Her calcium has been around 11 -Her vitamin D level is low, PTH was normal. -I encouraged her to follow-up  with her primary care physician. I'll hold on vitamin D and calcium supplement until she sees her primary care physician.  Plan -Continue letrozole -I'll see her back in 4 months with lab -I will write her a letter indicating that she is still under my medical care. She is considering changing her career. -She is due for next screening mammogram in August 2018   All questions were answered. The patient knows to call the clinic with any problems, questions or concerns.  I spent 20 minutes counseling the patient face to face. The total time spent in the appointment was 25 minutes and more than 50% was on counseling.  This document serves as a record of services personally performed by Truitt Merle, MD. It was created on her behalf by Martinique Casey, a trained medical scribe. The creation of this record is based on the scribe's personal observations and the provider's statements to them. This document has been checked and approved by the attending provider.  I have reviewed the above documentation for accuracy and completeness and I agree with the above.   Truitt Merle, MD 02/28/2016

## 2016-02-28 ENCOUNTER — Telehealth: Payer: Self-pay | Admitting: Hematology

## 2016-02-28 ENCOUNTER — Encounter: Payer: Self-pay | Admitting: Hematology

## 2016-02-28 ENCOUNTER — Other Ambulatory Visit (HOSPITAL_BASED_OUTPATIENT_CLINIC_OR_DEPARTMENT_OTHER): Payer: Federal, State, Local not specified - PPO

## 2016-02-28 ENCOUNTER — Ambulatory Visit (HOSPITAL_BASED_OUTPATIENT_CLINIC_OR_DEPARTMENT_OTHER): Payer: Federal, State, Local not specified - PPO | Admitting: Hematology

## 2016-02-28 VITALS — BP 143/93 | HR 68 | Temp 98.9°F | Resp 18 | Ht 67.0 in | Wt 179.2 lb

## 2016-02-28 DIAGNOSIS — C779 Secondary and unspecified malignant neoplasm of lymph node, unspecified: Secondary | ICD-10-CM | POA: Diagnosis not present

## 2016-02-28 DIAGNOSIS — C50212 Malignant neoplasm of upper-inner quadrant of left female breast: Secondary | ICD-10-CM

## 2016-02-28 DIAGNOSIS — Z170 Estrogen receptor positive status [ER+]: Secondary | ICD-10-CM | POA: Diagnosis not present

## 2016-02-28 DIAGNOSIS — Z803 Family history of malignant neoplasm of breast: Secondary | ICD-10-CM | POA: Diagnosis not present

## 2016-02-28 LAB — CBC WITH DIFFERENTIAL/PLATELET
BASO%: 0.3 % (ref 0.0–2.0)
Basophils Absolute: 0 10*3/uL (ref 0.0–0.1)
EOS%: 0.9 % (ref 0.0–7.0)
Eosinophils Absolute: 0 10*3/uL (ref 0.0–0.5)
HCT: 39.8 % (ref 34.8–46.6)
HGB: 13 g/dL (ref 11.6–15.9)
LYMPH%: 41.3 % (ref 14.0–49.7)
MCH: 29.4 pg (ref 25.1–34.0)
MCHC: 32.7 g/dL (ref 31.5–36.0)
MCV: 90 fL (ref 79.5–101.0)
MONO#: 0.3 10*3/uL (ref 0.1–0.9)
MONO%: 9.4 % (ref 0.0–14.0)
NEUT#: 1.6 10*3/uL (ref 1.5–6.5)
NEUT%: 48.1 % (ref 38.4–76.8)
Platelets: 190 10*3/uL (ref 145–400)
RBC: 4.42 10*6/uL (ref 3.70–5.45)
RDW: 14 % (ref 11.2–14.5)
WBC: 3.3 10*3/uL — ABNORMAL LOW (ref 3.9–10.3)
lymph#: 1.4 10*3/uL (ref 0.9–3.3)

## 2016-02-28 LAB — COMPREHENSIVE METABOLIC PANEL
ALT: 23 U/L (ref 0–55)
ANION GAP: 9 meq/L (ref 3–11)
AST: 20 U/L (ref 5–34)
Albumin: 4.1 g/dL (ref 3.5–5.0)
Alkaline Phosphatase: 105 U/L (ref 40–150)
BUN: 11.8 mg/dL (ref 7.0–26.0)
CHLORIDE: 105 meq/L (ref 98–109)
CO2: 28 meq/L (ref 22–29)
CREATININE: 1.1 mg/dL (ref 0.6–1.1)
Calcium: 11.1 mg/dL — ABNORMAL HIGH (ref 8.4–10.4)
EGFR: 67 mL/min/{1.73_m2} — ABNORMAL LOW (ref >90)
Glucose: 101 mg/dl (ref 70–140)
POTASSIUM: 3.8 meq/L (ref 3.5–5.1)
Sodium: 142 mEq/L (ref 136–145)
Total Bilirubin: 0.62 mg/dL (ref 0.20–1.20)
Total Protein: 7.4 g/dL (ref 6.4–8.3)

## 2016-02-28 NOTE — Telephone Encounter (Signed)
Appointments scheduled per 02/28/16 los. Patient was given a copy of the AVS report and appointment schedule, per 02/28/16 los.

## 2016-03-06 NOTE — Addendum Note (Signed)
Addended by: Truitt Merle on: 03/06/2016 09:41 AM   Modules accepted: Orders

## 2016-03-08 ENCOUNTER — Other Ambulatory Visit (HOSPITAL_BASED_OUTPATIENT_CLINIC_OR_DEPARTMENT_OTHER): Payer: Federal, State, Local not specified - PPO

## 2016-03-08 DIAGNOSIS — C50212 Malignant neoplasm of upper-inner quadrant of left female breast: Secondary | ICD-10-CM | POA: Diagnosis not present

## 2016-03-12 LAB — PTH-RELATED PEPTIDE: PTHrP (PTH-Related Peptide): <1.1 pmol/L

## 2016-04-06 DIAGNOSIS — I10 Essential (primary) hypertension: Secondary | ICD-10-CM | POA: Diagnosis not present

## 2016-04-06 DIAGNOSIS — Z853 Personal history of malignant neoplasm of breast: Secondary | ICD-10-CM | POA: Diagnosis not present

## 2016-04-06 DIAGNOSIS — E559 Vitamin D deficiency, unspecified: Secondary | ICD-10-CM | POA: Diagnosis not present

## 2016-04-10 ENCOUNTER — Telehealth: Payer: Self-pay | Admitting: Medical Oncology

## 2016-04-10 NOTE — Telephone Encounter (Signed)
Has not heard back about papers for her return to work light duty .  She dropped off the papers  1 week ago.She needs to have them at work by [employer]. Please call her back with status.

## 2016-04-25 ENCOUNTER — Telehealth: Payer: Self-pay | Admitting: Hematology

## 2016-04-25 DIAGNOSIS — I2510 Atherosclerotic heart disease of native coronary artery without angina pectoris: Secondary | ICD-10-CM | POA: Diagnosis not present

## 2016-04-25 DIAGNOSIS — I10 Essential (primary) hypertension: Secondary | ICD-10-CM | POA: Diagnosis not present

## 2016-04-25 DIAGNOSIS — E7800 Pure hypercholesterolemia, unspecified: Secondary | ICD-10-CM | POA: Diagnosis not present

## 2016-04-25 DIAGNOSIS — Z853 Personal history of malignant neoplasm of breast: Secondary | ICD-10-CM | POA: Diagnosis not present

## 2016-04-25 NOTE — Telephone Encounter (Signed)
Completed forms on 04/11/16 and called patient to advise that disability papers are ready for pickup at front desk reception and scanned copy onto Epic

## 2016-05-13 ENCOUNTER — Other Ambulatory Visit: Payer: Self-pay | Admitting: Cardiovascular Disease

## 2016-06-30 ENCOUNTER — Telehealth: Payer: Self-pay | Admitting: Endocrinology

## 2016-06-30 ENCOUNTER — Telehealth: Payer: Self-pay | Admitting: Hematology

## 2016-06-30 NOTE — Telephone Encounter (Signed)
R/s per 7/2 appt PAL - patient is aware of appt date and time.

## 2016-06-30 NOTE — Telephone Encounter (Signed)
Calling to check on the status of paper referral. Please call with information

## 2016-07-03 ENCOUNTER — Ambulatory Visit: Payer: Federal, State, Local not specified - PPO | Admitting: Hematology

## 2016-07-03 ENCOUNTER — Other Ambulatory Visit: Payer: Federal, State, Local not specified - PPO

## 2016-07-14 NOTE — Progress Notes (Signed)
[place]  Telephone:([phone]) [phone] [phone]  Clinic follow up Note   Patient Care Team: Rankins, Bill Salinas, MD as PCP - General (Family Medicine) Jovita Kussmaul, MD as Consulting Physician (General Surgery) Sherren Mocha, MD as Consulting Physician (Cardiology)  07/19/2016  CHIEF COMPLAINTS:  Follow up left breast cancer   Oncology History   Breast cancer of upper-inner quadrant of left female breast [facility])   Staging form: Breast, AJCC 7th Edition     Clinical: Stage IIIA (T3, N1, M0) - Unsigned     Pathologic stage from 03/15/2015: Stage IIA (yT1c, N1a, cM0) - Signed by Truitt Merle, MD on 04/02/2015       Breast cancer of upper-inner quadrant of left female breast ([facility])   08/06/2014 Initial Diagnosis    Breast cancer of upper-inner quadrant of left female breast      08/06/2014 Initial Biopsy    Left breast 9:30 o'clock position showed invasive ductal carcinoma, and 1 left axillary lymph node biopsy showed positive for metastatic carcinoma.      08/06/2014 Receptors her2    ER 100% positive, PR 5% positive, HER2-, Ki-67 50%      08/06/2014 Mammogram    Diagnostic mammogram and ultrasound showed left breast mass with associated calcification measuring 5.5 cm, indeterminate a  left axillary lymph node was cortical thickening, circumscribed low density oval 58m mass at 12:00 within the left breast.      09/03/2014 - 01/26/2015 Neo-Adjuvant Chemotherapy    ddAC every 2 weeks X4, followed by weekly Taxol X12       09/23/2014 Procedure    Genetic testing: Negative. Genes evaluated: ATM, BARD1, BRCA1, BRCA2, BRIP1, CDH1, CHEK2, EPCAM, FANCC, MLH1, MSH2, MSH6, NBN, PALB2, PMS2, PTEN, RAD51C, RAD51D, TP53, & XRCC2.       03/15/2015 Surgery    left lumpectomy and axillary node dissection (Marlou Starks      03/15/2015 Pathology Results    left lumpectomy showed G2 invasive ductal carcinoma, 2cm, G2, and DCIS, G2. the lateral margin was positive for invasive tumor, 1  out of 15 nodes was positive.     ypT1c,ypN1a: Stage IIA       04/21/2015 Surgery    Re-excision for positive margin. Surgical path was negative for malignancy.      05/17/2015 - 06/28/2015 Radiation Therapy    adjuvant breast radiation (Isidore Moos. Left breast treated to 50 Gy in 25 fractions.  Left SCLV and PAB treated to 45 Gy in 25 fractions.  Left breast boost treated to 10 Gy in 5 fractions       07/12/2015 -  Anti-estrogen oral therapy    Letrozole 2.5 mg once daily. Planned duration of therapy: 10 years.       08/19/2015 Imaging    DEXA scan: Normal. (T-score -1.0)      08/24/2015 Mammogram    Diagnostic mammogram: No evidence for malignancy.        HISTORY OF PRESENTING ILLNESS:  KQUINESHA SELINGER61y.o. female is here because of recently newly diagnosed left breast cancer. She presents to the clinic with her daughter.  This was discovered by screening mammogram. Her prior mammogram was 2 years ago. She noticed mild left nipple retraction, no palpable mass, no other changes. She feels well overall, denies any significant pain except mild right knee pain from arthritis. She denies any change of her appetite, or recent weight loss. 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   Her family history  is notable for breast cancer in her brother, pancreatic cancer in another brother, and colon cancer in her mother. She is postmenopausal, last menstrual period 8 years ago. She has a daughter and a son. She lives with her brother, and her daughter is likely going to stay with her.  CURRENT TREATMENT:  Letrozole 2.5 mg once daily, started on 07/12/2015  INTERIM HISTORY  Mardy returns for follow up. She presents to the clinic today. She reports she is well. She only gets shoulder and upper arm pain if she reaches wrong or reaches back. She has no swelling of her arm. She denies other pain or stomach issues.  She has no problems with letrozole, she has no hot flashes or  joint pain.  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 She does not have many walking routes and may have to carry the bag.  She had FMLA and ran out. She says she feels better to go back now. She is mostly back to her normal functions.   She wants to know her calcium levels because her PCP, Dr. Barbaraann Barthel noted it was high before. She agrees to continue to f/u with her PCP on it.    MEDICAL HISTORY:  Past Medical History:  Diagnosis Date  . Angina effort (HCC)    none recent  . Arthritis    "right knee" (03/16/2015)  . Breast cancer, left (HCC) dx'd 2016  . 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 Family history of breast cancer   . Family history of breast cancer in female   . Family history of pancreatic cancer   . Hx of radiation therapy 05/17/15- 06/28/15   Left Breast  . Hypertension   . NSTEMI (non-ST elevated myocardial infarction) (HCC) 03/16/2014   non-obs CAD, spasm seen  . Pneumonia 2009    SURGICAL HISTORY: Past Surgical History:  Procedure Laterality Date  . BREAST BIOPSY Left 08/06/14  . BREAST LUMPECTOMY WITH NEEDLE LOCALIZATION AND AXILLARY LYMPH NODE DISSECTION Left 03/15/2015   Procedure: LEFT BREAST LUMPECTOMY WITH BRACKETED NEEDLE LOCALIZATION AND AXILLARY LYMPH NODE DISSECTION;  Surgeon: Chevis Pretty III, MD;  Location: [facility] OR;  Service: General;  Laterality: Left;  . BUNIONECTOMY Bilateral   . CARDIAC CATHETERIZATION  2010; 03/16/2014  . ECTOPIC PREGNANCY SURGERY  1991   Tubal pregnancy  . LEFT HEART CATHETERIZATION WITH CORONARY ANGIOGRAM N/A 03/16/2014   Procedure: LEFT HEART CATHETERIZATION WITH CORONARY ANGIOGRAM;  Surgeon: Tonny Bollman, MD; Non-obs CAD, EF 55%  . PORT-A-CATH REMOVAL Right 03/15/2015   Procedure: REMOVAL PORT-A-CATH;  Surgeon: Chevis Pretty III, MD;  Location: [facility] OR;  Service: General;  Laterality: Right;  . PORTACATH  PLACEMENT N/A 08/28/2014   Procedure: INSERTION PORT-A-CATH;  Surgeon: Chevis Pretty III, MD;  Location: [facility] OR;  Service: General;  Laterality: N/A;  . RE-EXCISION OF BREAST CANCER,SUPERIOR MARGINS Left 04/21/2015   Procedure: RE-EXCISION OF BREAST CANCER, INFERIOR MARGIN;  Surgeon: Chevis Pretty III, MD;  Location: [facility] ORS;  Service: General;  Laterality: Left;  . TUBAL LIGATION  1981   GYN HISTORY  Menarchal: 15 LMP: 51 Contraceptive: 4 HRT: no G2P2: no breast feeding    SOCIAL HISTORY: Social History   Social History  . Marital Status: Legally Separated    Spouse Name: N/A  . Number of Children: Daughter 56, son 39   .  Years of Education: N/A   Occupational History  . Works for post office    Social History Main Topics  . Smoking status: Never Smoker   . Smokeless tobacco: Never Used  . Alcohol Use: No  . Drug Use: No  . Sexual Activity: No   Other Topics Concern  . Not on file   Social History Narrative    FAMILY HISTORY: Family History  Problem Relation Age of Onset  . Pancreatic cancer Brother 41  . Cancer Brother 40       breast cancer   . Cancer Mother 64       colon cancer   . Hypertension Mother   . Diabetes type II Mother   . CVA Father   . Healthy Sister   . Healthy Sister     ALLERGIES:  is allergic to carvedilol; codeine; percocet [oxycodone-acetaminophen]; adhesive [tape]; and compazine [prochlorperazine].  MEDICATIONS:  Current Outpatient Prescriptions  Medication Sig Dispense Refill  . amLODipine (NORVASC) 10 MG tablet take 1 tablet by mouth daily 90 tablet 1  . atorvastatin (LIPITOR) 40 MG tablet take 1 tablet by mouth once daily AT 6 PM 90 tablet 2  . hydrochlorothiazide (HYDRODIURIL) 25 MG tablet Take 1 tablet (25 mg total) by mouth daily. 90 tablet 3  . irbesartan (AVAPRO) 300 MG tablet take 1 tablet by mouth once daily 30 tablet 11  . isosorbide mononitrate (IMDUR) 30 MG 24 hr tablet take 1 tablet by mouth once daily 30 tablet 2  . letrozole  (FEMARA) 2.5 MG tablet Take 1 tablet (2.5 mg total) by mouth daily. 90 tablet 3  . potassium chloride (K-DUR) 10 MEQ tablet take 1 tablet by mouth every evening 90 tablet 2  . nitroGLYCERIN (NITROSTAT) 0.4 MG SL tablet Place 1 tablet (0.4 mg total) under the tongue every 5 (five) minutes as needed for chest pain. (Patient not taking: Reported on 11/15/2015) 25 tablet 12   No current facility-administered medications for this visit.    REVIEW OF SYSTEMS:   Constitutional: Denies fevers, chills or abnormal night sweats  Eyes: Denies blurriness of vision, double vision or watery eyes Ears, nose, mouth, throat, and face: Denies mucositis or sore throat Respiratory: Denies cough, dyspnea or wheezes Cardiovascular: Denies palpitation, chest discomfort or lower extremity swelling Gastrointestinal:  Denies nausea, heartburn or change in bowel habits Skin: Denies abnormal skin rashes Lymphatics: Denies new lymphadenopathy or easy bruising Neurological:Denies numbness, tingling or new weaknesses (+) tingling hands and feet Behavioral/Psych: Mood is stable, no new changes  Musculoskeletal: (+) left armpit pain, b/l shoulder pain, L arm weakness All other systems were reviewed with the patient and are negative.  PHYSICAL EXAMINATION: ECOG PERFORMANCE STATUS: 0  Vitals:   07/19/16 0825  BP: 128/86  Pulse: 79  Resp: 18  Temp: 98.9 F (37.2 C)   Filed Weights   07/19/16 0825  Weight: 184 lb 11.2 oz (83.8 kg)     GENERAL:alert, no distress and comfortable SKIN: skin color, texture, turgor are normal, no rashes or significant lesions, (+) skin pigmentation nails, no significant nail thickening  EYES: normal, conjunctiva are pink and non-injected, sclera clear OROPHARYNX:no exudate, no erythema and lips, buccal mucosa, and tongue normal  NECK: supple, thyroid normal size, non-tender, without nodularity LYMPH:  no palpable lymphadenopathy in the cervical, axillary or inguinal LUNGS: clear to  auscultation and percussion with normal breathing effort HEART: regular rate & rhythm and no murmurs and no lower extremity edema ABDOMEN:abdomen soft, non-tender and normal bowel  sounds Musculoskeletal:no cyanosis of digits and no clubbing, (+) right flank tenderness PSYCH: alert & oriented x 3 with fluent speech NEURO: no focal motor/sensory deficits Breasts: Breast inspection showed them to be symmetrical with no nipple discharge. The surgical incision in the left breast and axilla are well healed, Moderate hyperpigmentation from radiation and mild Lymphedema of left breast, no skin ulcer or peeling. Exam of both breasts and axilla revealed no evidence of mass or adenopathy.   LABORATORY DATA:  I have reviewed the data as listed CBC Latest Ref Rng & Units 07/19/2016 02/28/2016 11/15/2015  WBC 3.9 - 10.3 10e3/uL 4.4 3.3(L) 3.9  Hemoglobin 11.6 - 15.9 g/dL 12.2 13.0 12.3  Hematocrit 34.8 - 46.6 % 37.7 39.8 38.0  Platelets 145 - 400 10e3/uL 176 190 183    CMP Latest Ref Rng & Units 07/19/2016 02/28/2016 11/15/2015  Glucose 70 - 140 mg/dl 112 101 106  BUN 7.0 - 26.0 mg/dL 13.1 11.8 13.1  Creatinine 0.6 - 1.1 mg/dL 1.1 1.1 1.0  Sodium 136 - 145 mEq/L 142 142 142  Potassium 3.5 - 5.1 mEq/L 3.7 3.8 3.7  Chloride 101 - 111 mmol/L - - -  CO2 22 - 29 mEq/L '26 28 27  '$ Calcium 8.4 - 10.4 mg/dL 10.9(H) 11.1(H) 10.7(H)  Total Protein 6.4 - 8.3 g/dL 6.8 7.4 7.4  Total Bilirubin 0.20 - 1.20 mg/dL 0.65 0.62 0.70  Alkaline Phos 40 - 150 U/L 109 105 107  AST 5 - 34 U/L '14 20 27  '$ ALT 0 - 55 U/L 14 23 38    PATHOLOGY REPORT: Diagnosis 03/15/2015 1. 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 - INTERMEDIATE GRADE DUCTAL CARCINOMA IN SITU. - DUCTAL CARCINOMA IN SITU IS FOCALLY 0.1 TO 0.2 CM TO SUPERIOR MARGIN. - DUCTAL CARCINOMA IN SITU IS FOCALLY 0.2 CM TO LATERAL MARGIN. - INVASIVE DUCTAL CARCINOMA INVOLVES LATERAL MARGIN. - PLEASE CORRELATE  WITH LOCATION OF SPECIMENS NUMBER 2 (ADDITIONAL SUPERIOR MARGIN) AND NUMBER 3 (ADDITIONAL LATERAL MARGIN) FOR FINAL MARGIN STATUS. - INVASIVE DUCTAL CARCINOMA FOCALLY INVOLVES INFERIOR MARGIN. - OTHER MARGINS ARE NEGATIVE. - SEE ONCOLOGY TEMPLATE. 2. Breast, excision, Left additional superior margin - FOCAL INTERMEDIATE DUCTAL CARCINOMA IN SITU. - MARGIN IS NEGATIVE (TUMOR IS GREATER THAN 0.4 CM TO MARGIN). 3. Breast, excision, Left additional lateral margin - BENIGN BREAST PARENCHYMA WITH FIBROCYSTIC CHANGES. - NO TUMOR SEEN. 4. 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 Microscopic Comment 1. BREAST, INVASIVE TUMOR, WITH LYMPH NODES PRESENT Specimen, including laterality and lymph node sampling (sentinel, non-sentinel): Left partial breast with left additional superior margin and left additional lateral margin; left non-sentinel lymph nodes. Procedure: Left breast lumpectomy with additional left superior margin and additional left lateral margin excisions; left axillary lymph node dissection. Histologic type: Invasive ductal carcinoma. Grade: Tubule formation: 3. Nuclear pleomorphism: 2. Mitotic:1. Tumor size (gross measurement): 2 cm. Margins: Invasive, distance to closest margin: Invasive ductal carcinoma involves lateral margin on initial lumpectomy specimen but additional lateral margin (specimen number 3) is negative for invasive ductal carcinoma; invasive ductal carcinoma focally involves inferior margin. In-situ, distance to closest margin: Ductal carcinoma in situ is focally 0.1 to 0.2 cm to superior margin on initial lumpectomy specimen but is at least 0.4 cm away from additional superior margin (see specimen #4).. If margin positive, focally or broadly: Initial breast lumpectomy demonstrates fairly broad involvement of the lateral margin; however, the  additional lateral margin  excision is negative for invasive ductal carcinoma; invasive ductal carcinoma focally involves the inferior margin. Lymphovascular invasion: Definitive lymphovascular invasion is not identified; however, a lymph node is positive for metastatic ductal carcinoma, see below. Ductal carcinoma in situ: Yes. Grade: Intermediate grade. Extensive intraductal component: No. Lobular neoplasia: Not identified. Tumor focality: Unifocal. Treatment effect: Some degree of treatment effect is present in the breast tissue. If present, treatment effect in breast tissue, lymph nodes or both: Treatment effect is only seen in breast tissue. Extent of tumor: Tumor present in breast tissue. Skin: Not received. Nipple: Not received. Skeletal muscle: Not received. Lymph nodes: Examined: 0 Sentinel. 15 Non-sentinel. 15 Total. Lymph nodes with metastasis: 1. Isolated tumor cells (< 0.2 mm): 0.  Microscopic Comment(continued) Micrometastasis: (> 0.2 mm and < 2.0 mm): 0. Macrometastasis: (> 2.0 mm): 1. Extracapsular extension: Not identified. Breast prognostic profile: Performed on previous case, [phone] Estrogen receptor: 100%, positive. Progesterone receptor: 5%, positive. Her-2 neu: 1.47 ratio, negative. Ki-67: 50%. Non-neoplastic breast: Fibrocystic changes. TNM: ypT1c, ypN1a. Comments: As Her-2 neu was previously negative, this will be repeated on representative current tumor and reported in an addendum to follow. Dr. Lyndon Code has seen selected slides (1A, 1G and 2I) in consultation with agreement that invasive ductal carcinoma involves the lateral margin and focally involves the inferior margin and that ductal carcinoma in situ is focally 0.1 to 0.2 cm to superior margin. He is also in agreement that the findings in slide 2I represent focal ductal carcinoma in situ. (RH:ecj 03/17/2015)  Results: HER2 - NEGATIVE RATIO OF HER2/CEP17 SIGNALS 1.62 AVERAGE HER2 COPY NUMBER PER CELL 2.35  Diagnosis  04/21/2015 Breast, excision, left inferior margin BREAST TISSUE WITH PREVIOUS RESECTION SITE CHANGES NEGATIVE FOR MALIGNANCY   RADIOGRAPHIC STUDIES: I have personally reviewed the radiological images as listed and agreed with the findings in the report.  Diagnostic Mammogram 08/24/2015 IMPRESSION: No mammographic evidence for malignancy.  DEXA Scan 08/19/2015 The BMD measured at Femur Total Left is 0.879 g/cm2 with a T-score of -1.0. This patient is considered normal according to [facility]) criteria. This patient does not meet criteria for FRAX assessment.  Site Region Measured Date Measured Age YA BMD Significant CHANGE T-score  DualFemur Total Left 08/19/2015 59.6 -1.1 0.879 g/cm2  AP Spine L1-L4 08/19/2015 59.6 -0.2 1.183 g/cm2  Left Forearm Radius 33% 08/19/2015 59.6 -0.3 0.859 g/cm2  World Health Organization [facility]) criteria for post-menopausal, Caucasian Women: Normal       T-score at or above -1 SD Osteopenia   T-score between -1 and -2.5 SD Osteoporosis T-score at or below -2.5 SD  Echo 08/24/2014 Study Conclusions  - Left ventricle: The cavity size was normal. Wall thickness was normal. Systolic function was normal. The estimated ejection fraction was in the range of 55% to 60%. - Left atrium: The atrium was mildly dilated.  MR breast b/l w wo contrast 02/01/2015  IMPRESSION: Interval decrease in size of retroareolar mass within the left    breast with decreased conspicuity of previously described non mass enhancement extending from the posterior margin of the mass.  Interval decrease in cortical thickening of previously described left axillary lymph nodes.  RECOMMENDATION: Treatment plan for left breast malignancy.  ASSESSMENT & PLAN:   61 y.o. African-American female, postmenopausal, healthy and fit, who was found to have a left breast mass and a biopsy confirmed node metastasis by screening mammogram.  1. Left breast  invasive ductal carcinoma, cT3N1M0, stage IIIA, ER+/PR+/HER2-, ypT1cN1aM0 after  neoadjuvant chemotherapy -I previously reviewed her surgical pathology findings with her and her daughter in great details. She had a partial response to neoadjuvant chemotherapy, tumor size has significantly decreased, however she still has 1 positive axilla node. -She had a reexcision for positive margins, final path was negative for malignant cells. - we again discussed the risk of cancer recurrence after her surgery,  Due to the locally advanced stage, persistent node-positive disease after neoadjuvant chemotherapy, she certainly has high risk for recurrence. -She has started adjuvant letrozole, tolerating well so far. We'll continue for 7-10 years if she is able tolerate -will continue breast cancer surveillance plan. She'll continue annual mammogram, follow-up regularly with lab and exam, every 3-4 months for the first 2-3 years, then every 6-12 months afterwards. -She is clinically doing well, lab results reviewed with her, exam was unremarkable. Her last mammogram in August 2017 was normal. No clinical concern for recurrence. -She will have repeat mammogram in 08/2016 -Will f/u in 6 months    2. Bone health -A bone density scan from August 2017 was normal, T score -1.0. She will repeat in 2 years, 2019 -She has hypercalcemia, not on calcium. -I previously encouraged her to exercise, especially weight-bearing exercise and to drink more water.   3. Cancer genetics -She has very strong family history of breast, colon and pancreatic cancer, all in first-degree, I recommend her to see genetic counselor to ruled out inheritable breast cancer syndrome.  -her genetic testing result was normal  4. History of non-STEMI in 03/2014 -She had cardiac catheterization, which showed mild diffuse coronary artery disease, but no need intervention.  -She will follow-up with her cardiologist Dr. Burt Knack -We again discussed some of  the chemotherapy drug may cause heart failure, we'll repeat her echo after chemo, she may need follow-up with Dr. Burt Knack during her chemotherapy.   5.Peripheral neuropathy, G1 -Secondary to chemotherapy, overall much improved. - she will continue Neurontin -I have recommended B12.   6. Hypercalcemia -Her calcium has been around 11 -Her vitamin D level is low, PTH was normal. -I encouraged her to follow-up with her primary care physician Dr. Radene Ou. I'll copy my note to Dr. Radene Ou   Plan -Wrote letter for Pt's return to work with no restriction for 08/01/16 -Refill and continue letrozole -I'll see her back in 6 months with lab -She is due for next screening mammogram in August 2018 -copy Dr. Radene Ou for her hypercalcemia.   All questions were answered. The patient knows to call the clinic with any problems, questions or concerns.  I spent 20 minutes counseling the patient face to face. The total time spent in the appointment was 25 minutes and more than 50% was on counseling.  This document serves as a record of services personally performed by Truitt Merle, MD. It was created on her behalf by Joslyn Devon, a trained medical scribe. The creation of this record is based on the scribe's personal observations and the provider's statements to them. This document has been checked and approved by the attending provider.   I have reviewed the above documentation for accuracy and completeness and I agree with the above.   Truitt Merle, MD 07/19/2016

## 2016-07-19 ENCOUNTER — Encounter: Payer: Self-pay | Admitting: Hematology

## 2016-07-19 ENCOUNTER — Other Ambulatory Visit (HOSPITAL_BASED_OUTPATIENT_CLINIC_OR_DEPARTMENT_OTHER): Payer: Federal, State, Local not specified - PPO

## 2016-07-19 ENCOUNTER — Telehealth: Payer: Self-pay | Admitting: Hematology

## 2016-07-19 ENCOUNTER — Ambulatory Visit (HOSPITAL_BASED_OUTPATIENT_CLINIC_OR_DEPARTMENT_OTHER): Payer: Federal, State, Local not specified - PPO | Admitting: Hematology

## 2016-07-19 VITALS — BP 128/86 | HR 79 | Temp 98.9°F | Resp 18 | Ht 67.0 in | Wt 184.7 lb

## 2016-07-19 DIAGNOSIS — Z803 Family history of malignant neoplasm of breast: Secondary | ICD-10-CM | POA: Diagnosis not present

## 2016-07-19 DIAGNOSIS — G620 Drug-induced polyneuropathy: Secondary | ICD-10-CM | POA: Diagnosis not present

## 2016-07-19 DIAGNOSIS — Z800 Family history of malignant neoplasm of digestive organs: Secondary | ICD-10-CM

## 2016-07-19 DIAGNOSIS — C773 Secondary and unspecified malignant neoplasm of axilla and upper limb lymph nodes: Secondary | ICD-10-CM

## 2016-07-19 DIAGNOSIS — C50212 Malignant neoplasm of upper-inner quadrant of left female breast: Secondary | ICD-10-CM

## 2016-07-19 DIAGNOSIS — Z170 Estrogen receptor positive status [ER+]: Principal | ICD-10-CM

## 2016-07-19 LAB — CBC WITH DIFFERENTIAL/PLATELET
BASO%: 0.2 % (ref 0.0–2.0)
BASOS ABS: 0 10*3/uL (ref 0.0–0.1)
EOS%: 0.9 % (ref 0.0–7.0)
Eosinophils Absolute: 0 10*3/uL (ref 0.0–0.5)
HEMATOCRIT: 37.7 % (ref 34.8–46.6)
HEMOGLOBIN: 12.2 g/dL (ref 11.6–15.9)
LYMPH#: 2.1 10*3/uL (ref 0.9–3.3)
LYMPH%: 46.9 % (ref 14.0–49.7)
MCH: 28.8 pg (ref 25.1–34.0)
MCHC: 32.4 g/dL (ref 31.5–36.0)
MCV: 88.9 fL (ref 79.5–101.0)
MONO#: 0.4 10*3/uL (ref 0.1–0.9)
MONO%: 10.1 % (ref 0.0–14.0)
NEUT#: 1.8 10*3/uL (ref 1.5–6.5)
NEUT%: 41.9 % (ref 38.4–76.8)
Platelets: 176 10*3/uL (ref 145–400)
RBC: 4.24 10*6/uL (ref 3.70–5.45)
RDW: 14.2 % (ref 11.2–14.5)
WBC: 4.4 10*3/uL (ref 3.9–10.3)

## 2016-07-19 LAB — COMPREHENSIVE METABOLIC PANEL
ALBUMIN: 3.8 g/dL (ref 3.5–5.0)
ALK PHOS: 109 U/L (ref 40–150)
ALT: 14 U/L (ref 0–55)
AST: 14 U/L (ref 5–34)
Anion Gap: 10 mEq/L (ref 3–11)
BUN: 13.1 mg/dL (ref 7.0–26.0)
CALCIUM: 10.9 mg/dL — AB (ref 8.4–10.4)
CO2: 26 mEq/L (ref 22–29)
CREATININE: 1.1 mg/dL (ref 0.6–1.1)
Chloride: 106 mEq/L (ref 98–109)
EGFR: 64 mL/min/{1.73_m2} — ABNORMAL LOW (ref >90)
Glucose: 112 mg/dl (ref 70–140)
Potassium: 3.7 mEq/L (ref 3.5–5.1)
Sodium: 142 mEq/L (ref 136–145)
Total Bilirubin: 0.65 mg/dL (ref 0.20–1.20)
Total Protein: 6.8 g/dL (ref 6.4–8.3)

## 2016-07-19 MED ORDER — LETROZOLE 2.5 MG PO TABS: 2.5000 mg | ORAL_TABLET | Freq: Every day | ORAL | 3 refills | Status: DC

## 2016-07-19 NOTE — Telephone Encounter (Signed)
Scheduled appt per 7/18 los - Gave patient AVS and calender per los. Lab and f/u in 6 months.

## 2016-07-21 ENCOUNTER — Encounter: Payer: Self-pay | Admitting: *Deleted

## 2016-08-07 ENCOUNTER — Encounter: Payer: Self-pay | Admitting: Cardiovascular Disease

## 2016-08-07 ENCOUNTER — Ambulatory Visit (INDEPENDENT_AMBULATORY_CARE_PROVIDER_SITE_OTHER): Payer: Federal, State, Local not specified - PPO | Admitting: Cardiovascular Disease

## 2016-08-07 VITALS — BP 126/94 | HR 68 | Ht 67.5 in | Wt 184.0 lb

## 2016-08-07 DIAGNOSIS — I10 Essential (primary) hypertension: Secondary | ICD-10-CM | POA: Diagnosis not present

## 2016-08-07 DIAGNOSIS — I201 Angina pectoris with documented spasm: Secondary | ICD-10-CM | POA: Diagnosis not present

## 2016-08-07 MED ORDER — NITROGLYCERIN 0.4 MG SL SUBL: 0.4000 mg | SUBLINGUAL_TABLET | SUBLINGUAL | 12 refills | Status: DC | PRN

## 2016-08-07 NOTE — Patient Instructions (Signed)

## 2016-08-07 NOTE — Progress Notes (Signed)
Cardiology Office Note Date:  08/07/2016   ID:  Mary Randall, DOB 1955-11-09, MRN [id number]  PCP:  Aretta Nip, MD  Cardiologist:  Sherren Mocha, MD    Chief Complaint  Patient presents with  . Follow-up     History of Present Illness: Mary Randall is a 61 y.o. female who presents for Follow-up of Prinzmetal angina. She underwent cardiac catheterization in 2016 demonstrating mild diffuse nonobstructive CAD with normal LV function and normal LVEDP. At the time of her presentation her EKG was markedly abnormal. Coronary vasospasm was considered as a potential etiology of her symptoms. She was treated with isosorbide and amlodipine as well as aspirin and atorvastatin.  The patient is here alone today. She is doing well from a cardiac perspective and has not had any recent chest pain, shortness of breath, leg swelling, or heart palpitations. 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 She's hoping to lose some weight over the next several months. Her weight is up about 10 pounds over the past year.  Past Medical History:  Diagnosis Date  . Angina effort ([place])    none recent  . Arthritis    "right knee" (03/16/2015)  . Breast cancer, left ([place]) dx'd 2016  . Coronary vasospasm ([place]) 2010; 03/16/2014   Prinz-Metal's angina  . Family history of breast cancer   . Family history of breast cancer in female   . Family history of pancreatic cancer   . Hx of radiation therapy 05/17/15- 06/28/15   Left Breast  . Hypertension   . NSTEMI (non-ST elevated myocardial infarction) ([place]) 03/16/2014   non-obs CAD, spasm seen  . Pneumonia 2009    Past Surgical History:  Procedure Laterality Date  . BREAST BIOPSY Left 08/06/14  . BREAST LUMPECTOMY WITH NEEDLE LOCALIZATION AND AXILLARY LYMPH NODE DISSECTION Left 03/15/2015   Procedure: LEFT BREAST LUMPECTOMY WITH BRACKETED NEEDLE LOCALIZATION AND AXILLARY LYMPH NODE DISSECTION;  Surgeon: Autumn Messing III, MD;  Location: [facility];  Service: General;   Laterality: Left;  . BUNIONECTOMY Bilateral   . CARDIAC CATHETERIZATION  2010; 03/16/2014  . ECTOPIC PREGNANCY SURGERY  1991   Tubal pregnancy  . LEFT HEART CATHETERIZATION WITH CORONARY ANGIOGRAM N/A 03/16/2014   Procedure: LEFT HEART CATHETERIZATION WITH CORONARY ANGIOGRAM;  Surgeon: Sherren Mocha, MD; Non-obs CAD, EF 55%  . PORT-A-CATH REMOVAL Right 03/15/2015   Procedure: REMOVAL PORT-A-CATH;  Surgeon: Autumn Messing III, MD;  Location: [facility];  Service: General;  Laterality: Right;  . PORTACATH PLACEMENT N/A 08/28/2014   Procedure: INSERTION PORT-A-CATH;  Surgeon: Autumn Messing III, MD;  Location: [facility];  Service: General;  Laterality: N/A;  . RE-EXCISION OF BREAST CANCER,SUPERIOR MARGINS Left 04/21/2015   Procedure: RE-EXCISION OF BREAST CANCER, INFERIOR MARGIN;  Surgeon: Autumn Messing III, MD;  Location: [facility];  Service: General;  Laterality: Left;  . TUBAL LIGATION  1981    Current Outpatient Prescriptions  Medication Sig Dispense Refill  . amLODipine (NORVASC) 10 MG tablet take 1 tablet by mouth daily 90 tablet 1  . atorvastatin (LIPITOR) 40 MG tablet take 1 tablet by mouth once daily AT 6 PM 90 tablet 2  . irbesartan (AVAPRO) 300 MG tablet take 1 tablet by mouth once daily 30 tablet 11  . isosorbide mononitrate (IMDUR) 30 MG 24 hr tablet take 1 tablet by mouth once daily 30 tablet 2  . letrozole (FEMARA) 2.5 MG tablet Take 1 tablet (2.5 mg total) by mouth daily. 90 tablet 3  . potassium  chloride (K-DUR) 10 MEQ tablet take 1 tablet by mouth every evening 90 tablet 2  . hydrochlorothiazide (HYDRODIURIL) 25 MG tablet Take 1 tablet (25 mg total) by mouth daily. 90 tablet 3  . nitroGLYCERIN (NITROSTAT) 0.4 MG SL tablet Place 1 tablet (0.4 mg total) under the tongue every 5 (five) minutes as needed for chest pain. 25 tablet 12   No current facility-administered medications for this visit.     Allergies:   Carvedilol; Codeine; Percocet [oxycodone-acetaminophen]; Adhesive [tape]; and Compazine  [prochlorperazine]   Social History:  The patient  reports that she has never smoked. She has never used smokeless tobacco. She reports that she does not drink alcohol or use drugs.   Family History:  The patient's  family history includes Breast cancer (age of onset: 38) in her brother; CVA in her father; Colon cancer (age of onset: 31) in her mother; Diabetes type II in her mother; Healthy in her sister and sister; Hypertension in her mother; Pancreatic cancer (age of onset: 79) in her brother.    ROS:  Please see the history of present illness.  All other systems are reviewed and negative.    PHYSICAL EXAM: VS:  BP (!) 126/94   Pulse 68   Ht 5' 7.5" (1.715 m)   Wt 184 lb (83.5 kg)   BMI 28.39 kg/m  , BMI Body mass index is 28.39 kg/m. GEN: Well nourished, well developed, in no acute distress  HEENT: normal  Neck: no JVD, no masses. No carotid bruits Cardiac: RRR without murmur or gallop                Respiratory:  clear to auscultation bilaterally, normal work of breathing GI: soft, nontender, nondistended, + BS MS: no deformity or atrophy  Ext: no pretibial edema, pedal pulses 2+= bilaterally Skin: warm and dry, no rash Neuro:  Strength and sensation are intact Psych: euthymic mood, full affect  EKG:  EKG is ordered today. The ekg ordered today shows normal sinus rhythm 68 bpm, nonspecific T wave abnormality, no significant change from previous tracing.  Recent Labs: 07/19/2016: ALT 14; BUN 13.1; Creatinine 1.1; HGB 12.2; Platelets 176; Potassium 3.7; Sodium 142   Lipid Panel     Component Value Date/Time   CHOL 144 03/17/2014 0402   TRIG 136 03/17/2014 0402   HDL 39 (L) 03/17/2014 0402   CHOLHDL 3.7 03/17/2014 0402   VLDL 27 03/17/2014 0402   LDLCALC 78 03/17/2014 0402      Wt Readings from Last 3 Encounters:  08/07/16 184 lb (83.5 kg)  07/19/16 184 lb 11.2 oz (83.8 kg)  02/28/16 179 lb 3.2 oz (81.3 kg)     Cardiac Studies Reviewed: 2-D echocardiogram  08/24/2014: Study Conclusions  - Left ventricle: The cavity size was normal. Wall thickness was   normal. Systolic function was normal. The estimated ejection   fraction was in the range of 55% to 60%. - Left atrium: The atrium was mildly dilated.  ASSESSMENT AND PLAN: 1.  Prinzmetal angina: The patient is symptom-free. She is treated with a combination of amlodipine and isosorbide. No changes are recommended today. She continues on a statin drug.  2. Hyperlipidemia: Treated with atorvastatin. Lipids are followed by her primary physician.  Current medicines are reviewed with the patient today.  The patient does not have concerns regarding medicines.  Labs/ tests ordered today include:   Orders Placed This Encounter  Procedures  . EKG 12-Lead    Disposition:   FU one  year  Signed, Sherren Mocha, MD  08/07/2016 4:46 PM    [facility] [place], [place], [place]  [address] Phone: [phone]; Fax: [phone]

## 2016-08-19 IMAGING — DX DG CHEST 2V
2 series · 2 of 2 positions shown · non-contrast
Comparison: Chest radiograph 11/01/2014

CLINICAL DATA: Patient with cough and fever.

EXAM:
CHEST  2 VIEW

[chest pa]
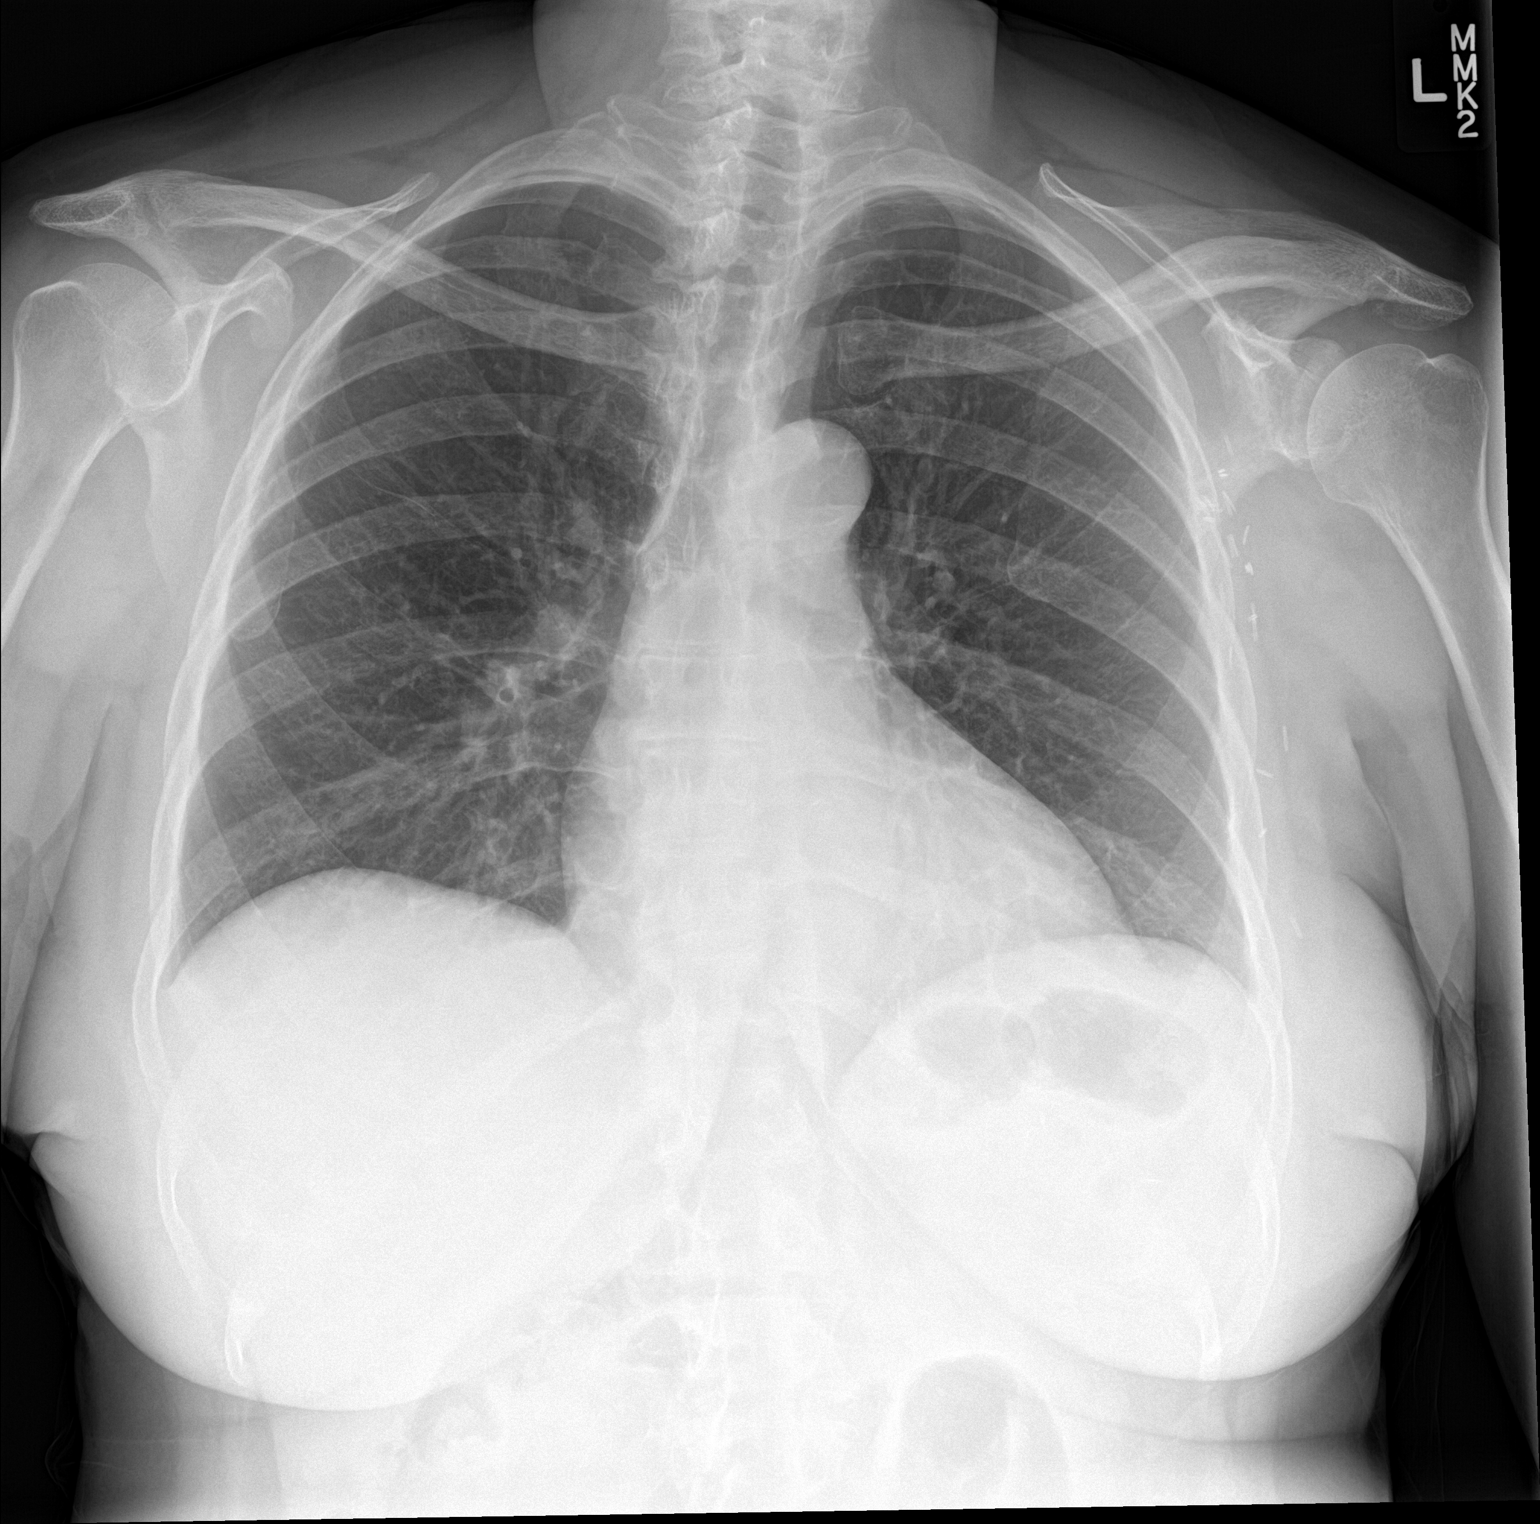

[chest lat]
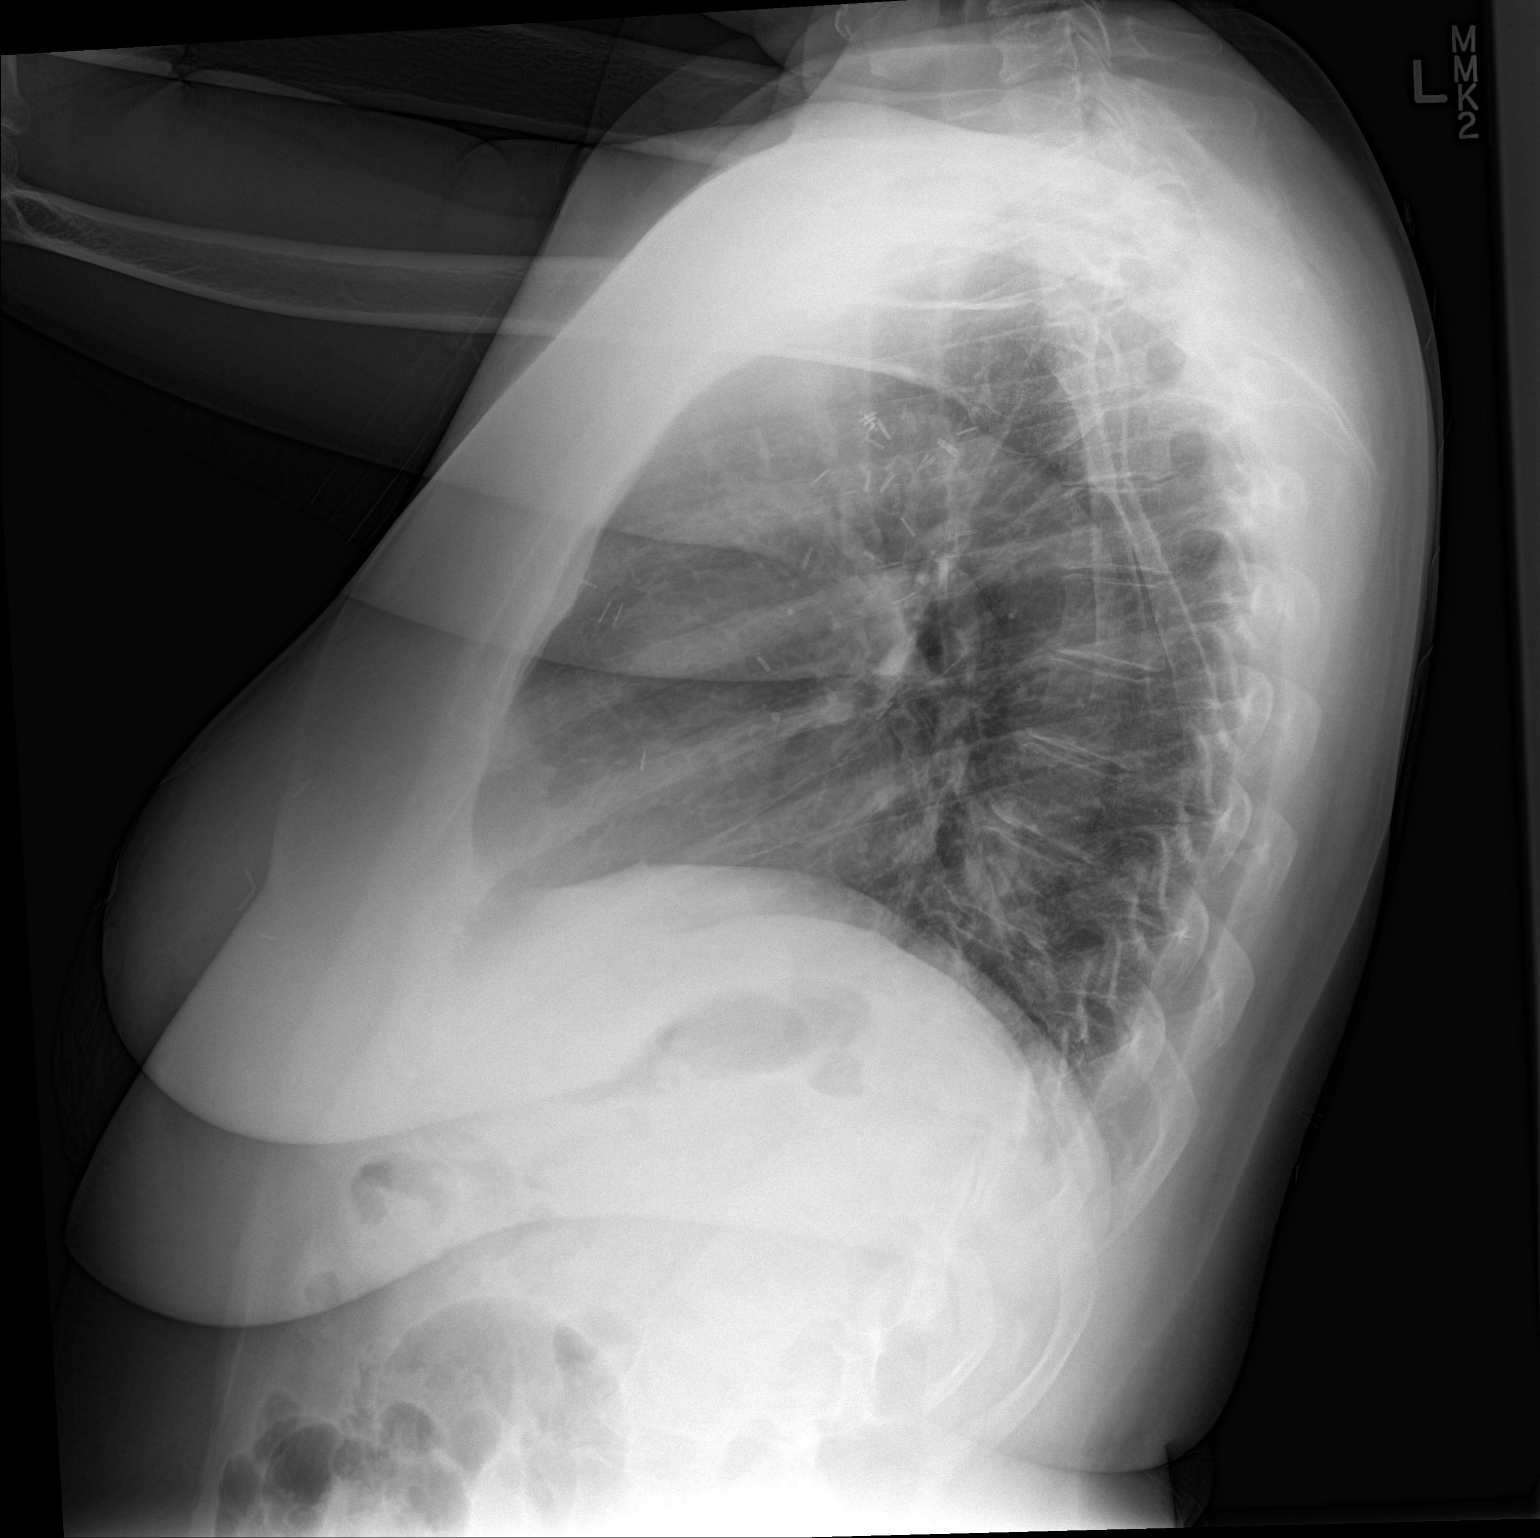

[2 of 2 positions shown; findings below may reference images not displayed]

FINDINGS: Stable cardiac and mediastinal contours. No consolidative pulmonary
opacities. No pleural effusion or pneumothorax. Mid thoracic spine
degenerative changes. Left chest wall surgical clips.
IMPRESSION: No active cardiopulmonary disease.

## 2016-08-20 ENCOUNTER — Other Ambulatory Visit: Payer: Self-pay | Admitting: Cardiovascular Disease

## 2016-09-10 ENCOUNTER — Other Ambulatory Visit: Payer: Self-pay | Admitting: Cardiovascular Disease

## 2016-09-18 ENCOUNTER — Other Ambulatory Visit: Payer: Self-pay | Admitting: Cardiovascular Disease

## 2016-09-18 ENCOUNTER — Ambulatory Visit
Admission: RE | Admit: 2016-09-18 | Discharge: 2016-09-18 | Disposition: A | Discharge status: Home / Self Care | Payer: Federal, State, Local not specified - PPO | Source: Ambulatory Visit | Attending: Hematology | Admitting: Hematology

## 2016-09-18 DIAGNOSIS — C50212 Malignant neoplasm of upper-inner quadrant of left female breast: Secondary | ICD-10-CM

## 2016-09-18 DIAGNOSIS — Z170 Estrogen receptor positive status [ER+]: Principal | ICD-10-CM

## 2016-09-18 DIAGNOSIS — R928 Other abnormal and inconclusive findings on diagnostic imaging of breast: Secondary | ICD-10-CM | POA: Diagnosis not present

## 2016-11-10 ENCOUNTER — Other Ambulatory Visit: Payer: Self-pay | Admitting: *Deleted

## 2016-11-10 MED ORDER — HYDROCHLOROTHIAZIDE 25 MG PO TABS: 25.0000 mg | ORAL_TABLET | Freq: Every day | ORAL | 2 refills | Status: DC

## 2017-01-07 ENCOUNTER — Telehealth: Payer: Self-pay | Admitting: Hematology

## 2017-01-07 NOTE — Telephone Encounter (Signed)
S/w pt advised appt [facility] from 1/18 to 1/15 @ 8.45am.

## 2017-01-10 ENCOUNTER — Ambulatory Visit: Payer: Self-pay

## 2017-01-10 ENCOUNTER — Other Ambulatory Visit: Payer: Self-pay | Admitting: Occupational Medicine

## 2017-01-10 DIAGNOSIS — M25561 Pain in right knee: Secondary | ICD-10-CM

## 2017-01-15 NOTE — Progress Notes (Signed)
[place]  [phone] Fax:([phone]) [phone]  Clinic follow up Note   Patient Care Team: Rankins, Bill Salinas, MD as PCP - General (Family Medicine) Jovita Kussmaul, MD as Consulting Physician (General Surgery) Sherren Mocha, MD as Consulting Physician (Cardiology)  01/16/2017  CHIEF COMPLAINTS:  Follow up left breast cancer   Oncology History   Breast cancer of upper-inner quadrant of left female breast [facility])   Staging form: Breast, AJCC 7th Edition     Clinical: Stage IIIA (T3, N1, M0) - Unsigned     Pathologic stage from 03/15/2015: Stage IIA (yT1c, N1a, cM0) - Signed by Truitt Merle, MD on 04/02/2015       Breast cancer of upper-inner quadrant of left female breast ([facility])   08/06/2014 Initial Diagnosis    Breast cancer of upper-inner quadrant of left female breast      08/06/2014 Initial Biopsy    Left breast 9:30 o'clock position showed invasive ductal carcinoma, and 1 left axillary lymph node biopsy showed positive for metastatic carcinoma.      08/06/2014 Receptors her2    ER 100% positive, PR 5% positive, HER2-, Ki-67 50%      08/06/2014 Mammogram    Diagnostic mammogram and ultrasound showed left breast mass with associated calcification measuring 5.5 cm, indeterminate a  left axillary lymph node was cortical thickening, circumscribed low density oval 55m mass at 12:00 within the left breast.      09/03/2014 - 01/26/2015 Neo-Adjuvant Chemotherapy    ddAC every 2 weeks X4, followed by weekly Taxol X12       09/23/2014 Procedure    Genetic testing: Negative. Genes evaluated: ATM, BARD1, BRCA1, BRCA2, BRIP1, CDH1, CHEK2, EPCAM, FANCC, MLH1, MSH2, MSH6, NBN, PALB2, PMS2, PTEN, RAD51C, RAD51D, TP53, & XRCC2.       03/15/2015 Surgery    left lumpectomy and axillary node dissection (Marlou Starks      03/15/2015 Pathology Results    left lumpectomy showed G2 invasive ductal carcinoma, 2cm, G2, and DCIS, G2. the lateral margin was positive for invasive tumor, 1  out of 15 nodes was positive.     ypT1c,ypN1a: Stage IIA       04/21/2015 Surgery    Re-excision for positive margin. Surgical path was negative for malignancy.      05/17/2015 - 06/28/2015 Radiation Therapy    adjuvant breast radiation (Isidore Moos. Left breast treated to 50 Gy in 25 fractions.  Left SCLV and PAB treated to 45 Gy in 25 fractions.  Left breast boost treated to 10 Gy in 5 fractions       07/12/2015 -  Anti-estrogen oral therapy    Letrozole 2.5 mg once daily. Planned duration of therapy: 10 years.       08/19/2015 Imaging    DEXA scan: Normal. (T-score -1.0)      08/24/2015 Mammogram    Diagnostic mammogram: No evidence for malignancy.       09/18/2016 Mammogram    IMPRESSION: Stable lumpectomy changes on the left. No evidence of malignancy bilaterally.       HISTORY OF PRESENTING ILLNESS:  Mary WOLMAN89y.o. female is here because of recently newly diagnosed left breast cancer. She presents to the clinic with her daughter.  This was discovered by screening mammogram. Her prior mammogram was 2 years ago. She noticed mild left nipple retraction, no palpable mass, no other changes. She feels well overall, denies any significant pain except mild right knee pain from arthritis. She denies any change of  her appetite, or recent weight loss. She works for post office, Horticulturist, commercial, and is physically active.   Her family history is notable for breast cancer in her brother, pancreatic cancer in another brother, and colon cancer in her mother. She is postmenopausal, last menstrual period 8 years ago. She has a daughter and a son. She lives with her brother, and her daughter is likely going to stay with her.  CURRENT TREATMENT:  Letrozole 2.5 mg once daily, started on 07/12/2015  INTERIM HISTORY  [name] returns for follow up. She notes she is doing well overall. She reports that her energy level has almost returned to normal. She notes to have mild neuropathy in her fingers  and moderate neuropathy in her feet. She states that it is worse at night. She is able to write normally. She is compliant with her Letrozole and has no complaints. She has lost some weight since completing chemo. She does not eat meat anymore and her exercise in daily walking at work.   Since last visit, pt had a mammogram on 09/18/16 that revealed no evidence of malignancy in either breast.   On review of systems, pt denies hot flashes, daily joint pain, or any other complaints at this time. Pertinent positives are listed and detailed within the above HPI.     MEDICAL HISTORY:  Past Medical History:  Diagnosis Date  . Angina effort ([place])    none recent  . Arthritis    "right knee" (03/16/2015)  . Breast cancer, left ([place]) dx'd 2016  . 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 Family history of breast cancer   . Family history of breast cancer in female   . Family history of pancreatic cancer   . Hx of radiation therapy 05/17/15- 06/28/15   Left Breast  . Hypertension   . NSTEMI (non-ST elevated myocardial infarction) ([place]) 03/16/2014   non-obs CAD, spasm seen  . Pneumonia 2009    SURGICAL HISTORY: Past Surgical History:  Procedure Laterality Date  . BREAST BIOPSY Left 08/06/14  . BREAST LUMPECTOMY Left 2017   chemo and radiation  . BREAST LUMPECTOMY WITH NEEDLE LOCALIZATION AND AXILLARY LYMPH NODE DISSECTION Left 03/15/2015   Procedure: LEFT BREAST LUMPECTOMY WITH BRACKETED NEEDLE LOCALIZATION AND AXILLARY LYMPH NODE DISSECTION;  Surgeon: Autumn Messing III, MD;  Location: [facility];  Service: General;  Laterality: Left;  . BUNIONECTOMY Bilateral   . CARDIAC CATHETERIZATION  2010; 03/16/2014  . ECTOPIC PREGNANCY SURGERY  1991   Tubal pregnancy  . LEFT HEART CATHETERIZATION WITH CORONARY ANGIOGRAM N/A 03/16/2014   Procedure: LEFT HEART CATHETERIZATION WITH CORONARY ANGIOGRAM;  Surgeon: Sherren Mocha, MD; Non-obs CAD, EF 55%  . PORT-A-CATH REMOVAL Right 03/15/2015    Procedure: REMOVAL PORT-A-CATH;  Surgeon: Autumn Messing III, MD;  Location: [facility];  Service: General;  Laterality: Right;  . PORTACATH PLACEMENT N/A 08/28/2014   Procedure: INSERTION PORT-A-CATH;  Surgeon: Autumn Messing III, MD;  Location: [facility];  Service: General;  Laterality: N/A;  . RE-EXCISION OF BREAST CANCER,SUPERIOR MARGINS Left 04/21/2015   Procedure: RE-EXCISION OF BREAST CANCER, INFERIOR MARGIN;  Surgeon: Autumn Messing III, MD;  Location: [facility];  Service: General;  Laterality: Left;  . TUBAL LIGATION  1981   GYN HISTORY  Menarchal: 15 LMP: 51 Contraceptive: 4 HRT: no G2P2: no breast feeding    SOCIAL HISTORY: Social History   Social History  . Marital Status: Legally Separated    Spouse Name: N/A  . Number  of Children: Daughter 17, son 18   . Years of Education: N/A   Occupational History  . Works for post office    Social History Main Topics  . Smoking status: Never Smoker   . Smokeless tobacco: Never Used  . Alcohol Use: No  . Drug Use: No  . Sexual Activity: No   Other Topics Concern  . Not on file   Social History Narrative    FAMILY HISTORY: Family History  Problem Relation Age of Onset  . Pancreatic cancer Brother 42  . Breast cancer Brother 24  . Hypertension Mother   . Diabetes type II Mother   . Colon cancer Mother 64  . CVA Father   . Healthy Sister   . Healthy Sister   . Breast cancer Cousin     ALLERGIES:  is allergic to carvedilol; codeine; percocet [oxycodone-acetaminophen]; adhesive [tape]; and compazine [prochlorperazine].  MEDICATIONS:  Current Outpatient Medications  Medication Sig Dispense Refill  . amLODipine (NORVASC) 10 MG tablet take 1 tablet by mouth once daily 90 tablet 3  . atorvastatin (LIPITOR) 40 MG tablet Take 1 tablet (40 mg total) by mouth daily at 6 PM. 90 tablet 3  . hydrochlorothiazide (HYDRODIURIL) 25 MG tablet Take 1 tablet (25 mg total) daily by mouth. 90 tablet 2  . irbesartan (AVAPRO) 300 MG tablet Take 1 tablet (300 mg  total) by mouth daily. 90 tablet 3  . isosorbide mononitrate (IMDUR) 30 MG 24 hr tablet take 1 tablet by mouth once daily 30 tablet 11  . letrozole (FEMARA) 2.5 MG tablet Take 1 tablet (2.5 mg total) by mouth daily. 90 tablet 3  . potassium chloride (K-DUR) 10 MEQ tablet Take 1 tablet (10 mEq total) by mouth every evening. 90 tablet 3  . nitroGLYCERIN (NITROSTAT) 0.4 MG SL tablet Place 1 tablet (0.4 mg total) under the tongue every 5 (five) minutes as needed for chest pain. (Patient not taking: Reported on 01/16/2017) 25 tablet 12   No current facility-administered medications for this visit.    REVIEW OF SYSTEMS:   Constitutional: Denies fevers, chills or abnormal night sweats, hot flashes Eyes: Denies blurriness of vision, double vision or watery eyes Ears, nose, mouth, throat, and face: Denies mucositis or sore throat Respiratory: Denies cough, dyspnea or wheezes Cardiovascular: Denies palpitation, chest discomfort or lower extremity swelling Gastrointestinal:  Denies nausea, heartburn or change in bowel habits Skin: Denies abnormal skin rashes Lymphatics: Denies new lymphadenopathy or easy bruising Neurological:Denies numbness, tingling or new weaknesses (+) tingling hands and feet Behavioral/Psych: Mood is stable, no new changes  Musculoskeletal: (+) left armpit pain, b/l shoulder pain, L arm weakness All other systems were reviewed with the patient and are negative.  PHYSICAL EXAMINATION: ECOG PERFORMANCE STATUS: 0  Vitals:   01/16/17 0856  BP: 109/78  Pulse: 76  Resp: 20  Temp: 98.7 F (37.1 C)  SpO2: 98%   Filed Weights   01/16/17 0856  Weight: 163 lb 14.4 oz (74.3 kg)     GENERAL:alert, no distress and comfortable SKIN: skin color, texture, turgor are normal, no rashes or significant lesions, (+) skin pigmentation nails, no significant nail thickening  EYES: normal, conjunctiva are pink and non-injected, sclera clear OROPHARYNX:no exudate, no erythema and lips,  buccal mucosa, and tongue normal  NECK: supple, thyroid normal size, non-tender, without nodularity LYMPH:  no palpable lymphadenopathy in the cervical, axillary or inguinal LUNGS: clear to auscultation and percussion with normal breathing effort HEART: regular rate & rhythm and no murmurs  and no lower extremity edema ABDOMEN:abdomen soft, non-tender and normal bowel sounds Musculoskeletal:no cyanosis of digits and no clubbing, (+) right flank tenderness PSYCH: alert & oriented x 3 with fluent speech NEURO: no focal motor/sensory deficits Breasts: Breast inspection showed them to be symmetrical with no nipple discharge. The surgical incision in the left breast and axilla are well healed, Moderate hyperpigmentation from radiation and mild Lymphedema of left breast, no skin ulcer or peeling. Exam of both breasts and axilla revealed no evidence of mass or adenopathy.   LABORATORY DATA:  I have reviewed the data as listed CBC Latest Ref Rng & Units 01/16/2017 07/19/2016 02/28/2016  WBC 3.9 - 10.3 K/uL 3.8(L) 4.4 3.3(L)  Hemoglobin 11.6 - 15.9 g/dL 12.1 12.2 13.0  Hematocrit 34.8 - 46.6 % 37.3 37.7 39.8  Platelets 145 - 400 K/uL 231 176 190    CMP Latest Ref Rng & Units 07/19/2016 02/28/2016 11/15/2015  Glucose 70 - 140 mg/dl 112 101 106  BUN 7.0 - 26.0 mg/dL 13.1 11.8 13.1  Creatinine 0.6 - 1.1 mg/dL 1.1 1.1 1.0  Sodium 136 - 145 mEq/L 142 142 142  Potassium 3.5 - 5.1 mEq/L 3.7 3.8 3.7  Chloride 101 - 111 mmol/L - - -  CO2 22 - 29 mEq/L _0 Calcium 8.4 - 10.4 mg/dL 10.9(H) 11.1(H) 10.7(H)  Total Protein 6.4 - 8.3 g/dL 6.8 7.4 7.4  Total Bilirubin 0.20 - 1.20 mg/dL 0.65 0.62 0.70  Alkaline Phos 40 - 150 U/L 109 105 107  AST 5 - 34 U/L _1 ALT 0 - 55 U/L 14 23 38    PATHOLOGY REPORT: Diagnosis 03/15/2015 1. 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 - INTERMEDIATE GRADE DUCTAL CARCINOMA IN SITU. - DUCTAL CARCINOMA IN  SITU IS FOCALLY 0.1 TO 0.2 CM TO SUPERIOR MARGIN. - DUCTAL CARCINOMA IN SITU IS FOCALLY 0.2 CM TO LATERAL MARGIN. - INVASIVE DUCTAL CARCINOMA INVOLVES LATERAL MARGIN. - PLEASE CORRELATE WITH LOCATION OF SPECIMENS NUMBER 2 (ADDITIONAL SUPERIOR MARGIN) AND NUMBER 3 (ADDITIONAL LATERAL MARGIN) FOR FINAL MARGIN STATUS. - INVASIVE DUCTAL CARCINOMA FOCALLY INVOLVES INFERIOR MARGIN. - OTHER MARGINS ARE NEGATIVE. - SEE ONCOLOGY TEMPLATE. 2. Breast, excision, Left additional superior margin - FOCAL INTERMEDIATE DUCTAL CARCINOMA IN SITU. - MARGIN IS NEGATIVE (TUMOR IS GREATER THAN 0.4 CM TO MARGIN). 3. Breast, excision, Left additional lateral margin - BENIGN BREAST PARENCHYMA WITH FIBROCYSTIC CHANGES. - NO TUMOR SEEN. 4. 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 Microscopic Comment 1. BREAST, INVASIVE TUMOR, WITH LYMPH NODES PRESENT Specimen, including laterality and lymph node sampling (sentinel, non-sentinel): Left partial breast with left additional superior margin and left additional lateral margin; left non-sentinel lymph nodes. Procedure: Left breast lumpectomy with additional left superior margin and additional left lateral margin excisions; left axillary lymph node dissection. Histologic type: Invasive ductal carcinoma. Grade: Tubule formation: 3. Nuclear pleomorphism: 2. Mitotic:1. Tumor size (gross measurement): 2 cm. Margins: Invasive, distance to closest margin: Invasive ductal carcinoma involves lateral margin on initial lumpectomy specimen but additional lateral margin (specimen number 3) is negative for invasive ductal carcinoma; invasive ductal carcinoma focally involves inferior margin. In-situ, distance to closest margin: Ductal carcinoma in situ is focally 0.1 to 0.2 cm to superior margin on initial lumpectomy specimen but is at least 0.4 cm away from additional superior margin (see  specimen #4).. If margin positive, focally or broadly: Initial  breast lumpectomy demonstrates fairly broad involvement of the lateral margin; however, the additional lateral margin excision is negative for invasive ductal carcinoma; invasive ductal carcinoma focally involves the inferior margin. Lymphovascular invasion: Definitive lymphovascular invasion is not identified; however, a lymph node is positive for metastatic ductal carcinoma, see below. Ductal carcinoma in situ: Yes. Grade: Intermediate grade. Extensive intraductal component: No. Lobular neoplasia: Not identified. Tumor focality: Unifocal. Treatment effect: Some degree of treatment effect is present in the breast tissue. If present, treatment effect in breast tissue, lymph nodes or both: Treatment effect is only seen in breast tissue. Extent of tumor: Tumor present in breast tissue. Skin: Not received. Nipple: Not received. Skeletal muscle: Not received. Lymph nodes: Examined: 0 Sentinel. 15 Non-sentinel. 15 Total. Lymph nodes with metastasis: 1. Isolated tumor cells (< 0.2 mm): 0.  Microscopic Comment(continued) Micrometastasis: (> 0.2 mm and < 2.0 mm): 0. Macrometastasis: (> 2.0 mm): 1. Extracapsular extension: Not identified. Breast prognostic profile: Performed on previous case, [phone] Estrogen receptor: 100%, positive. Progesterone receptor: 5%, positive. Her-2 neu: 1.47 ratio, negative. Ki-67: 50%. Non-neoplastic breast: Fibrocystic changes. TNM: ypT1c, ypN1a. Comments: As Her-2 neu was previously negative, this will be repeated on representative current tumor and reported in an addendum to follow. Dr. Lyndon Code has seen selected slides (1A, 1G and 2I) in consultation with agreement that invasive ductal carcinoma involves the lateral margin and focally involves the inferior margin and that ductal carcinoma in situ is focally 0.1 to 0.2 cm to superior margin. He is also in agreement that the findings in  slide 2I represent focal ductal carcinoma in situ. (RH:ecj 03/17/2015)  Results: HER2 - NEGATIVE RATIO OF HER2/CEP17 SIGNALS 1.62 AVERAGE HER2 COPY NUMBER PER CELL 2.35  Diagnosis 04/21/2015 Breast, excision, left inferior margin BREAST TISSUE WITH PREVIOUS RESECTION SITE CHANGES NEGATIVE FOR MALIGNANCY   RADIOGRAPHIC STUDIES: I have personally reviewed the radiological images as listed and agreed with the findings in the report.  Diagnostic Mammogram 08/24/2015 IMPRESSION: No mammographic evidence for malignancy.  DEXA Scan 08/19/2015 The BMD measured at Femur Total Left is 0.879 g/cm2 with a T-score of -1.0. This patient is considered normal according to [facility]) criteria. This patient does not meet criteria for FRAX assessment.  Site Region Measured Date Measured Age YA BMD Significant CHANGE T-score  DualFemur Total Left 08/19/2015 59.6 -1.1 0.879 g/cm2  AP Spine L1-L4 08/19/2015 59.6 -0.1 1.183 g/cm2  Left Forearm Radius 33% 08/19/2015 59.6 -0.3 0.859 g/cm2  World Health Organization [facility]) criteria for post-menopausal, Caucasian Women: Normal       T-score at or above -1 SD Osteopenia   T-score between -1 and -2.5 SD Osteoporosis T-score at or below -2.5 SD  Echo 08/24/2014 Study Conclusions  - Left ventricle: The cavity size was normal. Wall thickness was normal. Systolic function was normal. The estimated ejection fraction was in the range of 55% to 60%. - Left atrium: The atrium was mildly dilated.  MR breast b/l w wo contrast 02/01/2015  IMPRESSION: Interval decrease in size of retroareolar mass within the left    breast with decreased conspicuity of previously described non mass enhancement extending from the posterior margin of the mass. Interval decrease in cortical thickening of previously described left axillary lymph nodes.  Diagnostic Mammogram 08/24/15 IMPRESSION: No mammographic evidence for malignancy.  Diagnostic  Mammogram 09/18/16 IMPRESSION: Stable lumpectomy changes on the left. No evidence of malignancy bilaterally.  ASSESSMENT & PLAN:   62 y.o. African-American female, postmenopausal, healthy and fit, who was found  to have a left breast mass and a biopsy confirmed node metastasis by screening mammogram.  1. Left breast invasive ductal carcinoma, cT3N1M0, stage IIIA, ER+/PR+/HER2-, ypT1cN1aM0 after neoadjuvant chemotherapy -I previously reviewed her surgical pathology findings with her and her daughter in great details. She had a partial response to neoadjuvant chemotherapy, tumor size has significantly decreased, however she still has 1 positive axilla node. -She had a reexcision for positive margins, final path was negative for malignant cells. - we again discussed the risk of cancer recurrence after her surgery,  Due to the locally advanced stage, persistent node-positive disease after neoadjuvant chemotherapy, she certainly has high risk for recurrence. -She has started adjuvant letrozole, tolerating well so far. We'll continue for 7-10 years if she is able tolerate -will continue breast cancer surveillance plan. She'll continue annual mammogram, follow-up regularly with lab and exam. -She is clinically doing well, lab results reviewed with her, exam was unremarkable. Her last mammogram in August 2017 was normal. No clinical concern for recurrence. -Pt's mammogram on 09/18/16 reveled stable lumpectomy changes on the left and no evidence of malignancy bilaterally. Her exam today was unremarkable. Labs on 01/16/17 are WNL. -She will follow up with Dr. Marlou Starks in 4-5 months -Will f/u in 8 months    2. Bone health -A bone density scan from August 2017 was normal, T score -1.0. She will repeat in 2 years, 2019 -She has hypercalcemia, not on calcium. -I previously encouraged her to exercise, especially weight-bearing exercise and to drink more water.  -I recommended that she try a Vitamin D supplement or  a multivitamin.   3. Cancer genetics -She has very strong family history of breast, colon and pancreatic cancer, all in first-degree, I recommend her to see genetic counselor to ruled out inheritable breast cancer syndrome.  -her genetic testing result was normal  4. History of non-STEMI in 03/2014 -She had cardiac catheterization, which showed mild diffuse coronary artery disease, but no need intervention.  -She will follow-up with her cardiologist Dr. Burt Knack -We again discussed some of the chemotherapy drug may cause heart failure, we'll repeat her echo after chemo, she may need follow-up with Dr. Burt Knack during her chemotherapy.   5.Peripheral neuropathy, G1 -Secondary to chemotherapy, overall much improved. - she will continue Neurontin -I have recommended B12.  -overall mild and stable   6. Hypercalcemia -Her calcium has been around 11 -Her vitamin D level is low, PTH was normal. -I encouraged her to follow-up with her primary care physician Dr. Radene Ou. I'll copy my note to Dr. Radene Ou    Plan -continue letrozole -I'll see her back in 8 months with lab -She is due for next screening mammogram and DEXA in Sept 2019, ordered tioday  -She will follow up with her surgeon, Dr. Marlou Starks in 4-5 months   All questions were answered. The patient knows to call the clinic with any problems, questions or concerns.  I spent 20 minutes counseling the patient face to face. The total time spent in the appointment was 25 minutes and more than 50% was on counseling.  This document serves as a record of services personally performed by Truitt Merle, MD. It was created on her behalf by Theresia Bough, a trained medical scribe. The creation of this record is based on the scribe's personal observations and the provider's statements to them.   .yrs     Truitt Merle, MD 01/16/2017

## 2017-01-16 ENCOUNTER — Encounter: Payer: Self-pay | Admitting: Hematology

## 2017-01-16 ENCOUNTER — Inpatient Hospital Stay (HOSPITAL_BASED_OUTPATIENT_CLINIC_OR_DEPARTMENT_OTHER): Payer: Federal, State, Local not specified - PPO | Admitting: Hematology

## 2017-01-16 ENCOUNTER — Telehealth: Payer: Self-pay | Admitting: Hematology

## 2017-01-16 ENCOUNTER — Inpatient Hospital Stay: Payer: Federal, State, Local not specified - PPO | Attending: Hematology

## 2017-01-16 VITALS — BP 109/78 | HR 76 | Temp 98.7°F | Resp 20 | Wt 163.9 lb

## 2017-01-16 DIAGNOSIS — I252 Old myocardial infarction: Secondary | ICD-10-CM

## 2017-01-16 DIAGNOSIS — C50212 Malignant neoplasm of upper-inner quadrant of left female breast: Secondary | ICD-10-CM

## 2017-01-16 DIAGNOSIS — Z79811 Long term (current) use of aromatase inhibitors: Secondary | ICD-10-CM | POA: Insufficient documentation

## 2017-01-16 DIAGNOSIS — Z803 Family history of malignant neoplasm of breast: Secondary | ICD-10-CM

## 2017-01-16 DIAGNOSIS — Z9221 Personal history of antineoplastic chemotherapy: Secondary | ICD-10-CM | POA: Diagnosis not present

## 2017-01-16 DIAGNOSIS — C50912 Malignant neoplasm of unspecified site of left female breast: Secondary | ICD-10-CM

## 2017-01-16 DIAGNOSIS — Z800 Family history of malignant neoplasm of digestive organs: Secondary | ICD-10-CM | POA: Diagnosis not present

## 2017-01-16 DIAGNOSIS — Z170 Estrogen receptor positive status [ER+]: Secondary | ICD-10-CM | POA: Diagnosis not present

## 2017-01-16 DIAGNOSIS — G620 Drug-induced polyneuropathy: Secondary | ICD-10-CM | POA: Insufficient documentation

## 2017-01-16 DIAGNOSIS — E2839 Other primary ovarian failure: Secondary | ICD-10-CM

## 2017-01-16 LAB — CBC WITH DIFFERENTIAL/PLATELET
Basophils Absolute: 0 10*3/uL (ref 0.0–0.1)
Basophils Relative: 1 %
EOS ABS: 0.1 10*3/uL (ref 0.0–0.5)
Eosinophils Relative: 2 %
HEMATOCRIT: 37.3 % (ref 34.8–46.6)
HEMOGLOBIN: 12.1 g/dL (ref 11.6–15.9)
LYMPHS ABS: 1.3 10*3/uL (ref 0.9–3.3)
Lymphocytes Relative: 35 %
MCH: 29.4 pg (ref 25.1–34.0)
MCHC: 32.5 g/dL (ref 31.5–36.0)
MCV: 90.5 fL (ref 79.5–101.0)
Monocytes Absolute: 0.4 10*3/uL (ref 0.1–0.9)
Monocytes Relative: 9 %
NEUTROS ABS: 2 10*3/uL (ref 1.5–6.5)
NEUTROS PCT: 53 %
Platelets: 231 10*3/uL (ref 145–400)
RBC: 4.12 MIL/uL (ref 3.70–5.45)
RDW: 15.7 % (ref 11.2–16.1)
WBC: 3.8 10*3/uL — AB (ref 3.9–10.3)

## 2017-01-16 LAB — COMPREHENSIVE METABOLIC PANEL
ALT: 13 U/L (ref 0–55)
ANION GAP: 10 (ref 3–11)
AST: 16 U/L (ref 5–34)
Albumin: 4 g/dL (ref 3.5–5.0)
Alkaline Phosphatase: 88 U/L (ref 40–150)
BUN: 13 mg/dL (ref 7–26)
CHLORIDE: 105 mmol/L (ref 98–109)
CO2: 26 mmol/L (ref 22–29)
CREATININE: 1.04 mg/dL (ref 0.60–1.10)
Calcium: 10.6 mg/dL — ABNORMAL HIGH (ref 8.4–10.4)
GFR, EST NON AFRICAN AMERICAN: 57 mL/min — AB (ref >60)
Glucose, Bld: 94 mg/dL (ref 70–140)
Potassium: 4.1 mmol/L (ref 3.3–4.7)
Sodium: 141 mmol/L (ref 136–145)
Total Bilirubin: 0.7 mg/dL (ref 0.2–1.2)
Total Protein: 7 g/dL (ref 6.4–8.3)

## 2017-01-16 NOTE — Telephone Encounter (Signed)
Scheduled appt per 1/15 los - Gave patient AVS and calender per los. Lab and f.u in 8 months.

## 2017-01-19 ENCOUNTER — Other Ambulatory Visit: Payer: Federal, State, Local not specified - PPO

## 2017-01-19 ENCOUNTER — Ambulatory Visit: Payer: Federal, State, Local not specified - PPO | Admitting: Hematology

## 2017-01-19 ENCOUNTER — Telehealth: Payer: Self-pay | Admitting: *Deleted

## 2017-01-19 NOTE — Telephone Encounter (Signed)
-----   Message from Truitt Merle, MD sent at 01/19/2017  2:54 PM EST ----- Please let her know the CMP results. She still has mildly elevated calcium, please avoid dairy products, no calcium and vit D supplement, and f/u with PCP. Thanks  Truitt Merle  01/19/2017

## 2017-01-19 NOTE — Telephone Encounter (Signed)
Called pt & informed of slightly elevated calcium & to avoid dairy products, no calcium & vit D supplements & f/u with PCP.  Pt expressed understanding.

## 2017-02-07 DIAGNOSIS — Z853 Personal history of malignant neoplasm of breast: Secondary | ICD-10-CM | POA: Diagnosis not present

## 2017-02-07 DIAGNOSIS — E7800 Pure hypercholesterolemia, unspecified: Secondary | ICD-10-CM | POA: Diagnosis not present

## 2017-02-07 DIAGNOSIS — I10 Essential (primary) hypertension: Secondary | ICD-10-CM | POA: Diagnosis not present

## 2017-03-05 DIAGNOSIS — H35033 Hypertensive retinopathy, bilateral: Secondary | ICD-10-CM | POA: Diagnosis not present

## 2017-05-10 ENCOUNTER — Telehealth: Payer: Self-pay | Admitting: Cardiovascular Disease

## 2017-05-10 NOTE — Telephone Encounter (Signed)
New message  Pt verbalzied that she is calling for RN  For an appt per recall with Dr.Cooper  Aug appointment dates that pt want are:   1st   9th   19th  27th

## 2017-05-10 NOTE — Telephone Encounter (Signed)
I spoke with pt and told her I would forward this phone note to Dr. Antionette Char nurse and she would call her back regarding appointment

## 2017-05-11 NOTE — Telephone Encounter (Signed)
Patient has confirmed appointment with Scheduling.

## 2017-05-11 NOTE — Telephone Encounter (Signed)
Per DPR form, left detailed message for patient to call back to confirm or cancel appointment scheduled 8/19 at 1:40PM.

## 2017-08-14 ENCOUNTER — Other Ambulatory Visit: Payer: Self-pay | Admitting: Hematology

## 2017-08-14 ENCOUNTER — Other Ambulatory Visit: Payer: Self-pay | Admitting: Cardiovascular Disease

## 2017-08-14 DIAGNOSIS — C50212 Malignant neoplasm of upper-inner quadrant of left female breast: Secondary | ICD-10-CM

## 2017-08-14 DIAGNOSIS — Z170 Estrogen receptor positive status [ER+]: Principal | ICD-10-CM

## 2017-08-20 ENCOUNTER — Ambulatory Visit: Payer: Federal, State, Local not specified - PPO | Admitting: Cardiovascular Disease

## 2017-08-20 ENCOUNTER — Encounter: Payer: Self-pay | Admitting: Cardiovascular Disease

## 2017-08-20 VITALS — BP 104/70 | HR 67 | Ht 67.5 in | Wt 169.2 lb

## 2017-08-20 DIAGNOSIS — I201 Angina pectoris with documented spasm: Secondary | ICD-10-CM

## 2017-08-20 MED ORDER — AMLODIPINE BESYLATE 5 MG PO TABS: 5.0000 mg | ORAL_TABLET | Freq: Every day | ORAL | 3 refills | Status: DC

## 2017-08-20 NOTE — Progress Notes (Signed)
Cardiology Office Note Date:  08/20/2017   ID:  Mary Randall, DOB 10-24-1955, MRN [id number]  PCP:  Aretta Nip, MD  Cardiologist:  Sherren Mocha, MD    Chief Complaint  Patient presents with  . Dizziness   History of Present Illness: Mary Randall is a 62 y.o. female who presents for follow-up of Prinzmetal angina.  Patient presented with chest pain and marked EKG changes in 2016, evaluated with cardiac catheterization demonstrating mild diffuse nonobstructive coronary artery disease with normal LV function.  She is been treated with isosorbide and amlodipine for antianginal therapy as well as aspirin and atorvastatin.  The patient has had no recent problems with chest pain, shortness of breath, or leg swelling.  She is been treated with chemotherapy for breast cancer in his had some difficulty with numbness in her toes and feet since treatment.  She also has had periodic dizziness.  She experienced this yesterday in church when she was standing up and singing.  She has not had frank syncope.  She is been drinking plenty of fluids.  Past Medical History:  Diagnosis Date  . Angina effort    none recent  . Arthritis    "right knee" (03/16/2015)  . Breast cancer, left ([name]) dx'd 2016  . 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 Family history of breast cancer   . Family history of breast cancer in female   . Family history of pancreatic cancer   . Hx of radiation therapy 05/17/15- 06/28/15   Left Breast  . Hypertension   . NSTEMI (non-ST elevated myocardial infarction) ([facility]) 03/16/2014   non-obs CAD, spasm seen  . Pneumonia 2009    Past Surgical History:  Procedure Laterality Date  . BREAST BIOPSY Left 08/06/14  . BREAST LUMPECTOMY Left 2017   chemo and radiation  . BREAST LUMPECTOMY WITH NEEDLE LOCALIZATION AND AXILLARY LYMPH NODE DISSECTION Left 03/15/2015   Procedure: LEFT BREAST LUMPECTOMY WITH BRACKETED NEEDLE LOCALIZATION AND AXILLARY LYMPH NODE  DISSECTION;  Surgeon: Autumn Messing III, MD;  Location: [name];  Service: General;  Laterality: Left;  . BUNIONECTOMY Bilateral   . CARDIAC CATHETERIZATION  2010; 03/16/2014  . ECTOPIC PREGNANCY SURGERY  1991   Tubal pregnancy  . LEFT HEART CATHETERIZATION WITH CORONARY ANGIOGRAM N/A 03/16/2014   Procedure: LEFT HEART CATHETERIZATION WITH CORONARY ANGIOGRAM;  Surgeon: Sherren Mocha, MD; Non-obs CAD, EF 55%  . PORT-A-CATH REMOVAL Right 03/15/2015   Procedure: REMOVAL PORT-A-CATH;  Surgeon: Autumn Messing III, MD;  Location: [facility];  Service: General;  Laterality: Right;  . PORTACATH PLACEMENT N/A 08/28/2014   Procedure: INSERTION PORT-A-CATH;  Surgeon: Autumn Messing III, MD;  Location: [facility];  Service: General;  Laterality: N/A;  . RE-EXCISION OF BREAST CANCER,SUPERIOR MARGINS Left 04/21/2015   Procedure: RE-EXCISION OF BREAST CANCER, INFERIOR MARGIN;  Surgeon: Autumn Messing III, MD;  Location: [facility];  Service: General;  Laterality: Left;  . TUBAL LIGATION  1981    Current Outpatient Medications  Medication Sig Dispense Refill  . amLODipine (NORVASC) 10 MG tablet TAKE 1 TABLET BY MOUTH ONCE DAILY 90 tablet 0  . atorvastatin (LIPITOR) 40 MG tablet Take 1 tablet (40 mg total) by mouth daily at 6 PM. 90 tablet 3  . hydrochlorothiazide (HYDRODIURIL) 25 MG tablet TAKE 1 TABLET BY MOUTH ONCE DAILY 90 tablet 0  . irbesartan (AVAPRO) 300 MG tablet Take 1 tablet (300 mg total) by mouth daily. 90 tablet 3  . isosorbide mononitrate (IMDUR)  30 MG 24 hr tablet take 1 tablet by mouth once daily 30 tablet 11  . letrozole (FEMARA) 2.5 MG tablet TAKE 1 TABLET BY MOUTH ONCE DAILY 90 tablet 0  . nitroGLYCERIN (NITROSTAT) 0.4 MG SL tablet Place 1 tablet (0.4 mg total) under the tongue every 5 (five) minutes as needed for chest pain. 25 tablet 12  . potassium chloride (K-DUR) 10 MEQ tablet Take 1 tablet (10 mEq total) by mouth every evening. 90 tablet 3   No current facility-administered medications for this visit.     Allergies:    Carvedilol; Codeine; Percocet [oxycodone-acetaminophen]; Adhesive [tape]; and Compazine [prochlorperazine]   Social History:  The patient  reports that she has never smoked. She has never used smokeless tobacco. She reports that she does not drink alcohol or use drugs.   Family History:  The patient's family history includes Breast cancer in her cousin; Breast cancer (age of onset: 45) in her brother; CVA in her father; Colon cancer (age of onset: 54) in her mother; Diabetes type II in her mother; Healthy in her sister and sister; Hypertension in her mother; Pancreatic cancer (age of onset: 75) in her brother.    ROS:  Please see the history of present illness. All other systems are reviewed and negative.    PHYSICAL EXAM: VS:  BP 104/70   Pulse 67   Ht 5' 7.5" (1.715 m)   Wt 169 lb 3.2 oz (76.7 kg)   SpO2 96%   BMI 26.11 kg/m  , BMI Body mass index is 26.11 kg/m. GEN: Well nourished, well developed, in no acute distress  HEENT: normal  Neck: no JVD, no masses. No carotid bruits Cardiac: RRR without murmur or gallop                Respiratory:  clear to auscultation bilaterally, normal work of breathing GI: soft, nontender, nondistended, + BS MS: no deformity or atrophy  Ext: trace bilateral pretibial edema, pedal pulses 2+= bilaterally Skin: warm and dry, no rash Neuro:  Strength and sensation are intact Psych: euthymic mood, full affect  EKG:  EKG is ordered today. The ekg ordered today shows normal sinus rhythm 67 bpm, nonspecific T wave abnormality.  Recent Labs: 01/16/2017: ALT 13; BUN 13; Creatinine, Ser 1.04; Hemoglobin 12.1; Platelets 231; Potassium 4.1; Sodium 141   Lipid Panel     Component Value Date/Time   CHOL 144 03/17/2014 0402   TRIG 136 03/17/2014 0402   HDL 39 (L) 03/17/2014 0402   CHOLHDL 3.7 03/17/2014 0402   VLDL 27 03/17/2014 0402   LDLCALC 78 03/17/2014 0402      Wt Readings from Last 3 Encounters:  08/20/17 169 lb 3.2 oz (76.7 kg)  01/16/17  163 lb 14.4 oz (74.3 kg)  08/07/16 184 lb (83.5 kg)     ASSESSMENT AND PLAN: 1.  Prinzmetal angina: The patient is stable with no symptoms.  Her antianginal treatment includes amlodipine and isosorbide.  2.  Hyperlipidemia: Treated with atorvastatin.  Followed by her primary physician.  3.  Hypertension: Reviewed with the trend of her weights and she is down about 15 pounds from 1 year ago.  Suspect she is now overtreated for hypertension.  She is treated with amlodipine, Avapro, isosorbide, and hydrochlorothiazide.  Advised her to reduce amlodipine to 5 mg daily and follow symptoms.    Current medicines are reviewed with the patient today.  The patient does not have concerns regarding medicines.  Labs/ tests ordered today include:  No  orders of the defined types were placed in this encounter.  Disposition:   FU one year  Signed, Sherren Mocha, MD  08/20/2017 2:10 PM    [facility] [place], [place],   [address] Phone: [phone]; Fax: [phone]

## 2017-08-20 NOTE — Patient Instructions (Signed)
Medication Instructions:  1) DECREASE AMLODIPINE (Norvasc) to 5 mg daily  Labwork: None  Testing/Procedures: None  Follow-Up: Your provider wants you to follow-up in: 1 year with Dr. Burt Knack. You will receive a reminder letter in the mail two months in advance. If you don't receive a letter, please call our office to schedule the follow-up appointment.    Any Other Special Instructions Will Be Listed Below (If Applicable).     If you need a refill on your cardiac medications before your next appointment, please call your pharmacy.

## 2017-09-12 ENCOUNTER — Telehealth: Payer: Self-pay | Admitting: Hematology

## 2017-09-12 NOTE — Telephone Encounter (Signed)
YF PAL 9/18 - moved 9/18 appointments to 10/16. Spoke with patient.

## 2017-09-19 ENCOUNTER — Ambulatory Visit: Payer: Self-pay | Admitting: Hematology

## 2017-09-19 ENCOUNTER — Other Ambulatory Visit: Payer: Self-pay

## 2017-09-20 ENCOUNTER — Ambulatory Visit
Admission: RE | Admit: 2017-09-20 | Discharge: 2017-09-20 | Disposition: A | Discharge status: Home / Self Care | Payer: Federal, State, Local not specified - PPO | Source: Ambulatory Visit | Attending: Hematology | Admitting: Hematology

## 2017-09-20 DIAGNOSIS — R922 Inconclusive mammogram: Secondary | ICD-10-CM | POA: Diagnosis not present

## 2017-09-20 DIAGNOSIS — Z170 Estrogen receptor positive status [ER+]: Principal | ICD-10-CM

## 2017-09-20 DIAGNOSIS — C50212 Malignant neoplasm of upper-inner quadrant of left female breast: Secondary | ICD-10-CM

## 2017-09-21 ENCOUNTER — Other Ambulatory Visit: Payer: Self-pay

## 2017-10-02 ENCOUNTER — Other Ambulatory Visit: Payer: Self-pay | Admitting: Cardiovascular Disease

## 2017-10-15 NOTE — Progress Notes (Signed)
[place]  [phone] [phone]  Clinic follow up Note   Patient Care Team: Rankins, Bill Salinas, MD as PCP - General (Family Medicine) Sherren Mocha, MD as PCP - Cardiology (Cardiology) Jovita Kussmaul, MD as Consulting Physician (General Surgery)  10/17/2017  CHIEF COMPLAINTS:  Follow up left breast cancer   Oncology History   Breast cancer of upper-inner quadrant of left female breast [facility])   Staging form: Breast, AJCC 7th Edition     Clinical: Stage IIIA (T3, N1, M0) - Unsigned     Pathologic stage from 03/15/2015: Stage IIA (yT1c, N1a, cM0) - Signed by Truitt Merle, MD on 04/02/2015       Breast cancer of upper-inner quadrant of left female breast ([place])   08/06/2014 Initial Diagnosis    Breast cancer of upper-inner quadrant of left female breast    08/06/2014 Initial Biopsy    Left breast 9:30 o'clock position showed invasive ductal carcinoma, and 1 left axillary lymph node biopsy showed positive for metastatic carcinoma.    08/06/2014 Receptors her2    ER 100% positive, PR 5% positive, HER2-, Ki-67 50%    08/06/2014 Mammogram    Diagnostic mammogram and ultrasound showed left breast mass with associated calcification measuring 5.5 cm, indeterminate a  left axillary lymph node was cortical thickening, circumscribed low density oval 42m mass at 12:00 within the left breast.    09/03/2014 - 01/26/2015 Neo-Adjuvant Chemotherapy    ddAC every 2 weeks X4, followed by weekly Taxol X12     09/23/2014 Procedure    Genetic testing: Negative. Genes evaluated: ATM, BARD1, BRCA1, BRCA2, BRIP1, CDH1, CHEK2, EPCAM, FANCC, MLH1, MSH2, MSH6, NBN, PALB2, PMS2, PTEN, RAD51C, RAD51D, TP53, & XRCC2.     03/15/2015 Surgery    left lumpectomy and axillary node dissection (Marlou Starks    03/15/2015 Pathology Results    left lumpectomy showed G2 invasive ductal carcinoma, 2cm, G2, and DCIS, G2. the lateral margin was positive for invasive tumor, 1 out of 15 nodes was  positive.     ypT1c,ypN1a: Stage IIA     04/21/2015 Surgery    Re-excision for positive margin. Surgical path was negative for malignancy.    05/17/2015 - 06/28/2015 Radiation Therapy    adjuvant breast radiation (Isidore Moos. Left breast treated to 50 Gy in 25 fractions.  Left SCLV and PAB treated to 45 Gy in 25 fractions.  Left breast boost treated to 10 Gy in 5 fractions     07/12/2015 -  Anti-estrogen oral therapy    Letrozole 2.5 mg once daily. Planned duration of therapy: 10 years.     08/19/2015 Imaging    DEXA scan: Normal. (T-score -1.0)    08/24/2015 Mammogram    Diagnostic mammogram: No evidence for malignancy.     09/18/2016 Mammogram    IMPRESSION: Stable lumpectomy changes on the left. No evidence of malignancy bilaterally.    09/20/2017 Mammogram    09/20/2017 Mammogram IMPRESSION: Stable left breast lumpectomy site. No mammographic evidence of malignancy in the bilateral breasts.     HISTORY OF PRESENTING ILLNESS:  Mary PENAFLOR62y.o. female is here because of recently newly diagnosed left breast cancer. She presents to the clinic with her daughter.  This was discovered by screening mammogram. Her prior mammogram was 2 years ago. She noticed mild left nipple retraction, no palpable mass, no other changes. She feels well overall, denies any significant pain except mild right knee pain from arthritis. She denies any change of her appetite,  or recent weight loss. She works for post office, Horticulturist, commercial, and is physically active.   Her family history is notable for breast cancer in her brother, pancreatic cancer in another brother, and colon cancer in her mother. She is postmenopausal, last menstrual period 8 years ago. She has a daughter and a son. She lives with her brother, and her daughter is likely going to stay with her.  CURRENT TREATMENT:  Letrozole 2.5 mg once daily, started on 07/12/2015  INTERIM HISTORY  62 returns for follow up. She was last seen by me 9  months ago. Her 2019 mammogram was benign. 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 Today, he is here alone. She is doing well and denies new complaints. Her appetite and eating are good. She still have residual neuropathy, but is improving and she is capable of performing daily activities without limitations. She denies new joint pain. She still follows up with her surgeon once a year.      MEDICAL HISTORY:  Past Medical History:  Diagnosis Date  . Angina effort    none recent  . Arthritis    "right knee" (03/16/2015)  . Breast cancer, left ([place]) dx'd 2016  . 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 Family history of breast cancer   . Family history of breast cancer in female   . Family history of pancreatic cancer   . Hx of radiation therapy 05/17/15- 06/28/15   Left Breast  . Hypertension   . NSTEMI (non-ST elevated myocardial infarction) ([place]) 03/16/2014   non-obs CAD, spasm seen  . Pneumonia 2009    SURGICAL HISTORY: Past Surgical History:  Procedure Laterality Date  . BREAST BIOPSY Left 08/06/14  . BREAST LUMPECTOMY Left 2017   chemo and radiation  . BREAST LUMPECTOMY WITH NEEDLE LOCALIZATION AND AXILLARY LYMPH NODE DISSECTION Left 03/15/2015   Procedure: LEFT BREAST LUMPECTOMY WITH BRACKETED NEEDLE LOCALIZATION AND AXILLARY LYMPH NODE DISSECTION;  Surgeon: Autumn Messing III, MD;  Location: [facility];  Service: General;  Laterality: Left;  . BUNIONECTOMY Bilateral   . CARDIAC CATHETERIZATION  2010; 03/16/2014  . ECTOPIC PREGNANCY SURGERY  1991   Tubal pregnancy  . LEFT HEART CATHETERIZATION WITH CORONARY ANGIOGRAM N/A 03/16/2014   Procedure: LEFT HEART CATHETERIZATION WITH CORONARY ANGIOGRAM;  Surgeon: Sherren Mocha, MD; Non-obs CAD, EF 55%  . PORT-A-CATH REMOVAL Right 03/15/2015   Procedure: REMOVAL PORT-A-CATH;  Surgeon: Autumn Messing III, MD;  Location: [facility];  Service: General;  Laterality: Right;  . PORTACATH PLACEMENT N/A  08/28/2014   Procedure: INSERTION PORT-A-CATH;  Surgeon: Autumn Messing III, MD;  Location: [facility];  Service: General;  Laterality: N/A;  . RE-EXCISION OF BREAST CANCER,SUPERIOR MARGINS Left 04/21/2015   Procedure: RE-EXCISION OF BREAST CANCER, INFERIOR MARGIN;  Surgeon: Autumn Messing III, MD;  Location: [facility];  Service: General;  Laterality: Left;  . TUBAL LIGATION  1981   GYN HISTORY  Menarchal: 15 LMP: 51 Contraceptive: 4 HRT: no G2P2: no breast feeding    SOCIAL HISTORY: Social History   Social History  . Marital Status: Legally Separated    Spouse Name: N/A  . Number of Children: Daughter 58, son 17   . Years of Education: N/A   Occupational History  . Works for post office    Social History Main Topics  . Smoking status: Never Smoker   . Smokeless tobacco: Never Used  . Alcohol Use: No  . Drug Use: No  .  Sexual Activity: No   Other Topics Concern  . Not on file   Social History Narrative    FAMILY HISTORY: Family History  Problem Relation Age of Onset  . Pancreatic cancer Brother 46  . Breast cancer Brother 58  . Hypertension Mother   . Diabetes type II Mother   . Colon cancer Mother 41  . CVA Father   . Healthy Sister   . Healthy Sister   . Breast cancer Cousin     ALLERGIES:  is allergic to carvedilol; codeine; percocet [oxycodone-acetaminophen]; adhesive [tape]; and compazine [prochlorperazine].  MEDICATIONS:  Current Outpatient Medications  Medication Sig Dispense Refill  . amLODipine (NORVASC) 5 MG tablet Take 1 tablet (5 mg total) by mouth daily. 90 tablet 3  . atorvastatin (LIPITOR) 40 MG tablet Take 1 tablet (40 mg total) by mouth daily at 6 PM. 90 tablet 3  . hydrochlorothiazide (HYDRODIURIL) 25 MG tablet TAKE 1 TABLET BY MOUTH ONCE DAILY 90 tablet 0  . irbesartan (AVAPRO) 300 MG tablet Take 1 tablet (300 mg total) by mouth daily. 90 tablet 3  . isosorbide mononitrate (IMDUR) 30 MG 24 hr tablet TAKE 1 TABLET BY MOUTH ONCE DAILY 90 tablet 2  .  letrozole (FEMARA) 2.5 MG tablet Take 1 tablet (2.5 mg total) by mouth daily. 90 tablet 3  . nitroGLYCERIN (NITROSTAT) 0.4 MG SL tablet Place 1 tablet (0.4 mg total) under the tongue every 5 (five) minutes as needed for chest pain. 25 tablet 12  . potassium chloride (K-DUR) 10 MEQ tablet Take 1 tablet (10 mEq total) by mouth every evening. 90 tablet 3   No current facility-administered medications for this visit.    REVIEW OF SYSTEMS:   Constitutional: Denies fevers, chills or abnormal night sweats, hot flashes Eyes: Denies blurriness of vision, double vision or watery eyes Ears, nose, mouth, throat, and face: Denies mucositis or sore throat Respiratory: Denies cough, dyspnea or wheezes Cardiovascular: Denies palpitation, chest discomfort or lower extremity swelling Gastrointestinal:  Denies nausea, heartburn or change in bowel habits Skin: Denies abnormal skin rashes Lymphatics: Denies new lymphadenopathy or easy bruising Neurological:Denies numbness, tingling or new weaknesses (+) tingling hands and feet, improving Behavioral/Psych: Mood is stable, no new changes  Musculoskeletal: No new joint or muscle pain All other systems were reviewed with the patient and are negative.  PHYSICAL EXAMINATION: ECOG PERFORMANCE STATUS: 0  Vitals:   10/17/17 0947  BP: (!) 148/96  Pulse: (!) 58  Resp: 16  Temp: 98.6 F (37 C)  SpO2: 98%   Filed Weights   10/17/17 0947  Weight: 170 lb 8 oz (77.3 kg)     GENERAL:alert, no distress and comfortable SKIN: skin color, texture, turgor are normal, no rashes or significant lesions EYES: normal, conjunctiva are pink and non-injected, sclera clear OROPHARYNX:no exudate, no erythema and lips, buccal mucosa, and tongue normal  NECK: supple, thyroid normal size, non-tender, without nodularity LYMPH:  no palpable lymphadenopathy in the cervical, axillary or inguinal LUNGS: clear to auscultation and percussion with normal breathing effort HEART: regular  rate & rhythm and no murmurs and no lower extremity edema ABDOMEN:abdomen soft, non-tender and normal bowel sounds Musculoskeletal:no cyanosis of digits and no clubbing,  PSYCH: alert & oriented x 3 with fluent speech NEURO: no focal motor/sensory deficits Breasts: Breast inspection showed them to be symmetrical with no nipple discharge. (+) The surgical incision in the left breast and axilla are well healed, Moderate hyperpigmentation from radiation and mild Lymphedema of left breast, no skin  ulcer or peeling. Exam of both breasts and axilla revealed no evidence of mass or adenopathy.   LABORATORY DATA:  I have reviewed the data as listed CBC Latest Ref Rng & Units 10/17/2017 01/16/2017 07/19/2016  WBC 4.0 - 10.5 K/uL 4.2 3.8(L) 4.4  Hemoglobin 12.0 - 15.0 g/dL 12.6 12.1 12.2  Hematocrit 36.0 - 46.0 % 39.1 37.3 37.7  Platelets 150 - 400 K/uL 213 231 176    CMP Latest Ref Rng & Units 10/17/2017 01/16/2017 07/19/2016  Glucose 70 - 99 mg/dL 90 94 112  BUN 8 - 23 mg/dL 13 13 13.1  Creatinine 0.44 - 1.00 mg/dL 1.03(H) 1.04 1.1  Sodium 135 - 145 mmol/L 142 141 142  Potassium 3.5 - 5.1 mmol/L 4.0 4.1 3.7  Chloride 98 - 111 mmol/L 105 105 -  CO2 22 - 32 mmol/L '27 26 26  '$ Calcium 8.9 - 10.3 mg/dL 10.8(H) 10.6(H) 10.9(H)  Total Protein 6.5 - 8.1 g/dL 7.4 7.0 6.8  Total Bilirubin 0.3 - 1.2 mg/dL 1.2 0.7 0.65  Alkaline Phos 38 - 126 U/L 90 88 109  AST 15 - 41 U/L '15 16 14  '$ ALT 0 - 44 U/L '10 13 14    '$ PATHOLOGY REPORT: Diagnosis 03/15/2015 1. 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 - INTERMEDIATE GRADE DUCTAL CARCINOMA IN SITU. - DUCTAL CARCINOMA IN SITU IS FOCALLY 0.1 TO 0.2 CM TO SUPERIOR MARGIN. - DUCTAL CARCINOMA IN SITU IS FOCALLY 0.2 CM TO LATERAL MARGIN. - INVASIVE DUCTAL CARCINOMA INVOLVES LATERAL MARGIN. - PLEASE CORRELATE WITH LOCATION OF SPECIMENS NUMBER 2 (ADDITIONAL SUPERIOR MARGIN) AND NUMBER 3 (ADDITIONAL LATERAL MARGIN)  FOR FINAL MARGIN STATUS. - INVASIVE DUCTAL CARCINOMA FOCALLY INVOLVES INFERIOR MARGIN. - OTHER MARGINS ARE NEGATIVE. - SEE ONCOLOGY TEMPLATE. 2. Breast, excision, Left additional superior margin - FOCAL INTERMEDIATE DUCTAL CARCINOMA IN SITU. - MARGIN IS NEGATIVE (TUMOR IS GREATER THAN 0.4 CM TO MARGIN). 3. Breast, excision, Left additional lateral margin - BENIGN BREAST PARENCHYMA WITH FIBROCYSTIC CHANGES. - NO TUMOR SEEN. 4. 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 Microscopic Comment 1. BREAST, INVASIVE TUMOR, WITH LYMPH NODES PRESENT Specimen, including laterality and lymph node sampling (sentinel, non-sentinel): Left partial breast with left additional superior margin and left additional lateral margin; left non-sentinel lymph nodes. Procedure: Left breast lumpectomy with additional left superior margin and additional left lateral margin excisions; left axillary lymph node dissection. Histologic type: Invasive ductal carcinoma. Grade: Tubule formation: 3. Nuclear pleomorphism: 2. Mitotic:1. Tumor size (gross measurement): 2 cm. Margins: Invasive, distance to closest margin: Invasive ductal carcinoma involves lateral margin on initial lumpectomy specimen but additional lateral margin (specimen number 3) is negative for invasive ductal carcinoma; invasive ductal carcinoma focally involves inferior margin. In-situ, distance to closest margin: Ductal carcinoma in situ is focally 0.1 to 0.2 cm to superior margin on initial lumpectomy specimen but is at least 0.4 cm away from additional superior margin (see specimen #4).. If margin positive, focally or broadly: Initial breast lumpectomy demonstrates fairly broad involvement of the lateral margin; however, the additional lateral margin excision is negative for invasive ductal carcinoma; invasive ductal carcinoma focally involves the inferior  margin. Lymphovascular invasion: Definitive lymphovascular invasion is not identified; however, a lymph node is positive for metastatic ductal carcinoma, see below. Ductal carcinoma in situ: Yes. Grade: Intermediate grade. Extensive intraductal component: No. Lobular neoplasia: Not identified. Tumor focality: Unifocal. Treatment effect: Some degree  of treatment effect is present in the breast tissue. If present, treatment effect in breast tissue, lymph nodes or both: Treatment effect is only seen in breast tissue. Extent of tumor: Tumor present in breast tissue. Skin: Not received. Nipple: Not received. Skeletal muscle: Not received. Lymph nodes: Examined: 0 Sentinel. 15 Non-sentinel. 15 Total. Lymph nodes with metastasis: 1. Isolated tumor cells (< 0.2 mm): 0.  Microscopic Comment(continued) Micrometastasis: (> 0.2 mm and < 2.0 mm): 0. Macrometastasis: (> 2.0 mm): 1. Extracapsular extension: Not identified. Breast prognostic profile: Performed on previous case, [phone] Estrogen receptor: 100%, positive. Progesterone receptor: 5%, positive. Her-2 neu: 1.47 ratio, negative. Ki-67: 50%. Non-neoplastic breast: Fibrocystic changes. TNM: ypT1c, ypN1a. Comments: As Her-2 neu was previously negative, this will be repeated on representative current tumor and reported in an addendum to follow. Dr. Lyndon Code has seen selected slides (1A, 1G and 2I) in consultation with agreement that invasive ductal carcinoma involves the lateral margin and focally involves the inferior margin and that ductal carcinoma in situ is focally 0.1 to 0.2 cm to superior margin. He is also in agreement that the findings in slide 2I represent focal ductal carcinoma in situ. (RH:ecj 03/17/2015)  Results: HER2 - NEGATIVE RATIO OF HER2/CEP17 SIGNALS 1.62 AVERAGE HER2 COPY NUMBER PER CELL 2.35  Diagnosis 04/21/2015 Breast, excision, left inferior margin BREAST TISSUE WITH PREVIOUS RESECTION SITE  CHANGES NEGATIVE FOR MALIGNANCY   RADIOGRAPHIC STUDIES: I have personally reviewed the radiological images as listed and agreed with the findings in the report.  09/20/2017 Mammogram IMPRESSION: Stable left breast lumpectomy site. No mammographic evidence of malignancy in the bilateral breasts.  Diagnostic Mammogram 08/24/2015 IMPRESSION: No mammographic evidence for malignancy.  DEXA Scan 08/19/2015 The BMD measured at Femur Total Left is 0.879 g/cm2 with a T-score of -1.0. This patient is considered normal according to [facility]) criteria. This patient does not meet criteria for FRAX assessment.  Site Region Measured Date Measured Age YA BMD Significant CHANGE T-score  DualFemur Total Left 08/19/2015 59.6 -1.1 0.879 g/cm2  AP Spine L1-L4 08/19/2015 59.6 -0.2 1.183 g/cm2  Left Forearm Radius 33% 08/19/2015 59.6 -0.3 0.859 g/cm2  World Health Organization [facility]) criteria for post-menopausal, Caucasian Women: Normal       T-score at or above -1 SD Osteopenia   T-score between -1 and -2.5 SD Osteoporosis T-score at or below -2.5 SD  Echo 08/24/2014 Study Conclusions  - Left ventricle: The cavity size was normal. Wall thickness was normal. Systolic function was normal. The estimated ejection fraction was in the range of 55% to 60%. - Left atrium: The atrium was mildly dilated.  MR breast b/l w wo contrast 02/01/2015  IMPRESSION: Interval decrease in size of retroareolar mass within the left    breast with decreased conspicuity of previously described non mass enhancement extending from the posterior margin of the mass. Interval decrease in cortical thickening of previously described left axillary lymph nodes.  Diagnostic Mammogram 08/24/15 IMPRESSION: No mammographic evidence for malignancy.  Diagnostic Mammogram 09/18/16 IMPRESSION: Stable lumpectomy changes on the left. No evidence of malignancy bilaterally.  ASSESSMENT & PLAN:   62  y.o. African-American female, postmenopausal, healthy and fit, who was found to have a left breast mass and a biopsy confirmed node metastasis by screening mammogram.  1. Left breast invasive ductal carcinoma, cT3N1M0, stage IIIA, ER+/PR+/HER2-, ypT1cN1aM0 after neoadjuvant chemotherapy -I previously reviewed her surgical pathology findings with her and her daughter in great details. She had a partial response to neoadjuvant chemotherapy, tumor size  has significantly decreased, however she still has 1 positive axilla node. -She had a reexcision for positive margins, final path was negative for malignant cells. - we again discussed the risk of cancer recurrence after her surgery,  Due to the locally advanced stage, persistent node-positive disease after neoadjuvant chemotherapy, she certainly has high risk for recurrence. -She has started adjuvant letrozole, tolerating well so far. We'll continue for 7-10 years if she is able tolerate -will continue breast cancer surveillance plan. She'll continue annual mammogram, follow-up regularly with lab and exam. -She is clinically doing well, lab results reviewed with her, exam was unremarkable. Her last mammogram in 09/2017 was normal. No clinical concern for recurrence. -Labs reviewed, CBC is WNLs and CMP showed Ca 10.8. I advised her to not take calcium and stay hydrated.  -She will follow up with Dr. Marlou Starks in 6 months -Will f/u in one year   2. Bone health -A bone density scan from August 2017 was normal, T score -1.0. She will repeat in 2 years, 2019 -She has hypercalcemia, not on calcium. -I previously encouraged her to exercise, especially weight-bearing exercise and to drink more water.  -I recommended that she try a Vitamin D supplement or a multivitamin.   3. Cancer genetics -She has very strong family history of breast, colon and pancreatic cancer, all in first-degree, I recommend her to see genetic counselor to ruled out inheritable breast  cancer syndrome.  -her genetic testing result was normal  4. History of non-STEMI in 03/2014 -She had cardiac catheterization, which showed mild diffuse coronary artery disease, but no need intervention.  -She will follow-up with her cardiologist Dr. Burt Knack -We again discussed some of the chemotherapy drug may cause heart failure, we'll repeat her echo after chemo, she may need follow-up with Dr. Burt Knack during her chemotherapy.   5.Peripheral neuropathy, G1 -Secondary to chemotherapy, overall much improved. -she is off Neurontin now  -overall mild and stable   6. Hypercalcemia -Her calcium has been around 11 -Her vitamin D level is low, PTH was normal. -I encouraged her to follow-up with her primary care physician Dr. Radene Ou. I previously copied my note to Dr. Radene Ou    Plan -continue letrozole  -Mammogram in 09/2018 -f/u in one year with labs, she will see Dr. Marlou Starks in 6 months    All questions were answered. The patient knows to call the clinic with any problems, questions or concerns.  I spent 15 minutes counseling the patient face to face. The total time spent in the appointment was 20 minutes and more than 50% was on counseling.  Dierdre Searles Dweik am acting as scribe for Dr. Truitt Merle.  I have reviewed the above documentation for accuracy and completeness, and I agree with the above.     Truitt Merle, MD 10/17/2017

## 2017-10-17 ENCOUNTER — Telehealth: Payer: Self-pay | Admitting: Hematology

## 2017-10-17 ENCOUNTER — Inpatient Hospital Stay: Payer: Federal, State, Local not specified - PPO | Admitting: Hematology

## 2017-10-17 ENCOUNTER — Inpatient Hospital Stay: Payer: Federal, State, Local not specified - PPO | Attending: Hematology

## 2017-10-17 ENCOUNTER — Encounter: Payer: Self-pay | Admitting: Hematology

## 2017-10-17 DIAGNOSIS — Z79811 Long term (current) use of aromatase inhibitors: Secondary | ICD-10-CM | POA: Insufficient documentation

## 2017-10-17 DIAGNOSIS — G620 Drug-induced polyneuropathy: Secondary | ICD-10-CM

## 2017-10-17 DIAGNOSIS — C773 Secondary and unspecified malignant neoplasm of axilla and upper limb lymph nodes: Secondary | ICD-10-CM

## 2017-10-17 DIAGNOSIS — C50212 Malignant neoplasm of upper-inner quadrant of left female breast: Secondary | ICD-10-CM

## 2017-10-17 DIAGNOSIS — I252 Old myocardial infarction: Secondary | ICD-10-CM | POA: Diagnosis not present

## 2017-10-17 DIAGNOSIS — I2510 Atherosclerotic heart disease of native coronary artery without angina pectoris: Secondary | ICD-10-CM | POA: Diagnosis not present

## 2017-10-17 DIAGNOSIS — Z170 Estrogen receptor positive status [ER+]: Secondary | ICD-10-CM | POA: Insufficient documentation

## 2017-10-17 DIAGNOSIS — Z923 Personal history of irradiation: Secondary | ICD-10-CM

## 2017-10-17 DIAGNOSIS — I10 Essential (primary) hypertension: Secondary | ICD-10-CM

## 2017-10-17 LAB — CBC WITH DIFFERENTIAL/PLATELET
ABS IMMATURE GRANULOCYTES: 0.01 10*3/uL (ref 0.00–0.07)
BASOS PCT: 0 %
Basophils Absolute: 0 10*3/uL (ref 0.0–0.1)
Eosinophils Absolute: 0.1 10*3/uL (ref 0.0–0.5)
Eosinophils Relative: 2 %
HCT: 39.1 % (ref 36.0–46.0)
HEMOGLOBIN: 12.6 g/dL (ref 12.0–15.0)
Immature Granulocytes: 0 %
LYMPHS ABS: 1.4 10*3/uL (ref 0.7–4.0)
LYMPHS PCT: 33 %
MCH: 32.6 pg (ref 26.0–34.0)
MCHC: 32.2 g/dL (ref 30.0–36.0)
MCV: 101 fL — AB (ref 80.0–100.0)
MONOS PCT: 8 %
Monocytes Absolute: 0.3 10*3/uL (ref 0.1–1.0)
NEUTROS ABS: 2.4 10*3/uL (ref 1.7–7.7)
NEUTROS PCT: 57 %
PLATELETS: 213 10*3/uL (ref 150–400)
RBC: 3.87 MIL/uL (ref 3.87–5.11)
RDW: 14.6 % (ref 11.5–15.5)
WBC: 4.2 10*3/uL (ref 4.0–10.5)
nRBC: 0 % (ref 0.0–0.2)

## 2017-10-17 LAB — COMPREHENSIVE METABOLIC PANEL
ALBUMIN: 4 g/dL (ref 3.5–5.0)
ALK PHOS: 90 U/L (ref 38–126)
ALT: 10 U/L (ref 0–44)
AST: 15 U/L (ref 15–41)
Anion gap: 10 (ref 5–15)
BUN: 13 mg/dL (ref 8–23)
CALCIUM: 10.8 mg/dL — AB (ref 8.9–10.3)
CO2: 27 mmol/L (ref 22–32)
CREATININE: 1.03 mg/dL — AB (ref 0.44–1.00)
Chloride: 105 mmol/L (ref 98–111)
GFR calc Af Amer: >60 mL/min (ref >60)
GFR calc non Af Amer: 57 mL/min — ABNORMAL LOW (ref >60)
Glucose, Bld: 90 mg/dL (ref 70–99)
Potassium: 4 mmol/L (ref 3.5–5.1)
Sodium: 142 mmol/L (ref 135–145)
Total Bilirubin: 1.2 mg/dL (ref 0.3–1.2)
Total Protein: 7.4 g/dL (ref 6.5–8.1)

## 2017-10-17 MED ORDER — LETROZOLE 2.5 MG PO TABS: 2.5000 mg | ORAL_TABLET | Freq: Every day | ORAL | 3 refills | Status: DC

## 2017-10-17 NOTE — Telephone Encounter (Signed)
Gave pt avs and calendar  °

## 2017-10-25 ENCOUNTER — Ambulatory Visit
Admission: RE | Admit: 2017-10-25 | Discharge: 2017-10-25 | Disposition: A | Discharge status: Home / Self Care | Payer: Federal, State, Local not specified - PPO | Source: Ambulatory Visit | Attending: Hematology | Admitting: Hematology

## 2017-10-25 DIAGNOSIS — Z780 Asymptomatic menopausal state: Secondary | ICD-10-CM | POA: Diagnosis not present

## 2017-10-25 DIAGNOSIS — M8589 Other specified disorders of bone density and structure, multiple sites: Secondary | ICD-10-CM | POA: Diagnosis not present

## 2017-10-25 DIAGNOSIS — E2839 Other primary ovarian failure: Secondary | ICD-10-CM

## 2017-10-29 ENCOUNTER — Telehealth: Payer: Self-pay

## 2017-10-29 NOTE — Telephone Encounter (Signed)
Spoke with patient let her know DEXA scan shows osteopenia, but no high risk for fracture, stable sinc 2017.  Due to her hypercalcemia not on Calcium Vit D.

## 2017-10-29 NOTE — Telephone Encounter (Signed)
-----   Message from Truitt Merle, MD sent at 10/28/2017  8:16 PM EDT ----- Please let pt know the DEXA result, she has osteopenia, but no high risk for fracture, stable since 2017. Due to her hypercalcemia, she is not on calcium or VitD.   Truitt Merle  10/28/2017

## 2017-12-09 ENCOUNTER — Other Ambulatory Visit: Payer: Self-pay | Admitting: Cardiovascular Disease

## 2017-12-27 ENCOUNTER — Other Ambulatory Visit: Payer: Self-pay | Admitting: Cardiovascular Disease

## 2017-12-29 ENCOUNTER — Other Ambulatory Visit: Payer: Self-pay | Admitting: Cardiovascular Disease

## 2018-06-18 DIAGNOSIS — H35033 Hypertensive retinopathy, bilateral: Secondary | ICD-10-CM | POA: Diagnosis not present

## 2018-09-05 ENCOUNTER — Other Ambulatory Visit: Payer: Self-pay | Admitting: Cardiovascular Disease

## 2018-09-09 NOTE — Progress Notes (Signed)
Cardiology Office Note:    Date:  09/10/2018   ID:  Mary Randall, DOB Mar 25, 1955, MRN [id number]  PCP:  Mary Nip, MD  Cardiologist:  Mary Mocha, MD   Electrophysiologist:  None   Referring MD: Mary Nip, MD   Chief Complaint  Patient presents with  . Follow-up    Prinzmetal Angina     History of Present Illness:    Mary Randall is a 63 y.o. female with:  Prinzmetal angina  Presented in 2016 with chest pain and marked EKG changes  Coronary artery disease   Mild diffuse non-obs CAD by cath in 2016  Breast CA  Hypertension   Hyperlipidemia   Mary Randall was last seen by Dr. Burt Knack in 08/2017.  She returns for follow up.  She had her amlodipine reduced last year due to low BP and dizziness.  This did help but she notes that she continues to feel lightheaded.  She ran out of Amlodipine yesterday.  She feels good today without any dizziness.  She has not had chest pain, shortness of breath, syncope, leg swelling.    Prior CV studies:   The following studies were reviewed today:  Echocardiogram 08/24/14 EF 55-60, mild LAE  Cardiac catheterization 03/16/14 LM minor irreg LAD mid 40-50; D2 ost 50 LCx mid 20 RCA patent EF 60-65   Past Medical History:  Diagnosis Date  . Angina effort    none recent  . Arthritis    "right knee" (03/16/2015)  . Breast cancer, left (Mary Randall) dx'd 2016  . 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 Family history of breast cancer   . Family history of breast cancer in female   . Family history of pancreatic cancer   . Hx of radiation therapy 05/17/15- 06/28/15   Left Breast  . Hypertension   . NSTEMI (non-ST elevated myocardial infarction) (Mary Randall) 03/16/2014   non-obs CAD, spasm seen  . Pneumonia 2009   Surgical Hx: The patient  has a past surgical history that includes Ectopic pregnancy surgery (1991); Cardiac catheterization (2010; 03/16/2014); Tubal ligation (1981); left heart catheterization with  coronary angiogram (N/A, 03/16/2014); Bunionectomy (Bilateral); Portacath placement (N/A, 08/28/2014); Breast lumpectomy with needle localization and axillary lymph node dissection (Left, 03/15/2015); Port-a-cath removal (Right, 03/15/2015); Breast biopsy (Left, 08/06/14); Re-excision of breast cancer,superior margins (Left, 04/21/2015); and Breast lumpectomy (Left, 2017).   Current Medications: Current Meds  Medication Sig  . amLODipine (NORVASC) 5 MG tablet Take 1 tablet (5 mg total) by mouth daily.  Marland Kitchen atorvastatin (LIPITOR) 40 MG tablet TAKE 1 TABLET BY MOUTH ONCE DAILY AT 6 PM  . hydrochlorothiazide (HYDRODIURIL) 25 MG tablet TAKE 1 TABLET BY MOUTH ONCE DAILY  . isosorbide mononitrate (IMDUR) 30 MG 24 hr tablet TAKE 1 TABLET BY MOUTH ONCE DAILY  . letrozole (FEMARA) 2.5 MG tablet Take 1 tablet (2.5 mg total) by mouth daily.  . nitroGLYCERIN (NITROSTAT) 0.4 MG SL tablet Place 1 tablet (0.4 mg total) under the tongue every 5 (five) minutes as needed for chest pain.  . potassium chloride (K-DUR) 10 MEQ tablet TAKE 1 TABLET BY MOUTH EVERY EVENING  . [DISCONTINUED] amLODipine (NORVASC) 5 MG tablet TAKE 1 TABLET BY MOUTH EVERY DAY  . [DISCONTINUED] irbesartan (AVAPRO) 300 MG tablet TAKE 1 TABLET BY MOUTH ONCE DAILY     Allergies:   Carvedilol, Codeine, Percocet [oxycodone-acetaminophen], Adhesive [tape], and Compazine [prochlorperazine]   Social History   Tobacco Use  . Smoking status: Never Smoker  .  Smokeless tobacco: Never Used  Substance Use Topics  . Alcohol use: No  . Drug use: No     Family Hx: The patient's family history includes Breast cancer in her cousin; Breast cancer (age of onset: 33) in her brother; CVA in her father; Colon cancer (age of onset: 57) in her mother; Diabetes type II in her mother; Healthy in her sister and sister; Hypertension in her mother; Pancreatic cancer (age of onset: 61) in her brother.  ROS:   Please see the history of present illness.    ROS All other  systems reviewed and are negative.   EKGs/Labs/Other Test Reviewed:    EKG:  EKG is  ordered today.  The ekg ordered today demonstrates normal sinus rhythm, HR 73, non-specific ST-TW changes, QTc 414   Recent Labs: 10/17/2017: ALT 10; BUN 13; Creatinine, Ser 1.03; Hemoglobin 12.6; Platelets 213; Potassium 4.0; Sodium 142   Recent Lipid Panel Lab Results  Component Value Date/Time   CHOL 144 03/17/2014 04:02 AM   TRIG 136 03/17/2014 04:02 AM   HDL 39 (L) 03/17/2014 04:02 AM   CHOLHDL 3.7 03/17/2014 04:02 AM   LDLCALC 78 03/17/2014 04:02 AM    Physical Exam:    VS:  BP 118/80   Pulse 73   Ht 5' 7.5" (1.715 m)   Wt 175 lb (79.4 kg)   SpO2 98%   BMI 27.00 kg/m     Wt Readings from Last 3 Encounters:  09/10/18 175 lb (79.4 kg)  10/17/17 170 lb 8 oz (77.3 kg)  08/20/17 169 lb 3.2 oz (76.7 kg)     Physical Exam  Constitutional: She is oriented to person, place, and time. She appears well-developed and well-nourished. No distress.  HENT:  Head: Normocephalic and atraumatic.  Neck: Neck supple. No JVD present.  Cardiovascular: Normal rate, regular rhythm, S1 normal, S2 normal and normal heart sounds.  No murmur heard. Pulmonary/Chest: Effort normal. She has no rales.  Abdominal: Soft. There is no hepatomegaly.  Musculoskeletal:        General: No edema.  Neurological: She is alert and oriented to person, place, and time.  Skin: Skin is warm and dry.    ASSESSMENT & PLAN:    1. Prinzmetal variant angina (HCC) Non-obstructive coronary artery disease on cath in 2016.  She is doing well without angina.  Continue amlodipine, atorvastatin, Isosorbide.    2. Essential hypertension, benign She continues to feel lightheaded.  I suspect her BP continues to run low.  She missed her dose of Amlodipine today and feels good.  Her BP today, without Amlodipine, is 118/80.  I will reduce her Avapro to 150 mg once daily.  She knows to call if her dizziness continues.    3.  Hyperlipidemia, unspecified hyperlipidemia type Continue statin Rx. Request most recent lipid panel from her PCP.     Dispo:  Return in about 1 year (around 09/10/2019) for Routine Follow Up, w/ Dr. Burt Knack, or Richardson Dopp, PA-C.   Medication Adjustments/Labs and Tests Ordered: Current medicines are reviewed at length with the patient today.  Concerns regarding medicines are outlined above.  Tests Ordered: Orders Placed This Encounter  Procedures  . EKG 12-Lead   Medication Changes: Meds ordered this encounter  Medications  . amLODipine (NORVASC) 5 MG tablet    Sig: Take 1 tablet (5 mg total) by mouth daily.    Dispense:  90 tablet    Refill:  3  . irbesartan (AVAPRO) 150 MG tablet  Sig: Take 1 tablet (150 mg total) by mouth daily.    Dispense:  90 tablet    Refill:  3    Signed, Richardson Dopp, PA-C  09/10/2018 1:46 PM    [facility] [place], [place], [place]  [address] Phone: [phone]; Fax: [phone]

## 2018-09-10 ENCOUNTER — Ambulatory Visit: Payer: Federal, State, Local not specified - PPO | Admitting: Physician Assistant

## 2018-09-10 ENCOUNTER — Other Ambulatory Visit: Payer: Self-pay

## 2018-09-10 ENCOUNTER — Encounter: Payer: Self-pay | Admitting: Physician Assistant

## 2018-09-10 VITALS — BP 118/80 | HR 73 | Ht 67.5 in | Wt 175.0 lb

## 2018-09-10 DIAGNOSIS — E785 Hyperlipidemia, unspecified: Secondary | ICD-10-CM

## 2018-09-10 DIAGNOSIS — I10 Essential (primary) hypertension: Secondary | ICD-10-CM | POA: Diagnosis not present

## 2018-09-10 DIAGNOSIS — I201 Angina pectoris with documented spasm: Secondary | ICD-10-CM

## 2018-09-10 MED ORDER — IRBESARTAN 150 MG PO TABS: 150.0000 mg | ORAL_TABLET | Freq: Every day | ORAL | 3 refills | Status: DC

## 2018-09-10 MED ORDER — AMLODIPINE BESYLATE 5 MG PO TABS: 5.0000 mg | ORAL_TABLET | Freq: Every day | ORAL | 3 refills | Status: DC

## 2018-09-10 NOTE — Patient Instructions (Signed)
Medication Instructions:  Your physician has recommended you make the following change in your medication: 1. Decrease Avarpro to one tablet (150 mg) daily.  You can use up your (300 mg ) tablets by taking one half tablet.  New script sent to requested pharmacy along with Amlodipine.   If you need a refill on your cardiac medications before your next appointment, please call your pharmacy.   Lab work: Will get labs faxed from PCP.  Testing/Procedures: -None  Follow-Up: At [facility], you and your health needs are our priority.  As part of our continuing mission to provide you with exceptional heart care, we have created designated Provider Care Teams.  These Care Teams include your primary Cardiologist (physician) and Advanced Practice Providers (APPs -  Physician Assistants and Nurse Practitioners) who all work together to provide you with the care you need, when you need it. You will need a follow up appointment in:  1 years.  Please call our office 2 months in advance to schedule this appointment.  You may see Sherren Mocha, MD or one of the following Advanced Practice Providers on your designated Care Team: Richardson Dopp, PA-C [place], [place] . Daune Perch, NP  Any Other Special Instructions Will Be Listed Below (If Applicable).

## 2018-09-26 ENCOUNTER — Other Ambulatory Visit: Payer: Self-pay

## 2018-09-26 ENCOUNTER — Ambulatory Visit
Admission: RE | Admit: 2018-09-26 | Discharge: 2018-09-26 | Disposition: A | Discharge status: Home / Self Care | Payer: Federal, State, Local not specified - PPO | Source: Ambulatory Visit | Attending: Hematology | Admitting: Hematology

## 2018-09-26 DIAGNOSIS — R928 Other abnormal and inconclusive findings on diagnostic imaging of breast: Secondary | ICD-10-CM | POA: Diagnosis not present

## 2018-09-26 DIAGNOSIS — C50212 Malignant neoplasm of upper-inner quadrant of left female breast: Secondary | ICD-10-CM

## 2018-09-26 DIAGNOSIS — Z170 Estrogen receptor positive status [ER+]: Secondary | ICD-10-CM

## 2018-09-26 HISTORY — DX: Personal history of antineoplastic chemotherapy: Z92.21

## 2018-09-26 HISTORY — DX: Personal history of irradiation: Z92.3

## 2018-10-12 ENCOUNTER — Other Ambulatory Visit: Payer: Self-pay | Admitting: Cardiovascular Disease

## 2018-10-21 ENCOUNTER — Telehealth: Payer: Self-pay | Admitting: Nurse Practitioner

## 2018-10-21 NOTE — Telephone Encounter (Signed)
Returned patient's phone call regarding rescheduling an appointment, per patient's request appointment has moved to 11/05.

## 2018-10-23 ENCOUNTER — Inpatient Hospital Stay: Payer: Federal, State, Local not specified - PPO | Admitting: Hematology

## 2018-10-23 ENCOUNTER — Inpatient Hospital Stay: Payer: Federal, State, Local not specified - PPO

## 2018-11-03 ENCOUNTER — Emergency Department (HOSPITAL_BASED_OUTPATIENT_CLINIC_OR_DEPARTMENT_OTHER)
Admission: EM | Admit: 2018-11-03 | Discharge: 2018-11-03 | Disposition: A | Discharge status: Home / Self Care | Payer: Federal, State, Local not specified - PPO | Attending: Emergency Medicine | Admitting: Emergency Medicine

## 2018-11-03 ENCOUNTER — Encounter (HOSPITAL_BASED_OUTPATIENT_CLINIC_OR_DEPARTMENT_OTHER): Payer: Self-pay | Admitting: Emergency Medicine

## 2018-11-03 ENCOUNTER — Other Ambulatory Visit: Payer: Self-pay

## 2018-11-03 ENCOUNTER — Emergency Department (HOSPITAL_BASED_OUTPATIENT_CLINIC_OR_DEPARTMENT_OTHER): Payer: Federal, State, Local not specified - PPO

## 2018-11-03 DIAGNOSIS — I10 Essential (primary) hypertension: Secondary | ICD-10-CM | POA: Diagnosis not present

## 2018-11-03 DIAGNOSIS — R109 Unspecified abdominal pain: Secondary | ICD-10-CM | POA: Diagnosis not present

## 2018-11-03 DIAGNOSIS — R10824 Left lower quadrant rebound abdominal tenderness: Secondary | ICD-10-CM

## 2018-11-03 DIAGNOSIS — N3000 Acute cystitis without hematuria: Secondary | ICD-10-CM

## 2018-11-03 DIAGNOSIS — Z79899 Other long term (current) drug therapy: Secondary | ICD-10-CM | POA: Insufficient documentation

## 2018-11-03 DIAGNOSIS — R1032 Left lower quadrant pain: Secondary | ICD-10-CM | POA: Diagnosis not present

## 2018-11-03 LAB — CBC WITH DIFFERENTIAL/PLATELET
Abs Immature Granulocytes: 0.01 10*3/uL (ref 0.00–0.07)
Basophils Absolute: 0 10*3/uL (ref 0.0–0.1)
Basophils Relative: 0 %
Eosinophils Absolute: 0.1 10*3/uL (ref 0.0–0.5)
Eosinophils Relative: 1 %
HCT: 32.3 % — ABNORMAL LOW (ref 36.0–46.0)
Hemoglobin: 10.9 g/dL — ABNORMAL LOW (ref 12.0–15.0)
Immature Granulocytes: 0 %
Lymphocytes Relative: 45 %
Lymphs Abs: 1.7 10*3/uL (ref 0.7–4.0)
MCH: 40.8 pg — ABNORMAL HIGH (ref 26.0–34.0)
MCHC: 33.7 g/dL (ref 30.0–36.0)
MCV: 121 fL — ABNORMAL HIGH (ref 80.0–100.0)
Monocytes Absolute: 0.2 10*3/uL (ref 0.1–1.0)
Monocytes Relative: 5 %
Neutro Abs: 1.9 10*3/uL (ref 1.7–7.7)
Neutrophils Relative %: 49 %
Platelets: 215 10*3/uL (ref 150–400)
RBC: 2.67 MIL/uL — ABNORMAL LOW (ref 3.87–5.11)
RDW: 13.6 % (ref 11.5–15.5)
Smear Review: NORMAL
WBC: 3.8 10*3/uL — ABNORMAL LOW (ref 4.0–10.5)
nRBC: 0 % (ref 0.0–0.2)

## 2018-11-03 LAB — URINALYSIS, ROUTINE W REFLEX MICROSCOPIC
Bilirubin Urine: NEGATIVE
Glucose, UA: NEGATIVE mg/dL
Hgb urine dipstick: NEGATIVE
Ketones, ur: NEGATIVE mg/dL
Nitrite: POSITIVE — AB
Protein, ur: NEGATIVE mg/dL
Specific Gravity, Urine: 1.015 (ref 1.005–1.030)
pH: 7.5 (ref 5.0–8.0)

## 2018-11-03 LAB — BASIC METABOLIC PANEL
Anion gap: 7 (ref 5–15)
BUN: 14 mg/dL (ref 8–23)
CO2: 26 mmol/L (ref 22–32)
Calcium: 10.2 mg/dL (ref 8.9–10.3)
Chloride: 103 mmol/L (ref 98–111)
Creatinine, Ser: 1.03 mg/dL — ABNORMAL HIGH (ref 0.44–1.00)
GFR calc Af Amer: >60 mL/min (ref >60)
GFR calc non Af Amer: 58 mL/min — ABNORMAL LOW (ref >60)
Glucose, Bld: 100 mg/dL — ABNORMAL HIGH (ref 70–99)
Potassium: 3.5 mmol/L (ref 3.5–5.1)
Sodium: 136 mmol/L (ref 135–145)

## 2018-11-03 LAB — URINALYSIS, MICROSCOPIC (REFLEX)

## 2018-11-03 MED ORDER — IOHEXOL 300 MG/ML  SOLN
100.0000 mL | Freq: Once | INTRAMUSCULAR | Status: AC | PRN
  Administered 2018-11-03: 100 mL via INTRAVENOUS

## 2018-11-03 MED ORDER — CEPHALEXIN 250 MG PO CAPS
500.0000 mg | ORAL_CAPSULE | Freq: Once | ORAL | Status: AC
  Administered 2018-11-03: 500 mg via ORAL
  Filled 2018-11-03: qty 2

## 2018-11-03 MED ORDER — CEPHALEXIN 500 MG PO CAPS: 500.0000 mg | ORAL_CAPSULE | Freq: Four times a day (QID) | ORAL | 0 refills | Status: DC

## 2018-11-03 NOTE — ED Triage Notes (Signed)
Pt c/o LLQ abdominal pain x 3 days. Pt denies N/V/D, denies fever, reports decreased urine output. Denies fever. Denies otc meds pta. Pt ambulatory

## 2018-11-03 NOTE — ED Provider Notes (Signed)
[facility] EMERGENCY DEPARTMENT Provider Note   CSN: [id number] Arrival date & time: 11/03/18  1452     History   Chief Complaint Chief Complaint  Patient presents with   Abdominal Pain    HPI Mary Randall is a 63 y.o. female.     HPI  63 year old female with history of breast cancer, hypertension comes into the ER with chief complaint of left-sided abdominal pain.  Patient has been having abdominal pain for the last 2 days.  The pain is constant, with no specific evoking, aggravating or relieving factors.  She has no history of same type of pain but has had pyelonephritis in the past.  She has no associated nausea, vomiting, fevers, chills, bloody stools and denies any burning with urination or blood in the urine.  Past Medical History:  Diagnosis Date   Angina effort    none recent   Arthritis    "right knee" (03/16/2015)   Breast cancer, left ([place]) dx'd 2016   Coronary vasospasm ([place]) 2010; 03/16/2014   Prinz-Metal's angina   Family history of breast cancer    Family history of breast cancer in female    Family history of pancreatic cancer    Hx of radiation therapy 05/17/15- 06/28/15   Left Breast   Hypertension    NSTEMI (non-ST elevated myocardial infarction) ([place]) 03/16/2014   non-obs CAD, spasm seen   Personal history of chemotherapy    Personal history of radiation therapy    Pneumonia 2009    Patient Active Problem List   Diagnosis Date Noted   Hypercalcemia 03/06/2016   UTI (urinary tract infection) 12/02/2014   Chemotherapy induced nausea and vomiting 11/03/2014   Anemia due to antineoplastic chemotherapy 11/03/2014   Genetic testing 10/13/2014   Family history of breast cancer    Family history of pancreatic cancer    Family history of breast cancer in female    Breast cancer of upper-inner quadrant of left female breast ([place]) 08/16/2014   BP (high blood pressure) 04/17/2014   NSTEMI (non-ST elevated myocardial  infarction) ([place]) 03/16/2014   Prinzmetal variant angina ([place])    Chest pain 09/04/2012   Essential hypertension, benign 09/04/2012   Angina pectoris with documented spasm ([place]) 03/05/2012   Coronary artery spasm ([place]) 03/05/2012   Arthritis of knee, degenerative 12/04/2011    Past Surgical History:  Procedure Laterality Date   BREAST BIOPSY Left 08/06/14   BREAST LUMPECTOMY Left 2017   chemo and radiation   BREAST LUMPECTOMY WITH NEEDLE LOCALIZATION AND AXILLARY LYMPH NODE DISSECTION Left 03/15/2015   Procedure: LEFT BREAST LUMPECTOMY WITH BRACKETED NEEDLE LOCALIZATION AND AXILLARY LYMPH NODE DISSECTION;  Surgeon: Autumn Messing III, MD;  Location: [facility];  Service: General;  Laterality: Left;   BUNIONECTOMY Bilateral    CARDIAC CATHETERIZATION  2010; 03/16/2014   ECTOPIC PREGNANCY SURGERY  1991   Tubal pregnancy   LEFT HEART CATHETERIZATION WITH CORONARY ANGIOGRAM N/A 03/16/2014   Procedure: LEFT HEART CATHETERIZATION WITH CORONARY ANGIOGRAM;  Surgeon: Sherren Mocha, MD; Non-obs CAD, EF 55%   PORT-A-CATH REMOVAL Right 03/15/2015   Procedure: REMOVAL PORT-A-CATH;  Surgeon: Autumn Messing III, MD;  Location: [facility];  Service: General;  Laterality: Right;   PORTACATH PLACEMENT N/A 08/28/2014   Procedure: INSERTION PORT-A-CATH;  Surgeon: Autumn Messing III, MD;  Location: [facility];  Service: General;  Laterality: N/A;   RE-EXCISION OF BREAST CANCER,SUPERIOR MARGINS Left 04/21/2015   Procedure: RE-EXCISION OF BREAST CANCER, INFERIOR MARGIN;  Surgeon: Autumn Messing III, MD;  Location: [facility];  Service: General;  Laterality: Left;   TUBAL LIGATION  1981     OB History   No obstetric history on file.      Home Medications    Prior to Admission medications   Medication Sig Start Date End Date Taking? Authorizing Provider  amLODipine (NORVASC) 5 MG tablet Take 1 tablet (5 mg total) by mouth daily. 09/10/18   Richardson Dopp T, PA-C  atorvastatin (LIPITOR) 40 MG tablet TAKE 1 TABLET BY MOUTH ONCE  DAILY AT 6 PM 12/31/17   Sherren Mocha, MD  cephALEXin (KEFLEX) 500 MG capsule Take 1 capsule (500 mg total) by mouth 4 (four) times daily. 11/03/18   Varney Biles, MD  hydrochlorothiazide (HYDRODIURIL) 25 MG tablet TAKE 1 TABLET BY MOUTH ONCE DAILY 12/11/17   Sherren Mocha, MD  irbesartan (AVAPRO) 150 MG tablet Take 1 tablet (150 mg total) by mouth daily. 09/10/18   Richardson Dopp T, PA-C  isosorbide mononitrate (IMDUR) 30 MG 24 hr tablet TAKE 1 TABLET BY MOUTH ONCE DAILY 10/14/18   Sherren Mocha, MD  letrozole [facility]) 2.5 MG tablet Take 1 tablet (2.5 mg total) by mouth daily. 10/17/17   Truitt Merle, MD  nitroGLYCERIN (NITROSTAT) 0.4 MG SL tablet Place 1 tablet (0.4 mg total) under the tongue every 5 (five) minutes as needed for chest pain. 08/07/16   Sherren Mocha, MD  potassium chloride (K-DUR) 10 MEQ tablet TAKE 1 TABLET BY MOUTH EVERY EVENING 12/28/17   Sherren Mocha, MD    Family History Family History  Problem Relation Age of Onset   Pancreatic cancer Brother 53   Breast cancer Brother 74   Hypertension Mother    Diabetes type II Mother    Colon cancer Mother 28   CVA Father    Healthy Sister    Healthy Sister    Breast cancer Cousin     Social History Social History   Tobacco Use   Smoking status: Never Smoker   Smokeless tobacco: Never Used  Substance Use Topics   Alcohol use: No   Drug use: No     Allergies   Carvedilol, Codeine, Percocet [oxycodone-acetaminophen], Adhesive [tape], and Compazine [prochlorperazine]   Review of Systems Review of Systems  Constitutional: Positive for activity change.  Gastrointestinal: Positive for abdominal pain.  Genitourinary: Negative for dysuria and flank pain.  Allergic/Immunologic: Negative for immunocompromised state.  All other systems reviewed and are negative.    Physical Exam Updated Vital Signs BP (!) 124/91 (BP Location: Right Arm)    Pulse 64    Temp 97.8 F (36.6 C) (Oral)    Resp 18    Ht  5\' 6"  (1.676 m)    Wt 81.2 kg    SpO2 98%    BMI 28.89 kg/m   Physical Exam Vitals signs and nursing note reviewed.  Constitutional:      Appearance: She is well-developed.  HENT:     Head: Normocephalic and atraumatic.  Neck:     Musculoskeletal: Normal range of motion and neck supple.  Cardiovascular:     Rate and Rhythm: Normal rate.  Pulmonary:     Effort: Pulmonary effort is normal.  Abdominal:     General: Bowel sounds are normal.     Tenderness: There is abdominal tenderness in the left lower quadrant. There is no guarding or rebound.  Skin:    General: Skin is warm and dry.  Neurological:     Mental Status: She is alert and oriented to person,  place, and time.      ED Treatments / Results  Labs (all labs ordered are listed, but only abnormal results are displayed) Labs Reviewed  URINALYSIS, ROUTINE W REFLEX MICROSCOPIC - Abnormal; Notable for the following components:      Result Value   APPearance CLOUDY (*)    Nitrite POSITIVE (*)    Leukocytes,Ua TRACE (*)    All other components within normal limits  CBC WITH DIFFERENTIAL/PLATELET - Abnormal; Notable for the following components:   WBC 3.8 (*)    RBC 2.67 (*)    Hemoglobin 10.9 (*)    HCT 32.3 (*)    MCV 121.0 (*)    MCH 40.8 (*)    All other components within normal limits  BASIC METABOLIC PANEL - Abnormal; Notable for the following components:   Glucose, Bld 100 (*)    Creatinine, Ser 1.03 (*)    GFR calc non Af Amer 58 (*)    All other components within normal limits  URINALYSIS, MICROSCOPIC (REFLEX) - Abnormal; Notable for the following components:   Bacteria, UA MANY (*)    All other components within normal limits    EKG None  Radiology Ct Abdomen Pelvis W Contrast  Result Date: 11/03/2018 CLINICAL DATA:  Lower abdominal pain for 3 days, history breast cancer, hypertension, and STEMI EXAM: CT ABDOMEN AND PELVIS WITH CONTRAST TECHNIQUE: Multidetector CT imaging of the abdomen and pelvis was  performed using the standard protocol following bolus administration of intravenous contrast. Sagittal and coronal MPR images reconstructed from axial data set. CONTRAST:  145mL OMNIPAQUE IOHEXOL 300 MG/ML SOLN IV. No oral contrast. COMPARISON:  08/12/2015 FINDINGS: Lower chest: Minimal dependent atelectasis at lung bases Hepatobiliary: Partially contracted gallbladder. Liver unremarkable. Pancreas: Pancreatic atrophy. Spleen: Normal appearance Adrenals/Urinary Tract: Adrenal glands unremarkable. Small RIGHT renal cysts. 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 Kidneys, ureters, and bladder otherwise normal appearance Stomach/Bowel: Normal appendix. Bowel loops unremarkable. Wall thickening of the gastric cardia and GE junction, with probable small hiatal hernia, question prior anti-reflux surgery versus mass. Vascular/Lymphatic: Aorta normal caliber. Circumaortic LEFT renal vein. Vascular structures patent. Numerous pelvic phleboliths. No adenopathy. Atherosclerotic calcifications of distal abdominal aorta and iliac arteries. Reproductive: Unremarkable uterus and adnexa Other: Small umbilical hernia containing fat. Minimal stranding of mesentery question fibrosing mesenteritis unchanged. No free air or free fluid. Musculoskeletal: Osseous demineralization with facet degenerative changes lumbar spine. IMPRESSION: Wall thickening at gastric cardia/GE junction versus sequela of prior anti-reflux surgery, recommend correlation with patient history; in the absence of prior surgery, recommend upper endoscopy to exclude gastric neoplasm. 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 Small umbilical hernia containing fat. Electronically Signed   By: Lavonia Dana M.D.   On: 11/03/2018 17:01    Procedures Procedures (including critical care time)  Medications Ordered in ED Medications  iohexol (OMNIPAQUE) 300 MG/ML solution 100 mL (100 mLs Intravenous Contrast Given 11/03/18  1637)  cephALEXin (KEFLEX) capsule 500 mg (500 mg Oral Given 11/03/18 1710)     Initial Impression / Assessment and Plan / ED Course  I have reviewed the triage vital signs and the nursing notes.  Pertinent labs & imaging results that were available during my care of the patient were reviewed by me and considered in my medical decision making (see chart for details).  Clinical Course as of Nov 03 1934  Sun Nov 03, 2018  1714 CT is negative for any acute findings.  Results of the ED work-up discussed with  the patient.  We will start her on Keflex.  There is a nonspecific finding around the GE junction.  Given that she has history of breast cancer, we have advised her to follow-up with GI doctor.  CT ABDOMEN PELVIS W CONTRAST [AN]    Clinical Course User Index [AN] Varney Biles, MD       63 year old female comes in a chief complaint of left lower quadrant abdominal pain.  She has no history of similar symptoms in the past.  CT scan from 2016 did not reveal diverticulosis.  She has had kidney infection in the past but denies any burning with urination or blood in the urine.  She also has no history of kidney stone.  UA shows positive nitrite.  However, clinical suspicion for UTI is lower than diverticulitis.  Patient has agreed to get a CT scan now rather than wait and watch approach.  If the CT scan is negative for any acute findings and we will probably start her on Keflex, as this could be cystitis.  Final Clinical Impressions(s) / ED Diagnoses   Final diagnoses:  Acute cystitis without hematuria  Left lower quadrant abdominal tenderness with rebound tenderness    ED Discharge Orders         Ordered    cephALEXin (KEFLEX) 500 MG capsule  4 times daily     11/03/18 1805           Varney Biles, MD 11/03/18 1937

## 2018-11-03 NOTE — ED Notes (Signed)
MD at bedside. 

## 2018-11-03 NOTE — Discharge Instructions (Signed)
You are seen in the ER for the abdominal pain.  Urine analysis does show signs of infection, however we did get a CT scan for further assessment.  There is no evidence of condition called diverticulitis but we did see some swelling at the junction of your stomach and esophagus.  We recommend that you follow-up with GI doctor.

## 2018-11-05 DIAGNOSIS — R1032 Left lower quadrant pain: Secondary | ICD-10-CM | POA: Diagnosis not present

## 2018-11-05 DIAGNOSIS — K3189 Other diseases of stomach and duodenum: Secondary | ICD-10-CM | POA: Diagnosis not present

## 2018-11-05 DIAGNOSIS — D72819 Decreased white blood cell count, unspecified: Secondary | ICD-10-CM | POA: Diagnosis not present

## 2018-11-05 DIAGNOSIS — R7989 Other specified abnormal findings of blood chemistry: Secondary | ICD-10-CM | POA: Diagnosis not present

## 2018-11-06 DIAGNOSIS — R933 Abnormal findings on diagnostic imaging of other parts of digestive tract: Secondary | ICD-10-CM | POA: Diagnosis not present

## 2018-11-06 NOTE — Progress Notes (Signed)
[place]   [phone] [phone]   Clinic Follow up Note   Patient Care Team: Rankins, Bill Salinas, MD as PCP - General (Family Medicine) Sherren Mocha, MD as PCP - Cardiology (Cardiology) Jovita Kussmaul, MD as Consulting Physician (General Surgery) 11/07/2018  CHIEF COMPLAINT: Follow-up left breast cancer  SUMMARY OF ONCOLOGIC HISTORY: Oncology History Overview Note  Breast cancer of upper-inner quadrant of left female breast [facility])   Staging form: Breast, AJCC 7th Edition     Clinical: Stage IIIA (T3, N1, M0) - Unsigned     Pathologic stage from 03/15/2015: Stage IIA (yT1c, N1a, cM0) - Signed by Truitt Merle, MD on 04/02/2015     Breast cancer of upper-inner quadrant of left female breast ([place])  08/06/2014 Initial Diagnosis   Breast 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   08/06/2014 Receptors her2   ER 100% positive, PR 5% positive, HER2-, Ki-67 50%   08/06/2014 Mammogram   Diagnostic mammogram and ultrasound showed left breast mass with associated calcification measuring 5.5 cm, indeterminate a  left axillary lymph node was cortical thickening, circumscribed low density oval 12m mass at 12:00 within the left breast.   09/03/2014 - 01/26/2015 Neo-Adjuvant Chemotherapy   ddAC every 2 weeks X4, followed by weekly Taxol X12    09/23/2014 Procedure   Genetic testing: Negative. Genes evaluated: ATM, BARD1, BRCA1, BRCA2, BRIP1, CDH1, CHEK2, EPCAM, FANCC, MLH1, MSH2, MSH6, NBN, PALB2, PMS2, PTEN, RAD51C, RAD51D, TP53, & XRCC2.    03/15/2015 Surgery   left lumpectomy and axillary node dissection (Marlou Starks   03/15/2015 Pathology Results   left lumpectomy showed G2 invasive ductal carcinoma, 2cm, G2, and DCIS, G2. the lateral margin was positive for invasive tumor, 1 out of  15 nodes was positive.     ypT1c,ypN1a: Stage IIA    04/21/2015 Surgery   Re-excision for positive margin. Surgical path was negative for malignancy.   05/17/2015 - 06/28/2015 Radiation Therapy   adjuvant breast radiation (Isidore Moos. Left breast treated to 50 Gy in 25 fractions.  Left SCLV and PAB treated to 45 Gy in 25 fractions.  Left breast boost treated to 10 Gy in 5 fractions    07/12/2015 -  Anti-estrogen oral therapy   Letrozole 2.5 mg once daily. Planned duration of therapy: 10 years.    08/19/2015 Imaging   DEXA scan: Normal. (T-score -1.0)   08/24/2015 Mammogram   Diagnostic mammogram: No evidence for malignancy.    09/18/2016 Mammogram   IMPRESSION: Stable lumpectomy changes on the left. No evidence of malignancy bilaterally.   09/20/2017 Mammogram   09/20/2017 Mammogram IMPRESSION: Stable left breast lumpectomy site. No mammographic evidence of malignancy in the bilateral breasts.   09/26/2018 Mammogram   FINDINGS: There are lumpectomy changes in the lower inner left breast. Surgical changes in the inferior left axilla. No suspicious mass, suspicious microcalcification, or architectural distortion is identified to suggest malignancy in either breast. IMPRESSION: No evidence of malignancy in either breast.     CURRENT THERAPY: Letrozole 2.5 mg once daily, started on 07/12/2015  INTERVAL HISTORY: Ms. KTostensonreturns for follow-up as scheduled.  She was last seen by Dr. FBurr Medicoon 10/17/2017.  She was recently seen in the ED on 11/1 for abdominal pain.  UA was nitrite positive.  CT AP showed thickening at gastric cardia/GE junction vs  sequela of prior antireflux surgery.  She was given Keflex antibiotic and told to follow-up with GI. She spoke to GI provider but does not recall the name, planning to do EGD and colonoscopy soon. She feels well. Denies new concerns in her breast such as new lump, nipple discharge or inversion. Denies change in appetite, weight, or BM. Denies GI or vaginal  bleeding. No dysphagia. Denies hot flash or bone/joint pain. She sometimes feels nauseous if she takes letrozole late, but feels better once she takes it. Denies any side effects of letrozole. Denies fever, chills, cough, chest pain, dyspnea, swelling. She has not been taking calcium or vitamin D.    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   Family history of breast cancer    Family history of breast cancer in female    Family history of pancreatic cancer    Hx of radiation therapy 05/17/15- 06/28/15   Left Breast   Hypertension    NSTEMI (non-ST elevated myocardial infarction) ([place]) 03/16/2014   non-obs CAD, spasm seen   Personal history of chemotherapy    Personal history of radiation therapy    Pneumonia 2009    SURGICAL HISTORY: Past Surgical History:  Procedure Laterality Date   BREAST BIOPSY Left 08/06/14   BREAST LUMPECTOMY Left 2017   chemo and radiation   BREAST LUMPECTOMY WITH NEEDLE LOCALIZATION AND AXILLARY LYMPH NODE DISSECTION Left 03/15/2015   Procedure: LEFT BREAST LUMPECTOMY WITH BRACKETED NEEDLE LOCALIZATION AND AXILLARY LYMPH NODE DISSECTION;  Surgeon: Autumn Messing III, MD;  Location: [facility];  Service: General;  Laterality: Left;   BUNIONECTOMY Bilateral    CARDIAC CATHETERIZATION  2010; 03/16/2014   ECTOPIC PREGNANCY SURGERY  1991   Tubal pregnancy   LEFT HEART CATHETERIZATION WITH CORONARY ANGIOGRAM N/A 03/16/2014   Procedure: LEFT HEART CATHETERIZATION WITH CORONARY ANGIOGRAM;  Surgeon: Sherren Mocha, MD; Non-obs CAD, EF 55%   PORT-A-CATH REMOVAL Right 03/15/2015   Procedure: REMOVAL PORT-A-CATH;  Surgeon: Autumn Messing III, MD;  Location: [facility];  Service: General;  Laterality: Right;   PORTACATH PLACEMENT N/A 08/28/2014   Procedure: INSERTION PORT-A-CATH;   Surgeon: Autumn Messing III, MD;  Location: [facility];  Service: General;  Laterality: N/A;   RE-EXCISION OF BREAST CANCER,SUPERIOR MARGINS Left 04/21/2015   Procedure: RE-EXCISION OF BREAST CANCER, INFERIOR MARGIN;  Surgeon: Autumn Messing III, MD;  Location: [facility];  Service: General;  Laterality: Left;   [place]    I have reviewed the social history and family history with the patient and they are unchanged from previous note.  ALLERGIES:  is allergic to carvedilol; codeine; percocet [oxycodone-acetaminophen]; adhesive [tape]; and compazine [prochlorperazine].  MEDICATIONS:  Current Outpatient Medications  Medication Sig Dispense Refill   amLODipine (NORVASC) 5 MG tablet Take 1 tablet (5 mg total) by mouth daily. 90 tablet 3   atorvastatin (LIPITOR) 40 MG tablet TAKE 1 TABLET BY MOUTH ONCE DAILY AT 6 PM 90 tablet 2   cephALEXin (KEFLEX) 500 MG capsule Take 1 capsule (500 mg total) by mouth 4 (four) times daily. 28 capsule 0   hydrochlorothiazide (HYDRODIURIL) 25 MG tablet TAKE 1 TABLET BY MOUTH ONCE DAILY 90 tablet 2   irbesartan (AVAPRO) 150 MG tablet Take 1 tablet (150 mg total) by mouth daily. [place]  tablet 3   isosorbide mononitrate (IMDUR) 30 MG 24 hr tablet TAKE 1 TABLET BY MOUTH ONCE DAILY 90 tablet 3   letrozole (FEMARA) 2.5 MG tablet Take 1 tablet (2.5 mg total) by mouth daily. 90 tablet 3   nitroGLYCERIN (NITROSTAT) 0.4 MG SL tablet Place 1 tablet (0.4 mg total) under the tongue every 5 (five) minutes as needed for chest pain. 25 tablet 12   potassium chloride (K-DUR) 10 MEQ tablet TAKE 1 TABLET BY MOUTH EVERY EVENING 90 tablet 2   No current facility-administered medications for this visit.     PHYSICAL EXAMINATION: ECOG PERFORMANCE STATUS: 1 - Symptomatic but completely ambulatory  Vitals:   11/07/18 1113  BP: 133/86  Pulse: 69  Resp: 18  Temp: 98.5 F (36.9 C)  SpO2: 98%   Filed Weights   11/07/18 1113  Weight: 175 lb 3.2 oz (79.5 kg)    GENERAL:alert,  no distress and comfortable SKIN: no rash  EYES: sclera clear LYMPH:  no palpable cervical, supraclavicular lymphadenopathy LUNGS: clear with normal breathing effort HEART: regular rate & rhythm, no lower extremity edema ABDOMEN:abdomen soft, non-tender and normal bowel sounds NEURO: alert & oriented x 3 with fluent speech, no focal motor deficits BREAST: s/p left lumpectomy, incisions completely healed. No palpable mass in either breast or axilla that I could appreciate.  PAC scar to right chest   LABORATORY DATA:  I have reviewed the data as listed CBC Latest Ref Rng & Units 11/07/2018 11/03/2018 10/17/2017  WBC 4.0 - 10.5 K/uL 3.9(L) 3.8(L) 4.2  Hemoglobin 12.0 - 15.0 g/dL 11.6(L) 10.9(L) 12.6  Hematocrit 36.0 - 46.0 % 33.1(L) 32.3(L) 39.1  Platelets 150 - 400 K/uL 217 215 213     CMP Latest Ref Rng & Units 11/07/2018 11/03/2018 10/17/2017  Glucose 70 - 99 mg/dL 117(H) 100(H) 90  BUN 8 - 23 mg/dL _0 Creatinine 0.44 - 1.00 mg/dL 0.99 1.03(H) 1.03(H)  Sodium 135 - 145 mmol/L 143 136 142  Potassium 3.5 - 5.1 mmol/L 3.8 3.5 4.0  Chloride 98 - 111 mmol/L 105 103 105  CO2 22 - 32 mmol/L _1 Calcium 8.9 - 10.3 mg/dL 10.4(H) 10.2 10.8(H)  Total Protein 6.5 - 8.1 g/dL 6.9 - 7.4  Total Bilirubin 0.3 - 1.2 mg/dL 0.7 - 1.2  Alkaline Phos 38 - 126 U/L 76 - 90  AST 15 - 41 U/L 15 - 15  ALT 0 - 44 U/L 12 - 10      RADIOGRAPHIC STUDIES: I have personally reviewed the radiological images as listed and agreed with the findings in the report. No results found.   ASSESSMENT & PLAN: 63 y.o. African-American female, postmenopausal, healthy and fit, who was found to have a left breast mass and a biopsy confirmed node metastasis by screening mammogram.  1. Left breast invasive ductal carcinoma, cT3N1M0, stage IIIA, ER+/PR+/HER2-, ypT1cN1aM0 after neoadjuvant chemotherapy -diagnosed 08/2014, completed ddAC-T followed by lumpectomy and reexcision for positive margins, final path was  negative for malignant cells. -Due to the locally advanced stage, persistent node positive, she has high recurrence risk  -she completed adjuvant radiation to reduce risk of local recurrence  -she started adjuvant letrozole in 07/2015, tolerating well. Plan to continue 7-10 years total  -she has not seen Dr. Marlou Starks this year -annual mammogram was negative in 09/2018 -Ms. Lady is clinically doing well. Breast exam is benign. Labs unremarkable except mild leukopenia and macrocytic anemia Hgb 11.6, MCV 117.8, which is new for her  from 4 days ago when in the ED for abdominal pain. She was treated for UTI. It's possible her anemia and leukopenia are related to acute infection. -no clinical concern for breast cancer recurrence  -continue breast cancer surveillance and letrozole. I refilled for her -plan to repeat labs in 3 months and check B12, folate;  -F/u in 6 months as she does not have f/u with Dr. Marlou Starks scheduled, or sooner if needed  2. Bone health -A bone density scan from August 2017 was normal, T score -1.0. but showed T-score -1.3 in forearm in 10/25/2017. She is considered osteopenic.  -due to hypercalcemia she did not start calcium supplement, and thought she was also not supposed to take vitamin D.  -I encouraged her to start vitamin D supplement and increase weight bearing exercise   3. Cancer genetics -She has very strong family history of breast, colon and pancreatic cancer, all in first-degree, I recommend her to see genetic counselor to ruled out inheritable breast cancer syndrome.  -her genetic testing result was normal  4. History of non-STEMI in 03/2014 -She had cardiac catheterization, which showed mild diffuse coronary artery disease, but no need intervention.  -She will follow-up with her cardiologist Dr. 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 Hypercalcemia -Her calcium has been around 11 -Her vitamin D level is low, PTH was  normal. -she does not take calcium supplement  7. Thickening at gastric cardia/GE junction -found on CT AP in ED on 11/1 -she is under the care of GI, planning to undergo EGD and colonoscopy soon  PLAN: -Labs, mammogram reviewed -Continue letrozole, refilled  -GI work up as planned  -Begin vitamin D, hold calcium  -Repeat labs in 3 months -Lab, f/u in 6 months  -F/u PCP as scheduled   No problem-specific Assessment & Plan notes found for this encounter.   Orders Placed This Encounter  Procedures   Folate RBC    Standing Status:   Standing    Number of Occurrences:   1    Standing Expiration Date:   11/07/2019   Vitamin B12    Standing Status:   Standing    Number of Occurrences:   1    Standing Expiration Date:   11/07/2019   Reticulocytes    Standing Status:   Standing    Number of Occurrences:   1    Standing Expiration Date:   11/07/2019   All questions were answered. The patient knows to call the clinic with any problems, questions or concerns. No barriers to learning was detected. I spent 20 minutes counseling the patient face to face. The total time spent in the appointment was 25 minutes and more than 50% was on counseling and review of test results     Alla Feeling, NP 11/07/18

## 2018-11-07 ENCOUNTER — Inpatient Hospital Stay: Payer: Federal, State, Local not specified - PPO | Attending: Hematology

## 2018-11-07 ENCOUNTER — Encounter: Payer: Self-pay | Admitting: Nurse Practitioner

## 2018-11-07 ENCOUNTER — Inpatient Hospital Stay: Payer: Federal, State, Local not specified - PPO | Admitting: Nurse Practitioner

## 2018-11-07 ENCOUNTER — Other Ambulatory Visit: Payer: Self-pay

## 2018-11-07 VITALS — BP 133/86 | HR 69 | Temp 98.5°F | Resp 18 | Ht 66.0 in | Wt 175.2 lb

## 2018-11-07 DIAGNOSIS — G620 Drug-induced polyneuropathy: Secondary | ICD-10-CM | POA: Insufficient documentation

## 2018-11-07 DIAGNOSIS — Z170 Estrogen receptor positive status [ER+]: Secondary | ICD-10-CM | POA: Insufficient documentation

## 2018-11-07 DIAGNOSIS — Z79811 Long term (current) use of aromatase inhibitors: Secondary | ICD-10-CM | POA: Diagnosis not present

## 2018-11-07 DIAGNOSIS — C50212 Malignant neoplasm of upper-inner quadrant of left female breast: Secondary | ICD-10-CM | POA: Diagnosis not present

## 2018-11-07 DIAGNOSIS — Z79899 Other long term (current) drug therapy: Secondary | ICD-10-CM | POA: Insufficient documentation

## 2018-11-07 DIAGNOSIS — C773 Secondary and unspecified malignant neoplasm of axilla and upper limb lymph nodes: Secondary | ICD-10-CM | POA: Diagnosis not present

## 2018-11-07 DIAGNOSIS — D539 Nutritional anemia, unspecified: Secondary | ICD-10-CM | POA: Diagnosis not present

## 2018-11-07 DIAGNOSIS — M8580 Other specified disorders of bone density and structure, unspecified site: Secondary | ICD-10-CM | POA: Diagnosis not present

## 2018-11-07 LAB — CBC WITH DIFFERENTIAL/PLATELET
Abs Immature Granulocytes: 0.01 10*3/uL (ref 0.00–0.07)
Basophils Absolute: 0 10*3/uL (ref 0.0–0.1)
Basophils Relative: 0 %
Eosinophils Absolute: 0.1 10*3/uL (ref 0.0–0.5)
Eosinophils Relative: 2 %
HCT: 33.1 % — ABNORMAL LOW (ref 36.0–46.0)
Hemoglobin: 11.6 g/dL — ABNORMAL LOW (ref 12.0–15.0)
Immature Granulocytes: 0 %
Lymphocytes Relative: 32 %
Lymphs Abs: 1.2 10*3/uL (ref 0.7–4.0)
MCH: 41.3 pg — ABNORMAL HIGH (ref 26.0–34.0)
MCHC: 35 g/dL (ref 30.0–36.0)
MCV: 117.8 fL — ABNORMAL HIGH (ref 80.0–100.0)
Monocytes Absolute: 0.2 10*3/uL (ref 0.1–1.0)
Monocytes Relative: 5 %
Neutro Abs: 2.4 10*3/uL (ref 1.7–7.7)
Neutrophils Relative %: 61 %
Platelets: 217 10*3/uL (ref 150–400)
RBC: 2.81 MIL/uL — ABNORMAL LOW (ref 3.87–5.11)
RDW: 12.9 % (ref 11.5–15.5)
WBC: 3.9 10*3/uL — ABNORMAL LOW (ref 4.0–10.5)
nRBC: 0 % (ref 0.0–0.2)

## 2018-11-07 LAB — COMPREHENSIVE METABOLIC PANEL
ALT: 12 U/L (ref 0–44)
AST: 15 U/L (ref 15–41)
Albumin: 4 g/dL (ref 3.5–5.0)
Alkaline Phosphatase: 76 U/L (ref 38–126)
Anion gap: 11 (ref 5–15)
BUN: 13 mg/dL (ref 8–23)
CO2: 27 mmol/L (ref 22–32)
Calcium: 10.4 mg/dL — ABNORMAL HIGH (ref 8.9–10.3)
Chloride: 105 mmol/L (ref 98–111)
Creatinine, Ser: 0.99 mg/dL (ref 0.44–1.00)
GFR calc Af Amer: >60 mL/min (ref >60)
GFR calc non Af Amer: >60 mL/min (ref >60)
Glucose, Bld: 117 mg/dL — ABNORMAL HIGH (ref 70–99)
Potassium: 3.8 mmol/L (ref 3.5–5.1)
Sodium: 143 mmol/L (ref 135–145)
Total Bilirubin: 0.7 mg/dL (ref 0.3–1.2)
Total Protein: 6.9 g/dL (ref 6.5–8.1)

## 2018-11-07 MED ORDER — LETROZOLE 2.5 MG PO TABS: 2.5000 mg | ORAL_TABLET | Freq: Every day | ORAL | 3 refills | Status: DC

## 2018-11-08 ENCOUNTER — Telehealth: Payer: Self-pay | Admitting: Nurse Practitioner

## 2018-11-08 NOTE — Telephone Encounter (Signed)
Scheduled appt per 1/15 los. Spoke with pt and she is aware of her appt date and time. 

## 2018-11-13 ENCOUNTER — Other Ambulatory Visit: Payer: Self-pay | Admitting: Hematology

## 2018-11-13 DIAGNOSIS — C50212 Malignant neoplasm of upper-inner quadrant of left female breast: Secondary | ICD-10-CM

## 2018-11-13 DIAGNOSIS — Z170 Estrogen receptor positive status [ER+]: Secondary | ICD-10-CM

## 2018-11-20 DIAGNOSIS — Z1159 Encounter for screening for other viral diseases: Secondary | ICD-10-CM | POA: Diagnosis not present

## 2018-11-25 DIAGNOSIS — R933 Abnormal findings on diagnostic imaging of other parts of digestive tract: Secondary | ICD-10-CM | POA: Diagnosis not present

## 2018-11-25 DIAGNOSIS — Z1211 Encounter for screening for malignant neoplasm of colon: Secondary | ICD-10-CM | POA: Diagnosis not present

## 2019-01-03 ENCOUNTER — Other Ambulatory Visit: Payer: Self-pay | Admitting: Cardiovascular Disease

## 2019-02-07 ENCOUNTER — Inpatient Hospital Stay: Payer: Federal, State, Local not specified - PPO | Attending: Hematology

## 2019-02-07 ENCOUNTER — Other Ambulatory Visit: Payer: Self-pay

## 2019-02-07 DIAGNOSIS — Z8673 Personal history of transient ischemic attack (TIA), and cerebral infarction without residual deficits: Secondary | ICD-10-CM | POA: Insufficient documentation

## 2019-02-07 DIAGNOSIS — E538 Deficiency of other specified B group vitamins: Secondary | ICD-10-CM | POA: Diagnosis not present

## 2019-02-07 DIAGNOSIS — M8580 Other specified disorders of bone density and structure, unspecified site: Secondary | ICD-10-CM | POA: Diagnosis not present

## 2019-02-07 DIAGNOSIS — R933 Abnormal findings on diagnostic imaging of other parts of digestive tract: Secondary | ICD-10-CM | POA: Insufficient documentation

## 2019-02-07 DIAGNOSIS — T451X5A Adverse effect of antineoplastic and immunosuppressive drugs, initial encounter: Secondary | ICD-10-CM | POA: Insufficient documentation

## 2019-02-07 DIAGNOSIS — G620 Drug-induced polyneuropathy: Secondary | ICD-10-CM | POA: Insufficient documentation

## 2019-02-07 DIAGNOSIS — C50212 Malignant neoplasm of upper-inner quadrant of left female breast: Secondary | ICD-10-CM | POA: Diagnosis not present

## 2019-02-07 DIAGNOSIS — Z803 Family history of malignant neoplasm of breast: Secondary | ICD-10-CM | POA: Diagnosis not present

## 2019-02-07 DIAGNOSIS — Z800 Family history of malignant neoplasm of digestive organs: Secondary | ICD-10-CM | POA: Diagnosis not present

## 2019-02-07 LAB — VITAMIN B12: Vitamin B-12: 97 pg/mL — ABNORMAL LOW (ref 180–914)

## 2019-02-07 LAB — RETICULOCYTES
Immature Retic Fract: 11.2 % (ref 2.3–15.9)
RBC.: 2.33 MIL/uL — ABNORMAL LOW (ref 3.87–5.11)
Retic Count, Absolute: 23.8 10*3/uL (ref 19.0–186.0)
Retic Ct Pct: 1 % (ref 0.4–3.1)

## 2019-02-08 LAB — FOLATE RBC
Folate, Hemolysate: 292 ng/mL
Folate, RBC: 1007 ng/mL (ref >498)
Hematocrit: 29 % — ABNORMAL LOW (ref 34.0–46.6)

## 2019-02-14 ENCOUNTER — Telehealth: Payer: Self-pay

## 2019-02-14 ENCOUNTER — Telehealth: Payer: Self-pay | Admitting: Nurse Practitioner

## 2019-02-14 NOTE — Telephone Encounter (Signed)
TC to pt per Mary Rue NP to  let her know her B12 is low, and that Mary Randall recommens monthly b12 injections. This may explain her anemia from november. I let her know that Mary Randall will then repeat a CBC when she comes back for first injection. Patient agrees to injection. Sent schedule message for lab and B12 end of next week.

## 2019-02-14 NOTE — Telephone Encounter (Signed)
Scheduled appt per 2/12 sch message - pt unable to come in next wk - pt says next dayu off is 2/23 only able to come in on off days

## 2019-02-17 ENCOUNTER — Other Ambulatory Visit: Payer: Self-pay | Admitting: Nurse Practitioner

## 2019-02-17 DIAGNOSIS — E538 Deficiency of other specified B group vitamins: Secondary | ICD-10-CM

## 2019-02-25 ENCOUNTER — Telehealth: Payer: Self-pay

## 2019-02-25 ENCOUNTER — Inpatient Hospital Stay: Payer: Federal, State, Local not specified - PPO

## 2019-02-25 ENCOUNTER — Other Ambulatory Visit: Payer: Self-pay

## 2019-02-25 VITALS — BP 114/97 | HR 61 | Temp 98.2°F | Resp 18

## 2019-02-25 DIAGNOSIS — Z800 Family history of malignant neoplasm of digestive organs: Secondary | ICD-10-CM | POA: Diagnosis not present

## 2019-02-25 DIAGNOSIS — E538 Deficiency of other specified B group vitamins: Secondary | ICD-10-CM

## 2019-02-25 DIAGNOSIS — T451X5A Adverse effect of antineoplastic and immunosuppressive drugs, initial encounter: Secondary | ICD-10-CM | POA: Diagnosis not present

## 2019-02-25 DIAGNOSIS — M8580 Other specified disorders of bone density and structure, unspecified site: Secondary | ICD-10-CM | POA: Diagnosis not present

## 2019-02-25 DIAGNOSIS — Z803 Family history of malignant neoplasm of breast: Secondary | ICD-10-CM | POA: Diagnosis not present

## 2019-02-25 DIAGNOSIS — G620 Drug-induced polyneuropathy: Secondary | ICD-10-CM | POA: Diagnosis not present

## 2019-02-25 DIAGNOSIS — Z8673 Personal history of transient ischemic attack (TIA), and cerebral infarction without residual deficits: Secondary | ICD-10-CM | POA: Diagnosis not present

## 2019-02-25 DIAGNOSIS — C50212 Malignant neoplasm of upper-inner quadrant of left female breast: Secondary | ICD-10-CM | POA: Diagnosis not present

## 2019-02-25 DIAGNOSIS — R933 Abnormal findings on diagnostic imaging of other parts of digestive tract: Secondary | ICD-10-CM | POA: Diagnosis not present

## 2019-02-25 LAB — CBC WITH DIFFERENTIAL (CANCER CENTER ONLY)
Abs Immature Granulocytes: 0.01 10*3/uL (ref 0.00–0.07)
Basophils Absolute: 0 10*3/uL (ref 0.0–0.1)
Basophils Relative: 0 %
Eosinophils Absolute: 0 10*3/uL (ref 0.0–0.5)
Eosinophils Relative: 1 %
HCT: 29.2 % — ABNORMAL LOW (ref 36.0–46.0)
Hemoglobin: 10.3 g/dL — ABNORMAL LOW (ref 12.0–15.0)
Immature Granulocytes: 0 %
Lymphocytes Relative: 36 %
Lymphs Abs: 1.3 10*3/uL (ref 0.7–4.0)
MCH: 45.4 pg — ABNORMAL HIGH (ref 26.0–34.0)
MCHC: 35.3 g/dL (ref 30.0–36.0)
MCV: 128.6 fL — ABNORMAL HIGH (ref 80.0–100.0)
Monocytes Absolute: 0.1 10*3/uL (ref 0.1–1.0)
Monocytes Relative: 4 %
Neutro Abs: 2.1 10*3/uL (ref 1.7–7.7)
Neutrophils Relative %: 59 %
Platelet Count: 194 10*3/uL (ref 150–400)
RBC: 2.27 MIL/uL — ABNORMAL LOW (ref 3.87–5.11)
RDW: 13.4 % (ref 11.5–15.5)
WBC Count: 3.6 10*3/uL — ABNORMAL LOW (ref 4.0–10.5)
nRBC: 0.6 % — ABNORMAL HIGH (ref 0.0–0.2)

## 2019-02-25 MED ORDER — CYANOCOBALAMIN 1000 MCG/ML IJ SOLN
1000.0000 ug | Freq: Once | INTRAMUSCULAR | Status: AC
  Administered 2019-02-25: 14:00:00 1000 ug via INTRAMUSCULAR

## 2019-02-25 MED ORDER — CYANOCOBALAMIN 1000 MCG/ML IJ SOLN
INTRAMUSCULAR | Status: AC
  Filled 2019-02-25: qty 1

## 2019-02-25 NOTE — Patient Instructions (Signed)
Cyanocobalamin, Pyridoxine, and Folate What is this medicine? A multivitamin containing folic acid, vitamin B6, and vitamin B12. This medicine may be used for other purposes; ask your health care provider or pharmacist if you have questions. COMMON BRAND NAME(S): AllanFol RX, AllanTex, Av-Vite FB, B Complex with Folic Acid, ComBgen, FaBB, Folamin, Folastin, Folbalin, Folbee, Folbic, Folcaps, Folgard, Folgard RX, Folgard RX 2.2, Folplex, Folplex 2.2, Foltabs 800, Foltx, Homocysteine Formula, Niva-Fol, NuFol, TL Gard RX, Virt-Gard, Virt-Vite, Virt-Vite Forte, Vita-Respa What should I tell my health care provider before I take this medicine? They need to know if you have any of these conditions:  bleeding or clotting disorder  history of anemia of any type  other chronic health condition  an unusual or allergic reaction to vitamins, other medicines, foods, dyes, or preservatives  pregnant or trying to get pregnant  breast-feeding How should I use this medicine? Take by mouth with a glass of water. May take with food. Follow the directions on the prescription label. It is usually given once a day. Do not take your medicine more often than directed. Contact your pediatrician regarding the use of this medicine in children. Special care may be needed. Overdosage: If you think you have taken too much of this medicine contact a poison control center or emergency room at once. NOTE: This medicine is only for you. Do not share this medicine with others. What if I miss a dose? If you miss a dose, take it as soon as you can. If it is almost time for your next dose, take only that dose. Do not take double or extra doses. What may interact with this medicine?  levodopa This list may not describe all possible interactions. Give your health care provider a list of all the medicines, herbs, non-prescription drugs, or dietary supplements you use. Also tell them if you smoke, drink alcohol, or use illegal  drugs. Some items may interact with your medicine. What should I watch for while using this medicine? See your health care professional for regular checks on your progress. Remember that vitamin supplements do not replace the need for good nutrition from a balanced diet. What side effects may I notice from receiving this medicine? Side effects that you should report to your doctor or health care professional as soon as possible:  allergic reaction such as skin rash or difficulty breathing  vomiting Side effects that usually do not require medical attention (report to your doctor or health care professional if they continue or are bothersome):  nausea  stomach upset This list may not describe all possible side effects. Call your doctor for medical advice about side effects. You may report side effects to FDA at 1-800-FDA-1088. Where should I keep my medicine? Keep out of the reach of children. Most vitamins should be stored at controlled room temperature. Check your specific product directions. Protect from heat and moisture. Throw away any unused medicine after the expiration date. NOTE: This sheet is a summary. It may not cover all possible information. If you have questions about this medicine, talk to your doctor, pharmacist, or health care provider.  2020 Elsevier/Gold Standard (2007-02-09 00:59:55)  

## 2019-02-25 NOTE — Telephone Encounter (Signed)
-----   Message from Alla Feeling, NP sent at 02/25/2019  1:18 PM EST ----- Please let her know anemia is slightly worse from November 2020, proceed with monthly B12 as planned. We will repeat lab frequently to monitor.  Thanks, Regan Rakers

## 2019-02-25 NOTE — Telephone Encounter (Signed)
Per Mary Randall Pt called with lab results informed Pt that her HGB was 10.3  lower then in November which was 11.6 informed Pt that she will proceed with the B 12 injections as planned and monitor labs frequently. Pt. Verbalized understanding. No further problems or concerns noted

## 2019-03-06 ENCOUNTER — Telehealth: Payer: Self-pay | Admitting: Hematology

## 2019-03-06 NOTE — Telephone Encounter (Signed)
Rescheduled per 3/4 sch msg, per pt req. Called and spoke with pt, confirmed 3/18 appt

## 2019-03-19 ENCOUNTER — Other Ambulatory Visit: Payer: Self-pay

## 2019-03-19 DIAGNOSIS — D518 Other vitamin B12 deficiency anemias: Secondary | ICD-10-CM

## 2019-03-19 DIAGNOSIS — E538 Deficiency of other specified B group vitamins: Secondary | ICD-10-CM

## 2019-03-20 ENCOUNTER — Inpatient Hospital Stay: Payer: Federal, State, Local not specified - PPO

## 2019-03-20 ENCOUNTER — Telehealth: Payer: Self-pay

## 2019-03-20 ENCOUNTER — Other Ambulatory Visit: Payer: Self-pay

## 2019-03-20 ENCOUNTER — Inpatient Hospital Stay: Payer: Federal, State, Local not specified - PPO | Attending: Hematology

## 2019-03-20 DIAGNOSIS — Z170 Estrogen receptor positive status [ER+]: Secondary | ICD-10-CM | POA: Insufficient documentation

## 2019-03-20 DIAGNOSIS — D518 Other vitamin B12 deficiency anemias: Secondary | ICD-10-CM

## 2019-03-20 DIAGNOSIS — C50212 Malignant neoplasm of upper-inner quadrant of left female breast: Secondary | ICD-10-CM | POA: Diagnosis not present

## 2019-03-20 DIAGNOSIS — E538 Deficiency of other specified B group vitamins: Secondary | ICD-10-CM | POA: Insufficient documentation

## 2019-03-20 LAB — CBC WITH DIFFERENTIAL (CANCER CENTER ONLY)
Abs Immature Granulocytes: 0.01 10*3/uL (ref 0.00–0.07)
Basophils Absolute: 0 10*3/uL (ref 0.0–0.1)
Basophils Relative: 1 %
Eosinophils Absolute: 0.1 10*3/uL (ref 0.0–0.5)
Eosinophils Relative: 2 %
HCT: 35.9 % — ABNORMAL LOW (ref 36.0–46.0)
Hemoglobin: 12.2 g/dL (ref 12.0–15.0)
Immature Granulocytes: 0 %
Lymphocytes Relative: 38 %
Lymphs Abs: 1.4 10*3/uL (ref 0.7–4.0)
MCH: 38.7 pg — ABNORMAL HIGH (ref 26.0–34.0)
MCHC: 34 g/dL (ref 30.0–36.0)
MCV: 114 fL — ABNORMAL HIGH (ref 80.0–100.0)
Monocytes Absolute: 0.3 10*3/uL (ref 0.1–1.0)
Monocytes Relative: 9 %
Neutro Abs: 1.8 10*3/uL (ref 1.7–7.7)
Neutrophils Relative %: 50 %
Platelet Count: 177 10*3/uL (ref 150–400)
RBC: 3.15 MIL/uL — ABNORMAL LOW (ref 3.87–5.11)
RDW: 17.3 % — ABNORMAL HIGH (ref 11.5–15.5)
WBC Count: 3.6 10*3/uL — ABNORMAL LOW (ref 4.0–10.5)
nRBC: 0 % (ref 0.0–0.2)

## 2019-03-20 LAB — VITAMIN B12: Vitamin B-12: 241 pg/mL (ref 180–914)

## 2019-03-20 MED ORDER — CYANOCOBALAMIN 1000 MCG/ML IJ SOLN
1000.0000 ug | Freq: Once | INTRAMUSCULAR | Status: AC
  Administered 2019-03-20: 1000 ug via INTRAMUSCULAR

## 2019-03-20 MED ORDER — CYANOCOBALAMIN 1000 MCG/ML IJ SOLN
INTRAMUSCULAR | Status: AC
  Filled 2019-03-20: qty 1

## 2019-03-20 NOTE — Patient Instructions (Signed)
Cyanocobalamin, Pyridoxine, and Folate What is this medicine? A multivitamin containing folic acid, vitamin B6, and vitamin B12. This medicine may be used for other purposes; ask your health care provider or pharmacist if you have questions. COMMON BRAND NAME(S): AllanFol RX, AllanTex, Av-Vite FB, B Complex with Folic Acid, ComBgen, FaBB, Folamin, Folastin, Folbalin, Folbee, Folbic, Folcaps, Folgard, Folgard RX, Folgard RX 2.2, Folplex, Folplex 2.2, Foltabs 800, Foltx, Homocysteine Formula, Niva-Fol, NuFol, TL Gard RX, Virt-Gard, Virt-Vite, Virt-Vite Forte, Vita-Respa What should I tell my health care provider before I take this medicine? They need to know if you have any of these conditions:  bleeding or clotting disorder  history of anemia of any type  other chronic health condition  an unusual or allergic reaction to vitamins, other medicines, foods, dyes, or preservatives  pregnant or trying to get pregnant  breast-feeding How should I use this medicine? Take by mouth with a glass of water. May take with food. Follow the directions on the prescription label. It is usually given once a day. Do not take your medicine more often than directed. Contact your pediatrician regarding the use of this medicine in children. Special care may be needed. Overdosage: If you think you have taken too much of this medicine contact a poison control center or emergency room at once. NOTE: This medicine is only for you. Do not share this medicine with others. What if I miss a dose? If you miss a dose, take it as soon as you can. If it is almost time for your next dose, take only that dose. Do not take double or extra doses. What may interact with this medicine?  levodopa This list may not describe all possible interactions. Give your health care provider a list of all the medicines, herbs, non-prescription drugs, or dietary supplements you use. Also tell them if you smoke, drink alcohol, or use illegal  drugs. Some items may interact with your medicine. What should I watch for while using this medicine? See your health care professional for regular checks on your progress. Remember that vitamin supplements do not replace the need for good nutrition from a balanced diet. What side effects may I notice from receiving this medicine? Side effects that you should report to your doctor or health care professional as soon as possible:  allergic reaction such as skin rash or difficulty breathing  vomiting Side effects that usually do not require medical attention (report to your doctor or health care professional if they continue or are bothersome):  nausea  stomach upset This list may not describe all possible side effects. Call your doctor for medical advice about side effects. You may report side effects to FDA at 1-800-FDA-1088. Where should I keep my medicine? Keep out of the reach of children. Most vitamins should be stored at controlled room temperature. Check your specific product directions. Protect from heat and moisture. Throw away any unused medicine after the expiration date. NOTE: This sheet is a summary. It may not cover all possible information. If you have questions about this medicine, talk to your doctor, pharmacist, or health care provider.  2020 Elsevier/Gold Standard (2007-02-09 00:59:55)  

## 2019-03-20 NOTE — Telephone Encounter (Signed)
Pt. here for B12 injection today inquired about covid vaccine informed her if she didn't have any reactions to any other immunizations its ok. Pt stated she wanted to hear it from Dr. Doyce Loose Dr. Burr Medico she stated it was ok. Pt. Verbalized understanding

## 2019-03-25 ENCOUNTER — Ambulatory Visit: Payer: Federal, State, Local not specified - PPO

## 2019-03-25 ENCOUNTER — Other Ambulatory Visit: Payer: Federal, State, Local not specified - PPO

## 2019-03-26 ENCOUNTER — Telehealth: Payer: Self-pay | Admitting: Emergency Medicine

## 2019-03-26 NOTE — Telephone Encounter (Signed)
Called pt to report results and recommendations per MD Burr Medico, pt verbalized understanding of information and denies any questions/concerns at this time.  Pt confirmed lab/injection visits on 4/23.

## 2019-03-26 NOTE — Telephone Encounter (Signed)
-----   Message from Truitt Merle, MD sent at 03/25/2019 10:30 AM EDT ----- Please let pt know her anemia resolved now, B12 level normalized, continue B12 injections.   Truitt Merle  03/25/2019

## 2019-03-31 ENCOUNTER — Ambulatory Visit: Payer: Federal, State, Local not specified - PPO | Attending: Internal Medicine

## 2019-03-31 DIAGNOSIS — Z23 Encounter for immunization: Secondary | ICD-10-CM

## 2019-03-31 NOTE — Progress Notes (Signed)
   Covid-19 Vaccination Clinic  Name:  ADALIYAH CHOUDRY    MRN: [id number] DOB: 08-May-1955  03/31/2019  Ms. Vlahakis was observed post Covid-19 immunization for 15 minutes without incident. She was provided with Vaccine Information Sheet and instruction to access the V-Safe system.   Ms. Skiff was instructed to call 911 with any severe reactions post vaccine: Marland Kitchen Difficulty breathing  . Swelling of face and throat  . A fast heartbeat  . A bad rash all over body  . Dizziness and weakness   Immunizations Administered    Name Date Dose VIS Date Route   Pfizer COVID-19 Vaccine 03/31/2019 10:11 AM 0.3 mL 12/13/2018 Intramuscular   Manufacturer: [place]   Lot: [id number]   [place]: [id number]

## 2019-04-23 ENCOUNTER — Ambulatory Visit: Payer: Federal, State, Local not specified - PPO | Attending: Internal Medicine

## 2019-04-23 DIAGNOSIS — Z23 Encounter for immunization: Secondary | ICD-10-CM

## 2019-04-23 NOTE — Progress Notes (Signed)
   Covid-19 Vaccination Clinic  Name:  Mary Randall    MRN: [id number] DOB: Dec 26, 1955  04/23/2019  Mary Randall was observed post Covid-19 immunization for 15 minutes without incident. She was provided with Vaccine Information Sheet and instruction to access the V-Safe system.   Mary Randall was instructed to call 911 with any severe reactions post vaccine: Marland Kitchen Difficulty breathing  . Swelling of face and throat  . A fast heartbeat  . A bad rash all over body  . Dizziness and weakness   Immunizations Administered    Name Date Dose VIS Date Route   Pfizer COVID-19 Vaccine 04/23/2019  8:37 AM 0.3 mL 02/26/2018 Intramuscular   Manufacturer: [place]   Lot: [id number]   [place]: [id number]

## 2019-04-25 ENCOUNTER — Inpatient Hospital Stay: Payer: Federal, State, Local not specified - PPO | Attending: Hematology

## 2019-04-25 ENCOUNTER — Telehealth: Payer: Self-pay | Admitting: Hematology

## 2019-04-25 ENCOUNTER — Inpatient Hospital Stay: Payer: Federal, State, Local not specified - PPO

## 2019-04-25 DIAGNOSIS — C50212 Malignant neoplasm of upper-inner quadrant of left female breast: Secondary | ICD-10-CM | POA: Insufficient documentation

## 2019-04-25 DIAGNOSIS — Z170 Estrogen receptor positive status [ER+]: Secondary | ICD-10-CM | POA: Insufficient documentation

## 2019-04-25 DIAGNOSIS — E538 Deficiency of other specified B group vitamins: Secondary | ICD-10-CM | POA: Insufficient documentation

## 2019-04-25 NOTE — Telephone Encounter (Signed)
Called patient per 04/23 scheduled message, left a voicemail.

## 2019-05-02 ENCOUNTER — Other Ambulatory Visit: Payer: Self-pay

## 2019-05-02 ENCOUNTER — Inpatient Hospital Stay: Payer: Federal, State, Local not specified - PPO

## 2019-05-02 VITALS — BP 157/99 | HR 61 | Temp 98.5°F | Resp 18

## 2019-05-02 DIAGNOSIS — E538 Deficiency of other specified B group vitamins: Secondary | ICD-10-CM

## 2019-05-02 DIAGNOSIS — C50212 Malignant neoplasm of upper-inner quadrant of left female breast: Secondary | ICD-10-CM | POA: Diagnosis not present

## 2019-05-02 DIAGNOSIS — Z170 Estrogen receptor positive status [ER+]: Secondary | ICD-10-CM | POA: Diagnosis not present

## 2019-05-02 DIAGNOSIS — D518 Other vitamin B12 deficiency anemias: Secondary | ICD-10-CM

## 2019-05-02 LAB — CBC WITH DIFFERENTIAL (CANCER CENTER ONLY)
Abs Immature Granulocytes: 0 10*3/uL (ref 0.00–0.07)
Basophils Absolute: 0 10*3/uL (ref 0.0–0.1)
Basophils Relative: 0 %
Eosinophils Absolute: 0 10*3/uL (ref 0.0–0.5)
Eosinophils Relative: 1 %
HCT: 40.2 % (ref 36.0–46.0)
Hemoglobin: 12.8 g/dL (ref 12.0–15.0)
Immature Granulocytes: 0 %
Lymphocytes Relative: 38 %
Lymphs Abs: 1.4 10*3/uL (ref 0.7–4.0)
MCH: 31.9 pg (ref 26.0–34.0)
MCHC: 31.8 g/dL (ref 30.0–36.0)
MCV: 100.2 fL — ABNORMAL HIGH (ref 80.0–100.0)
Monocytes Absolute: 0.4 10*3/uL (ref 0.1–1.0)
Monocytes Relative: 9 %
Neutro Abs: 1.9 10*3/uL (ref 1.7–7.7)
Neutrophils Relative %: 52 %
Platelet Count: 213 10*3/uL (ref 150–400)
RBC: 4.01 MIL/uL (ref 3.87–5.11)
RDW: 16.7 % — ABNORMAL HIGH (ref 11.5–15.5)
WBC Count: 3.8 10*3/uL — ABNORMAL LOW (ref 4.0–10.5)
nRBC: 0 % (ref 0.0–0.2)

## 2019-05-02 MED ORDER — CYANOCOBALAMIN 1000 MCG/ML IJ SOLN
1000.0000 ug | Freq: Once | INTRAMUSCULAR | Status: AC
  Administered 2019-05-02: 1000 ug via INTRAMUSCULAR

## 2019-05-02 NOTE — Patient Instructions (Signed)

## 2019-05-09 NOTE — Progress Notes (Signed)
[place]   [phone] [phone]   Clinic Follow up Note   Patient Care Team: Rankins, Bill Salinas, MD as PCP - General (Family Medicine) Sherren Mocha, MD as PCP - Cardiology (Cardiology) Jovita Kussmaul, MD as Consulting Physician (General Surgery)  Date of Service:  05/12/2019  CHIEF COMPLAINT: Follow up left breast cancer   SUMMARY OF ONCOLOGIC HISTORY: Oncology History Overview Note  Breast cancer of upper-inner quadrant of left female breast [facility])   Staging form: Breast, AJCC 7th Edition     Clinical: Stage IIIA (T3, N1, M0) - Unsigned     Pathologic stage from 03/15/2015: Stage IIA (yT1c, N1a, cM0) - Signed by Truitt Merle, MD on 04/02/2015     Breast cancer of upper-inner quadrant of left female breast ([place])  08/06/2014 Initial Diagnosis   Breast 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   08/06/2014 Receptors her2   ER 100% positive, PR 5% positive, HER2-, Ki-67 50%   08/06/2014 Mammogram   Diagnostic mammogram and ultrasound showed left breast mass with associated calcification measuring 5.5 cm, indeterminate a  left axillary lymph node was cortical thickening, circumscribed low density oval 65m mass at 12:00 within the left breast.   09/03/2014 - 01/26/2015 Neo-Adjuvant Chemotherapy   ddAC every 2 weeks X4, followed by weekly Taxol X12    09/23/2014 Procedure   Genetic testing: Negative. Genes evaluated: ATM, BARD1, BRCA1, BRCA2, BRIP1, CDH1, CHEK2, EPCAM, FANCC, MLH1, MSH2, MSH6, NBN, PALB2, PMS2, PTEN, RAD51C, RAD51D, TP53, & XRCC2.    03/15/2015 Surgery   left lumpectomy and axillary node dissection (Marlou Starks   03/15/2015 Pathology Results   left lumpectomy showed G2 invasive ductal carcinoma, 2cm, G2, and DCIS, G2. the lateral margin was positive for  invasive tumor, 1 out of 15 nodes was positive.     ypT1c,ypN1a: Stage IIA    04/21/2015 Surgery   Re-excision for positive margin. Surgical path was negative for malignancy.   05/17/2015 - 06/28/2015 Radiation Therapy   adjuvant breast radiation (Isidore Moos. Left breast treated to 50 Gy in 25 fractions.  Left SCLV and PAB treated to 45 Gy in 25 fractions.  Left breast boost treated to 10 Gy in 5 fractions    07/12/2015 -  Anti-estrogen oral therapy   Letrozole 2.5 mg once daily. Planned duration of therapy: 10 years.    08/19/2015 Imaging   DEXA scan: Normal. (T-score -1.0)   08/24/2015 Mammogram   Diagnostic mammogram: No evidence for malignancy.    09/18/2016 Mammogram   IMPRESSION: Stable lumpectomy changes on the left. No evidence of malignancy bilaterally.   09/20/2017 Mammogram   09/20/2017 Mammogram IMPRESSION: Stable left breast lumpectomy site. No mammographic evidence of malignancy in the bilateral breasts.   09/26/2018 Mammogram   FINDINGS: There are lumpectomy changes in the lower inner left breast. Surgical changes in the inferior left axilla. No suspicious mass, suspicious microcalcification, or architectural distortion is identified to suggest malignancy in either breast. IMPRESSION: No evidence of malignancy in either breast.      CURRENT THERAPY:  Letrozole 2.5 mg once daily, started on 07/12/2015  INTERVAL HISTORY:  Mary ERISMANis here for a follow up of left breast cancer. She was last seen by me in 10/2017. She was seen by NP Lacie 6 months ago in interim. She presents to  the clinic alone. She notes she is doing well. She denies any new changes or new pain. She has been tolerating Letrozole without hot flashes or joint pain. I reviewed her medication list with her. She notes she is active with walking at her job. She has received both her COVID19 vaccines and felt back and hip pain temporarily. She sees her PCP and cardiologist yearly and as needed. She notes she  has lost weight. She notes she reduced eating meet to fish and turkey. She is also very active. She notes since being on B12 injection she has more energy.    REVIEW OF SYSTEMS:   Constitutional: Denies fevers, chills or abnormal weight loss Eyes: Denies blurriness of vision Ears, nose, mouth, throat, and face: Denies mucositis or sore throat Respiratory: Denies cough, dyspnea or wheezes Cardiovascular: Denies palpitation, chest discomfort or lower extremity swelling Gastrointestinal:  Denies nausea, heartburn or change in bowel habits Skin: Denies abnormal skin rashes Lymphatics: Denies new lymphadenopathy or easy bruising Neurological:Denies numbness, tingling or new weaknesses Behavioral/Psych: Mood is stable, no new changes  All other systems were reviewed with the patient and are negative.  MEDICAL HISTORY:  Past Medical History:  Diagnosis Date  . Angina effort    none recent  . Arthritis    "right knee" (03/16/2015)  . Breast cancer, left (HCC) dx'd 2016  . 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 Family history of breast cancer   . Family history of breast cancer in female   . Family history of pancreatic cancer   . Hx of radiation therapy 05/17/15- 06/28/15   Left Breast  . Hypertension   . NSTEMI (non-ST elevated myocardial infarction) (HCC) 03/16/2014   non-obs CAD, spasm seen  . Personal history of chemotherapy   . Personal history of radiation therapy   . Pneumonia 2009    SURGICAL HISTORY: Past Surgical History:  Procedure Laterality Date  . BREAST BIOPSY Left 08/06/14  . BREAST LUMPECTOMY Left 2017   chemo and radiation  . BREAST LUMPECTOMY WITH NEEDLE LOCALIZATION AND AXILLARY LYMPH NODE DISSECTION Left 03/15/2015   Procedure: LEFT BREAST LUMPECTOMY WITH BRACKETED NEEDLE LOCALIZATION AND AXILLARY LYMPH NODE DISSECTION;  Surgeon: Paul Toth III, MD;  Location: [facility] OR;  Service: General;  Laterality: Left;  . BUNIONECTOMY Bilateral   .  CARDIAC CATHETERIZATION  2010; 03/16/2014  . ECTOPIC PREGNANCY SURGERY  1991   Tubal pregnancy  . LEFT HEART CATHETERIZATION WITH CORONARY ANGIOGRAM N/A 03/16/2014   Procedure: LEFT HEART CATHETERIZATION WITH CORONARY ANGIOGRAM;  Surgeon: Michael Cooper, MD; Non-obs CAD, EF 55%  . PORT-A-CATH REMOVAL Right 03/15/2015   Procedure: REMOVAL PORT-A-CATH;  Surgeon: Paul Toth III, MD;  Location: [facility] OR;  Service: General;  Laterality: Right;  . PORTACATH PLACEMENT N/A 08/28/2014   Procedure: INSERTION PORT-A-CATH;  Surgeon: Paul Toth III, MD;  Location: [facility] OR;  Service: General;  Laterality: N/A;  . RE-EXCISION OF BREAST CANCER,SUPERIOR MARGINS Left 04/21/2015   Procedure: RE-EXCISION OF BREAST CANCER, INFERIOR MARGIN;  Surgeon: Paul Toth III, MD;  Location: [facility] ORS;  Service: General;  Laterality: Left;  . TUBAL LIGATION  1981    I have reviewed the social history and family history with the patient and they are unchanged from previous note.  ALLERGIES:  is allergic to carvedilol; codeine; percocet [oxycodone-acetaminophen]; adhesive [tape]; and compazine [prochlorperazine].  MEDICATIONS:  Current Outpatient Medications  Medication Sig Dispense Refill  . amLODipine (NORVASC) 5 MG tablet Take 1 tablet (5 mg total)   by mouth daily. 90 tablet 3  . atorvastatin (LIPITOR) 40 MG tablet TAKE 1 TABLET BY MOUTH EVERY DAY AT 6 PM 90 tablet 3  . cephALEXin (KEFLEX) 500 MG capsule Take 1 capsule (500 mg total) by mouth 4 (four) times daily. 28 capsule 0  . hydrochlorothiazide (HYDRODIURIL) 25 MG tablet TAKE 1 TABLET BY MOUTH ONCE DAILY 90 tablet 3  . irbesartan (AVAPRO) 150 MG tablet Take 1 tablet (150 mg total) by mouth daily. 90 tablet 3  . isosorbide mononitrate (IMDUR) 30 MG 24 hr tablet TAKE 1 TABLET BY MOUTH ONCE DAILY 90 tablet 3  . letrozole (FEMARA) 2.5 MG tablet Take 1 tablet (2.5 mg total) by mouth daily. 90 tablet 3  . nitroGLYCERIN (NITROSTAT) 0.4 MG SL tablet Place 1 tablet (0.4 mg total) under the  tongue every 5 (five) minutes as needed for chest pain. 25 tablet 12  . potassium chloride (KLOR-CON) 10 MEQ tablet TAKE 1 TABLET BY MOUTH EVERY EVENING 90 tablet 3   No current facility-administered medications for this visit.    PHYSICAL EXAMINATION: ECOG PERFORMANCE STATUS: 0 - Asymptomatic  Vitals:   05/12/19 0925  BP: (!) 130/92  Pulse: (!) 58  Resp: 18  Temp: 98 F (36.7 C)  SpO2: 99%   Filed Weights   05/12/19 0925  Weight: 162 lb (73.5 kg)    GENERAL:alert, no distress and comfortable SKIN: skin color, texture, turgor are normal, no rashes or significant lesions EYES: normal, Conjunctiva are pink and non-injected, sclera clear  NECK: supple, thyroid normal size, non-tender, without nodularity LYMPH:  no palpable lymphadenopathy in the cervical, axillary  LUNGS: clear to auscultation and percussion with normal breathing effort HEART: regular rate & rhythm and no murmurs and no lower extremity edema ABDOMEN:abdomen soft, non-tender and normal bowel sounds Musculoskeletal:no cyanosis of digits and no clubbing  NEURO: alert & oriented x 3 with fluent speech, no focal motor/sensory deficits BREAST: S/p left lumpectomy: Surgical incision healed well with mild scar tissue (+) Mild skin hyperpigmentation. No palpable mass, nodules or adenopathy bilaterally. Breast exam benign.   LABORATORY DATA:  I have reviewed the data as listed CBC Latest Ref Rng & Units 05/12/2019 05/02/2019 03/20/2019  WBC 4.0 - 10.5 K/uL 3.8(L) 3.8(L) 3.6(L)  Hemoglobin 12.0 - 15.0 g/dL 12.1 12.8 12.2  Hematocrit 36.0 - 46.0 % 38.1 40.2 35.9(L)  Platelets 150 - 400 K/uL 198 213 177     CMP Latest Ref Rng & Units 11/07/2018 11/03/2018 10/17/2017  Glucose 70 - 99 mg/dL 117(H) 100(H) 90  BUN 8 - 23 mg/dL 13 14 13  Creatinine 0.44 - 1.00 mg/dL 0.99 1.03(H) 1.03(H)  Sodium 135 - 145 mmol/L 143 136 142  Potassium 3.5 - 5.1 mmol/L 3.8 3.5 4.0  Chloride 98 - 111 mmol/L 105 103 105  CO2 22 - 32 mmol/L 27  26 27  Calcium 8.9 - 10.3 mg/dL 10.4(H) 10.2 10.8(H)  Total Protein 6.5 - 8.1 g/dL 6.9 - 7.4  Total Bilirubin 0.3 - 1.2 mg/dL 0.7 - 1.2  Alkaline Phos 38 - 126 U/L 76 - 90  AST 15 - 41 U/L 15 - 15  ALT 0 - 44 U/L 12 - 10      RADIOGRAPHIC STUDIES: I have personally reviewed the radiological images as listed and agreed with the findings in the report. No results found.   ASSESSMENT & PLAN:  Mary Randall is a 64 y.o. female with    1. Left breast invasive ductal carcinoma, cT3N1M0, stage   IIIA, ER+/PR+/HER2-, ypT1cN1aM0 after neoadjuvant chemotherapy -She was diagnosed in 08/2014. She is s/p neoadjuvant chemo ddAC-T, left breast lumpectomy and axillary re-excision surgeries and adjuvant Radiation.  -She has been on antiestrogen therapy with Letrozole since 07/2015, tolerating well so far. We'll continue for 7-10 years.  -She is clinically doing well. Lab reviewed, her CBC WNL except WBC 3.8. Will obtain CMP today. Her physical exam and her 09/2018 mammogram were unremarkable. There is no clinical concern for recurrence. -Continue Surveillance. Next screening mammogram in 09/2019. I encouraged her to do self breast exams once a month -Continue Letrozole  -she is almost 5 years out her initial diagnosis. F/u in 12 months. I encouraged her to watch for concerning symptoms of recurrence and to contact clinic with any concerns.    2. Osteopenia  -Her 08/1015 DEXA was normal with lowest T score -1.0. She became osteopenic with lowest t-score on -1.3 at forearm on 10/2017. -She has hypercalcemia, not on calcium. I recommend she take Vit D and to remain active.  -Her next DEXA is due 09/2019   3. Cancer Testing was negative for pathogenetic mutations.   4. History of non-STEMI in 03/2014 -She had cardiac catheterization, which showed mild diffuse coronary artery disease, but no need intervention.  -She will follow-up with her cardiologist Dr. Burt Knack  5. Peripheral neuropathy, G1 -Secondary  to chemotherapy, overall much improved. -she is off Neurontin now  -overall mild and stable. Not mentioned today, likely much improved or resolved.   6. Hypercalcemia -Her calcium has been around 11 and remained elevated.  -Her vitamin D level is low, PTH was normal. -I encouraged her to follow-up with her primary care physician Dr. Radene Ou.   7. 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   8. Anemia secondary to B12 deficiency -She received monthly B12 injections in our clinic since 02/2019. She has increased energy on B12 injection. -Her anemia has currently resolved on B12 injections, B12 normalized on second dose. Will continue to monitor.  -Will check her intrinsic factor antibody level today and may switch her to oral B12 in 6 months if test negative.    Plan -lab including CMP, B12 and intrinsic factor today  -B12 injection today, continue monthly X12 -Lab in 6 months  -Continue Letrozole  -Mammogram and DEXA in 09/2019  -Lab and F/u in 12 months    No problem-specific Assessment & Plan notes found for this encounter.   Orders Placed This Encounter  Procedures  . MM Digital Screening    Standing Status:   Future    Standing Expiration Date:   05/11/2020    Order Specific Question:   Reason for Exam (SYMPTOM  OR DIAGNOSIS REQUIRED)    Answer:   screening    Order Specific Question:   Preferred imaging location?    Answer:   [facility]  . DG Bone Density    Standing Status:   Future    Standing Expiration Date:   05/11/2020    Order Specific Question:   Reason for Exam (SYMPTOM  OR DIAGNOSIS REQUIRED)    Answer:   screening    Order Specific Question:   Preferred imaging location?    Answer:   [facility]  . CMP ([place] only)    Standing Status:   Standing    Number of Occurrences:   50    Standing Expiration  Date:   05/11/2024  . Vitamin  B12    Standing Status:   Standing    Number of Occurrences:   50    Standing Expiration Date:   05/11/2024  . Intrinsic factor antibodies    Standing Status:   Future    Number of Occurrences:   1    Standing Expiration Date:   05/11/2020   All questions were answered. The patient knows to call the clinic with any problems, questions or concerns. No barriers to learning was detected. The total time spent in the appointment was 30 minutes.     Yan Feng, MD 05/12/2019   I, Amoya Bennett, am acting as scribe for Yan Feng, MD.   I have reviewed the above documentation for accuracy and completeness, and I agree with the above.       

## 2019-05-12 ENCOUNTER — Inpatient Hospital Stay: Payer: Federal, State, Local not specified - PPO | Attending: Hematology

## 2019-05-12 ENCOUNTER — Encounter: Payer: Self-pay | Admitting: Hematology

## 2019-05-12 ENCOUNTER — Other Ambulatory Visit: Payer: Self-pay

## 2019-05-12 ENCOUNTER — Inpatient Hospital Stay: Payer: Federal, State, Local not specified - PPO

## 2019-05-12 ENCOUNTER — Inpatient Hospital Stay: Payer: Federal, State, Local not specified - PPO | Admitting: Hematology

## 2019-05-12 ENCOUNTER — Telehealth: Payer: Self-pay | Admitting: Hematology

## 2019-05-12 VITALS — BP 130/92 | HR 58 | Temp 98.0°F | Resp 18 | Ht 66.0 in | Wt 162.0 lb

## 2019-05-12 DIAGNOSIS — Z170 Estrogen receptor positive status [ER+]: Secondary | ICD-10-CM | POA: Diagnosis not present

## 2019-05-12 DIAGNOSIS — E2839 Other primary ovarian failure: Secondary | ICD-10-CM

## 2019-05-12 DIAGNOSIS — Z1231 Encounter for screening mammogram for malignant neoplasm of breast: Secondary | ICD-10-CM

## 2019-05-12 DIAGNOSIS — E538 Deficiency of other specified B group vitamins: Secondary | ICD-10-CM

## 2019-05-12 DIAGNOSIS — D518 Other vitamin B12 deficiency anemias: Secondary | ICD-10-CM

## 2019-05-12 DIAGNOSIS — C50212 Malignant neoplasm of upper-inner quadrant of left female breast: Secondary | ICD-10-CM | POA: Diagnosis not present

## 2019-05-12 LAB — CBC WITH DIFFERENTIAL (CANCER CENTER ONLY)
Abs Immature Granulocytes: 0.01 10*3/uL (ref 0.00–0.07)
Basophils Absolute: 0 10*3/uL (ref 0.0–0.1)
Basophils Relative: 1 %
Eosinophils Absolute: 0.1 10*3/uL (ref 0.0–0.5)
Eosinophils Relative: 2 %
HCT: 38.1 % (ref 36.0–46.0)
Hemoglobin: 12.1 g/dL (ref 12.0–15.0)
Immature Granulocytes: 0 %
Lymphocytes Relative: 40 %
Lymphs Abs: 1.5 10*3/uL (ref 0.7–4.0)
MCH: 31.2 pg (ref 26.0–34.0)
MCHC: 31.8 g/dL (ref 30.0–36.0)
MCV: 98.2 fL (ref 80.0–100.0)
Monocytes Absolute: 0.4 10*3/uL (ref 0.1–1.0)
Monocytes Relative: 11 %
Neutro Abs: 1.8 10*3/uL (ref 1.7–7.7)
Neutrophils Relative %: 46 %
Platelet Count: 198 10*3/uL (ref 150–400)
RBC: 3.88 MIL/uL (ref 3.87–5.11)
RDW: 15.8 % — ABNORMAL HIGH (ref 11.5–15.5)
WBC Count: 3.8 10*3/uL — ABNORMAL LOW (ref 4.0–10.5)
nRBC: 0 % (ref 0.0–0.2)

## 2019-05-12 LAB — CMP (CANCER CENTER ONLY)
ALT: 13 U/L (ref 0–44)
AST: 16 U/L (ref 15–41)
Albumin: 3.7 g/dL (ref 3.5–5.0)
Alkaline Phosphatase: 73 U/L (ref 38–126)
Anion gap: 11 (ref 5–15)
BUN: 12 mg/dL (ref 8–23)
CO2: 28 mmol/L (ref 22–32)
Calcium: 10.2 mg/dL (ref 8.9–10.3)
Chloride: 108 mmol/L (ref 98–111)
Creatinine: 0.93 mg/dL (ref 0.44–1.00)
GFR, Est AFR Am: >60 mL/min (ref >60)
GFR, Estimated: >60 mL/min (ref >60)
Glucose, Bld: 93 mg/dL (ref 70–99)
Potassium: 3.7 mmol/L (ref 3.5–5.1)
Sodium: 147 mmol/L — ABNORMAL HIGH (ref 135–145)
Total Bilirubin: 0.6 mg/dL (ref 0.3–1.2)
Total Protein: 6.8 g/dL (ref 6.5–8.1)

## 2019-05-12 LAB — VITAMIN B12: Vitamin B-12: 366 pg/mL (ref 180–914)

## 2019-05-12 MED ORDER — CYANOCOBALAMIN 1000 MCG/ML IJ SOLN
INTRAMUSCULAR | Status: AC
  Filled 2019-05-12: qty 1

## 2019-05-12 MED ORDER — CYANOCOBALAMIN 1000 MCG/ML IJ SOLN
1000.0000 ug | Freq: Once | INTRAMUSCULAR | Status: AC
  Administered 2019-05-12: 1000 ug via INTRAMUSCULAR

## 2019-05-12 NOTE — Patient Instructions (Signed)
Cyanocobalamin, Pyridoxine, and Folate What is this medicine? A multivitamin containing folic acid, vitamin B6, and vitamin B12. This medicine may be used for other purposes; ask your health care provider or pharmacist if you have questions. COMMON BRAND NAME(S): AllanFol RX, AllanTex, Av-Vite FB, B Complex with Folic Acid, ComBgen, FaBB, Folamin, Folastin, Folbalin, Folbee, Folbic, Folcaps, Folgard, Folgard RX, Folgard RX 2.2, Folplex, Folplex 2.2, Foltabs 800, Foltx, Homocysteine Formula, Niva-Fol, NuFol, TL Gard RX, Virt-Gard, Virt-Vite, Virt-Vite Forte, Vita-Respa What should I tell my health care provider before I take this medicine? They need to know if you have any of these conditions:  bleeding or clotting disorder  history of anemia of any type  other chronic health condition  an unusual or allergic reaction to vitamins, other medicines, foods, dyes, or preservatives  pregnant or trying to get pregnant  breast-feeding How should I use this medicine? Take by mouth with a glass of water. May take with food. Follow the directions on the prescription label. It is usually given once a day. Do not take your medicine more often than directed. Contact your pediatrician regarding the use of this medicine in children. Special care may be needed. Overdosage: If you think you have taken too much of this medicine contact a poison control center or emergency room at once. NOTE: This medicine is only for you. Do not share this medicine with others. What if I miss a dose? If you miss a dose, take it as soon as you can. If it is almost time for your next dose, take only that dose. Do not take double or extra doses. What may interact with this medicine?  levodopa This list may not describe all possible interactions. Give your health care provider a list of all the medicines, herbs, non-prescription drugs, or dietary supplements you use. Also tell them if you smoke, drink alcohol, or use illegal  drugs. Some items may interact with your medicine. What should I watch for while using this medicine? See your health care professional for regular checks on your progress. Remember that vitamin supplements do not replace the need for good nutrition from a balanced diet. What side effects may I notice from receiving this medicine? Side effects that you should report to your doctor or health care professional as soon as possible:  allergic reaction such as skin rash or difficulty breathing  vomiting Side effects that usually do not require medical attention (report to your doctor or health care professional if they continue or are bothersome):  nausea  stomach upset This list may not describe all possible side effects. Call your doctor for medical advice about side effects. You may report side effects to FDA at 1-800-FDA-1088. Where should I keep my medicine? Keep out of the reach of children. Most vitamins should be stored at controlled room temperature. Check your specific product directions. Protect from heat and moisture. Throw away any unused medicine after the expiration date. NOTE: This sheet is a summary. It may not cover all possible information. If you have questions about this medicine, talk to your doctor, pharmacist, or health care provider.  2020 Elsevier/Gold Standard (2007-02-09 00:59:55)  

## 2019-05-12 NOTE — Telephone Encounter (Signed)
Scheduled appt per 5/10 los - printed patient AVS

## 2019-05-13 LAB — INTRINSIC FACTOR ANTIBODIES: Intrinsic Factor: 271.7 [AU]/ml — ABNORMAL HIGH (ref 0.0–1.1)

## 2019-05-20 ENCOUNTER — Telehealth: Payer: Self-pay

## 2019-05-20 ENCOUNTER — Telehealth: Payer: Self-pay | Admitting: Hematology

## 2019-05-20 NOTE — Telephone Encounter (Signed)
I spoke with Mary Randall and reviewed Dr. Lewayne Bunting comments.  She is agreeable to this plan. Scheduling message sent to add injection appts on 5/21, 5/2/, and 6/4 and add ov on 11/18.

## 2019-05-20 NOTE — Telephone Encounter (Signed)
Scheduled appt per 5/18 sch message- pt aware of appt date and time   

## 2019-05-20 NOTE — Telephone Encounter (Signed)
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 I recommend add B12 injection weekly X4 from this Friday then every month after as it's schedule, also add f/u in 6 months (same day with her lab and injection in Nov), thanks   Truitt Merle  05/13/2019

## 2019-05-23 ENCOUNTER — Other Ambulatory Visit: Payer: Self-pay

## 2019-05-23 ENCOUNTER — Inpatient Hospital Stay: Payer: Federal, State, Local not specified - PPO

## 2019-05-23 VITALS — BP 132/88 | HR 58 | Temp 98.2°F | Resp 18

## 2019-05-23 DIAGNOSIS — E538 Deficiency of other specified B group vitamins: Secondary | ICD-10-CM

## 2019-05-23 DIAGNOSIS — C50212 Malignant neoplasm of upper-inner quadrant of left female breast: Secondary | ICD-10-CM | POA: Diagnosis not present

## 2019-05-23 DIAGNOSIS — Z170 Estrogen receptor positive status [ER+]: Secondary | ICD-10-CM | POA: Diagnosis not present

## 2019-05-23 MED ORDER — CYANOCOBALAMIN 1000 MCG/ML IJ SOLN
1000.0000 ug | Freq: Once | INTRAMUSCULAR | Status: AC
  Administered 2019-05-23: 1000 ug via INTRAMUSCULAR

## 2019-05-23 MED ORDER — CYANOCOBALAMIN 1000 MCG/ML IJ SOLN
INTRAMUSCULAR | Status: AC
  Filled 2019-05-23: qty 1

## 2019-05-23 NOTE — Patient Instructions (Signed)
Cyanocobalamin, Pyridoxine, and Folate What is this medicine? A multivitamin containing folic acid, vitamin B6, and vitamin B12. This medicine may be used for other purposes; ask your health care provider or pharmacist if you have questions. COMMON BRAND NAME(S): AllanFol RX, AllanTex, Av-Vite FB, B Complex with Folic Acid, ComBgen, FaBB, Folamin, Folastin, Folbalin, Folbee, Folbic, Folcaps, Folgard, Folgard RX, Folgard RX 2.2, Folplex, Folplex 2.2, Foltabs 800, Foltx, Homocysteine Formula, Niva-Fol, NuFol, TL Gard RX, Virt-Gard, Virt-Vite, Virt-Vite Forte, Vita-Respa What should I tell my health care provider before I take this medicine? They need to know if you have any of these conditions:  bleeding or clotting disorder  history of anemia of any type  other chronic health condition  an unusual or allergic reaction to vitamins, other medicines, foods, dyes, or preservatives  pregnant or trying to get pregnant  breast-feeding How should I use this medicine? Take by mouth with a glass of water. May take with food. Follow the directions on the prescription label. It is usually given once a day. Do not take your medicine more often than directed. Contact your pediatrician regarding the use of this medicine in children. Special care may be needed. Overdosage: If you think you have taken too much of this medicine contact a poison control center or emergency room at once. NOTE: This medicine is only for you. Do not share this medicine with others. What if I miss a dose? If you miss a dose, take it as soon as you can. If it is almost time for your next dose, take only that dose. Do not take double or extra doses. What may interact with this medicine?  levodopa This list may not describe all possible interactions. Give your health care provider a list of all the medicines, herbs, non-prescription drugs, or dietary supplements you use. Also tell them if you smoke, drink alcohol, or use illegal  drugs. Some items may interact with your medicine. What should I watch for while using this medicine? See your health care professional for regular checks on your progress. Remember that vitamin supplements do not replace the need for good nutrition from a balanced diet. What side effects may I notice from receiving this medicine? Side effects that you should report to your doctor or health care professional as soon as possible:  allergic reaction such as skin rash or difficulty breathing  vomiting Side effects that usually do not require medical attention (report to your doctor or health care professional if they continue or are bothersome):  nausea  stomach upset This list may not describe all possible side effects. Call your doctor for medical advice about side effects. You may report side effects to FDA at 1-800-FDA-1088. Where should I keep my medicine? Keep out of the reach of children. Most vitamins should be stored at controlled room temperature. Check your specific product directions. Protect from heat and moisture. Throw away any unused medicine after the expiration date. NOTE: This sheet is a summary. It may not cover all possible information. If you have questions about this medicine, talk to your doctor, pharmacist, or health care provider.  2020 Elsevier/Gold Standard (2007-02-09 00:59:55)  

## 2019-05-30 ENCOUNTER — Inpatient Hospital Stay: Payer: Federal, State, Local not specified - PPO

## 2019-05-30 ENCOUNTER — Other Ambulatory Visit: Payer: Self-pay

## 2019-05-30 VITALS — BP 128/72 | HR 62 | Temp 98.2°F | Resp 18

## 2019-05-30 DIAGNOSIS — Z170 Estrogen receptor positive status [ER+]: Secondary | ICD-10-CM | POA: Diagnosis not present

## 2019-05-30 DIAGNOSIS — E538 Deficiency of other specified B group vitamins: Secondary | ICD-10-CM

## 2019-05-30 DIAGNOSIS — C50212 Malignant neoplasm of upper-inner quadrant of left female breast: Secondary | ICD-10-CM | POA: Diagnosis not present

## 2019-05-30 MED ORDER — CYANOCOBALAMIN 1000 MCG/ML IJ SOLN
1000.0000 ug | Freq: Once | INTRAMUSCULAR | Status: AC
  Administered 2019-05-30: 1000 ug via INTRAMUSCULAR

## 2019-05-30 NOTE — Patient Instructions (Signed)
Cyanocobalamin, Pyridoxine, and Folate What is this medicine? A multivitamin containing folic acid, vitamin B6, and vitamin B12. This medicine may be used for other purposes; ask your health care provider or pharmacist if you have questions. COMMON BRAND NAME(S): AllanFol RX, AllanTex, Av-Vite FB, B Complex with Folic Acid, ComBgen, FaBB, Folamin, Folastin, Folbalin, Folbee, Folbic, Folcaps, Folgard, Folgard RX, Folgard RX 2.2, Folplex, Folplex 2.2, Foltabs 800, Foltx, Homocysteine Formula, Niva-Fol, NuFol, TL Gard RX, Virt-Gard, Virt-Vite, Virt-Vite Forte, Vita-Respa What should I tell my health care provider before I take this medicine? They need to know if you have any of these conditions:  bleeding or clotting disorder  history of anemia of any type  other chronic health condition  an unusual or allergic reaction to vitamins, other medicines, foods, dyes, or preservatives  pregnant or trying to get pregnant  breast-feeding How should I use this medicine? Take by mouth with a glass of water. May take with food. Follow the directions on the prescription label. It is usually given once a day. Do not take your medicine more often than directed. Contact your pediatrician regarding the use of this medicine in children. Special care may be needed. Overdosage: If you think you have taken too much of this medicine contact a poison control center or emergency room at once. NOTE: This medicine is only for you. Do not share this medicine with others. What if I miss a dose? If you miss a dose, take it as soon as you can. If it is almost time for your next dose, take only that dose. Do not take double or extra doses. What may interact with this medicine?  levodopa This list may not describe all possible interactions. Give your health care provider a list of all the medicines, herbs, non-prescription drugs, or dietary supplements you use. Also tell them if you smoke, drink alcohol, or use illegal  drugs. Some items may interact with your medicine. What should I watch for while using this medicine? See your health care professional for regular checks on your progress. Remember that vitamin supplements do not replace the need for good nutrition from a balanced diet. What side effects may I notice from receiving this medicine? Side effects that you should report to your doctor or health care professional as soon as possible:  allergic reaction such as skin rash or difficulty breathing  vomiting Side effects that usually do not require medical attention (report to your doctor or health care professional if they continue or are bothersome):  nausea  stomach upset This list may not describe all possible side effects. Call your doctor for medical advice about side effects. You may report side effects to FDA at 1-800-FDA-1088. Where should I keep my medicine? Keep out of the reach of children. Most vitamins should be stored at controlled room temperature. Check your specific product directions. Protect from heat and moisture. Throw away any unused medicine after the expiration date. NOTE: This sheet is a summary. It may not cover all possible information. If you have questions about this medicine, talk to your doctor, pharmacist, or health care provider.  2020 Elsevier/Gold Standard (2007-02-09 00:59:55)  

## 2019-06-06 ENCOUNTER — Inpatient Hospital Stay: Payer: Federal, State, Local not specified - PPO | Attending: Hematology

## 2019-06-06 ENCOUNTER — Other Ambulatory Visit: Payer: Self-pay

## 2019-06-06 VITALS — BP 124/70

## 2019-06-06 DIAGNOSIS — Z170 Estrogen receptor positive status [ER+]: Secondary | ICD-10-CM | POA: Diagnosis not present

## 2019-06-06 DIAGNOSIS — C50212 Malignant neoplasm of upper-inner quadrant of left female breast: Secondary | ICD-10-CM | POA: Insufficient documentation

## 2019-06-06 DIAGNOSIS — E538 Deficiency of other specified B group vitamins: Secondary | ICD-10-CM | POA: Diagnosis not present

## 2019-06-06 MED ORDER — CYANOCOBALAMIN 1000 MCG/ML IJ SOLN
1000.0000 ug | Freq: Once | INTRAMUSCULAR | Status: AC
  Administered 2019-06-06: 1000 ug via INTRAMUSCULAR

## 2019-06-06 MED ORDER — CYANOCOBALAMIN 1000 MCG/ML IJ SOLN
INTRAMUSCULAR | Status: AC
  Filled 2019-06-06: qty 1

## 2019-06-06 NOTE — Patient Instructions (Signed)
Cyanocobalamin, Pyridoxine, and Folate What is this medicine? A multivitamin containing folic acid, vitamin B6, and vitamin B12. This medicine may be used for other purposes; ask your health care provider or pharmacist if you have questions. COMMON BRAND NAME(S): AllanFol RX, AllanTex, Av-Vite FB, B Complex with Folic Acid, ComBgen, FaBB, Folamin, Folastin, Folbalin, Folbee, Folbic, Folcaps, Folgard, Folgard RX, Folgard RX 2.2, Folplex, Folplex 2.2, Foltabs 800, Foltx, Homocysteine Formula, Niva-Fol, NuFol, TL Gard RX, Virt-Gard, Virt-Vite, Virt-Vite Forte, Vita-Respa What should I tell my health care provider before I take this medicine? They need to know if you have any of these conditions:  bleeding or clotting disorder  history of anemia of any type  other chronic health condition  an unusual or allergic reaction to vitamins, other medicines, foods, dyes, or preservatives  pregnant or trying to get pregnant  breast-feeding How should I use this medicine? Take by mouth with a glass of water. May take with food. Follow the directions on the prescription label. It is usually given once a day. Do not take your medicine more often than directed. Contact your pediatrician regarding the use of this medicine in children. Special care may be needed. Overdosage: If you think you have taken too much of this medicine contact a poison control center or emergency room at once. NOTE: This medicine is only for you. Do not share this medicine with others. What if I miss a dose? If you miss a dose, take it as soon as you can. If it is almost time for your next dose, take only that dose. Do not take double or extra doses. What may interact with this medicine?  levodopa This list may not describe all possible interactions. Give your health care provider a list of all the medicines, herbs, non-prescription drugs, or dietary supplements you use. Also tell them if you smoke, drink alcohol, or use illegal  drugs. Some items may interact with your medicine. What should I watch for while using this medicine? See your health care professional for regular checks on your progress. Remember that vitamin supplements do not replace the need for good nutrition from a balanced diet. What side effects may I notice from receiving this medicine? Side effects that you should report to your doctor or health care professional as soon as possible:  allergic reaction such as skin rash or difficulty breathing  vomiting Side effects that usually do not require medical attention (report to your doctor or health care professional if they continue or are bothersome):  nausea  stomach upset This list may not describe all possible side effects. Call your doctor for medical advice about side effects. You may report side effects to FDA at 1-800-FDA-1088. Where should I keep my medicine? Keep out of the reach of children. Most vitamins should be stored at controlled room temperature. Check your specific product directions. Protect from heat and moisture. Throw away any unused medicine after the expiration date. NOTE: This sheet is a summary. It may not cover all possible information. If you have questions about this medicine, talk to your doctor, pharmacist, or health care provider.  2020 Elsevier/Gold Standard (2007-02-09 00:59:55)  

## 2019-06-13 ENCOUNTER — Inpatient Hospital Stay: Payer: Federal, State, Local not specified - PPO

## 2019-06-13 ENCOUNTER — Other Ambulatory Visit: Payer: Self-pay

## 2019-06-13 VITALS — BP 128/72 | HR 62 | Temp 98.2°F | Resp 18

## 2019-06-13 DIAGNOSIS — Z170 Estrogen receptor positive status [ER+]: Secondary | ICD-10-CM | POA: Diagnosis not present

## 2019-06-13 DIAGNOSIS — E538 Deficiency of other specified B group vitamins: Secondary | ICD-10-CM

## 2019-06-13 DIAGNOSIS — C50212 Malignant neoplasm of upper-inner quadrant of left female breast: Secondary | ICD-10-CM | POA: Diagnosis not present

## 2019-06-13 MED ORDER — CYANOCOBALAMIN 1000 MCG/ML IJ SOLN
1000.0000 ug | Freq: Once | INTRAMUSCULAR | Status: AC
  Administered 2019-06-13: 1000 ug via INTRAMUSCULAR

## 2019-06-13 NOTE — Patient Instructions (Signed)
Cyanocobalamin, Pyridoxine, and Folate What is this medicine? A multivitamin containing folic acid, vitamin B6, and vitamin B12. This medicine may be used for other purposes; ask your health care provider or pharmacist if you have questions. COMMON BRAND NAME(S): AllanFol RX, AllanTex, Av-Vite FB, B Complex with Folic Acid, ComBgen, FaBB, Folamin, Folastin, Folbalin, Folbee, Folbic, Folcaps, Folgard, Folgard RX, Folgard RX 2.2, Folplex, Folplex 2.2, Foltabs 800, Foltx, Homocysteine Formula, Niva-Fol, NuFol, TL Gard RX, Virt-Gard, Virt-Vite, Virt-Vite Forte, Vita-Respa What should I tell my health care provider before I take this medicine? They need to know if you have any of these conditions:  bleeding or clotting disorder  history of anemia of any type  other chronic health condition  an unusual or allergic reaction to vitamins, other medicines, foods, dyes, or preservatives  pregnant or trying to get pregnant  breast-feeding How should I use this medicine? Take by mouth with a glass of water. May take with food. Follow the directions on the prescription label. It is usually given once a day. Do not take your medicine more often than directed. Contact your pediatrician regarding the use of this medicine in children. Special care may be needed. Overdosage: If you think you have taken too much of this medicine contact a poison control center or emergency room at once. NOTE: This medicine is only for you. Do not share this medicine with others. What if I miss a dose? If you miss a dose, take it as soon as you can. If it is almost time for your next dose, take only that dose. Do not take double or extra doses. What may interact with this medicine?  levodopa This list may not describe all possible interactions. Give your health care provider a list of all the medicines, herbs, non-prescription drugs, or dietary supplements you use. Also tell them if you smoke, drink alcohol, or use illegal  drugs. Some items may interact with your medicine. What should I watch for while using this medicine? See your health care professional for regular checks on your progress. Remember that vitamin supplements do not replace the need for good nutrition from a balanced diet. What side effects may I notice from receiving this medicine? Side effects that you should report to your doctor or health care professional as soon as possible:  allergic reaction such as skin rash or difficulty breathing  vomiting Side effects that usually do not require medical attention (report to your doctor or health care professional if they continue or are bothersome):  nausea  stomach upset This list may not describe all possible side effects. Call your doctor for medical advice about side effects. You may report side effects to FDA at 1-800-FDA-1088. Where should I keep my medicine? Keep out of the reach of children. Most vitamins should be stored at controlled room temperature. Check your specific product directions. Protect from heat and moisture. Throw away any unused medicine after the expiration date. NOTE: This sheet is a summary. It may not cover all possible information. If you have questions about this medicine, talk to your doctor, pharmacist, or health care provider.  2020 Elsevier/Gold Standard (2007-02-09 00:59:55)  

## 2019-07-16 ENCOUNTER — Telehealth: Payer: Self-pay

## 2019-07-16 NOTE — Telephone Encounter (Signed)
I received voicemail to return call to sister however when attempting to call patient back no answer and I was unable to leave message on voicemail alternate number was attempted with no answer

## 2019-07-16 NOTE — Telephone Encounter (Signed)
I called to follow up on previous attempts at returning received call from family patient states she did speak with social worker and does not need anything further

## 2019-07-17 ENCOUNTER — Other Ambulatory Visit: Payer: Self-pay

## 2019-07-17 ENCOUNTER — Inpatient Hospital Stay: Payer: Federal, State, Local not specified - PPO | Attending: Hematology

## 2019-07-17 VITALS — BP 148/98 | HR 58 | Temp 98.7°F | Resp 18

## 2019-07-17 DIAGNOSIS — C50212 Malignant neoplasm of upper-inner quadrant of left female breast: Secondary | ICD-10-CM | POA: Insufficient documentation

## 2019-07-17 DIAGNOSIS — E538 Deficiency of other specified B group vitamins: Secondary | ICD-10-CM | POA: Insufficient documentation

## 2019-07-17 DIAGNOSIS — Z170 Estrogen receptor positive status [ER+]: Secondary | ICD-10-CM | POA: Insufficient documentation

## 2019-07-17 MED ORDER — CYANOCOBALAMIN 1000 MCG/ML IJ SOLN
1000.0000 ug | Freq: Once | INTRAMUSCULAR | Status: AC
  Administered 2019-07-17: 1000 ug via INTRAMUSCULAR

## 2019-08-20 ENCOUNTER — Inpatient Hospital Stay: Payer: Federal, State, Local not specified - PPO | Attending: Hematology

## 2019-08-20 ENCOUNTER — Other Ambulatory Visit: Payer: Self-pay

## 2019-08-20 VITALS — BP 136/94 | HR 59 | Temp 98.4°F | Resp 18

## 2019-08-20 DIAGNOSIS — E538 Deficiency of other specified B group vitamins: Secondary | ICD-10-CM | POA: Diagnosis not present

## 2019-08-20 MED ORDER — CYANOCOBALAMIN 1000 MCG/ML IJ SOLN
INTRAMUSCULAR | Status: AC
  Filled 2019-08-20: qty 1

## 2019-08-20 MED ORDER — CYANOCOBALAMIN 1000 MCG/ML IJ SOLN
1000.0000 ug | Freq: Once | INTRAMUSCULAR | Status: AC
  Administered 2019-08-20: 1000 ug via INTRAMUSCULAR

## 2019-08-20 NOTE — Patient Instructions (Signed)
Cyanocobalamin, Pyridoxine, and Folate What is this medicine? A multivitamin containing folic acid, vitamin B6, and vitamin B12. This medicine may be used for other purposes; ask your health care provider or pharmacist if you have questions. COMMON BRAND NAME(S): AllanFol RX, AllanTex, Av-Vite FB, B Complex with Folic Acid, ComBgen, FaBB, Folamin, Folastin, Folbalin, Folbee, Folbic, Folcaps, Folgard, Folgard RX, Folgard RX 2.2, Folplex, Folplex 2.2, Foltabs 800, Foltx, Homocysteine Formula, Niva-Fol, NuFol, TL Gard RX, Virt-Gard, Virt-Vite, Virt-Vite Forte, Vita-Respa What should I tell my health care provider before I take this medicine? They need to know if you have any of these conditions:  bleeding or clotting disorder  history of anemia of any type  other chronic health condition  an unusual or allergic reaction to vitamins, other medicines, foods, dyes, or preservatives  pregnant or trying to get pregnant  breast-feeding How should I use this medicine? Take by mouth with a glass of water. May take with food. Follow the directions on the prescription label. It is usually given once a day. Do not take your medicine more often than directed. Contact your pediatrician regarding the use of this medicine in children. Special care may be needed. Overdosage: If you think you have taken too much of this medicine contact a poison control center or emergency room at once. NOTE: This medicine is only for you. Do not share this medicine with others. What if I miss a dose? If you miss a dose, take it as soon as you can. If it is almost time for your next dose, take only that dose. Do not take double or extra doses. What may interact with this medicine?  levodopa This list may not describe all possible interactions. Give your health care provider a list of all the medicines, herbs, non-prescription drugs, or dietary supplements you use. Also tell them if you smoke, drink alcohol, or use illegal  drugs. Some items may interact with your medicine. What should I watch for while using this medicine? See your health care professional for regular checks on your progress. Remember that vitamin supplements do not replace the need for good nutrition from a balanced diet. What side effects may I notice from receiving this medicine? Side effects that you should report to your doctor or health care professional as soon as possible:  allergic reaction such as skin rash or difficulty breathing  vomiting Side effects that usually do not require medical attention (report to your doctor or health care professional if they continue or are bothersome):  nausea  stomach upset This list may not describe all possible side effects. Call your doctor for medical advice about side effects. You may report side effects to FDA at 1-800-FDA-1088. Where should I keep my medicine? Keep out of the reach of children. Most vitamins should be stored at controlled room temperature. Check your specific product directions. Protect from heat and moisture. Throw away any unused medicine after the expiration date. NOTE: This sheet is a summary. It may not cover all possible information. If you have questions about this medicine, talk to your doctor, pharmacist, or health care provider.  2020 Elsevier/Gold Standard (2007-02-09 00:59:55)  

## 2019-09-15 ENCOUNTER — Ambulatory Visit: Payer: Federal, State, Local not specified - PPO | Admitting: Physician Assistant

## 2019-09-15 ENCOUNTER — Inpatient Hospital Stay: Payer: Federal, State, Local not specified - PPO | Attending: Hematology

## 2019-09-15 ENCOUNTER — Other Ambulatory Visit: Payer: Self-pay

## 2019-09-15 VITALS — BP 138/95 | HR 79 | Temp 98.4°F | Resp 18

## 2019-09-15 DIAGNOSIS — E538 Deficiency of other specified B group vitamins: Secondary | ICD-10-CM | POA: Diagnosis not present

## 2019-09-15 MED ORDER — CYANOCOBALAMIN 1000 MCG/ML IJ SOLN
INTRAMUSCULAR | Status: AC
  Filled 2019-09-15: qty 1

## 2019-09-15 MED ORDER — CYANOCOBALAMIN 1000 MCG/ML IJ SOLN
1000.0000 ug | Freq: Once | INTRAMUSCULAR | Status: AC
  Administered 2019-09-15: 1000 ug via INTRAMUSCULAR

## 2019-09-15 NOTE — Patient Instructions (Signed)
Cyanocobalamin, Pyridoxine, and Folate What is this medicine? A multivitamin containing folic acid, vitamin B6, and vitamin B12. This medicine may be used for other purposes; ask your health care provider or pharmacist if you have questions. COMMON BRAND NAME(S): AllanFol RX, AllanTex, Av-Vite FB, B Complex with Folic Acid, ComBgen, FaBB, Folamin, Folastin, Folbalin, Folbee, Folbic, Folcaps, Folgard, Folgard RX, Folgard RX 2.2, Folplex, Folplex 2.2, Foltabs 800, Foltx, Homocysteine Formula, Niva-Fol, NuFol, TL Gard RX, Virt-Gard, Virt-Vite, Virt-Vite Forte, Vita-Respa What should I tell my health care provider before I take this medicine? They need to know if you have any of these conditions:  bleeding or clotting disorder  history of anemia of any type  other chronic health condition  an unusual or allergic reaction to vitamins, other medicines, foods, dyes, or preservatives  pregnant or trying to get pregnant  breast-feeding How should I use this medicine? Take by mouth with a glass of water. May take with food. Follow the directions on the prescription label. It is usually given once a day. Do not take your medicine more often than directed. Contact your pediatrician regarding the use of this medicine in children. Special care may be needed. Overdosage: If you think you have taken too much of this medicine contact a poison control center or emergency room at once. NOTE: This medicine is only for you. Do not share this medicine with others. What if I miss a dose? If you miss a dose, take it as soon as you can. If it is almost time for your next dose, take only that dose. Do not take double or extra doses. What may interact with this medicine?  levodopa This list may not describe all possible interactions. Give your health care provider a list of all the medicines, herbs, non-prescription drugs, or dietary supplements you use. Also tell them if you smoke, drink alcohol, or use illegal  drugs. Some items may interact with your medicine. What should I watch for while using this medicine? See your health care professional for regular checks on your progress. Remember that vitamin supplements do not replace the need for good nutrition from a balanced diet. What side effects may I notice from receiving this medicine? Side effects that you should report to your doctor or health care professional as soon as possible:  allergic reaction such as skin rash or difficulty breathing  vomiting Side effects that usually do not require medical attention (report to your doctor or health care professional if they continue or are bothersome):  nausea  stomach upset This list may not describe all possible side effects. Call your doctor for medical advice about side effects. You may report side effects to FDA at 1-800-FDA-1088. Where should I keep my medicine? Keep out of the reach of children. Most vitamins should be stored at controlled room temperature. Check your specific product directions. Protect from heat and moisture. Throw away any unused medicine after the expiration date. NOTE: This sheet is a summary. It may not cover all possible information. If you have questions about this medicine, talk to your doctor, pharmacist, or health care provider.  2020 Elsevier/Gold Standard (2007-02-09 00:59:55)  

## 2019-10-01 ENCOUNTER — Other Ambulatory Visit: Payer: Self-pay | Admitting: Hematology

## 2019-10-01 DIAGNOSIS — Z853 Personal history of malignant neoplasm of breast: Secondary | ICD-10-CM

## 2019-10-06 ENCOUNTER — Ambulatory Visit
Admission: RE | Admit: 2019-10-06 | Discharge: 2019-10-06 | Disposition: A | Discharge status: Home / Self Care | Payer: Federal, State, Local not specified - PPO | Source: Ambulatory Visit | Attending: Hematology | Admitting: Hematology

## 2019-10-06 ENCOUNTER — Other Ambulatory Visit: Payer: Self-pay

## 2019-10-06 ENCOUNTER — Other Ambulatory Visit: Payer: Federal, State, Local not specified - PPO

## 2019-10-06 DIAGNOSIS — R928 Other abnormal and inconclusive findings on diagnostic imaging of breast: Secondary | ICD-10-CM | POA: Diagnosis not present

## 2019-10-06 DIAGNOSIS — Z853 Personal history of malignant neoplasm of breast: Secondary | ICD-10-CM

## 2019-10-07 ENCOUNTER — Other Ambulatory Visit: Payer: Self-pay | Admitting: Physician Assistant

## 2019-10-09 ENCOUNTER — Other Ambulatory Visit: Payer: Self-pay

## 2019-10-09 ENCOUNTER — Encounter: Payer: Self-pay | Admitting: Physician Assistant

## 2019-10-09 ENCOUNTER — Ambulatory Visit: Payer: Federal, State, Local not specified - PPO | Admitting: Physician Assistant

## 2019-10-09 VITALS — BP 110/80 | HR 78 | Ht 66.0 in | Wt 167.8 lb

## 2019-10-09 DIAGNOSIS — I10 Essential (primary) hypertension: Secondary | ICD-10-CM | POA: Diagnosis not present

## 2019-10-09 DIAGNOSIS — I201 Angina pectoris with documented spasm: Secondary | ICD-10-CM | POA: Diagnosis not present

## 2019-10-09 DIAGNOSIS — E785 Hyperlipidemia, unspecified: Secondary | ICD-10-CM | POA: Diagnosis not present

## 2019-10-09 NOTE — Progress Notes (Signed)
Cardiology Office Note    Date:  10/09/2019   ID:  Mary Randall, DOB 1955/05/25, MRN [id number]  PCP:  Aretta Nip, MD  Cardiologist: Dr. Burt Knack   Chief Complaint: 12  Months follow up  History of Present Illness:   Mary Randall is a 64 y.o. female with hx of coronary vasospasm/Prinzmental angina, HTN, HLD and breast cancer presents for follow up.   Presented in 2016 with chest pain and significant EKG changes. Cath showed diffuse non obstructive CAD. LV function was normal. Treated with Isosorbide and amlodipine.   Here today for follow up. No complains. The patient denies nausea, vomiting, fever, chest pain, palpitations, shortness of breath, orthopnea, PND, dizziness, syncope, cough, congestion, abdominal pain, hematochezia, melena, lower extremity edema. No regular exercise but active a work. She works at [employer].    Past Medical History:  Diagnosis Date  . Angina effort    none recent  . Arthritis    "right knee" (03/16/2015)  . Breast cancer, left ([place]) dx'd 2016  . Coronary vasospasm ([place]) 2010; 03/16/2014   Prinz-Metal's angina  . Family history of breast cancer   . Family history of breast cancer in female   . Family history of pancreatic cancer   . Hx of radiation therapy 05/17/15- 06/28/15   Left Breast  . Hypertension   . NSTEMI (non-ST elevated myocardial infarction) ([place]) 03/16/2014   non-obs CAD, spasm seen  . Personal history of chemotherapy   . Personal history of radiation therapy   . Pneumonia 2009    Past Surgical History:  Procedure Laterality Date  . BREAST BIOPSY Left 08/06/14  . BREAST LUMPECTOMY Left 2017   chemo and radiation  . BREAST LUMPECTOMY WITH NEEDLE LOCALIZATION AND AXILLARY LYMPH NODE DISSECTION Left 03/15/2015   Procedure: LEFT BREAST LUMPECTOMY WITH BRACKETED NEEDLE LOCALIZATION AND AXILLARY LYMPH NODE DISSECTION;  Surgeon: Autumn Messing III, MD;  Location: [facility];  Service: General;  Laterality: Left;  . BUNIONECTOMY Bilateral   .  CARDIAC CATHETERIZATION  2010; 03/16/2014  . ECTOPIC PREGNANCY SURGERY  1991   Tubal pregnancy  . LEFT HEART CATHETERIZATION WITH CORONARY ANGIOGRAM N/A 03/16/2014   Procedure: LEFT HEART CATHETERIZATION WITH CORONARY ANGIOGRAM;  Surgeon: Sherren Mocha, MD; Non-obs CAD, EF 55%  . PORT-A-CATH REMOVAL Right 03/15/2015   Procedure: REMOVAL PORT-A-CATH;  Surgeon: Autumn Messing III, MD;  Location: [facility];  Service: General;  Laterality: Right;  . PORTACATH PLACEMENT N/A 08/28/2014   Procedure: INSERTION PORT-A-CATH;  Surgeon: Autumn Messing III, MD;  Location: [facility];  Service: General;  Laterality: N/A;  . RE-EXCISION OF BREAST CANCER,SUPERIOR MARGINS Left 04/21/2015   Procedure: RE-EXCISION OF BREAST CANCER, INFERIOR MARGIN;  Surgeon: Autumn Messing III, MD;  Location: [facility];  Service: General;  Laterality: Left;  . TUBAL LIGATION  1981    Current Medications: Prior to Admission medications   Medication Sig Start Date End Date Taking? Authorizing Provider  amLODipine (NORVASC) 5 MG tablet Take 1 tablet (5 mg total) by mouth daily. Please keep upcoming appt in October for future refills. Thank you 10/08/19   Sherren Mocha, MD  atorvastatin (LIPITOR) 40 MG tablet TAKE 1 TABLET BY MOUTH EVERY DAY AT 6 PM 01/06/19   Sherren Mocha, MD  cephALEXin (KEFLEX) 500 MG capsule Take 1 capsule (500 mg total) by mouth 4 (four) times daily. 11/03/18   Varney Biles, MD  hydrochlorothiazide (HYDRODIURIL) 25 MG tablet TAKE 1 TABLET BY MOUTH ONCE DAILY 01/06/19   Sherren Mocha,  MD  irbesartan (AVAPRO) 150 MG tablet Take 1 tablet (150 mg total) by mouth daily. Please keep upcoming appt in October before anymore refills. Thank you 10/08/19   Sherren Mocha, MD  isosorbide mononitrate (IMDUR) 30 MG 24 hr tablet TAKE 1 TABLET BY MOUTH ONCE DAILY 10/14/18   Sherren Mocha, MD  letrozole [facility]) 2.5 MG tablet Take 1 tablet (2.5 mg total) by mouth daily. 11/07/18   Alla Feeling, NP  nitroGLYCERIN (NITROSTAT) 0.4 MG SL tablet Place 1  tablet (0.4 mg total) under the tongue every 5 (five) minutes as needed for chest pain. 08/07/16   Sherren Mocha, MD  potassium chloride (KLOR-CON) 10 MEQ tablet TAKE 1 TABLET BY MOUTH EVERY EVENING 01/06/19   Sherren Mocha, MD    Allergies:   Carvedilol, Codeine, Percocet [oxycodone-acetaminophen], Adhesive [tape], and Compazine [prochlorperazine]   Social History   Socioeconomic History  . Marital status: Legally Separated    Spouse name: Not on file  . Number of children: Not on file  . Years of education: Not on file  . Highest education level: Not on file  Occupational History  . Not on file  Tobacco Use  . Smoking status: Never Smoker  . Smokeless tobacco: Never Used  Vaping Use  . Vaping Use: Never used  Substance and Sexual Activity  . Alcohol use: No  . Drug use: No  . Sexual activity: Never  Other Topics Concern  . Not on file  Social History Narrative  . Not on file   Social Determinants of Health   Financial Resource Strain:   . Difficulty of Paying Living Expenses: Not on file  Food Insecurity:   . Worried About Charity fundraiser in the Last Year: Not on file  . Ran Out of Food in the Last Year: Not on file  Transportation Needs:   . Lack of Transportation (Medical): Not on file  . Lack of Transportation (Non-Medical): Not on file  Physical Activity:   . Days of Exercise per Week: Not on file  . Minutes of Exercise per Session: Not on file  Stress:   . Feeling of Stress : Not on file  Social Connections:   . Frequency of Communication with Friends and Family: Not on file  . Frequency of Social Gatherings with Friends and Family: Not on file  . Attends Religious Services: Not on file  . Active Member of Clubs or Organizations: Not on file  . Attends Archivist Meetings: Not on file  . Marital Status: Not on file     Family History:  The patient's family history includes Breast cancer in her cousin; Breast cancer (age of onset: 65) in her  brother; CVA in her father; Colon cancer (age of onset: 69) in her mother; Diabetes type II in her mother; Healthy in her sister and sister; Hypertension in her mother; Pancreatic cancer (age of onset: 43) in her brother.   ROS:   Please see the history of present illness.    ROS All other systems reviewed and are negative.   PHYSICAL EXAM:   VS:  BP 110/80   Pulse 78   Ht 5\' 6"  (1.676 m)   Wt 167 lb 12.8 oz (76.1 kg)   SpO2 98%   BMI 27.08 kg/m    GEN: Well nourished, well developed, in no acute distress  HEENT: normal  Neck: no JVD, carotid bruits, or masses Cardiac: RRR; no murmurs, rubs, or gallops,no edema  Respiratory:  clear to  auscultation bilaterally, normal work of breathing GI: soft, nontender, nondistended, + BS MS: no deformity or atrophy  Skin: warm and dry, no rash Neuro:  Alert and Oriented x 3, Strength and sensation are intact Psych: euthymic mood, full affect  Wt Readings from Last 3 Encounters:  10/09/19 167 lb 12.8 oz (76.1 kg)  05/12/19 162 lb (73.5 kg)  11/07/18 175 lb 3.2 oz (79.5 kg)      Studies/Labs Reviewed:   EKG:  EKG is ordered today.  The ekg ordered today demonstrates NSR at rate of 78 bpm  Recent Labs: 05/12/2019: ALT 13; BUN 12; Creatinine 0.93; Hemoglobin 12.1; Platelet Count 198; Potassium 3.7; Sodium 147   Lipid Panel    Component Value Date/Time   CHOL 144 03/17/2014 0402   TRIG 136 03/17/2014 0402   HDL 39 (L) 03/17/2014 0402   CHOLHDL 3.7 03/17/2014 0402   VLDL 27 03/17/2014 0402   LDLCALC 78 03/17/2014 0402    Additional studies/ records that were reviewed today include:   Echocardiogram: 08/2014 - Left ventricle: The cavity size was normal. Wall thickness was  normal. Systolic function was normal. The estimated ejection  fraction was in the range of 55% to 60%.  - Left atrium: The atrium was mildly dilated.   Cardiac Catheterization: 03/2014 Coronary angiography: Coronary dominance: right  Left mainstem: The  left main stem is widely patent. The vessel has minor irregularity and divides into the LAD and left circumflex. There is no stenosis.  Left anterior descending (LAD): The LAD has very minimal stenosis at the ostium. The diagonal branches are patent. The first diagonal has minor irregularity. The second diagonal has 50% ostial stenosis. The proximal LAD has no significant disease. The mid LAD after the second diagonal has diffuse 40-50% stenosis. The LAD wraps around the inferoapex.  Left circumflex (LCx): The left circumflex is large in caliber. The vessel has no significant obstruction through the proximal and mid segments. There is minor 20% stenosis in the mid vessel. The obtuse marginal branches are patent with mild nonobstructive disease noted.  Right coronary artery (RCA): This is a large, dominant vessel. The PDA and PLA branches are patent. There is no stenosis throughout the RCA distribution.  Left ventriculography: Left ventricular systolic function is normal, LVEF is estimated at 60-65%, there is no significant mitral regurgitation   Contrast: 85 cc Omnipaque  Estimated Blood Loss: minimal  Final Conclusions:   1. 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 Normal LV systolic function with normal LVEDP  Recommendations: Somewhat unclear as to the etiology of this patient's fairly profound symptoms and EKG changes. 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 The patient had previously been on long-acting nitrates and ran out of medication recently. It is also possible that she has some sort of GI pathology but difficult to explain her EKG changes. We'll start back on long-acting nitrate therapy. The patient should have serial cardiac enzymes and overnight observation in the hospital.    ASSESSMENT & PLAN:    1. Prizmetal angina - mild non obstructive CAD by cath in 2016. No anginal symptoms. Continue current  medical therapy.   2. HTN - BP stable and well controlled. No chage.   3. HLD - No results found for requested labs within last 8760 hours. Due for lab. Patient states that she will follow up with PCP. LFTS was normal 05/2019..  4. Hx of L Breast cancer s/p chemoradiation and lumpectomy  -  In remission    Medication Adjustments/Labs and Tests Ordered: Current medicines are reviewed at length with the patient today.  Concerns regarding medicines are outlined above.  Medication changes, Labs and Tests ordered today are listed in the Patient Instructions below. Patient Instructions  Medication Instructions:  Your physician recommends that you continue on your current medications as directed. Please refer to the Current Medication list given to you today.  *If you need a refill on your cardiac medications before your next appointment, please call your pharmacy*   Lab Work: None ordered  If you have labs (blood work) drawn today and your tests are completely normal, you will receive your results only by: Marland Kitchen MyChart Message (if you have MyChart) OR . A paper copy in the mail If you have any lab test that is abnormal or we need to change your treatment, we will call you to review the results.   Testing/Procedures: None ordered   Follow-Up: At [facility], you and your health needs are our priority.  As part of our continuing mission to provide you with exceptional heart care, we have created designated Provider Care Teams.  These Care Teams include your primary Cardiologist (physician) and Advanced Practice Providers (APPs -  Physician Assistants and Nurse Practitioners) who all work together to provide you with the care you need, when you need it.  We recommend signing up for the patient portal called "MyChart".  Sign up information is provided on this After Visit Summary.  MyChart is used to connect with patients for Virtual Visits (Telemedicine).  Patients are able to view lab/test  results, encounter notes, upcoming appointments, etc.  Non-urgent messages can be sent to your provider as well.   To learn more about what you can do with MyChart, go to [email or web address].    Your next appointment:   12 month(s)  The format for your next appointment:   In Person  Provider:   You may see Sherren Mocha, MD or one of the following Advanced Practice Providers on your designated Care Team:    Richardson Dopp, PA-C  Robbie Lis, PA-C    Other Instructions      Signed, Leanor Kail, [place]  10/09/2019 10:35 AM    [address], [place], [place]  [address] Phone: [phone]; Fax: [phone]

## 2019-10-09 NOTE — Patient Instructions (Signed)

## 2019-10-17 ENCOUNTER — Inpatient Hospital Stay: Payer: Federal, State, Local not specified - PPO

## 2019-11-17 NOTE — Progress Notes (Signed)
[facility]   [phone] Fax:([phone]) [phone]   Clinic Follow up Note   Patient Care Team: Rankins, Bill Salinas, MD as PCP - General (Family Medicine) Sherren Mocha, MD as PCP - Cardiology (Cardiology) Jovita Kussmaul, MD as Consulting Physician (General Surgery)  Date of Service:  11/20/2019  CHIEF COMPLAINT: Follow up left breast cancer  SUMMARY OF ONCOLOGIC HISTORY: Oncology History Overview Note  Breast cancer of upper-inner quadrant of left female breast [facility])   Staging form: Breast, AJCC 7th Edition     Clinical: Stage IIIA (T3, N1, M0) - Unsigned     Pathologic stage from 03/15/2015: Stage IIA (yT1c, N1a, cM0) - Signed by Truitt Merle, MD on 04/02/2015     Breast cancer of upper-inner quadrant of left female breast ([place])  08/06/2014 Initial Diagnosis   Breast 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   08/06/2014 Receptors her2   ER 100% positive, PR 5% positive, HER2-, Ki-67 50%   08/06/2014 Mammogram   Diagnostic mammogram and ultrasound showed left breast mass with associated calcification measuring 5.5 cm, indeterminate a  left axillary lymph node was cortical thickening, circumscribed low density oval 79mm mass at 12:00 within the left breast.   09/03/2014 - 01/26/2015 Neo-Adjuvant Chemotherapy   ddAC every 2 weeks X4, followed by weekly Taxol X12    09/23/2014 Procedure   Genetic testing: Negative. Genes evaluated: ATM, BARD1, BRCA1, BRCA2, BRIP1, CDH1, CHEK2, EPCAM, FANCC, MLH1, MSH2, MSH6, NBN, PALB2, PMS2, PTEN, RAD51C, RAD51D, TP53, & XRCC2.    03/15/2015 Surgery   left lumpectomy and axillary node dissection [facility])   03/15/2015 Pathology Results   left lumpectomy showed G2 invasive ductal carcinoma, 2cm, G2, and DCIS, G2. the lateral margin was positive for  invasive tumor, 1 out of 15 nodes was positive.     ypT1c,ypN1a: Stage IIA    04/21/2015 Surgery   Re-excision for positive margin. Surgical path was negative for malignancy.   05/17/2015 - 06/28/2015 Radiation Therapy   adjuvant breast radiation Isidore Moos). Left breast treated to 50 Gy in 25 fractions.  Left SCLV and PAB treated to 45 Gy in 25 fractions.  Left breast boost treated to 10 Gy in 5 fractions    07/12/2015 -  Anti-estrogen oral therapy   Letrozole 2.5 mg once daily. Planned duration of therapy: 10 years.    08/19/2015 Imaging   DEXA scan: Normal. (T-score -1.0)   08/24/2015 Mammogram   Diagnostic mammogram: No evidence for malignancy.    09/18/2016 Mammogram   IMPRESSION: Stable lumpectomy changes on the left. No evidence of malignancy bilaterally.   09/20/2017 Mammogram   09/20/2017 Mammogram IMPRESSION: Stable left breast lumpectomy site. No mammographic evidence of malignancy in the bilateral breasts.   09/26/2018 Mammogram   FINDINGS: There are lumpectomy changes in the lower inner left breast. Surgical changes in the inferior left axilla. No suspicious mass, suspicious microcalcification, or architectural distortion is identified to suggest malignancy in either breast. IMPRESSION: No evidence of malignancy in either breast.      CURRENT THERAPY:  Letrozole 2.5 mg once daily, started on 07/12/2015  INTERVAL HISTORY:  Mary Randall is here for a follow up of left breast cancer. She was last seen by me 6 months ago. She presents to the clinic alone. Dong well, no issue with letrozole, no significant  hot flashes or joint pain. She denies any new pain, no other new concerns.  Appetite and energy level has been very well.  Has been on B12 injection since 02/2019, she would like to have injection at home.  She tolerates well, no noticeable clinical change since she started the injection.  All other systems were reviewed with the patient and are negative.  MEDICAL  HISTORY:  Past Medical History:  Diagnosis Date  . Angina effort    none recent  . Arthritis    "right knee" (03/16/2015)  . Breast cancer, left ([place]) dx'd 2016  . 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 Family history of breast cancer   . Family history of breast cancer in female   . Family history of pancreatic cancer   . Hx of radiation therapy 05/17/15- 06/28/15   Left Breast  . Hypertension   . NSTEMI (non-ST elevated myocardial infarction) ([place]) 03/16/2014   non-obs CAD, spasm seen  . Personal history of chemotherapy   . Personal history of radiation therapy   . Pneumonia 2009    SURGICAL HISTORY: Past Surgical History:  Procedure Laterality Date  . BREAST BIOPSY Left 08/06/14  . BREAST LUMPECTOMY Left 2017   chemo and radiation  . BREAST LUMPECTOMY WITH NEEDLE LOCALIZATION AND AXILLARY LYMPH NODE DISSECTION Left 03/15/2015   Procedure: LEFT BREAST LUMPECTOMY WITH BRACKETED NEEDLE LOCALIZATION AND AXILLARY LYMPH NODE DISSECTION;  Surgeon: Autumn Messing III, MD;  Location: [place];  Service: General;  Laterality: Left;  . BUNIONECTOMY Bilateral   . CARDIAC CATHETERIZATION  2010; 03/16/2014  . ECTOPIC PREGNANCY SURGERY  1991   Tubal pregnancy  . LEFT HEART CATHETERIZATION WITH CORONARY ANGIOGRAM N/A 03/16/2014   Procedure: LEFT HEART CATHETERIZATION WITH CORONARY ANGIOGRAM;  Surgeon: Sherren Mocha, MD; Non-obs CAD, EF 55%  . PORT-A-CATH REMOVAL Right 03/15/2015   Procedure: REMOVAL PORT-A-CATH;  Surgeon: Autumn Messing III, MD;  Location: [place];  Service: General;  Laterality: Right;  . PORTACATH PLACEMENT N/A 08/28/2014   Procedure: INSERTION PORT-A-CATH;  Surgeon: Autumn Messing III, MD;  Location: [place];  Service: General;  Laterality: N/A;  . RE-EXCISION OF BREAST CANCER,SUPERIOR MARGINS Left 04/21/2015   Procedure: RE-EXCISION OF BREAST CANCER, INFERIOR MARGIN;  Surgeon: Autumn Messing III, MD;  Location: [facility];  Service: General;  Laterality: Left;  . TUBAL LIGATION   1981    I have reviewed the social history and family history with the patient and they are unchanged from previous note.  ALLERGIES:  is allergic to carvedilol, codeine, percocet [oxycodone-acetaminophen], adhesive [tape], and compazine [prochlorperazine].  MEDICATIONS:  Current Outpatient Medications  Medication Sig Dispense Refill  . amLODipine (NORVASC) 5 MG tablet Take 1 tablet (5 mg total) by mouth daily. Please keep upcoming appt in October for future refills. Thank you 90 tablet 0  . atorvastatin (LIPITOR) 40 MG tablet TAKE 1 TABLET BY MOUTH EVERY DAY AT 6 PM 90 tablet 3  . hydrochlorothiazide (HYDRODIURIL) 25 MG tablet TAKE 1 TABLET BY MOUTH ONCE DAILY 90 tablet 3  . irbesartan (AVAPRO) 150 MG tablet Take 1 tablet (150 mg total) by mouth daily. Please keep upcoming appt in October before anymore refills. Thank you 90 tablet 0  . isosorbide mononitrate (IMDUR) 30 MG 24 hr tablet TAKE 1 TABLET BY MOUTH ONCE DAILY 90 tablet 3  . letrozole (FEMARA) 2.5 MG tablet Take 1 tablet (2.5 mg total) by mouth daily. 90 tablet 3  . nitroGLYCERIN (NITROSTAT) 0.4 MG SL tablet  Place 1 tablet (0.4 mg total) under the tongue every 5 (five) minutes as needed for chest pain. 25 tablet 12  . potassium chloride (KLOR-CON) 10 MEQ tablet TAKE 1 TABLET BY MOUTH EVERY EVENING 90 tablet 3   No current facility-administered medications for this visit.    PHYSICAL EXAMINATION: ECOG PERFORMANCE STATUS: 0 - Asymptomatic  Vitals:   11/20/19 0928  BP: 129/87  Pulse: 75  Resp: 16  Temp: 97.8 F (36.6 C)  SpO2: 99%   Filed Weights   11/20/19 0928  Weight: 164 lb 14.4 oz (74.8 kg)    GENERAL:alert, no distress and comfortable SKIN: skin color, texture, turgor are normal, no rashes or significant lesions EYES: normal, Conjunctiva are pink and non-injected, sclera clear NECK: supple, thyroid normal size, non-tender, without nodularity LYMPH:  no palpable lymphadenopathy in the cervical, axillary  LUNGS:  clear to auscultation and percussion with normal breathing effort HEART: regular rate & rhythm and no murmurs and no lower extremity edema ABDOMEN:abdomen soft, non-tender and normal bowel sounds Musculoskeletal:no cyanosis of digits and no clubbing  NEURO: alert & oriented x 3 with fluent speech, no focal motor/sensory deficits Breasts: Breast inspection showed them to be symmetrical with no nipple discharge.  Surgical incision in left breast healed well with moderate scar tissue and skin pigmentation.  Palpation of the breasts and axilla revealed no obvious mass that I could appreciate.   LABORATORY DATA:  I have reviewed the data as listed CBC Latest Ref Rng & Units 11/20/2019 05/12/2019 05/02/2019  WBC 4.0 - 10.5 K/uL 4.2 3.8(L) 3.8(L)  Hemoglobin 12.0 - 15.0 g/dL 12.2 12.1 12.8  Hematocrit 36 - 46 % 38.1 38.1 40.2  Platelets 150 - 400 K/uL 199 198 213     CMP Latest Ref Rng & Units 05/12/2019 11/07/2018 11/03/2018  Glucose 70 - 99 mg/dL 93 117(H) 100(H)  BUN 8 - 23 mg/dL $Remove'12 13 14  'lNOjKeL$ Creatinine 0.44 - 1.00 mg/dL 0.93 0.99 1.03(H)  Sodium 135 - 145 mmol/L 147(H) 143 136  Potassium 3.5 - 5.1 mmol/L 3.7 3.8 3.5  Chloride 98 - 111 mmol/L 108 105 103  CO2 22 - 32 mmol/L $RemoveB'28 27 26  'kSvLTmpO$ Calcium 8.9 - 10.3 mg/dL 10.2 10.4(H) 10.2  Total Protein 6.5 - 8.1 g/dL 6.8 6.9 -  Total Bilirubin 0.3 - 1.2 mg/dL 0.6 0.7 -  Alkaline Phos 38 - 126 U/L 73 76 -  AST 15 - 41 U/L 16 15 -  ALT 0 - 44 U/L 13 12 -      RADIOGRAPHIC STUDIES: I have personally reviewed the radiological images as listed and agreed with the findings in the report. No results found.   ASSESSMENT & PLAN:  JANIESHA DIEHL is a 64 y.o. female with    1. Left breast invasive ductal carcinoma, cT3N1M0, stage IIIA, ER+/PR+/HER2-, ypT1cN1aM0 after neoadjuvant chemotherapy -She was diagnosed in 08/2014. She is s/p neoadjuvant chemo ddAC-T, left breast lumpectomy and axillary re-excision surgeries and adjuvant Radiation.  -She has been on  antiestrogen therapy with Letrozole since 07/2015, tolerating well so far. We'll continue for 7-10 years.  -She is clinically doing well, finds.  Lab reviewed, no concerns, exam was unremarkable.  No clinical concern for recurrence. -Continue breast cancer surveillance, last mammogram in October 2021 was normal -Follow-up in 6 months   2. Osteopenia  -Her 08/1015 DEXA was normal with lowest T score -1.0. She became osteopenic with lowest t-score on -1.3 at forearm on 10/2017. -She has hypercalcemia, not on calcium.  I recommend she take Vit D and to remain active.  -Plan for next DEXA on 01/15/20  3. Cancer Testing was negative for pathogenetic mutations.   4. History of non-STEMI in 03/2014 -She had cardiac catheterization, which showed mild diffuse coronary artery disease, but no need intervention.  -She will follow-up with her cardiologist Dr. Burt Knack   5. Hypercalcemia -Her calcium has been around 11 and remained elevated.  -Her vitamin D level is low, PTH was normal. -I encouraged her to follow-up with her primary care physician Dr. Radene Ou.   6. 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   7.  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   No orders of the defined types were placed in this encounter.  All questions were answered. The patient knows to call the clinic with any problems,  questions or concerns. No barriers to learning was detected. The total time spent in the appointment was 30 minutes.     Truitt Merle, MD 11/20/2019   I, Joslyn Devon, am acting as scribe for Truitt Merle, MD.   I have reviewed the above documentation for accuracy and completeness, and I agree with the above.

## 2019-11-20 ENCOUNTER — Other Ambulatory Visit: Payer: Self-pay | Admitting: Hematology

## 2019-11-20 ENCOUNTER — Encounter: Payer: Self-pay | Admitting: Hematology

## 2019-11-20 ENCOUNTER — Inpatient Hospital Stay: Payer: Federal, State, Local not specified - PPO | Attending: Hematology

## 2019-11-20 ENCOUNTER — Inpatient Hospital Stay (HOSPITAL_BASED_OUTPATIENT_CLINIC_OR_DEPARTMENT_OTHER): Payer: Federal, State, Local not specified - PPO | Admitting: Hematology

## 2019-11-20 ENCOUNTER — Inpatient Hospital Stay: Payer: Federal, State, Local not specified - PPO

## 2019-11-20 ENCOUNTER — Other Ambulatory Visit: Payer: Self-pay

## 2019-11-20 VITALS — BP 129/87 | HR 75 | Temp 97.8°F | Resp 16 | Ht 66.0 in | Wt 164.9 lb

## 2019-11-20 DIAGNOSIS — Z800 Family history of malignant neoplasm of digestive organs: Secondary | ICD-10-CM | POA: Insufficient documentation

## 2019-11-20 DIAGNOSIS — D531 Other megaloblastic anemias, not elsewhere classified: Secondary | ICD-10-CM | POA: Insufficient documentation

## 2019-11-20 DIAGNOSIS — Z885 Allergy status to narcotic agent status: Secondary | ICD-10-CM | POA: Diagnosis not present

## 2019-11-20 DIAGNOSIS — Z79899 Other long term (current) drug therapy: Secondary | ICD-10-CM | POA: Insufficient documentation

## 2019-11-20 DIAGNOSIS — E538 Deficiency of other specified B group vitamins: Secondary | ICD-10-CM | POA: Insufficient documentation

## 2019-11-20 DIAGNOSIS — Z170 Estrogen receptor positive status [ER+]: Secondary | ICD-10-CM

## 2019-11-20 DIAGNOSIS — I10 Essential (primary) hypertension: Secondary | ICD-10-CM | POA: Diagnosis not present

## 2019-11-20 DIAGNOSIS — I252 Old myocardial infarction: Secondary | ICD-10-CM | POA: Diagnosis not present

## 2019-11-20 DIAGNOSIS — I2510 Atherosclerotic heart disease of native coronary artery without angina pectoris: Secondary | ICD-10-CM | POA: Insufficient documentation

## 2019-11-20 DIAGNOSIS — C50212 Malignant neoplasm of upper-inner quadrant of left female breast: Secondary | ICD-10-CM | POA: Insufficient documentation

## 2019-11-20 DIAGNOSIS — Z888 Allergy status to other drugs, medicaments and biological substances status: Secondary | ICD-10-CM | POA: Diagnosis not present

## 2019-11-20 DIAGNOSIS — C773 Secondary and unspecified malignant neoplasm of axilla and upper limb lymph nodes: Secondary | ICD-10-CM | POA: Insufficient documentation

## 2019-11-20 DIAGNOSIS — Z803 Family history of malignant neoplasm of breast: Secondary | ICD-10-CM | POA: Insufficient documentation

## 2019-11-20 DIAGNOSIS — M8580 Other specified disorders of bone density and structure, unspecified site: Secondary | ICD-10-CM | POA: Insufficient documentation

## 2019-11-20 DIAGNOSIS — Z79811 Long term (current) use of aromatase inhibitors: Secondary | ICD-10-CM | POA: Insufficient documentation

## 2019-11-20 DIAGNOSIS — Z923 Personal history of irradiation: Secondary | ICD-10-CM | POA: Insufficient documentation

## 2019-11-20 DIAGNOSIS — D518 Other vitamin B12 deficiency anemias: Secondary | ICD-10-CM

## 2019-11-20 LAB — CBC WITH DIFFERENTIAL (CANCER CENTER ONLY)
Abs Immature Granulocytes: 0 10*3/uL (ref 0.00–0.07)
Basophils Absolute: 0 10*3/uL (ref 0.0–0.1)
Basophils Relative: 1 %
Eosinophils Absolute: 0.1 10*3/uL (ref 0.0–0.5)
Eosinophils Relative: 1 %
HCT: 38.1 % (ref 36.0–46.0)
Hemoglobin: 12.2 g/dL (ref 12.0–15.0)
Immature Granulocytes: 0 %
Lymphocytes Relative: 39 %
Lymphs Abs: 1.6 10*3/uL (ref 0.7–4.0)
MCH: 28.8 pg (ref 26.0–34.0)
MCHC: 32 g/dL (ref 30.0–36.0)
MCV: 89.9 fL (ref 80.0–100.0)
Monocytes Absolute: 0.3 10*3/uL (ref 0.1–1.0)
Monocytes Relative: 8 %
Neutro Abs: 2.2 10*3/uL (ref 1.7–7.7)
Neutrophils Relative %: 51 %
Platelet Count: 199 10*3/uL (ref 150–400)
RBC: 4.24 MIL/uL (ref 3.87–5.11)
RDW: 13.2 % (ref 11.5–15.5)
WBC Count: 4.2 10*3/uL (ref 4.0–10.5)
nRBC: 0 % (ref 0.0–0.2)

## 2019-11-20 LAB — CMP (CANCER CENTER ONLY)
ALT: 12 U/L (ref 0–44)
AST: 15 U/L (ref 15–41)
Albumin: 3.8 g/dL (ref 3.5–5.0)
Alkaline Phosphatase: 79 U/L (ref 38–126)
Anion gap: 9 (ref 5–15)
BUN: 14 mg/dL (ref 8–23)
CO2: 26 mmol/L (ref 22–32)
Calcium: 10.2 mg/dL (ref 8.9–10.3)
Chloride: 107 mmol/L (ref 98–111)
Creatinine: 1 mg/dL (ref 0.44–1.00)
GFR, Estimated: >60 mL/min (ref >60)
Glucose, Bld: 96 mg/dL (ref 70–99)
Potassium: 3.7 mmol/L (ref 3.5–5.1)
Sodium: 142 mmol/L (ref 135–145)
Total Bilirubin: 0.5 mg/dL (ref 0.3–1.2)
Total Protein: 6.8 g/dL (ref 6.5–8.1)

## 2019-11-20 LAB — VITAMIN B12: Vitamin B-12: 212 pg/mL (ref 180–914)

## 2019-11-20 MED ORDER — CYANOCOBALAMIN 1000 MCG/ML IJ SOLN: 1000.0000 ug | Freq: Once | INTRAMUSCULAR | 11 refills | Status: DC

## 2019-12-01 ENCOUNTER — Other Ambulatory Visit: Payer: Self-pay | Admitting: Cardiovascular Disease

## 2019-12-02 ENCOUNTER — Other Ambulatory Visit: Payer: Self-pay

## 2019-12-02 MED ORDER — IRBESARTAN 150 MG PO TABS
150.0000 mg | ORAL_TABLET | Freq: Every day | ORAL | 3 refills | Status: DC
Start: 2019-12-02 — End: 2021-01-28

## 2019-12-02 MED ORDER — AMLODIPINE BESYLATE 5 MG PO TABS
5.0000 mg | ORAL_TABLET | Freq: Every day | ORAL | 3 refills | Status: DC
Start: 2019-12-02 — End: 2021-01-28

## 2019-12-02 MED ORDER — ISOSORBIDE MONONITRATE ER 30 MG PO TB24
30.0000 mg | ORAL_TABLET | Freq: Every day | ORAL | 3 refills | Status: DC
Start: 2019-12-02 — End: 2021-01-28

## 2019-12-02 NOTE — Telephone Encounter (Signed)
Pt's medications were sent to pt's pharmacy as requested. Confirmation received.  

## 2019-12-16 ENCOUNTER — Ambulatory Visit: Payer: Federal, State, Local not specified - PPO

## 2020-01-12 ENCOUNTER — Ambulatory Visit: Payer: Federal, State, Local not specified - PPO

## 2020-01-15 ENCOUNTER — Other Ambulatory Visit: Payer: Federal, State, Local not specified - PPO

## 2020-01-28 ENCOUNTER — Other Ambulatory Visit: Payer: Self-pay | Admitting: Cardiovascular Disease

## 2020-02-13 ENCOUNTER — Ambulatory Visit: Payer: Federal, State, Local not specified - PPO

## 2020-03-02 ENCOUNTER — Other Ambulatory Visit: Payer: Self-pay | Admitting: Cardiovascular Disease

## 2020-03-02 ENCOUNTER — Other Ambulatory Visit: Payer: Self-pay | Admitting: Nurse Practitioner

## 2020-03-02 DIAGNOSIS — C50212 Malignant neoplasm of upper-inner quadrant of left female breast: Secondary | ICD-10-CM

## 2020-03-10 ENCOUNTER — Ambulatory Visit: Payer: Federal, State, Local not specified - PPO

## 2020-03-23 ENCOUNTER — Other Ambulatory Visit: Payer: Self-pay | Admitting: Cardiovascular Disease

## 2020-03-23 ENCOUNTER — Other Ambulatory Visit: Payer: Self-pay | Admitting: *Deleted

## 2020-03-23 MED ORDER — ATORVASTATIN CALCIUM 40 MG PO TABS
ORAL_TABLET | ORAL | 3 refills | Status: DC
Start: 2020-03-23 — End: 2021-06-23

## 2020-03-30 ENCOUNTER — Telehealth: Payer: Self-pay | Admitting: Cardiovascular Disease

## 2020-03-30 MED ORDER — POTASSIUM CHLORIDE ER 10 MEQ PO TBCR
10.0000 meq | EXTENDED_RELEASE_TABLET | Freq: Every evening | ORAL | 1 refills | Status: DC
Start: 2020-03-30 — End: 2021-01-28

## 2020-03-30 NOTE — Telephone Encounter (Signed)
Pt's medication was resent to pt's pharmacy as requested. I apologize to the pt again for the inconvenience and informed her that I resent her medication to her preferred pharmacy and if there was any other problems, questions or concerns, to give our office a call back. Pt verbalized understanding.

## 2020-03-30 NOTE — Telephone Encounter (Signed)
Patient needs a refill for potassium chloride (KLOR-CON) 10 MEQ tablet. Says that person she spoke with refused to resend medication due to already sending it in before and was very disappointed in customer service and was comtemplating going to another office/cardiologist out of [place]. Apologized to patient and let her know that I will get this information to Dr. Burt Knack and nurse. Wants to know if Dr. Burt Knack wants her to stay on potassium chloride (KLOR-CON) 10 MEQ tablet. Says that she hasn't has medication for 2 weeks. Please call back.

## 2020-04-05 ENCOUNTER — Ambulatory Visit: Payer: Federal, State, Local not specified - PPO

## 2020-04-30 ENCOUNTER — Other Ambulatory Visit: Payer: Self-pay | Admitting: Hematology

## 2020-04-30 DIAGNOSIS — E2839 Other primary ovarian failure: Secondary | ICD-10-CM

## 2020-05-03 NOTE — Progress Notes (Incomplete)
[place]   Telephone:(336) [phone] Fax:(336) [phone]   Clinic Follow up Note   Patient Care Team: Rankins, Bill Salinas, MD as PCP - General (Family Medicine) Sherren Mocha, MD as PCP - Cardiology (Cardiology) Jovita Kussmaul, MD as Consulting Physician (General Surgery)  Date of Service:  05/03/2020  CHIEF COMPLAINT: Follow up left breast cancer  SUMMARY OF ONCOLOGIC HISTORY: Oncology History Overview Note  Breast cancer of upper-inner quadrant of left female breast [facility])   Staging form: Breast, AJCC 7th Edition     Clinical: Stage IIIA (T3, N1, M0) - Unsigned     Pathologic stage from 03/15/2015: Stage IIA (yT1c, N1a, cM0) - Signed by Truitt Merle, MD on 04/02/2015     Breast cancer of upper-inner quadrant of left female breast ([place])  08/06/2014 Initial Diagnosis   Breast 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   08/06/2014 Receptors her2   ER 100% positive, PR 5% positive, HER2-, Ki-67 50%   08/06/2014 Mammogram   Diagnostic mammogram and ultrasound showed left breast mass with associated calcification measuring 5.5 cm, indeterminate a  left axillary lymph node was cortical thickening, circumscribed low density oval 47m mass at 12:00 within the left breast.   09/03/2014 - 01/26/2015 Neo-Adjuvant Chemotherapy   ddAC every 2 weeks X4, followed by weekly Taxol X12    09/23/2014 Procedure   Genetic testing: Negative. Genes evaluated: ATM, BARD1, BRCA1, BRCA2, BRIP1, CDH1, CHEK2, EPCAM, FANCC, MLH1, MSH2, MSH6, NBN, PALB2, PMS2, PTEN, RAD51C, RAD51D, TP53, & XRCC2.    03/15/2015 Surgery   left lumpectomy and axillary node dissection (Marlou Starks   03/15/2015 Pathology Results   left lumpectomy showed G2 invasive ductal carcinoma, 2cm, G2, and DCIS, G2. the lateral margin was positive for  invasive tumor, 1 out of 15 nodes was positive.     ypT1c,ypN1a: Stage IIA    04/21/2015 Surgery   Re-excision for positive margin. Surgical path was negative for malignancy.   05/17/2015 - 06/28/2015 Radiation Therapy   adjuvant breast radiation (Isidore Moos. Left breast treated to 50 Gy in 25 fractions.  Left SCLV and PAB treated to 45 Gy in 25 fractions.  Left breast boost treated to 10 Gy in 5 fractions    07/12/2015 -  Anti-estrogen oral therapy   Letrozole 2.5 mg once daily. Planned duration of therapy: 10 years.    08/19/2015 Imaging   DEXA scan: Normal. (T-score -1.0)   08/24/2015 Mammogram   Diagnostic mammogram: No evidence for malignancy.    09/18/2016 Mammogram   IMPRESSION: Stable lumpectomy changes on the left. No evidence of malignancy bilaterally.   09/20/2017 Mammogram   09/20/2017 Mammogram IMPRESSION: Stable left breast lumpectomy site. No mammographic evidence of malignancy in the bilateral breasts.   09/26/2018 Mammogram   FINDINGS: There are lumpectomy changes in the lower inner left breast. Surgical changes in the inferior left axilla. No suspicious mass, suspicious microcalcification, or architectural distortion is identified to suggest malignancy in either breast. IMPRESSION: No evidence of malignancy in either breast.      CURRENT THERAPY:  Letrozole 2.5 mg once daily, started on 07/12/2015  INTERVAL HISTORY: *** Mary OEHLERis here for a follow up of left breast cancer. She was last seen by me 6 months ago. She presents to the clinic alone.    REVIEW OF SYSTEMS:  ***  Constitutional: Denies fevers, chills or abnormal weight loss Eyes: Denies blurriness of vision Ears, nose, mouth, throat, and face: Denies mucositis or sore throat Respiratory: Denies cough, dyspnea or wheezes Cardiovascular: Denies palpitation, chest discomfort or lower extremity swelling Gastrointestinal:  Denies nausea, heartburn or change in bowel habits Skin: Denies abnormal skin  rashes Lymphatics: Denies new lymphadenopathy or easy bruising Neurological:Denies numbness, tingling or new weaknesses Behavioral/Psych: Mood is stable, no new changes  All other systems were reviewed with the patient and are negative.  MEDICAL HISTORY:  Past Medical History:  Diagnosis Date  . Angina effort    none recent  . Arthritis    "right knee" (03/16/2015)  . Breast cancer, left ([place]) dx'd 2016  . 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 Family history of breast cancer   . Family history of breast cancer in female   . Family history of pancreatic cancer   . Hx of radiation therapy 05/17/15- 06/28/15   Left Breast  . Hypertension   . NSTEMI (non-ST elevated myocardial infarction) ([place]) 03/16/2014   non-obs CAD, spasm seen  . Personal history of chemotherapy   . Personal history of radiation therapy   . Pneumonia 2009    SURGICAL HISTORY: Past Surgical History:  Procedure Laterality Date  . BREAST BIOPSY Left 08/06/14  . BREAST LUMPECTOMY Left 2017   chemo and radiation  . BREAST LUMPECTOMY WITH NEEDLE LOCALIZATION AND AXILLARY LYMPH NODE DISSECTION Left 03/15/2015   Procedure: LEFT BREAST LUMPECTOMY WITH BRACKETED NEEDLE LOCALIZATION AND AXILLARY LYMPH NODE DISSECTION;  Surgeon: Autumn Messing III, MD;  Location: [place];  Service: General;  Laterality: Left;  . BUNIONECTOMY Bilateral   . CARDIAC CATHETERIZATION  2010; 03/16/2014  . ECTOPIC PREGNANCY SURGERY  1991   Tubal pregnancy  . LEFT HEART CATHETERIZATION WITH CORONARY ANGIOGRAM N/A 03/16/2014   Procedure: LEFT HEART CATHETERIZATION WITH CORONARY ANGIOGRAM;  Surgeon: Sherren Mocha, MD; Non-obs CAD, EF 55%  . PORT-A-CATH REMOVAL Right 03/15/2015   Procedure: REMOVAL PORT-A-CATH;  Surgeon: Autumn Messing III, MD;  Location: [place];  Service: General;  Laterality: Right;  . PORTACATH PLACEMENT N/A 08/28/2014   Procedure: INSERTION PORT-A-CATH;  Surgeon: Autumn Messing III, MD;  Location: [place];  Service: General;   Laterality: N/A;  . RE-EXCISION OF BREAST CANCER,SUPERIOR MARGINS Left 04/21/2015   Procedure: RE-EXCISION OF BREAST CANCER, INFERIOR MARGIN;  Surgeon: Autumn Messing III, MD;  Location: [facility];  Service: General;  Laterality: Left;  . TUBAL LIGATION  1981    I have reviewed the social history and family history with the patient and they are unchanged from previous note.  ALLERGIES:  is allergic to carvedilol, codeine, percocet [oxycodone-acetaminophen], adhesive [tape], and compazine [prochlorperazine].  MEDICATIONS:  Current Outpatient Medications  Medication Sig Dispense Refill  . amLODipine (NORVASC) 5 MG tablet Take 1 tablet (5 mg total) by mouth daily. 90 tablet 3  . atorvastatin (LIPITOR) 40 MG tablet TAKE 1 TABLET BY MOUTH EVERY DAY AT 6 PM 90 tablet 3  . hydrochlorothiazide (HYDRODIURIL) 25 MG tablet TAKE 1 TABLET BY MOUTH ONCE DAILY 90 tablet 3  . irbesartan (AVAPRO) 150 MG tablet Take 1 tablet (150 mg total) by mouth daily. 90 tablet 3  . isosorbide mononitrate (IMDUR) 30 MG 24 hr tablet Take 1 tablet (30 mg total) by mouth daily. 90 tablet 3  . letrozole (FEMARA) 2.5 MG tablet TAKE 1 TABLET(2.5 MG) BY MOUTH DAILY 90 tablet 3  . nitroGLYCERIN (NITROSTAT) 0.4 MG SL tablet  Place 1 tablet (0.4 mg total) under the tongue every 5 (five) minutes as needed for chest pain. 25 tablet 12  . potassium chloride (KLOR-CON) 10 MEQ tablet Take 1 tablet (10 mEq total) by mouth every evening. 90 tablet 1   No current facility-administered medications for this visit.    PHYSICAL EXAMINATION: ECOG PERFORMANCE STATUS: {CHL ONC ECOG PS:[id number]}  There were no vitals filed for this visit. There were no vitals filed for this visit. *** GENERAL:alert, no distress and comfortable SKIN: skin color, texture, turgor are normal, no rashes or significant lesions EYES: normal, Conjunctiva are pink and non-injected, sclera clear {OROPHARYNX:no exudate, no erythema and lips, buccal mucosa, and tongue  normal}  NECK: supple, thyroid normal size, non-tender, without nodularity LYMPH:  no palpable lymphadenopathy in the cervical, axillary {or inguinal} LUNGS: clear to auscultation and percussion with normal breathing effort HEART: regular rate & rhythm and no murmurs and no lower extremity edema ABDOMEN:abdomen soft, non-tender and normal bowel sounds Musculoskeletal:no cyanosis of digits and no clubbing  NEURO: alert & oriented x 3 with fluent speech, no focal motor/sensory deficits  LABORATORY DATA:  I have reviewed the data as listed CBC Latest Ref Rng & Units 11/20/2019 05/12/2019 05/02/2019  WBC 4.0 - 10.5 K/uL 4.2 3.8(L) 3.8(L)  Hemoglobin 12.0 - 15.0 g/dL 12.2 12.1 12.8  Hematocrit 36.0 - 46.0 % 38.1 38.1 40.2  Platelets 150 - 400 K/uL 199 198 213     CMP Latest Ref Rng & Units 11/20/2019 05/12/2019 11/07/2018  Glucose 70 - 99 mg/dL 96 93 117(H)  BUN 8 - 23 mg/dL _0 Creatinine 0.44 - 1.00 mg/dL 1.00 0.93 0.99  Sodium 135 - 145 mmol/L 142 147(H) 143  Potassium 3.5 - 5.1 mmol/L 3.7 3.7 3.8  Chloride 98 - 111 mmol/L 107 108 105  CO2 22 - 32 mmol/L _1 Calcium 8.9 - 10.3 mg/dL 10.2 10.2 10.4(H)  Total Protein 6.5 - 8.1 g/dL 6.8 6.8 6.9  Total Bilirubin 0.3 - 1.2 mg/dL 0.5 0.6 0.7  Alkaline Phos 38 - 126 U/L 79 73 76  AST 15 - 41 U/L _2 ALT 0 - 44 U/L _3 RADIOGRAPHIC STUDIES: I have personally reviewed the radiological images as listed and agreed with the findings in the report. No results found.   ASSESSMENT & PLAN:  SARGUN RUMMELL is a 65 y.o. female with    1. Left breast invasive ductal carcinoma, cT3N1M0, stage IIIA, ER+/PR+/HER2-, ypT1cN1aM0 after neoadjuvant chemotherapy -She was diagnosed in 08/2014. She is s/p neoadjuvant chemo ddAC-T, left breast lumpectomy and axillary re-excision surgeries and adjuvant Radiation.  -She has been on antiestrogen therapy with Letrozole since 07/2015, tolerating well so far. We'll continue for 7-10  years. -She is clinically doing well, finds.  Lab reviewed, no concerns, exam was unremarkable.  No clinical concern for recurrence. -Continue breast cancer surveillance, last mammogram in October 2021 was normal -Follow-up in 6 months   2.Osteopenia -Her 08/1015 DEXA was normal with lowestT score -1.0. She became osteopenic with lowest t-score on -1.3 at forearm on 10/2017. -She has hypercalcemia, not on calcium.Irecommend she take Vit D and toremain active. -Plan for next DEXA on 01/15/20  3. CancerTesting was negative for pathogenetic mutations.  4. History of non-STEMI in 03/2014 -She had cardiac catheterization, which showed mild diffuse coronary artery disease, but no need intervention. -She will follow-up with her cardiologist Dr. Burt Knack   5. Hypercalcemia -  Her calcium has been around 11and remained elevated. -Her vitamin D level is low, PTH was normal. -I encouraged her to follow-up with her primary care physician Dr. Radene Ou.   6. 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   7.  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   No orders of the defined types were placed in this encounter.  All questions were answered. The patient knows to call the clinic with any problems, questions or concerns. No barriers to learning was detected. The total time spent in the  appointment was {CHL ONC TIME VISIT - YTKPT:[id number]}.     Mary Randall 05/03/2020   Oneal Deputy, am acting as scribe for Truitt Merle, MD.   {Add scribe attestation statement}

## 2020-05-04 ENCOUNTER — Other Ambulatory Visit: Payer: Federal, State, Local not specified - PPO

## 2020-05-06 ENCOUNTER — Other Ambulatory Visit: Payer: Self-pay

## 2020-05-06 DIAGNOSIS — E538 Deficiency of other specified B group vitamins: Secondary | ICD-10-CM

## 2020-05-06 DIAGNOSIS — D539 Nutritional anemia, unspecified: Secondary | ICD-10-CM

## 2020-05-07 ENCOUNTER — Telehealth: Payer: Self-pay | Admitting: Hematology

## 2020-05-07 ENCOUNTER — Ambulatory Visit: Payer: Federal, State, Local not specified - PPO

## 2020-05-07 ENCOUNTER — Inpatient Hospital Stay: Payer: Federal, State, Local not specified - PPO

## 2020-05-07 ENCOUNTER — Inpatient Hospital Stay: Payer: Federal, State, Local not specified - PPO | Admitting: Hematology

## 2020-05-07 DIAGNOSIS — T451X5A Adverse effect of antineoplastic and immunosuppressive drugs, initial encounter: Secondary | ICD-10-CM

## 2020-05-07 DIAGNOSIS — Z170 Estrogen receptor positive status [ER+]: Secondary | ICD-10-CM

## 2020-05-07 NOTE — Telephone Encounter (Signed)
R/s appts per 5/6 sch msg. Pt aware.  

## 2020-05-11 ENCOUNTER — Inpatient Hospital Stay: Payer: Federal, State, Local not specified - PPO | Attending: Hematology

## 2020-05-11 ENCOUNTER — Inpatient Hospital Stay: Payer: Federal, State, Local not specified - PPO | Admitting: Nurse Practitioner

## 2020-05-11 ENCOUNTER — Encounter: Payer: Self-pay | Admitting: Nurse Practitioner

## 2020-05-11 ENCOUNTER — Other Ambulatory Visit: Payer: Self-pay

## 2020-05-11 VITALS — BP 123/91 | HR 75 | Temp 97.8°F | Resp 16 | Ht 66.0 in | Wt 165.0 lb

## 2020-05-11 DIAGNOSIS — Z9221 Personal history of antineoplastic chemotherapy: Secondary | ICD-10-CM | POA: Insufficient documentation

## 2020-05-11 DIAGNOSIS — Z170 Estrogen receptor positive status [ER+]: Secondary | ICD-10-CM | POA: Diagnosis not present

## 2020-05-11 DIAGNOSIS — C773 Secondary and unspecified malignant neoplasm of axilla and upper limb lymph nodes: Secondary | ICD-10-CM | POA: Diagnosis not present

## 2020-05-11 DIAGNOSIS — D531 Other megaloblastic anemias, not elsewhere classified: Secondary | ICD-10-CM | POA: Diagnosis not present

## 2020-05-11 DIAGNOSIS — C50212 Malignant neoplasm of upper-inner quadrant of left female breast: Secondary | ICD-10-CM

## 2020-05-11 DIAGNOSIS — Z803 Family history of malignant neoplasm of breast: Secondary | ICD-10-CM | POA: Diagnosis not present

## 2020-05-11 DIAGNOSIS — Z800 Family history of malignant neoplasm of digestive organs: Secondary | ICD-10-CM | POA: Diagnosis not present

## 2020-05-11 DIAGNOSIS — E538 Deficiency of other specified B group vitamins: Secondary | ICD-10-CM | POA: Insufficient documentation

## 2020-05-11 DIAGNOSIS — M8580 Other specified disorders of bone density and structure, unspecified site: Secondary | ICD-10-CM | POA: Insufficient documentation

## 2020-05-11 DIAGNOSIS — Z923 Personal history of irradiation: Secondary | ICD-10-CM | POA: Diagnosis not present

## 2020-05-11 DIAGNOSIS — I252 Old myocardial infarction: Secondary | ICD-10-CM | POA: Diagnosis not present

## 2020-05-11 DIAGNOSIS — D539 Nutritional anemia, unspecified: Secondary | ICD-10-CM

## 2020-05-11 LAB — CBC WITH DIFFERENTIAL (CANCER CENTER ONLY)
Abs Immature Granulocytes: 0.01 10*3/uL (ref 0.00–0.07)
Basophils Absolute: 0 10*3/uL (ref 0.0–0.1)
Basophils Relative: 1 %
Eosinophils Absolute: 0.1 10*3/uL (ref 0.0–0.5)
Eosinophils Relative: 2 %
HCT: 37.8 % (ref 36.0–46.0)
Hemoglobin: 11.9 g/dL — ABNORMAL LOW (ref 12.0–15.0)
Immature Granulocytes: 0 %
Lymphocytes Relative: 40 %
Lymphs Abs: 1.4 10*3/uL (ref 0.7–4.0)
MCH: 28.3 pg (ref 26.0–34.0)
MCHC: 31.5 g/dL (ref 30.0–36.0)
MCV: 90 fL (ref 80.0–100.0)
Monocytes Absolute: 0.3 10*3/uL (ref 0.1–1.0)
Monocytes Relative: 9 %
Neutro Abs: 1.7 10*3/uL (ref 1.7–7.7)
Neutrophils Relative %: 48 %
Platelet Count: 182 10*3/uL (ref 150–400)
RBC: 4.2 MIL/uL (ref 3.87–5.11)
RDW: 13.6 % (ref 11.5–15.5)
WBC Count: 3.4 10*3/uL — ABNORMAL LOW (ref 4.0–10.5)
nRBC: 0 % (ref 0.0–0.2)

## 2020-05-11 LAB — CMP (CANCER CENTER ONLY)
ALT: 10 U/L (ref 0–44)
AST: 16 U/L (ref 15–41)
Albumin: 3.8 g/dL (ref 3.5–5.0)
Alkaline Phosphatase: 77 U/L (ref 38–126)
Anion gap: 9 (ref 5–15)
BUN: 14 mg/dL (ref 8–23)
CO2: 27 mmol/L (ref 22–32)
Calcium: 10 mg/dL (ref 8.9–10.3)
Chloride: 105 mmol/L (ref 98–111)
Creatinine: 0.99 mg/dL (ref 0.44–1.00)
GFR, Estimated: >60 mL/min (ref >60)
Glucose, Bld: 101 mg/dL — ABNORMAL HIGH (ref 70–99)
Potassium: 3.6 mmol/L (ref 3.5–5.1)
Sodium: 141 mmol/L (ref 135–145)
Total Bilirubin: 0.5 mg/dL (ref 0.3–1.2)
Total Protein: 6.6 g/dL (ref 6.5–8.1)

## 2020-05-11 NOTE — Progress Notes (Signed)
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   08/06/2014 Receptors her2   ER 100% positive, PR 5% positive, HER2-, Ki-67 50%   08/06/2014 Mammogram   Diagnostic mammogram and ultrasound showed left breast mass with associated calcification measuring 5.5 cm, indeterminate a  left axillary lymph node was cortical thickening, circumscribed low density oval 44m mass at 12:00 within the left breast.   09/03/2014 - 01/26/2015 Neo-Adjuvant Chemotherapy   ddAC every 2 weeks X4, followed by weekly Taxol X12    09/23/2014 Procedure   Genetic testing: Negative. Genes evaluated: ATM, BARD1, BRCA1, BRCA2, BRIP1, CDH1, CHEK2, EPCAM, FANCC, MLH1, MSH2, MSH6, NBN, PALB2, PMS2, PTEN, RAD51C, RAD51D, TP53, & XRCC2.    03/15/2015 Surgery   left lumpectomy and axillary node dissection (Marlou Starks   03/15/2015 Pathology Results   left lumpectomy showed G2 invasive ductal carcinoma, 2cm, G2, and DCIS, G2. the lateral margin was positive for invasive tumor, 1 out of  15 nodes was positive.     ypT1c,ypN1a: Stage IIA    04/21/2015 Surgery   Re-excision for positive margin. Surgical path was negative for malignancy.   05/17/2015 - 06/28/2015 Radiation Therapy   adjuvant breast radiation (Isidore Moos. Left breast treated to 50 Gy in 25 fractions.  Left SCLV and PAB treated to 45 Gy in 25 fractions.  Left breast boost treated to 10 Gy in 5 fractions    07/12/2015 -  Anti-estrogen oral therapy   Letrozole 2.5 mg once daily. Planned duration of therapy: 10 years.    08/19/2015 Imaging   DEXA scan: Normal. (T-score -1.0)   08/24/2015 Mammogram   Diagnostic mammogram: No evidence for malignancy.    09/18/2016 Mammogram   IMPRESSION: Stable lumpectomy changes on the left. No evidence of malignancy bilaterally.   09/20/2017 Mammogram   09/20/2017 Mammogram IMPRESSION: Stable left breast lumpectomy site. No mammographic evidence of malignancy in the bilateral breasts.   09/26/2018 Mammogram   FINDINGS: There are lumpectomy changes in the lower inner left breast. Surgical changes in the inferior left axilla. No suspicious mass, suspicious microcalcification, or architectural distortion is identified to suggest malignancy in either breast. IMPRESSION: No evidence of malignancy in either breast.     CURRENT THERAPY: Letrozole 2.5 mg once daily, started 07/2015  INTERVAL HISTORY: Mary Randall for follow up as scheduled.  She was last seen by Dr. FBurr Medicoon 11/20/2019.  Denies changes in her health.  She continues letrozole, tolerating well except hearing loss which is causing her to consider stopping AI.  She continues  monthly B12 injections at home, tolerating well.  Denies new breast lump/mass, nipple discharge or inversion, or skin change.,  Hot flashes, mood fluctuations, change in bowel habits, GI/GYN bleeding, fever, chills, cough, chest pain, dyspnea.  She is due for Pap smear next year. She is up-to-date on colonoscopy.  She is active at work but not much  exercise otherwise.  She does not smoke, drinks alcohol rarely   MEDICAL HISTORY:  Past Medical History:  Diagnosis Date  . Angina effort    none recent  . Arthritis    "right knee" (03/16/2015)  . Breast cancer, left ([place]) dx'd 2016  . 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 Family history of breast cancer   . Family history of breast cancer in female   . Family history of pancreatic cancer   . Hx of radiation therapy 05/17/15- 06/28/15   Left Breast  . Hypertension   . NSTEMI (non-ST elevated myocardial infarction) ([place]) 03/16/2014   non-obs CAD, spasm seen  . Personal history of chemotherapy   . Personal history of radiation therapy   . Pneumonia 2009    SURGICAL HISTORY: Past Surgical History:  Procedure Laterality Date  . BREAST BIOPSY Left 08/06/14  . BREAST LUMPECTOMY Left 2017   chemo and radiation  . BREAST LUMPECTOMY WITH NEEDLE LOCALIZATION AND AXILLARY LYMPH NODE DISSECTION Left 03/15/2015   Procedure: LEFT BREAST LUMPECTOMY WITH BRACKETED NEEDLE LOCALIZATION AND AXILLARY LYMPH NODE DISSECTION;  Surgeon: Autumn Messing III, MD;  Location: [place];  Service: General;  Laterality: Left;  . BUNIONECTOMY Bilateral   . CARDIAC CATHETERIZATION  2010; 03/16/2014  . ECTOPIC PREGNANCY SURGERY  1991   Tubal pregnancy  . LEFT HEART CATHETERIZATION WITH CORONARY ANGIOGRAM N/A 03/16/2014   Procedure: LEFT HEART CATHETERIZATION WITH CORONARY ANGIOGRAM;  Surgeon: Sherren Mocha, MD; Non-obs CAD, EF 55%  . PORT-A-CATH REMOVAL Right 03/15/2015   Procedure: REMOVAL PORT-A-CATH;  Surgeon: Autumn Messing III, MD;  Location: [place];  Service: General;  Laterality: Right;  . PORTACATH PLACEMENT N/A 08/28/2014   Procedure: INSERTION PORT-A-CATH;  Surgeon: Autumn Messing III, MD;  Location: [place];  Service: General;  Laterality: N/A;  . RE-EXCISION OF BREAST CANCER,SUPERIOR MARGINS Left 04/21/2015   Procedure: RE-EXCISION OF BREAST CANCER, INFERIOR MARGIN;  Surgeon: Autumn Messing III, MD;   [facility];  Service: General;  Laterality: Left;  . TUBAL LIGATION  1981    I have reviewed the social history and family history with the patient and they are unchanged from previous note.  ALLERGIES:  is allergic to carvedilol, codeine, percocet [oxycodone-acetaminophen], adhesive [tape], and compazine [prochlorperazine].  MEDICATIONS:  Current Outpatient Medications  Medication Sig Dispense Refill  . amLODipine (NORVASC) 5 MG tablet Take 1 tablet (5 mg total) by mouth daily. 90 tablet 3  . atorvastatin (LIPITOR) 40 MG tablet TAKE 1 TABLET BY MOUTH EVERY DAY AT 6 PM 90 tablet 3  . hydrochlorothiazide (HYDRODIURIL) 25 MG tablet TAKE 1 TABLET BY MOUTH ONCE DAILY 90 tablet 3  . irbesartan (AVAPRO) 150 MG tablet Take 1 tablet (150 mg total) by mouth daily. 90 tablet 3  . isosorbide mononitrate (IMDUR) 30 MG 24 hr tablet Take 1 tablet (30 mg total) by mouth daily. 90 tablet 3  . letrozole (FEMARA) 2.5 MG tablet TAKE 1 TABLET(2.5 MG) BY MOUTH DAILY 90 tablet 3  . nitroGLYCERIN (NITROSTAT) 0.4 MG SL tablet Place 1 tablet (0.4 mg total) under the tongue every 5 (five) minutes as needed for  chest pain. 25 tablet 12  . potassium chloride (KLOR-CON) 10 MEQ tablet Take 1 tablet (10 mEq total) by mouth every evening. 90 tablet 1   No current facility-administered medications for this visit.    PHYSICAL EXAMINATION: ECOG PERFORMANCE STATUS: 0 - Asymptomatic  Vitals:   05/11/20 0941  BP: (!) 123/91  Pulse: 75  Resp: 16  Temp: 97.8 F (36.6 C)  SpO2: 99%   Filed Weights   05/11/20 0941  Weight: 165 lb (74.8 kg)    GENERAL:alert, no distress and comfortable SKIN: No rash EYES:  sclera clear NECK: Without mass LYMPH:  no palpable cervical or supraclavicular lymphadenopathy  LUNGS: clear with normal breathing effort HEART: regular rate & rhythm, no lower extremity edema NEURO: alert & oriented x 3 with fluent speech, no focal motor/sensory deficits Breast exam: Breasts are  symmetric without nipple discharge or inversion.  S/p left lumpectomy, incisions completely healed.  Mild skin thickening in the lower inner quadrant.  No palpable mass in either breast or axilla that I could appreciate.   LABORATORY DATA:  I have reviewed the data as listed CBC Latest Ref Rng & Units 05/11/2020 11/20/2019 05/12/2019  WBC 4.0 - 10.5 K/uL 3.4(L) 4.2 3.8(L)  Hemoglobin 12.0 - 15.0 g/dL 11.9(L) 12.2 12.1  Hematocrit 36.0 - 46.0 % 37.8 38.1 38.1  Platelets 150 - 400 K/uL 182 199 198     CMP Latest Ref Rng & Units 05/11/2020 11/20/2019 05/12/2019  Glucose 70 - 99 mg/dL 101(H) 96 93  BUN 8 - 23 mg/dL _0 Creatinine 0.44 - 1.00 mg/dL 0.99 1.00 0.93  Sodium 135 - 145 mmol/L 141 142 147(H)  Potassium 3.5 - 5.1 mmol/L 3.6 3.7 3.7  Chloride 98 - 111 mmol/L 105 107 108  CO2 22 - 32 mmol/L _1 Calcium 8.9 - 10.3 mg/dL 10.0 10.2 10.2  Total Protein 6.5 - 8.1 g/dL 6.6 6.8 6.8  Total Bilirubin 0.3 - 1.2 mg/dL 0.5 0.5 0.6  Alkaline Phos 38 - 126 U/L 77 79 73  AST 15 - 41 U/L _2 ALT 0 - 44 U/L _3 RADIOGRAPHIC STUDIES: I have personally reviewed the radiological images as listed and agreed with the findings in the report. No results found.   ASSESSMENT & PLAN: Mary Randall is a 65 y.o. female with    1. Left breast invasive ductal carcinoma, cT3N1M0, stage IIIA, ER+/PR+/HER2-, ypT1cN1aM0 after neoadjuvant chemotherapy -Diagnosed in 08/2014, S/p neoadjuvant chemo ddAC-T, left breast lumpectomy and axillary re-excision surgeries and adjuvant Radiation.  -She started antiestrogen therapy with Letrozole since 07/2015, tolerating well without side effects except hair loss. She is considering stopping AI because of this. I recommend hair/nail vitamin po once daily. I also discussed changing to exemestane to see if hair loss stops, to get her to goal AI for 7-10 years.she will consider, but for now will continue letrozole. -Mary Randall is clinically doing well.  Labs stable. Breast exam is benign. No clinical concern for recurrence.  -she is over 5 years from initial diagnosis, the recurrence risk has decreased. Continue surveillance. Next mammogram 10/2020.  -F/up in 6 months   2.Osteopenia -Her 08/1015 DEXA was normal with lowestT score -1.0. She became osteopenic with lowest t-score on -1.3 at forearm on 10/2017. -She has hypercalcemia, not on calcium.I recommend to take vitamin D. she walks frequently at work  -DEXA scheduled 05/12/20. Will call her with results.   3.  CancerTesting was negative for pathogenetic mutations.  4. History of non-STEMI in 03/2014 -She had cardiac catheterization, which showed mild diffuse coronary artery disease, but no need intervention. -She will follow-up with her cardiologist Dr. Burt Knack  5. Hypercalcemia -Her calcium has been around 11and remained elevated. -Her vitamin D level is low, PTH was normal. -I encouraged her to follow-up with her primary care physician Dr. Radene Ou.  -Ca 10.0 today  6.  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 She continues monthly B12 injection at home -Hgb 10-12 since 2021, 11.9 today. B12 was not drawn by lab as ordered. Will request PCP Dr. Radene Ou to draw at next visit -continue monthly B12 indefinitely   PLAN: -Labs, mammogram 10/2019 reviewed -Continue breast cancer surveillance and letrozole -Continue monthly B12 injection at home, will request Dr. Radene Ou to draw level at next visit -Lab and f/up with [place] in 6 months, or sooner if needed    Orders Placed This Encounter  Procedures  . MM DIAG BREAST TOMO BILATERAL    Standing Status:   Future    Standing Expiration Date:   05/11/2021    Order Specific Question:   Reason for Exam (SYMPTOM  OR DIAGNOSIS REQUIRED)    Answer:   h/o left breast cancer 2016    Order  Specific Question:   Preferred imaging location?    Answer:   [facility]   All questions were answered. The patient knows to call the clinic with any problems, questions or concerns. No barriers to learning were detected.     Alla Feeling, NP 05/11/20

## 2020-05-12 ENCOUNTER — Ambulatory Visit
Admission: RE | Admit: 2020-05-12 | Discharge: 2020-05-12 | Disposition: A | Discharge status: Home / Self Care | Payer: Federal, State, Local not specified - PPO | Source: Ambulatory Visit | Attending: Hematology | Admitting: Hematology

## 2020-05-12 DIAGNOSIS — E2839 Other primary ovarian failure: Secondary | ICD-10-CM

## 2020-05-12 DIAGNOSIS — M8588 Other specified disorders of bone density and structure, other site: Secondary | ICD-10-CM | POA: Diagnosis not present

## 2020-05-12 DIAGNOSIS — Z780 Asymptomatic menopausal state: Secondary | ICD-10-CM | POA: Diagnosis not present

## 2020-05-19 ENCOUNTER — Other Ambulatory Visit: Payer: Federal, State, Local not specified - PPO

## 2020-05-19 ENCOUNTER — Telehealth: Payer: Self-pay

## 2020-05-19 ENCOUNTER — Ambulatory Visit: Payer: Federal, State, Local not specified - PPO | Admitting: Hematology

## 2020-05-19 NOTE — Telephone Encounter (Signed)
Called attempted to speak to pt concerning most recent labs no answer x 2 unable to leave message , mailbox full. Attempts to contact to continue

## 2020-05-19 NOTE — Telephone Encounter (Signed)
-----   Message from Truitt Merle, MD sent at 05/18/2020  4:13 PM EDT ----- Please let pt know her DEXA result, mild osteopenia, no high risk features, I suggest vitD supplement (she has high calcium level before), thanks   Truitt Merle  05/18/2020

## 2020-09-13 ENCOUNTER — Telehealth: Payer: Self-pay | Admitting: Hematology

## 2020-09-13 NOTE — Telephone Encounter (Signed)
Rescheduled upcoming appointment due to provider's breast clinic. Patient is aware of changes. Mailed calendar.

## 2020-09-30 ENCOUNTER — Ambulatory Visit: Payer: Federal, State, Local not specified - PPO | Admitting: Physician Assistant

## 2020-10-19 ENCOUNTER — Other Ambulatory Visit: Payer: Self-pay

## 2020-10-19 ENCOUNTER — Ambulatory Visit
Admission: RE | Admit: 2020-10-19 | Discharge: 2020-10-19 | Disposition: A | Discharge status: Home / Self Care | Payer: Federal, State, Local not specified - PPO | Source: Ambulatory Visit | Attending: Nurse Practitioner | Admitting: Nurse Practitioner

## 2020-10-19 ENCOUNTER — Other Ambulatory Visit: Payer: Self-pay | Admitting: Nurse Practitioner

## 2020-10-19 DIAGNOSIS — C50212 Malignant neoplasm of upper-inner quadrant of left female breast: Secondary | ICD-10-CM

## 2020-10-19 DIAGNOSIS — Z1231 Encounter for screening mammogram for malignant neoplasm of breast: Secondary | ICD-10-CM

## 2020-11-10 ENCOUNTER — Ambulatory Visit: Payer: Federal, State, Local not specified - PPO | Admitting: Hematology

## 2020-11-10 ENCOUNTER — Other Ambulatory Visit: Payer: Federal, State, Local not specified - PPO

## 2020-11-11 ENCOUNTER — Ambulatory Visit: Payer: Federal, State, Local not specified - PPO | Admitting: Hematology

## 2020-11-11 ENCOUNTER — Other Ambulatory Visit: Payer: Federal, State, Local not specified - PPO

## 2020-11-12 ENCOUNTER — Inpatient Hospital Stay: Payer: Federal, State, Local not specified - PPO | Admitting: Hematology

## 2020-11-12 ENCOUNTER — Other Ambulatory Visit: Payer: Self-pay

## 2020-11-12 ENCOUNTER — Inpatient Hospital Stay: Payer: Federal, State, Local not specified - PPO | Attending: Nurse Practitioner

## 2020-11-12 VITALS — BP 146/101 | HR 61 | Temp 98.3°F | Resp 17 | Wt 165.0 lb

## 2020-11-12 DIAGNOSIS — Z170 Estrogen receptor positive status [ER+]: Secondary | ICD-10-CM

## 2020-11-12 DIAGNOSIS — E538 Deficiency of other specified B group vitamins: Secondary | ICD-10-CM | POA: Diagnosis not present

## 2020-11-12 DIAGNOSIS — D531 Other megaloblastic anemias, not elsewhere classified: Secondary | ICD-10-CM | POA: Insufficient documentation

## 2020-11-12 DIAGNOSIS — C50212 Malignant neoplasm of upper-inner quadrant of left female breast: Secondary | ICD-10-CM | POA: Diagnosis not present

## 2020-11-12 DIAGNOSIS — Z79811 Long term (current) use of aromatase inhibitors: Secondary | ICD-10-CM | POA: Diagnosis not present

## 2020-11-12 DIAGNOSIS — M8580 Other specified disorders of bone density and structure, unspecified site: Secondary | ICD-10-CM | POA: Insufficient documentation

## 2020-11-12 DIAGNOSIS — Z1231 Encounter for screening mammogram for malignant neoplasm of breast: Secondary | ICD-10-CM

## 2020-11-12 DIAGNOSIS — C773 Secondary and unspecified malignant neoplasm of axilla and upper limb lymph nodes: Secondary | ICD-10-CM | POA: Diagnosis not present

## 2020-11-12 DIAGNOSIS — D539 Nutritional anemia, unspecified: Secondary | ICD-10-CM

## 2020-11-12 LAB — CMP (CANCER CENTER ONLY)
ALT: 11 U/L (ref 0–44)
AST: 15 U/L (ref 15–41)
Albumin: 3.7 g/dL (ref 3.5–5.0)
Alkaline Phosphatase: 74 U/L (ref 38–126)
Anion gap: 9 (ref 5–15)
BUN: 12 mg/dL (ref 8–23)
CO2: 26 mmol/L (ref 22–32)
Calcium: 10.5 mg/dL — ABNORMAL HIGH (ref 8.9–10.3)
Chloride: 108 mmol/L (ref 98–111)
Creatinine: 0.97 mg/dL (ref 0.44–1.00)
GFR, Estimated: >60 mL/min (ref >60)
Glucose, Bld: 100 mg/dL — ABNORMAL HIGH (ref 70–99)
Potassium: 4 mmol/L (ref 3.5–5.1)
Sodium: 143 mmol/L (ref 135–145)
Total Bilirubin: 0.6 mg/dL (ref 0.3–1.2)
Total Protein: 6.9 g/dL (ref 6.5–8.1)

## 2020-11-12 LAB — CBC WITH DIFFERENTIAL (CANCER CENTER ONLY)
Abs Immature Granulocytes: 0.01 10*3/uL (ref 0.00–0.07)
Basophils Absolute: 0 10*3/uL (ref 0.0–0.1)
Basophils Relative: 0 %
Eosinophils Absolute: 0.1 10*3/uL (ref 0.0–0.5)
Eosinophils Relative: 1 %
HCT: 37.1 % (ref 36.0–46.0)
Hemoglobin: 11.8 g/dL — ABNORMAL LOW (ref 12.0–15.0)
Immature Granulocytes: 0 %
Lymphocytes Relative: 32 %
Lymphs Abs: 1.3 10*3/uL (ref 0.7–4.0)
MCH: 28.4 pg (ref 26.0–34.0)
MCHC: 31.8 g/dL (ref 30.0–36.0)
MCV: 89.2 fL (ref 80.0–100.0)
Monocytes Absolute: 0.3 10*3/uL (ref 0.1–1.0)
Monocytes Relative: 8 %
Neutro Abs: 2.3 10*3/uL (ref 1.7–7.7)
Neutrophils Relative %: 59 %
Platelet Count: 275 10*3/uL (ref 150–400)
RBC: 4.16 MIL/uL (ref 3.87–5.11)
RDW: 13.2 % (ref 11.5–15.5)
WBC Count: 4 10*3/uL (ref 4.0–10.5)
nRBC: 0 % (ref 0.0–0.2)

## 2020-11-12 LAB — VITAMIN B12: Vitamin B-12: 447 pg/mL (ref 180–914)

## 2020-11-12 NOTE — Progress Notes (Signed)
Mary Randall   [phone] [phone]   Clinic Follow up Note   Patient Care Team: Rankins, Bill Salinas, MD as PCP - General (Family Medicine) Sherren Mocha, MD as PCP - Cardiology (Cardiology) Jovita Kussmaul, MD as Consulting Physician (General Surgery)  Date of Service:  11/12/2020  CHIEF COMPLAINT: f/u of left breast cancer  CURRENT THERAPY:  Letrozole 2.5 mg once daily, started 07/2015  ASSESSMENT & PLAN:  Mary Randall is a 65 y.o. female with   1. Left breast invasive ductal carcinoma, cT3N1M0, stage IIIA, ER+/PR+/HER2-, ypT1cN1aM0 after neoadjuvant chemotherapy -Diagnosed in 08/2014, S/p neoadjuvant chemo ddAC-T, left breast lumpectomy and axillary re-excision surgeries and adjuvant Radiation.  -She started antiestrogen therapy with Letrozole since 07/2015, tolerating well without side effects except hair loss.  -last MM 10/19/20 was negative -Mary Randall is clinically doing well. Labs stable. Breast exam is benign. No clinical concern for recurrence.  -Due to her significant hair loss, I will hold letrozole until after New year, and let her try Rogaine. She is agreeable. Plan to continue Letrozole until July 2024   -Continue surveillance. Next mammogram 10/2021.  -F/up in one year    2. Osteopenia, Hypercalcemia -She became osteopenic with lowest t-score on -1.3 at forearm on 10/2017. Repeat DEXA 05/12/20 was slightly improved to -1.1.  -She has hypercalcemia, not on calcium. I recommend to take vitamin D. she walks frequently at work    3. Cancer Testing was negative for pathogenetic mutations.    4. History of non-STEMI in 03/2014 -She had cardiac catheterization, which showed mild diffuse coronary artery disease, but no need intervention.  -She will follow-up with her cardiologist Dr. Burt Knack   5.  Megaloblastic anemia secondary to B12 deficiency -Intrinsic factor was strongly positive, she needs lifelong B12 injection  monthly -Macrocytosis resolved since she started B12 injection. She continues monthly B12 injection at home -Hgb 10-12 since 2021. -continue monthly B12 indefinitely     PLAN: -Hold letrozole for now and restart on 01/02/2021, she will try Rogaine for hair loss  -Continue monthly B12 injection at home -Lab and f/up in one year    No problem-specific Assessment & Plan notes found for this encounter.   SUMMARY OF ONCOLOGIC HISTORY: Oncology History Overview Note  Breast cancer of upper-inner quadrant of left female breast ([place])   Staging form: Breast, AJCC 7th Edition     Clinical: Stage IIIA (T3, N1, M0) - Unsigned     Pathologic stage from 03/15/2015: Stage IIA (yT1c, N1a, cM0) - Signed by Truitt Merle, MD on 04/02/2015     Breast cancer of upper-inner quadrant of left female breast ([place])  08/06/2014 Initial Diagnosis   Breast 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   08/06/2014 Receptors her2   ER 100% positive, PR 5% positive, HER2-, Ki-67 50%   08/06/2014 Mammogram   Diagnostic mammogram and ultrasound showed left breast mass with associated calcification measuring 5.5 cm, indeterminate a  left axillary lymph node was cortical thickening, circumscribed low density oval 51m mass at 12:00 within the left breast.   09/03/2014 - 01/26/2015 Neo-Adjuvant Chemotherapy   ddAC every 2 weeks X4, followed by weekly Taxol X12    09/23/2014 Procedure   Genetic testing: Negative. Genes evaluated: ATM, BARD1, BRCA1, BRCA2, BRIP1, CDH1, CHEK2, EPCAM, FANCC, MLH1, MSH2, MSH6,  NBN, PALB2, PMS2, PTEN, RAD51C, RAD51D, TP53, & XRCC2.    03/15/2015 Surgery   left lumpectomy and axillary node dissection Marlou Starks)   03/15/2015 Pathology Results   left lumpectomy showed G2 invasive ductal carcinoma, 2cm, G2, and DCIS, G2. the  lateral margin was positive for invasive tumor, 1 out of 15 nodes was positive.     ypT1c,ypN1a: Stage IIA    04/21/2015 Surgery   Re-excision for positive margin. Surgical path was negative for malignancy.   05/17/2015 - 06/28/2015 Radiation Therapy   adjuvant breast radiation Isidore Moos). Left breast treated to 50 Gy in 25 fractions.  Left SCLV and PAB treated to 45 Gy in 25 fractions.  Left breast boost treated to 10 Gy in 5 fractions    07/12/2015 -  Anti-estrogen oral therapy   Letrozole 2.5 mg once daily. Planned duration of therapy: 10 years.    08/19/2015 Imaging   DEXA scan: Normal. (T-score -1.0)   08/24/2015 Mammogram   Diagnostic mammogram: No evidence for malignancy.    09/18/2016 Mammogram   IMPRESSION: Stable lumpectomy changes on the left. No evidence of malignancy bilaterally.   09/20/2017 Mammogram   09/20/2017 Mammogram IMPRESSION: Stable left breast lumpectomy site. No mammographic evidence of malignancy in the bilateral breasts.   09/26/2018 Mammogram   FINDINGS: There are lumpectomy changes in the lower inner left breast. Surgical changes in the inferior left axilla. No suspicious mass, suspicious microcalcification, or architectural distortion is identified to suggest malignancy in either breast. IMPRESSION: No evidence of malignancy in either breast.      INTERVAL HISTORY:  Mary Randall is here for a follow up of breast cancer. She was last seen by me a year ago and by NP Lacie 6 months ago. She presents to the clinic alone.  She is clinically doing well, denies any new pain or other complaints.  She has good appetite, weight is stable, energy level is normal.  She is functioning well at home.  She is tolerating letrozole well, except for hair loss, especially in the top of her head.  She wants to know if she can stop letrozole.   All other systems were reviewed with the patient and are negative.  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   Family history of breast cancer    Family history of breast cancer in female    Family history of pancreatic cancer    Hx of radiation therapy 05/17/15- 06/28/15   Left Breast   Hypertension    NSTEMI (non-ST elevated myocardial infarction) ([place]) 03/16/2014   non-obs CAD, spasm seen   Personal history of chemotherapy    Personal history of radiation therapy    Pneumonia 2009    SURGICAL HISTORY: Past Surgical History:  Procedure Laterality Date   BREAST BIOPSY Left 08/06/14   BREAST LUMPECTOMY Left 2017   chemo and radiation   BREAST LUMPECTOMY WITH NEEDLE LOCALIZATION AND AXILLARY LYMPH NODE DISSECTION Left 03/15/2015   Procedure: LEFT BREAST LUMPECTOMY WITH BRACKETED NEEDLE LOCALIZATION AND AXILLARY LYMPH NODE DISSECTION;  Surgeon: Autumn Messing III, MD;  Location: [facility];  Service: General;  Laterality: Left;   BUNIONECTOMY Bilateral    CARDIAC CATHETERIZATION  2010; 03/16/2014   ECTOPIC PREGNANCY SURGERY  1991   Tubal pregnancy  LEFT HEART CATHETERIZATION WITH CORONARY ANGIOGRAM N/A 03/16/2014   Procedure: LEFT HEART CATHETERIZATION WITH CORONARY ANGIOGRAM;  Surgeon: Michael Cooper, MD; Non-obs CAD, EF 55%   PORT-A-CATH REMOVAL Right 03/15/2015   Procedure: REMOVAL PORT-A-CATH;  Surgeon: Paul Toth III, MD;  Location: [facility];  Service: General;  Laterality: Right;   PORTACATH PLACEMENT N/A 08/28/2014   Procedure: INSERTION PORT-A-CATH;  Surgeon: Paul Toth III, MD;  Location: [facility];  Service: General;  Laterality: N/A;   RE-EXCISION OF BREAST CANCER,SUPERIOR MARGINS Left 04/21/2015   Procedure: RE-EXCISION OF BREAST CANCER, INFERIOR MARGIN;  Surgeon: Paul Toth III, MD;  Location: [facility];  Service: General;  Laterality: Left;   TUBAL LIGATION  1981    I have reviewed the social history and family history with the  patient and they are unchanged from previous note.  ALLERGIES:  is allergic to carvedilol, codeine, percocet [oxycodone-acetaminophen], adhesive [tape], and compazine [prochlorperazine].  MEDICATIONS:  Current Outpatient Medications  Medication Sig Dispense Refill   amLODipine (NORVASC) 5 MG tablet Take 1 tablet (5 mg total) by mouth daily. 90 tablet 3   atorvastatin (LIPITOR) 40 MG tablet TAKE 1 TABLET BY MOUTH EVERY DAY AT 6 PM 90 tablet 3   hydrochlorothiazide (HYDRODIURIL) 25 MG tablet TAKE 1 TABLET BY MOUTH ONCE DAILY 90 tablet 3   irbesartan (AVAPRO) 150 MG tablet Take 1 tablet (150 mg total) by mouth daily. 90 tablet 3   isosorbide mononitrate (IMDUR) 30 MG 24 hr tablet Take 1 tablet (30 mg total) by mouth daily. 90 tablet 3   letrozole (FEMARA) 2.5 MG tablet TAKE 1 TABLET(2.5 MG) BY MOUTH DAILY 90 tablet 3   nitroGLYCERIN (NITROSTAT) 0.4 MG SL tablet Place 1 tablet (0.4 mg total) under the tongue every 5 (five) minutes as needed for chest pain. 25 tablet 12   potassium chloride (KLOR-CON) 10 MEQ tablet Take 1 tablet (10 mEq total) by mouth every evening. 90 tablet 1   No current facility-administered medications for this visit.    PHYSICAL EXAMINATION: ECOG PERFORMANCE STATUS: 0 - Asymptomatic  Vitals:   11/12/20 0818  BP: (!) 146/101  Pulse: 61  Resp: 17  Temp: 98.3 F (36.8 C)  SpO2: 99%   Wt Readings from Last 3 Encounters:  11/12/20 165 lb (74.8 kg)  05/11/20 165 lb (74.8 kg)  11/20/19 164 lb 14.4 oz (74.8 kg)     GENERAL:alert, no distress and comfortable SKIN: skin color, texture, turgor are normal, no rashes or significant lesions, moderate hair loss, especially in the center of the scalp. EYES: normal, Conjunctiva are pink and non-injected, sclera clear NECK: supple, thyroid normal size, non-tender, without nodularity LYMPH:  no palpable lymphadenopathy in the cervical, axillary  LUNGS: clear to auscultation and percussion with normal breathing effort HEART:  regular rate & rhythm and no murmurs and no lower extremity edema ABDOMEN:abdomen soft, non-tender and normal bowel sounds Musculoskeletal:no cyanosis of digits and no clubbing  NEURO: alert & oriented x 3 with fluent speech, no focal motor/sensory deficits BREAST: No significant scar tissue in the previous incisions.  No palpable mass, nodules or adenopathy bilaterally. Breast exam benign.   LABORATORY DATA:  I have reviewed the data as listed CBC Latest Ref Rng & Units 11/12/2020 05/11/2020 11/20/2019  WBC 4.0 - 10.5 K/uL 4.0 3.4(L) 4.2  Hemoglobin 12.0 - 15.0 g/dL 11.8(L) 11.9(L) 12.2  Hematocrit 36.0 - 46.0 % 37.1 37.8 38.1  Platelets 150 - 400 K/uL 275 182 199     CMP Latest Ref   Rng & Units 11/12/2020 05/11/2020 11/20/2019  Glucose 70 - 99 mg/dL 100(H) 101(H) 96  BUN 8 - 23 mg/dL 12 14 14  Creatinine 0.44 - 1.00 mg/dL 0.97 0.99 1.00  Sodium 135 - 145 mmol/L 143 141 142  Potassium 3.5 - 5.1 mmol/L 4.0 3.6 3.7  Chloride 98 - 111 mmol/L 108 105 107  CO2 22 - 32 mmol/L 26 27 26  Calcium 8.9 - 10.3 mg/dL 10.5(H) 10.0 10.2  Total Protein 6.5 - 8.1 g/dL 6.9 6.6 6.8  Total Bilirubin 0.3 - 1.2 mg/dL 0.6 0.5 0.5  Alkaline Phos 38 - 126 U/L 74 77 79  AST 15 - 41 U/L 15 16 15  ALT 0 - 44 U/L 11 10 12      RADIOGRAPHIC STUDIES: I have personally reviewed the radiological images as listed and agreed with the findings in the report. No results found.    Orders Placed This Encounter  Procedures   MM Digital Screening    Standing Status:   Future    Standing Expiration Date:   11/12/2021    Order Specific Question:   Reason for Exam (SYMPTOM  OR DIAGNOSIS REQUIRED)    Answer:   screening    Order Specific Question:   Preferred imaging location?    Answer:   [facility]   All questions were answered. The patient knows to call the clinic with any problems, questions or concerns. No barriers to learning was detected. The total time spent in the appointment was 30 minutes.      Yan Feng, MD 11/12/2020   I, Katie Daubenspeck, am acting as scribe for Yan Feng, MD.   I have reviewed the above documentation for accuracy and completeness, and I agree with the above.     

## 2020-11-29 ENCOUNTER — Encounter: Payer: Self-pay | Admitting: Physician Assistant

## 2020-11-29 ENCOUNTER — Other Ambulatory Visit: Payer: Self-pay

## 2020-11-29 ENCOUNTER — Ambulatory Visit: Payer: Federal, State, Local not specified - PPO | Admitting: Physician Assistant

## 2020-11-29 VITALS — BP 150/100 | HR 68 | Ht 66.0 in | Wt 170.0 lb

## 2020-11-29 DIAGNOSIS — E785 Hyperlipidemia, unspecified: Secondary | ICD-10-CM | POA: Diagnosis not present

## 2020-11-29 DIAGNOSIS — I201 Angina pectoris with documented spasm: Secondary | ICD-10-CM

## 2020-11-29 DIAGNOSIS — I10 Essential (primary) hypertension: Secondary | ICD-10-CM

## 2020-11-29 NOTE — Patient Instructions (Addendum)
Medication Instructions:  Your physician recommends that you continue on your current medications as directed. Please refer to the Current Medication list given to you today.  *If you need a refill on your cardiac medications before your next appointment, please call your pharmacy*   Lab Work: None ordered today but would like for you to get a LIPID panel done at your PCP or Oncology office.  If you have labs (blood work) drawn today and your tests are completely normal, you will receive your results only by: [place] (if you have MyChart) OR A paper copy in the mail If you have any lab test that is abnormal or we need to change your treatment, we will call you to review the results.   Testing/Procedures: None ordered   Follow-Up: At [facility], you and your health needs are our priority.  As part of our continuing mission to provide you with exceptional heart care, we have created designated Provider Care Teams.  These Care Teams include your primary Cardiologist (physician) and Advanced Practice Providers (APPs -  Physician Assistants and Nurse Practitioners) who all work together to provide you with the care you need, when you need it.  We recommend signing up for the patient portal called "MyChart".  Sign up information is provided on this After Visit Summary.  MyChart is used to connect with patients for Virtual Visits (Telemedicine).  Patients are able to view lab/test results, encounter notes, upcoming appointments, etc.  Non-urgent messages can be sent to your provider as well.   To learn more about what you can do with MyChart, go to [email or web address].    Your next appointment:   12 month(s)  The format for your next appointment:   In Person  Provider:   Sherren Mocha, MD  or Robbie Lis, PA-C or Nicholes Rough, [place]         Other Instructions Get a blood pressure cuff.  Monitor your blood pressure at the same time each day for 2 weeks, keep a log and call  us with those readings.

## 2020-11-29 NOTE — Progress Notes (Signed)
Cardiology Office Note:    Date:  11/29/2020   ID:  Mary Randall, DOB 1955/01/28, MRN [id number]  PCP:  Mary Nip, MD  [facility] Cardiologist:  Sherren Mocha, MD  [place] Electrophysiologist:  None   Chief Complaint: 12 months  History of Present Illness:    Mary Randall is a 65 y.o. female with a hx of coronary vasospasm/Prinzmental angina, HTN, HLD and breast cancer presents for follow up.    Presented in 2016 with chest pain and significant EKG changes. Cath showed diffuse non obstructive CAD. LV function was normal. Treated with Isosorbide and amlodipine.   Here today for follow up. She has not had any chest pain and remains active working full time. She does not take her BP at home and she was encouraged to do today so we can make medication changes as indicated. She was recently seen by er oncologist for breast cancer and labs were drawn at that time. Her LFTs were normal. She will need a lipid panel since we have not drawn one since 2016. She plans to do this when she either sees her PCP or when she gets labs through her oncologist.  Reports no shortness of breath nor dyspnea on exertion. Reports no chest pain, pressure, or tightness. No edema, orthopnea, PND. Reports no palpitations.    Past Medical History:  Diagnosis Date   Angina effort    none recent   Arthritis    "right knee" (03/16/2015)   Breast cancer, left ([place]) dx'd 2016   Coronary vasospasm ([place]) 2010; 03/16/2014   Prinz-Metal's angina   Family history of breast cancer    Family history of breast cancer in female    Family history of pancreatic cancer    Hx of radiation therapy 05/17/15- 06/28/15   Left Breast   Hypertension    NSTEMI (non-ST elevated myocardial infarction) ([place]) 03/16/2014   non-obs CAD, spasm seen   Personal history of chemotherapy    Personal history of radiation therapy    Pneumonia 2009    Past Surgical History:  Procedure Laterality Date   BREAST BIOPSY Left  08/06/14   BREAST LUMPECTOMY Left 2017   chemo and radiation   BREAST LUMPECTOMY WITH NEEDLE LOCALIZATION AND AXILLARY LYMPH NODE DISSECTION Left 03/15/2015   Procedure: LEFT BREAST LUMPECTOMY WITH BRACKETED NEEDLE LOCALIZATION AND AXILLARY LYMPH NODE DISSECTION;  Surgeon: Autumn Messing III, MD;  Location: [place];  Service: General;  Laterality: Left;   BUNIONECTOMY Bilateral    CARDIAC CATHETERIZATION  2010; 03/16/2014   ECTOPIC PREGNANCY SURGERY  1991   Tubal pregnancy   LEFT HEART CATHETERIZATION WITH CORONARY ANGIOGRAM N/A 03/16/2014   Procedure: LEFT HEART CATHETERIZATION WITH CORONARY ANGIOGRAM;  Surgeon: Sherren Mocha, MD; Non-obs CAD, EF 55%   PORT-A-CATH REMOVAL Right 03/15/2015   Procedure: REMOVAL PORT-A-CATH;  Surgeon: Autumn Messing III, MD;  Location: [place];  Service: General;  Laterality: Right;   PORTACATH PLACEMENT N/A 08/28/2014   Procedure: INSERTION PORT-A-CATH;  Surgeon: Autumn Messing III, MD;  Location: [place];  Service: General;  Laterality: N/A;   RE-EXCISION OF BREAST CANCER,SUPERIOR MARGINS Left 04/21/2015   Procedure: RE-EXCISION OF BREAST CANCER, INFERIOR MARGIN;  Surgeon: Autumn Messing III, MD;  Location: [facility];  Service: General;  Laterality: Left;   TUBAL LIGATION  1981    Current Medications: Current Meds  Medication Sig   amLODipine (NORVASC) 5 MG tablet Take 1 tablet (5 mg total) by mouth daily.   atorvastatin (LIPITOR) 40 MG  tablet TAKE 1 TABLET BY MOUTH EVERY DAY AT 6 PM   hydrochlorothiazide (HYDRODIURIL) 25 MG tablet TAKE 1 TABLET BY MOUTH ONCE DAILY   irbesartan (AVAPRO) 150 MG tablet Take 1 tablet (150 mg total) by mouth daily.   isosorbide mononitrate (IMDUR) 30 MG 24 hr tablet Take 1 tablet (30 mg total) by mouth daily.   letrozole (FEMARA) 2.5 MG tablet TAKE 1 TABLET(2.5 MG) BY MOUTH DAILY   nitroGLYCERIN (NITROSTAT) 0.4 MG SL tablet Place 1 tablet (0.4 mg total) under the tongue every 5 (five) minutes as needed for chest pain.   potassium chloride (KLOR-CON) 10 MEQ  tablet Take 1 tablet (10 mEq total) by mouth every evening.     Allergies:   Carvedilol, Codeine, Percocet [oxycodone-acetaminophen], Adhesive [tape], and Compazine [prochlorperazine]   Social History   Socioeconomic History   Marital status: Legally Separated    Spouse name: Not on file   Number of children: Not on file   Years of education: Not on file   Highest education level: Not on file  Occupational History   Not on file  Tobacco Use   Smoking status: Never   Smokeless tobacco: Never  Vaping Use   Vaping Use: Never used  Substance and Sexual Activity   Alcohol use: No   Drug use: No   Sexual activity: Never  Other Topics Concern   Not on file  Social History Narrative   Not on file   Social Determinants of Health   Financial Resource Strain: Not on file  Food Insecurity: Not on file  Transportation Needs: Not on file  Physical Activity: Not on file  Stress: Not on file  Social Connections: Not on file     Family History: The patient's family history includes Breast cancer in her cousin; Breast cancer (age of onset: 34) in her brother; CVA in her father; Colon cancer (age of onset: 44) in her mother; Diabetes type II in her mother; Healthy in her sister and sister; Hypertension in her mother; Pancreatic cancer (age of onset: 14) in her brother.    ROS:   Please see the history of present illness.    All other systems reviewed and are negative.   EKGs/Labs/Other Studies Reviewed:    The following studies were reviewed today:  Echocardiogram: 08/2014 - Left ventricle: The cavity size was normal. Wall thickness was    normal. Systolic function was normal. The estimated ejection    fraction was in the range of 55% to 60%.  - Left atrium: The atrium was mildly dilated.    Cardiac Catheterization: 03/2014 Coronary angiography: Coronary dominance: right   Left mainstem: The left main stem is widely patent. The vessel has minor irregularity and divides into the  LAD and left circumflex. There is no stenosis.   Left anterior descending (LAD): The LAD has very minimal stenosis at the ostium. The diagonal branches are patent. The first diagonal has minor irregularity. The second diagonal has 50% ostial stenosis. The proximal LAD has no significant disease. The mid LAD after the second diagonal has diffuse 40-50% stenosis. The LAD wraps around the inferoapex.   Left circumflex (LCx): The left circumflex is large in caliber. The vessel has no significant obstruction through the proximal and mid segments. There is minor 20% stenosis in the mid vessel. The obtuse marginal branches are patent with mild nonobstructive disease noted.   Right coronary artery (RCA): This is a large, dominant vessel. The PDA and PLA branches are patent. There is  no stenosis throughout the RCA distribution.   Left ventriculography: Left ventricular systolic function is normal, LVEF is estimated at 60-65%, there is no significant mitral regurgitation    Contrast: 85 cc Omnipaque   Estimated Blood Loss: minimal   Final Conclusions:   1. 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 Normal LV systolic function with normal LVEDP   Recommendations: Somewhat unclear as to the etiology of this patient's fairly profound symptoms and EKG changes. 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 The patient had previously been on long-acting nitrates and ran out of medication recently. It is also possible that she has some sort of GI pathology but difficult to explain her EKG changes. We'll start back on long-acting nitrate therapy. The patient should have serial cardiac enzymes and overnight observation in the hospital.    EKG:  EKG is  ordered today.  The ekg ordered today demonstrates NSR, rate 68 bpm  Recent Labs: 11/12/2020: ALT 11; BUN 12; Creatinine 0.97; Hemoglobin 11.8; Platelet Count 275; Potassium 4.0; Sodium 143  Recent Lipid  Panel    Component Value Date/Time   CHOL 144 03/17/2014 0402   TRIG 136 03/17/2014 0402   HDL 39 (L) 03/17/2014 0402   CHOLHDL 3.7 03/17/2014 0402   VLDL 27 03/17/2014 0402   LDLCALC 78 03/17/2014 0402      Physical Exam:    VS:  BP (!) 150/100   Pulse 68   Ht 5\' 6"  (1.676 m)   Wt 170 lb (77.1 kg)   SpO2 97%   BMI 27.44 kg/m     Wt Readings from Last 3 Encounters:  11/29/20 170 lb (77.1 kg)  11/12/20 165 lb (74.8 kg)  05/11/20 165 lb (74.8 kg)     GEN:  Well nourished, well developed in no acute distress HEENT: Normal NECK: No JVD; No carotid bruits LYMPHATICS: No lymphadenopathy CARDIAC: RRR, no murmurs, rubs, gallops RESPIRATORY:  Clear to auscultation without rales, wheezing or rhonchi  ABDOMEN: Soft, non-tender, non-distended MUSCULOSKELETAL:  No edema; No deformity  SKIN: Warm and dry NEUROLOGIC:  Alert and oriented x 3 PSYCHIATRIC:  Normal affect   ASSESSMENT AND PLAN:    Prizmetal angina - mild non obstructive CAD by cath in 2016. No anginal symptoms. Continue current medical therapy.   2. HTN -She is hypertensive today. She does not take her BP at home. I suggested taking her BP daily at the same time and recording the readings. She is to call in 2 weeks with the BP readings and we can decide to change her medications at that time if indicated. BP was 150/100 today. She remains on Norvasc, HCTZ, and Imdur.    3. HLD -she gets labs drawn once a year for oncology. She will get a Lipid panel and send to [place] from PCP. Her LFTS were just done and were normal. Continue Lipitor.   Medication Adjustments/Labs and Tests Ordered: Current medicines are reviewed at length with the patient today.  Concerns regarding medicines are outlined above.  Orders Placed This Encounter  Procedures   EKG 12-Lead   No orders of the defined types were placed in this encounter.   Patient Instructions  Medication Instructions:  Your physician recommends that you continue  on your current medications as directed. Please refer to the Current Medication list given to you today.  *If you need a refill on your cardiac medications before your next appointment, please call your pharmacy*   Lab Work:  None ordered today but would like for you to get a LIPID panel done at your PCP or Oncology office.  If you have labs (blood work) drawn today and your tests are completely normal, you will receive your results only by: [place] (if you have MyChart) OR A paper copy in the mail If you have any lab test that is abnormal or we need to change your treatment, we will call you to review the results.   Testing/Procedures: None ordered   Follow-Up: At [facility], you and your health needs are our priority.  As part of our continuing mission to provide you with exceptional heart care, we have created designated Provider Care Teams.  These Care Teams include your primary Cardiologist (physician) and Advanced Practice Providers (APPs -  Physician Assistants and Nurse Practitioners) who all work together to provide you with the care you need, when you need it.  We recommend signing up for the patient portal called "MyChart".  Sign up information is provided on this After Visit Summary.  MyChart is used to connect with patients for Virtual Visits (Telemedicine).  Patients are able to view lab/test results, encounter notes, upcoming appointments, etc.  Non-urgent messages can be sent to your provider as well.   To learn more about what you can do with MyChart, go to [email or web address].    Your next appointment:   12 month(s)  The format for your next appointment:   In Person  Provider:   Sherren Mocha, MD  or Robbie Lis, PA-C or Nicholes Rough, [place]         Other Instructions Get a blood pressure cuff.  Monitor your blood pressure at the same time each day for 2 weeks, keep a log and call us with those readings.     Signed, Elgie Collard, PA-C  11/29/2020  9:11 AM    [place]

## 2021-01-26 DIAGNOSIS — Z853 Personal history of malignant neoplasm of breast: Secondary | ICD-10-CM | POA: Diagnosis not present

## 2021-01-26 DIAGNOSIS — D531 Other megaloblastic anemias, not elsewhere classified: Secondary | ICD-10-CM | POA: Diagnosis not present

## 2021-01-26 DIAGNOSIS — I10 Essential (primary) hypertension: Secondary | ICD-10-CM | POA: Diagnosis not present

## 2021-01-26 DIAGNOSIS — E7800 Pure hypercholesterolemia, unspecified: Secondary | ICD-10-CM | POA: Diagnosis not present

## 2021-01-27 ENCOUNTER — Other Ambulatory Visit: Payer: Self-pay | Admitting: Cardiovascular Disease

## 2021-02-16 ENCOUNTER — Other Ambulatory Visit: Payer: Self-pay | Admitting: Hematology

## 2021-05-19 DIAGNOSIS — R030 Elevated blood-pressure reading, without diagnosis of hypertension: Secondary | ICD-10-CM | POA: Diagnosis not present

## 2021-05-19 DIAGNOSIS — L255 Unspecified contact dermatitis due to plants, except food: Secondary | ICD-10-CM | POA: Diagnosis not present

## 2021-05-27 ENCOUNTER — Other Ambulatory Visit: Payer: Self-pay | Admitting: Hematology

## 2021-05-27 ENCOUNTER — Other Ambulatory Visit: Payer: Self-pay | Admitting: Cardiovascular Disease

## 2021-05-27 DIAGNOSIS — Z170 Estrogen receptor positive status [ER+]: Secondary | ICD-10-CM

## 2021-06-09 DIAGNOSIS — R21 Rash and other nonspecific skin eruption: Secondary | ICD-10-CM | POA: Diagnosis not present

## 2021-06-23 ENCOUNTER — Telehealth: Payer: Self-pay | Admitting: Physician Assistant

## 2021-06-23 ENCOUNTER — Other Ambulatory Visit: Payer: Self-pay | Admitting: Cardiovascular Disease

## 2021-06-23 NOTE — Telephone Encounter (Signed)
Follow Up:     Patient wants to know if she is still supposed to be taking Atorvastatin? She said her prescription was denied.   Pt c/o medication issue:  1. Name of Medication: Atorvastatin  2. How are you currently taking this medication (dosage and times per day)? 1 daily  3. Are you having a reaction (difficulty breathing--STAT)?   4. What is your medication issue? Should she still be taking it? If so, please forward to refill pool

## 2021-06-23 NOTE — Telephone Encounter (Signed)
Looks like medication was refilled today already by our refill team. Informed patient and she verbalized understanding.

## 2021-10-20 ENCOUNTER — Ambulatory Visit
Admission: RE | Admit: 2021-10-20 | Discharge: 2021-10-20 | Disposition: A | Discharge status: Home / Self Care | Payer: Federal, State, Local not specified - PPO | Source: Ambulatory Visit | Attending: Hematology | Admitting: Hematology

## 2021-10-20 DIAGNOSIS — Z1231 Encounter for screening mammogram for malignant neoplasm of breast: Secondary | ICD-10-CM

## 2021-11-09 ENCOUNTER — Other Ambulatory Visit: Payer: Self-pay

## 2021-11-09 ENCOUNTER — Other Ambulatory Visit: Payer: Self-pay | Admitting: Cardiovascular Disease

## 2021-11-09 ENCOUNTER — Other Ambulatory Visit: Payer: Self-pay | Admitting: Nurse Practitioner

## 2021-11-09 DIAGNOSIS — Z170 Estrogen receptor positive status [ER+]: Secondary | ICD-10-CM

## 2021-11-09 DIAGNOSIS — E538 Deficiency of other specified B group vitamins: Secondary | ICD-10-CM

## 2021-11-09 NOTE — Progress Notes (Unsigned)
[facility]   [phone] Fax:([phone]   Clinic Follow up Note   Patient Care Team: Rankins, Bill Salinas, MD as PCP - General (Family Medicine) Sherren Mocha, MD as PCP - Cardiology (Cardiology) Jovita Kussmaul, MD as Consulting Physician (General Surgery) 11/10/2021  CHIEF COMPLAINT: Follow-up left breast cancer  SUMMARY OF ONCOLOGIC HISTORY: Oncology History Overview Note  Breast cancer of upper-inner quadrant of left female breast [facility])   Staging form: Breast, AJCC 7th Edition     Clinical: Stage IIIA (T3, N1, M0) - Unsigned     Pathologic stage from 03/15/2015: Stage IIA (yT1c, N1a, cM0) - Signed by Truitt Merle, MD on 04/02/2015     Breast cancer of upper-inner quadrant of left female breast ([place])  08/06/2014 Initial Diagnosis   Breast 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   08/06/2014 Receptors her2   ER 100% positive, PR 5% positive, HER2-, Ki-67 50%   08/06/2014 Mammogram   Diagnostic mammogram and ultrasound showed left breast mass with associated calcification measuring 5.5 cm, indeterminate a  left axillary lymph node was cortical thickening, circumscribed low density oval 70m mass at 12:00 within the left breast.   09/03/2014 - 01/26/2015 Neo-Adjuvant Chemotherapy   ddAC every 2 weeks X4, followed by weekly Taxol X12    09/23/2014 Procedure   Genetic testing: Negative. Genes evaluated: ATM, BARD1, BRCA1, BRCA2, BRIP1, CDH1, CHEK2, EPCAM, FANCC, MLH1, MSH2, MSH6, NBN, PALB2, PMS2, PTEN, RAD51C, RAD51D, TP53, & XRCC2.    03/15/2015 Surgery   left lumpectomy and axillary node dissection (Marlou Starks   03/15/2015 Pathology Results   left lumpectomy showed G2 invasive ductal carcinoma, 2cm, G2, and DCIS, G2. the lateral margin was positive for invasive tumor, 1 out of  15 nodes was positive.     ypT1c,ypN1a: Stage IIA    04/21/2015 Surgery   Re-excision for positive margin. Surgical path was negative for malignancy.   05/17/2015 - 06/28/2015 Radiation Therapy   adjuvant breast radiation (Isidore Moos. Left breast treated to 50 Gy in 25 fractions.  Left SCLV and PAB treated to 45 Gy in 25 fractions.  Left breast boost treated to 10 Gy in 5 fractions    07/12/2015 -  Anti-estrogen oral therapy   Letrozole 2.5 mg once daily. Planned duration of therapy: 10 years.    08/19/2015 Imaging   DEXA scan: Normal. (T-score -1.0)   08/24/2015 Mammogram   Diagnostic mammogram: No evidence for malignancy.    09/18/2016 Mammogram   IMPRESSION: Stable lumpectomy changes on the left. No evidence of malignancy bilaterally.   09/20/2017 Mammogram   09/20/2017 Mammogram IMPRESSION: Stable left breast lumpectomy site. No mammographic evidence of malignancy in the bilateral breasts.   09/26/2018 Mammogram   FINDINGS: There are lumpectomy changes in the lower inner left breast. Surgical changes in the inferior left axilla. No suspicious mass, suspicious microcalcification, or architectural distortion is identified to suggest malignancy in either breast. IMPRESSION: No evidence of malignancy in either breast.     CURRENT THERAPY: Letrozole 2.5 mg once daily, started 07/2015; held late 2022 for hair loss; plan to complete therapy 07/2022   INTERVAL HISTORY: [name] for follow-up as scheduled, last seen by Dr. FBurr Medico11/11/2020.  She held letrozole for a month at the end of last year for hair loss, started rogain, and restarted letrozole.  She continues to have hair thinning/loss and has decided to stop letrozole when her current supply runs out around 01/2022. She denies other side effects, new pain, unintentional weight loss, or breast concerns such as new lump/mass, nipple discharge or inversion, or skin change. Mammogram 10/20/2021 was negative.  All other systems were  reviewed with the patient and are negative.  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   Family history of breast cancer    Family history of breast cancer in female    Family history of pancreatic cancer    Hx of radiation therapy 05/17/15- 06/28/15   Left Breast   Hypertension    NSTEMI (non-ST elevated myocardial infarction) ([name]) 03/16/2014   non-obs CAD, spasm seen   Personal history of chemotherapy    Personal history of radiation therapy    Pneumonia 2009    SURGICAL HISTORY: Past Surgical History:  Procedure Laterality Date   BREAST BIOPSY Left 08/06/14   BREAST LUMPECTOMY Left 2017   chemo and radiation   BREAST LUMPECTOMY WITH NEEDLE LOCALIZATION AND AXILLARY LYMPH NODE DISSECTION Left 03/15/2015   Procedure: LEFT BREAST LUMPECTOMY WITH BRACKETED NEEDLE LOCALIZATION AND AXILLARY LYMPH NODE DISSECTION;  Surgeon: Autumn Messing III, MD;  Location: [place];  Service: General;  Laterality: Left;   BUNIONECTOMY Bilateral    CARDIAC CATHETERIZATION  2010; 03/16/2014   ECTOPIC PREGNANCY SURGERY  1991   Tubal pregnancy   LEFT HEART CATHETERIZATION WITH CORONARY ANGIOGRAM N/A 03/16/2014   Procedure: LEFT HEART CATHETERIZATION WITH CORONARY ANGIOGRAM;  Surgeon: Sherren Mocha, MD; Non-obs CAD, EF 55%   PORT-A-CATH REMOVAL Right 03/15/2015   Procedure: REMOVAL PORT-A-CATH;  Surgeon: Autumn Messing III, MD;  Location: [place];  Service: General;  Laterality: Right;   PORTACATH PLACEMENT N/A 08/28/2014   Procedure: INSERTION PORT-A-CATH;  Surgeon: Autumn Messing III, MD;  Location: [place];  Service: General;  Laterality: N/A;   RE-EXCISION OF BREAST CANCER,SUPERIOR MARGINS Left 04/21/2015   Procedure: RE-EXCISION OF BREAST CANCER, INFERIOR MARGIN;  Surgeon: Autumn Messing III, MD;  Location: [facility];  Service: General;   Laterality: Left;   [place]    I have reviewed the social history and family history with the patient and they are unchanged from previous note.  ALLERGIES:  is allergic to carvedilol, codeine, percocet [oxycodone-acetaminophen], adhesive [tape], and compazine [prochlorperazine].  MEDICATIONS:  Current Outpatient Medications  Medication Sig Dispense Refill   amLODipine (NORVASC) 5 MG tablet TAKE 1 TABLET(5 MG) BY MOUTH DAILY 90 tablet 3   atorvastatin (LIPITOR) 40 MG tablet TAKE 1 TABLET BY MOUTH EVERY DAY AT 6 PM 90 tablet 0   DODEX 1000 MCG/ML injection ADMINISTER 1 ML(1000 MCG) IN THE MUSCLE 1 TIME FOR 1 DOSE 1 mL 11   hydrochlorothiazide (HYDRODIURIL) 25 MG tablet TAKE 1 TABLET BY MOUTH EVERY DAY 90 tablet 1   irbesartan (AVAPRO) 150 MG tablet TAKE 1 TABLET(150 MG) BY MOUTH DAILY 90 tablet 3   isosorbide mononitrate (IMDUR) 30 MG 24 hr tablet TAKE 1 TABLET(30 MG) BY MOUTH DAILY 90 tablet 3   letrozole (FEMARA) 2.5 MG tablet TAKE 1 TABLET(2.5 MG) BY MOUTH DAILY 90 tablet 3   nitroGLYCERIN (NITROSTAT) 0.4 MG SL tablet Place 1 tablet (0.4 mg total) under the tongue every 5 (  five) minutes as needed for chest pain. 25 tablet 12   potassium chloride (KLOR-CON) 10 MEQ tablet TAKE 1 TABLET(10 MEQ) BY MOUTH EVERY EVENING 90 tablet 1   No current facility-administered medications for this visit.    PHYSICAL EXAMINATION: ECOG PERFORMANCE STATUS: 0 - Asymptomatic  Vitals:   11/10/21 0935 11/10/21 0948  BP: (!) 136/95 120/88  Pulse: 73   Resp: 18   Temp: 98.3 F (36.8 C)   SpO2: 100%    Filed Weights   11/10/21 0935  Weight: 169 lb 11.2 oz (77 kg)    GENERAL:alert, no distress and comfortable SKIN: No rash EYES: sclera clear NECK: Without mass  LYMPH:  no palpable cervical or supraclavicular lymphadenopathy  LUNGS: normal breathing effort HEART: no lower extremity edema ABDOMEN:abdomen soft, non-tender  Musculoskeletal: No focal spinal tenderness NEURO: alert &  oriented x 3 with fluent speech Breast exam: Symmetrical without nipple discharge or inversion.  S/p left lumpectomy, incisions completely healed.  No palpable mass or nodularity in either breast or axilla that I could appreciate.  LABORATORY DATA:  I have reviewed the data as listed    Latest Ref Rng & Units 11/10/2021    9:21 AM 11/12/2020    7:59 AM 05/11/2020    9:15 AM  CBC  WBC 4.0 - 10.5 K/uL 3.3  4.0  3.4   Hemoglobin 12.0 - 15.0 g/dL 12.4  11.8  11.9   Hematocrit 36.0 - 46.0 % 37.5  37.1  37.8   Platelets 150 - 400 K/uL 200  275  182         Latest Ref Rng & Units 11/10/2021    9:21 AM 11/12/2020    7:59 AM 05/11/2020    9:15 AM  CMP  Glucose 70 - 99 mg/dL 120  100  101   BUN 8 - 23 mg/dL _0 Creatinine 0.44 - 1.00 mg/dL 0.99  0.97  0.99   Sodium 135 - 145 mmol/L 141  143  141   Potassium 3.5 - 5.1 mmol/L 3.8  4.0  3.6   Chloride 98 - 111 mmol/L 105  108  105   CO2 22 - 32 mmol/L _1 Calcium 8.9 - 10.3 mg/dL 10.4  10.5  10.0   Total Protein 6.5 - 8.1 g/dL 6.8  6.9  6.6   Total Bilirubin 0.3 - 1.2 mg/dL 0.8  0.6  0.5   Alkaline Phos 38 - 126 U/L 70  74  77   AST 15 - 41 U/L _2 ALT 0 - 44 U/L _3 RADIOGRAPHIC STUDIES: I have personally reviewed the radiological images as listed and agreed with the findings in the report. No results found.   ASSESSMENT & PLAN: PETA PEACHEY is a 66 y.o. female with      1. Left breast invasive ductal carcinoma, cT3N1M0, stage IIIA, ER+/PR+/HER2-, ypT1cN1aM0 after neoadjuvant chemotherapy -Diagnosed in 08/2014, S/p neoadjuvant chemo ddAC-T, left breast lumpectomy and axillary re-excision surgeries and adjuvant Radiation.  -She started antiestrogen therapy with Letrozole since 07/2015, tolerating well without side effects except hair loss. She has considered stopping AI because of this for over a year, but has agreed to continue.  However due to the negative impact on her quality of life she plans  to stop letrozole when her current supply runs out in 01/2022.  She will  have completed nearly 7 years at that time -She will proceed with surveillance alone at that time and continue annual follow-up -Ms. Schermer is clinically doing well.  Exam is benign, CBC is stable.  CMP and B12 are pending.  Recent mammogram 10/20/2021 is negative.  Overall no clinical concern for breast cancer recurrence. -Continue surveillance and letrozole until the supply runs out -Follow-up in 1 year, or sooner if needed   2. Osteopenia  -Her 08/1015 DEXA was normal with lowest T score -1.0. She became osteopenic with lowest t-score on -1.3 at forearm on 10/2017. -She has hypercalcemia, not on calcium. She walks frequently at work.  I again recommend to start vitamin D -DEXA scheduled 05/12/20 was stable, improved at the forearm.  -Repeat DEXA 05/2022   3. Cancer Testing was negative for pathogenetic mutations.    4. History of non-STEMI in 03/2014 -She had cardiac catheterization, which showed mild diffuse coronary artery disease, but no need intervention.  -Follow-up cardiology    5. Hypercalcemia -Her calcium has been around 11 and remained elevated.  -Her vitamin D level is low, PTH was normal. -Ca stable 10-11 range, today's level is pending -Follow-up PCP   6.  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 She continues monthly B12 injection at home -Hgb 10-12 since 2021 -continue monthly B12 indefinitely -Today's level is pending   Plan: -Recent mammogram and today's labs reviewed -Continue breast cancer surveillance -She plans to stop letrozole when the current supply runs out ~01/2022 -DEXA in 05/2022, ordered, will call with results -Follow-up in 1 year, or sooner if needed   Orders Placed This Encounter  Procedures   DG Bone Density    Standing Status:    Future    Standing Expiration Date:   11/10/2022    Order Specific Question:   Reason for Exam (SYMPTOM  OR DIAGNOSIS REQUIRED)    Answer:   osteopenia, on AI    Order Specific Question:   Preferred imaging location?    Answer:   [facility]   All questions were answered. The patient knows to call the clinic with any problems, questions or concerns. No barriers to learning were detected.      Alla Feeling, NP 11/10/21

## 2021-11-10 ENCOUNTER — Inpatient Hospital Stay (HOSPITAL_BASED_OUTPATIENT_CLINIC_OR_DEPARTMENT_OTHER): Payer: Federal, State, Local not specified - PPO | Admitting: Nurse Practitioner

## 2021-11-10 ENCOUNTER — Other Ambulatory Visit: Payer: Self-pay

## 2021-11-10 ENCOUNTER — Inpatient Hospital Stay: Payer: Federal, State, Local not specified - PPO | Attending: Nurse Practitioner

## 2021-11-10 ENCOUNTER — Encounter: Payer: Self-pay | Admitting: Nurse Practitioner

## 2021-11-10 VITALS — BP 120/88 | HR 73 | Temp 98.3°F | Resp 18 | Wt 169.7 lb

## 2021-11-10 DIAGNOSIS — Z170 Estrogen receptor positive status [ER+]: Secondary | ICD-10-CM | POA: Diagnosis not present

## 2021-11-10 DIAGNOSIS — C773 Secondary and unspecified malignant neoplasm of axilla and upper limb lymph nodes: Secondary | ICD-10-CM | POA: Diagnosis not present

## 2021-11-10 DIAGNOSIS — Z79899 Other long term (current) drug therapy: Secondary | ICD-10-CM | POA: Diagnosis not present

## 2021-11-10 DIAGNOSIS — C50212 Malignant neoplasm of upper-inner quadrant of left female breast: Secondary | ICD-10-CM

## 2021-11-10 DIAGNOSIS — Z79811 Long term (current) use of aromatase inhibitors: Secondary | ICD-10-CM | POA: Insufficient documentation

## 2021-11-10 DIAGNOSIS — E538 Deficiency of other specified B group vitamins: Secondary | ICD-10-CM

## 2021-11-10 LAB — CMP (CANCER CENTER ONLY)
ALT: 9 U/L (ref 0–44)
AST: 13 U/L — ABNORMAL LOW (ref 15–41)
Albumin: 4.2 g/dL (ref 3.5–5.0)
Alkaline Phosphatase: 70 U/L (ref 38–126)
Anion gap: 7 (ref 5–15)
BUN: 10 mg/dL (ref 8–23)
CO2: 29 mmol/L (ref 22–32)
Calcium: 10.4 mg/dL — ABNORMAL HIGH (ref 8.9–10.3)
Chloride: 105 mmol/L (ref 98–111)
Creatinine: 0.99 mg/dL (ref 0.44–1.00)
GFR, Estimated: >60 mL/min (ref >60)
Glucose, Bld: 120 mg/dL — ABNORMAL HIGH (ref 70–99)
Potassium: 3.8 mmol/L (ref 3.5–5.1)
Sodium: 141 mmol/L (ref 135–145)
Total Bilirubin: 0.8 mg/dL (ref 0.3–1.2)
Total Protein: 6.8 g/dL (ref 6.5–8.1)

## 2021-11-10 LAB — CBC WITH DIFFERENTIAL (CANCER CENTER ONLY)
Abs Immature Granulocytes: 0.01 10*3/uL (ref 0.00–0.07)
Basophils Absolute: 0 10*3/uL (ref 0.0–0.1)
Basophils Relative: 1 %
Eosinophils Absolute: 0.1 10*3/uL (ref 0.0–0.5)
Eosinophils Relative: 2 %
HCT: 37.5 % (ref 36.0–46.0)
Hemoglobin: 12.4 g/dL (ref 12.0–15.0)
Immature Granulocytes: 0 %
Lymphocytes Relative: 34 %
Lymphs Abs: 1.1 10*3/uL (ref 0.7–4.0)
MCH: 29.3 pg (ref 26.0–34.0)
MCHC: 33.1 g/dL (ref 30.0–36.0)
MCV: 88.7 fL (ref 80.0–100.0)
Monocytes Absolute: 0.2 10*3/uL (ref 0.1–1.0)
Monocytes Relative: 6 %
Neutro Abs: 1.9 10*3/uL (ref 1.7–7.7)
Neutrophils Relative %: 57 %
Platelet Count: 200 10*3/uL (ref 150–400)
RBC: 4.23 MIL/uL (ref 3.87–5.11)
RDW: 13.2 % (ref 11.5–15.5)
WBC Count: 3.3 10*3/uL — ABNORMAL LOW (ref 4.0–10.5)
nRBC: 0 % (ref 0.0–0.2)

## 2021-11-10 LAB — VITAMIN B12: Vitamin B-12: >7500 pg/mL — ABNORMAL HIGH (ref 180–914)

## 2021-11-11 ENCOUNTER — Other Ambulatory Visit: Payer: Self-pay | Admitting: Nurse Practitioner

## 2021-11-11 ENCOUNTER — Other Ambulatory Visit: Payer: Self-pay | Admitting: Cardiovascular Disease

## 2021-11-11 DIAGNOSIS — E538 Deficiency of other specified B group vitamins: Secondary | ICD-10-CM

## 2021-11-14 ENCOUNTER — Telehealth: Payer: Self-pay

## 2021-11-14 NOTE — Telephone Encounter (Signed)
Notified patient of results below and forwarded message to scheduling

## 2021-11-14 NOTE — Telephone Encounter (Signed)
-----   Message from Alla Feeling, NP sent at 11/11/2021 12:20 PM EST ----- Please let pt know her B12 is very high, have her skip 2 months of B12 injections and repeat lab in 3 months, then I will call her for further instructions. Please send appt scheduling message.  Thanks, Regan Rakers

## 2021-11-17 ENCOUNTER — Other Ambulatory Visit: Payer: Self-pay | Admitting: Cardiovascular Disease

## 2021-12-02 ENCOUNTER — Other Ambulatory Visit: Payer: Self-pay | Admitting: Cardiovascular Disease

## 2021-12-19 ENCOUNTER — Other Ambulatory Visit: Payer: Self-pay | Admitting: Cardiovascular Disease

## 2021-12-22 ENCOUNTER — Telehealth: Payer: Self-pay

## 2021-12-22 NOTE — Patient Outreach (Signed)
  Care Coordination   Initial Visit Note   12/22/2021 Name: TOMEKIA HELTON MRN: [id number] DOB: [birth year]  [name] is a 67 y.o. year old female who sees Kristen Loader, FNP for primary care. I spoke with  [name] by phone today.  What matters to the patients health and wellness today?  No concerns today.  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    Goals Addressed             This Visit's Progress    COMPLETED: Care Coordination Activities - no follow up required       Care Coordination Interventions: Provided education to patient re: self monitoring B/P at home, care coordination services Assessed social determinant of health barriers          SDOH assessments and interventions completed:  Yes  SDOH Interventions Today    Flowsheet Row Most Recent Value  SDOH Interventions   Food Insecurity Interventions Intervention Not Indicated  Housing Interventions Intervention Not Indicated  Transportation Interventions Intervention Not Indicated  Utilities Interventions Intervention Not Indicated  Financial Strain Interventions Intervention Not Indicated        Care Coordination Interventions:  Yes, provided   Follow up plan: No further intervention required.   Encounter Outcome:  Pt. Visit Completed  Peter Garter RN, BSN,CCM, CDE Care Management Coordinator [place] [facility] [phone]

## 2021-12-22 NOTE — Patient Instructions (Signed)
Visit Information  Thank you for taking time to visit with me today. Please don't hesitate to contact me if I can be of assistance to you.   Following are the goals we discussed today:   Goals Addressed             This Visit's Progress    COMPLETED: Care Coordination Activities - no follow up required       Care Coordination Interventions: Provided education to patient re: self monitoring B/P at home, care coordination services Assessed social determinant of health barriers          If you are experiencing a Mental Health or [place] or need someone to talk to, please call the Suicide and Crisis Lifeline: 988 call the [place] National Suicide Prevention Lifeline: 815 584 8966 or TTY: 202-728-1158 TTY 678-645-4757) to talk to a trained counselor call 1-800-273-TALK (toll free, 24 hour hotline) go to [facility] [place] [phone]) call 911   Patient verbalizes understanding of instructions and care plan provided today and agrees to view in [place]. Active MyChart status and patient understanding of how to access instructions and care plan via MyChart confirmed with patient.     No further follow up required:    Peter Garter RN, [place], [place] [facility] [phone]

## 2022-02-10 ENCOUNTER — Other Ambulatory Visit: Payer: Self-pay | Admitting: Cardiovascular Disease

## 2022-02-14 ENCOUNTER — Inpatient Hospital Stay: Payer: Federal, State, Local not specified - PPO | Attending: Nurse Practitioner

## 2022-02-14 ENCOUNTER — Telehealth: Payer: Self-pay | Admitting: Cardiovascular Disease

## 2022-02-14 DIAGNOSIS — Z8639 Personal history of other endocrine, nutritional and metabolic disease: Secondary | ICD-10-CM | POA: Insufficient documentation

## 2022-02-14 DIAGNOSIS — Z79811 Long term (current) use of aromatase inhibitors: Secondary | ICD-10-CM | POA: Diagnosis not present

## 2022-02-14 DIAGNOSIS — C50212 Malignant neoplasm of upper-inner quadrant of left female breast: Secondary | ICD-10-CM | POA: Insufficient documentation

## 2022-02-14 DIAGNOSIS — Z170 Estrogen receptor positive status [ER+]: Secondary | ICD-10-CM | POA: Diagnosis not present

## 2022-02-14 DIAGNOSIS — E538 Deficiency of other specified B group vitamins: Secondary | ICD-10-CM

## 2022-02-14 LAB — CBC WITH DIFFERENTIAL (CANCER CENTER ONLY)
Abs Immature Granulocytes: 0.01 10*3/uL (ref 0.00–0.07)
Basophils Absolute: 0 10*3/uL (ref 0.0–0.1)
Basophils Relative: 0 %
Eosinophils Absolute: 0.1 10*3/uL (ref 0.0–0.5)
Eosinophils Relative: 1 %
HCT: 37 % (ref 36.0–46.0)
Hemoglobin: 12.5 g/dL (ref 12.0–15.0)
Immature Granulocytes: 0 %
Lymphocytes Relative: 31 %
Lymphs Abs: 1.4 10*3/uL (ref 0.7–4.0)
MCH: 29.1 pg (ref 26.0–34.0)
MCHC: 33.8 g/dL (ref 30.0–36.0)
MCV: 86.2 fL (ref 80.0–100.0)
Monocytes Absolute: 0.4 10*3/uL (ref 0.1–1.0)
Monocytes Relative: 8 %
Neutro Abs: 2.8 10*3/uL (ref 1.7–7.7)
Neutrophils Relative %: 60 %
Platelet Count: 237 10*3/uL (ref 150–400)
RBC: 4.29 MIL/uL (ref 3.87–5.11)
RDW: 13.2 % (ref 11.5–15.5)
WBC Count: 4.7 10*3/uL (ref 4.0–10.5)
nRBC: 0 % (ref 0.0–0.2)

## 2022-02-14 LAB — VITAMIN B12: Vitamin B-12: 710 pg/mL (ref 180–914)

## 2022-02-14 MED ORDER — POTASSIUM CHLORIDE ER 10 MEQ PO TBCR: EXTENDED_RELEASE_TABLET | ORAL | 1 refills | Status: DC

## 2022-02-14 MED ORDER — AMLODIPINE BESYLATE 5 MG PO TABS: ORAL_TABLET | ORAL | 1 refills | Status: DC

## 2022-02-14 MED ORDER — IRBESARTAN 150 MG PO TABS: ORAL_TABLET | ORAL | 1 refills | Status: DC

## 2022-02-14 NOTE — Telephone Encounter (Signed)
Pt's medications were sent to pt's pharmacy as requested. Confirmation received.  

## 2022-02-14 NOTE — Telephone Encounter (Signed)
*  STAT* If patient is at the pharmacy, call can be transferred to refill team.   1. Which medications need to be refilled? (please list name of each medication and dose if known)  amLODipine (NORVASC) 5 MG tablet potassium chloride (KLOR-CON) 10 MEQ tablet irbesartan (AVAPRO) 150 MG tablet  2. Which pharmacy/location (including street and city if local pharmacy) is medication to be sent to? [place], [place]    3. Do they need a 30 day or 90 day supply?  90 day supply  Patient is scheduled for 7/08 with Dr. Burt Knack.

## 2022-02-15 ENCOUNTER — Other Ambulatory Visit: Payer: Self-pay | Admitting: Cardiovascular Disease

## 2022-02-15 ENCOUNTER — Telehealth: Payer: Self-pay

## 2022-02-15 ENCOUNTER — Telehealth: Payer: Self-pay | Admitting: Cardiovascular Disease

## 2022-02-15 MED ORDER — ISOSORBIDE MONONITRATE ER 30 MG PO TB24: ORAL_TABLET | ORAL | 0 refills | Status: DC

## 2022-02-15 MED ORDER — HYDROCHLOROTHIAZIDE 25 MG PO TABS: 25.0000 mg | ORAL_TABLET | Freq: Every day | ORAL | 0 refills | Status: DC

## 2022-02-15 MED ORDER — ATORVASTATIN CALCIUM 40 MG PO TABS: ORAL_TABLET | ORAL | 0 refills | Status: DC

## 2022-02-15 NOTE — Telephone Encounter (Signed)
Pt scheduled to see Ambrose Pancoast, NP on 02/27/22. Medications refilled for 30 day supply until then.

## 2022-02-15 NOTE — Telephone Encounter (Addendum)
Called patient to relay message below. Patient voiced full understanding     ----- Message from Alla Feeling, NP sent at 02/15/2022  2:27 PM EST ----- Please call pt, let her know CBC is normal and B12 came down to normal. Have her restart B12 injection every 6 weeks (previously every 4).   Thanks, Regan Rakers, NP

## 2022-02-15 NOTE — Telephone Encounter (Signed)
*  STAT* If patient is at the pharmacy, call can be transferred to refill team.   1. Which medications need to be refilled? (please list name of each medication and dose if known) atorvastatin (LIPITOR) 40 MG tablet    hydrochlorothiazide (HYDRODIURIL) 25 MG tablet   irbesartan (AVAPRO) 150 MG tablet   isosorbide mononitrate (IMDUR) 30 MG 24 hr tablet   2. Which pharmacy/location (including street and city if local pharmacy) is medication to be sent to?  [place], [place]    3. Do they need a 30 day or 90 day supply? [place]

## 2022-02-15 NOTE — Telephone Encounter (Signed)
Pt is requesting a refill on these medications, until appt on 07/10/22. Pt has not been seen sent 11/29/20, dose pt need to be seen sooner to receive these medications? Please advise

## 2022-02-26 NOTE — Progress Notes (Unsigned)
Office Visit    Patient Name: Mary Randall Date of Encounter: 02/26/2022  Primary Care Provider:  Kristen Loader, [place] Primary Cardiologist:  Sherren Mocha, MD Primary Electrophysiologist: None  Chief Complaint    Mary Randall is a 67 y.o. 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  Past Medical History    Past Medical History:  Diagnosis Date   Angina effort    none recent   Arthritis    "right knee" (03/16/2015)   Breast cancer, left ([place]) dx'd 2016   Coronary vasospasm ([place]) 2010; 03/16/2014   Prinz-Metal's angina   Family history of breast cancer    Family history of breast cancer in female    Family history of pancreatic cancer    Hx of radiation therapy 05/17/15- 06/28/15   Left Breast   Hypertension    NSTEMI (non-ST elevated myocardial infarction) ([place]) 03/16/2014   non-obs CAD, spasm seen   Personal history of chemotherapy    Personal history of radiation therapy    Pneumonia 2009   Past Surgical History:  Procedure Laterality Date   BREAST BIOPSY Left 08/06/14   BREAST LUMPECTOMY Left 2017   chemo and radiation   BREAST LUMPECTOMY WITH NEEDLE LOCALIZATION AND AXILLARY LYMPH NODE DISSECTION Left 03/15/2015   Procedure: LEFT BREAST LUMPECTOMY WITH BRACKETED NEEDLE LOCALIZATION AND AXILLARY LYMPH NODE DISSECTION;  Surgeon: Autumn Messing III, MD;  Location: [facility];  Service: General;  Laterality: Left;   BUNIONECTOMY Bilateral    CARDIAC CATHETERIZATION  2010; 03/16/2014   ECTOPIC PREGNANCY SURGERY  1991   Tubal pregnancy   LEFT HEART CATHETERIZATION WITH CORONARY ANGIOGRAM N/A 03/16/2014   Procedure: LEFT HEART CATHETERIZATION WITH CORONARY ANGIOGRAM;  Surgeon: Sherren Mocha, MD; Non-obs CAD, EF 55%   PORT-A-CATH REMOVAL Right 03/15/2015   Procedure: REMOVAL PORT-A-CATH;  Surgeon: Autumn Messing  III, MD;  Location: [facility];  Service: General;  Laterality: Right;   PORTACATH PLACEMENT N/A 08/28/2014   Procedure: INSERTION PORT-A-CATH;  Surgeon: Autumn Messing III, MD;  Location: [facility];  Service: General;  Laterality: N/A;   RE-EXCISION OF BREAST CANCER,SUPERIOR MARGINS Left 04/21/2015   Procedure: RE-EXCISION OF BREAST CANCER, INFERIOR MARGIN;  Surgeon: Autumn Messing III, MD;  Location: [facility];  Service: General;  Laterality: Left;   TUBAL LIGATION  1981    Allergies  Allergies  Allergen Reactions   Carvedilol Other (See Comments)    Dizziness   Codeine Nausea And Vomiting and Other (See Comments)    Dizzy   Percocet [Oxycodone-Acetaminophen] Nausea And Vomiting and Other (See Comments)    Light headedness, dizziness   Adhesive [Tape] Rash    Sensitive to electrode adhesives: rash, whelps, 'red' marks from electrodes.      Compazine [Prochlorperazine] Nausea And Vomiting    History of Present Illness    Mary Randall  is a 67 year old female with the above mention past medical history who presents today for follow-up of coronary artery disease.  Mary Randall presented to the ED in 2016 with complaint of chest pain and nausea after running out of Imdur.  She endorsed mild pain initially that progressed to severe burning in the chest and was associated with nonspecific T wave changes.  She was sent to the catheterization lab and underwent left heart cath that revealed patent coronaries with mild  diffuse nonobstructive disease and normal LV function.  She underwent 2D echo in 2016 that revealed EF of 55-60% with mildly dilated LA and normal valve function.  She was seen in follow-up 08/2018 and experience some dizziness and lightheadedness that resolved with reduction in amlodipine.  She was last seen by Robbie Lis, PA on 11/2020 with no complaints of chest pain.  Her blood pressure was noted to be elevated at 150/100 however patient was instructed to monitor blood pressures with plan to change in 2 weeks  of readings remain elevated.  Mary Randall presents today for 1 year follow-up alone.  Since last being seen in the office patient reports she has been doing well with no recurrence of chest pain since her previous visit.  Her blood pressures today are well-controlled at 128/82 and heart rate is currently 75 bpm.  She is compliant with her current medication regimen and denies any adverse reactions.  She works as a Development worker, community carrier and reports that she walks over 5 hours with her job.  She reports some indiscretions with sodium but for the most part follows a low-sodium diet..  Patient denies chest pain, palpitations, dyspnea, PND, orthopnea, nausea, vomiting, dizziness, syncope, edema, weight gain, or early satiety.   Home Medications    Current Outpatient Medications  Medication Sig Dispense Refill   amLODipine (NORVASC) 5 MG tablet TAKE 1 TABLET(5 MG) BY MOUTH DAILY 90 tablet 1   atorvastatin (LIPITOR) 40 MG tablet TAKE 1 TABLET BY MOUTH EVERY DAY AT 6 PM 30 tablet 0   DODEX 1000 MCG/ML injection ADMINISTER 1 ML(1000 MCG) IN THE MUSCLE 1 TIME FOR 1 DOSE 1 mL 11   hydrochlorothiazide (HYDRODIURIL) 25 MG tablet Take 1 tablet (25 mg total) by mouth daily. 30 tablet 0   irbesartan (AVAPRO) 150 MG tablet TAKE 1 TABLET(150 MG) BY MOUTH DAILY 90 tablet 1   isosorbide mononitrate (IMDUR) 30 MG 24 hr tablet TAKE 1 TABLET(30 MG) BY MOUTH DAILY 30 tablet 0   letrozole (FEMARA) 2.5 MG tablet TAKE 1 TABLET(2.5 MG) BY MOUTH DAILY 90 tablet 3   nitroGLYCERIN (NITROSTAT) 0.4 MG SL tablet Place 1 tablet (0.4 mg total) under the tongue every 5 (five) minutes as needed for chest pain. 25 tablet 12   potassium chloride (KLOR-CON) 10 MEQ tablet TAKE 1 TABLET(10 MEQ) BY MOUTH EVERY EVENING. 90 tablet 1   No current facility-administered medications for this visit.     Review of Systems  All other systems reviewed and are otherwise negative except as noted above.  Physical Exam    Wt Readings from Last 3 Encounters:   11/10/21 169 lb 11.2 oz (77 kg)  11/29/20 170 lb (77.1 kg)  11/12/20 165 lb (74.8 kg)   [id number] were no vitals filed for this visit.,There is no height or weight on file to calculate BMI.  Constitutional:      Appearance: Healthy appearance. Not in distress.  Neck:     Vascular: JVD normal.  Pulmonary:     Effort: Pulmonary effort is normal.     Breath sounds: No wheezing. No rales. Diminished in the bases Cardiovascular:     Normal rate. Regular rhythm. Normal S1. Normal S2.      Murmurs: Crescendo decrescendo auscultated 2 out of 6 at RUSB Edema:    Peripheral edema absent Abdominal:     Palpations: Abdomen is soft non tender. There is no hepatomegaly.  Skin:    General: Skin is warm and dry.  Neurological:  General: No focal deficit present.     Mental Status: Alert and oriented to person, place and time.     Cranial Nerves: Cranial nerves are intact.  EKG/LABS/Other Studies Reviewed    ECG personally reviewed by me today -sinus rhythm with right axis deviation and TWI in leads I and V2 consistent with previous EKG with no acute changes noted and rate of 72 bpm.  Risk Assessment/Calculations:     Lab Results  Component Value Date   WBC 4.7 02/14/2022   HGB 12.5 02/14/2022   HCT 37.0 02/14/2022   MCV 86.2 02/14/2022   PLT 237 02/14/2022   Lab Results  Component Value Date   CREATININE 0.99 11/10/2021   BUN 10 11/10/2021   NA 141 11/10/2021   K 3.8 11/10/2021   CL 105 11/10/2021   CO2 29 11/10/2021   Lab Results  Component Value Date   ALT 9 11/10/2021   AST 13 (L) 11/10/2021   ALKPHOS 70 11/10/2021   BILITOT 0.8 11/10/2021   Lab Results  Component Value Date   CHOL 144 03/17/2014   HDL 39 (L) 03/17/2014   LDLCALC 78 03/17/2014   TRIG 136 03/17/2014   CHOLHDL 3.7 03/17/2014    Lab Results  Component Value Date   HGBA1C 5.9 (H) 09/04/2012    Assessment & Plan    1.  Nonobstructive CAD: -s/p LHC 2016 with normal coronaries with mild  nonobstructive CAD noted.  She is currently on Imdur 30 mg daily and amlodipine 5 mg for Prinzmetal angina. -Today patient reports no recurrence of chest pain since her previous visit. -Continue low-sodium heart healthy diet  2.  Essential hypertension: -Patient's last blood pressure was well-controlled at 120/82 -Continue Norvasc 5 mg daily, HCTZ 25 mg, Avapro 150 mg daily  3.  Hyperlipidemia: -We will check LFTs and lipids today -Continue Lipitor 40 mg pending results of LFTs and lipids.  4.  History of left breast cancer: -Currently followed by oncology and recently completed chemotherapy  5. Systolic Murmur: -Crescendo decrescendo murmur auscultated at right upper sternal border -We will have her complete 2D echo to evaluate for any structural or valvular heart changes due to history of breast CA and chemotherapy  Disposition: Follow-up with Sherren Mocha, MD or APP in 12 months   Medication Adjustments/Labs and Tests Ordered: Current medicines are reviewed at length with the patient today.  Concerns regarding medicines are outlined above.   Signed, Mable Fill, Marissa Nestle, NP 02/26/2022, 10:56 AM [facility]  Note:  This document was prepared using Dragon voice recognition software and may include unintentional dictation errors.

## 2022-02-27 ENCOUNTER — Ambulatory Visit: Payer: Federal, State, Local not specified - PPO | Attending: Cardiovascular Disease | Admitting: Nurse Practitioner

## 2022-02-27 ENCOUNTER — Encounter: Payer: Self-pay | Admitting: Nurse Practitioner

## 2022-02-27 VITALS — BP 128/82 | HR 75 | Ht 66.0 in | Wt 168.0 lb

## 2022-02-27 DIAGNOSIS — I10 Essential (primary) hypertension: Secondary | ICD-10-CM | POA: Diagnosis not present

## 2022-02-27 DIAGNOSIS — I2510 Atherosclerotic heart disease of native coronary artery without angina pectoris: Secondary | ICD-10-CM | POA: Diagnosis not present

## 2022-02-27 DIAGNOSIS — I201 Angina pectoris with documented spasm: Secondary | ICD-10-CM

## 2022-02-27 DIAGNOSIS — I350 Nonrheumatic aortic (valve) stenosis: Secondary | ICD-10-CM | POA: Diagnosis not present

## 2022-02-27 DIAGNOSIS — E785 Hyperlipidemia, unspecified: Secondary | ICD-10-CM

## 2022-02-27 MED ORDER — POTASSIUM CHLORIDE ER 10 MEQ PO TBCR: 10.0000 meq | EXTENDED_RELEASE_TABLET | Freq: Every evening | ORAL | 3 refills | Status: DC

## 2022-02-27 MED ORDER — ISOSORBIDE MONONITRATE ER 30 MG PO TB24: 30.0000 mg | ORAL_TABLET | Freq: Every day | ORAL | 3 refills | Status: DC

## 2022-02-27 MED ORDER — AMLODIPINE BESYLATE 5 MG PO TABS: 5.0000 mg | ORAL_TABLET | Freq: Every day | ORAL | 3 refills | Status: DC

## 2022-02-27 MED ORDER — NITROGLYCERIN 0.4 MG SL SUBL: 0.4000 mg | SUBLINGUAL_TABLET | SUBLINGUAL | 3 refills | Status: AC | PRN

## 2022-02-27 MED ORDER — IRBESARTAN 150 MG PO TABS: 150.0000 mg | ORAL_TABLET | Freq: Every day | ORAL | 3 refills | Status: DC

## 2022-02-27 MED ORDER — HYDROCHLOROTHIAZIDE 25 MG PO TABS: 25.0000 mg | ORAL_TABLET | Freq: Every day | ORAL | 3 refills | Status: DC

## 2022-02-27 NOTE — Patient Instructions (Signed)
Medication Instructions:  Your physician recommends that you continue on your current medications as directed. Please refer to the Current Medication list given to you today. *If you need a refill on your cardiac medications before your next appointment, please call your pharmacy*   Lab Work: TODAY-LIPID & LFT If you have labs (blood work) drawn today and your tests are completely normal, you will receive your results only by: [place] (if you have MyChart) OR A paper copy in the mail If you have any lab test that is abnormal or we need to change your treatment, we will call you to review the results.   Testing/Procedures: Your physician has requested that you have an echocardiogram. Echocardiography is a painless test that uses sound waves to create images of your heart. It provides your doctor with information about the size and shape of your heart and how well your heart's chambers and valves are working. This procedure takes approximately one hour. There are no restrictions for this procedure. Please do NOT wear cologne, perfume, aftershave, or lotions (deodorant is allowed). Please arrive 15 minutes prior to your appointment time.   Follow-Up: At [facility], you and your health needs are our priority.  As part of our continuing mission to provide you with exceptional heart care, we have created designated Provider Care Teams.  These Care Teams include your primary Cardiologist (physician) and Advanced Practice Providers (APPs -  Physician Assistants and Nurse Practitioners) who all work together to provide you with the care you need, when you need it.  We recommend signing up for the patient portal called "MyChart".  Sign up information is provided on this After Visit Summary.  MyChart is used to connect with patients for Virtual Visits (Telemedicine).  Patients are able to view lab/test results, encounter notes, upcoming appointments, etc.  Non-urgent messages can be  sent to your provider as well.   To learn more about what you can do with MyChart, go to [email or web address].    Your next appointment:   12 month(s)  Provider:   Sherren Mocha, MD     Other Instructions

## 2022-02-28 LAB — HEPATIC FUNCTION PANEL
ALT: 11 IU/L (ref 0–32)
AST: 14 IU/L (ref 0–40)
Albumin: 4.1 g/dL (ref 3.9–4.9)
Alkaline Phosphatase: 87 IU/L (ref 44–121)
Bilirubin Total: 0.5 mg/dL (ref 0.0–1.2)
Bilirubin, Direct: 0.15 mg/dL (ref 0.00–0.40)
Total Protein: 6.4 g/dL (ref 6.0–8.5)

## 2022-02-28 LAB — LIPID PANEL
Chol/HDL Ratio: 3.1 ratio (ref 0.0–4.4)
Cholesterol, Total: 156 mg/dL (ref 100–199)
HDL: 50 mg/dL (ref >39)
LDL Chol Calc (NIH): 94 mg/dL (ref 0–99)
Triglycerides: 58 mg/dL (ref 0–149)
VLDL Cholesterol Cal: 12 mg/dL (ref 5–40)

## 2022-03-23 ENCOUNTER — Ambulatory Visit (HOSPITAL_COMMUNITY): Payer: Federal, State, Local not specified - PPO | Attending: Nurse Practitioner

## 2022-03-23 DIAGNOSIS — E785 Hyperlipidemia, unspecified: Secondary | ICD-10-CM

## 2022-03-23 DIAGNOSIS — I2510 Atherosclerotic heart disease of native coronary artery without angina pectoris: Secondary | ICD-10-CM | POA: Diagnosis not present

## 2022-03-23 DIAGNOSIS — I350 Nonrheumatic aortic (valve) stenosis: Secondary | ICD-10-CM | POA: Diagnosis not present

## 2022-03-23 DIAGNOSIS — I10 Essential (primary) hypertension: Secondary | ICD-10-CM

## 2022-03-23 DIAGNOSIS — I201 Angina pectoris with documented spasm: Secondary | ICD-10-CM | POA: Diagnosis not present

## 2022-03-23 LAB — ECHOCARDIOGRAM COMPLETE
Area-P 1/2: 3.17 cm2
S' Lateral: 2.7 cm

## 2022-04-30 ENCOUNTER — Other Ambulatory Visit: Payer: Self-pay | Admitting: Cardiovascular Disease

## 2022-05-15 ENCOUNTER — Ambulatory Visit
Admission: RE | Admit: 2022-05-15 | Discharge: 2022-05-15 | Disposition: A | Discharge status: Home / Self Care | Payer: Federal, State, Local not specified - PPO | Source: Ambulatory Visit | Attending: Nurse Practitioner | Admitting: Nurse Practitioner

## 2022-05-15 DIAGNOSIS — N951 Menopausal and female climacteric states: Secondary | ICD-10-CM | POA: Diagnosis not present

## 2022-05-15 DIAGNOSIS — C50212 Malignant neoplasm of upper-inner quadrant of left female breast: Secondary | ICD-10-CM

## 2022-05-15 DIAGNOSIS — M8580 Other specified disorders of bone density and structure, unspecified site: Secondary | ICD-10-CM | POA: Diagnosis not present

## 2022-05-15 DIAGNOSIS — M810 Age-related osteoporosis without current pathological fracture: Secondary | ICD-10-CM | POA: Diagnosis not present

## 2022-05-15 DIAGNOSIS — Z8781 Personal history of (healed) traumatic fracture: Secondary | ICD-10-CM | POA: Diagnosis not present

## 2022-05-18 ENCOUNTER — Telehealth: Payer: Self-pay

## 2022-05-18 NOTE — Telephone Encounter (Addendum)
Called patient and relayed message below as per Dr. Mosetta Putt, patient voiced full understanding. She will start taking Vitamin D as per Santiago Glad NP  ----- Message from Pollyann Samples, NP sent at 05/18/2022 11:29 AM EDT ----- Please call pt, let her know bone density has decreased slightly since 2022, still in osteopenia range not osteoporosis, and low fracture risk. I recommend daily vit D if she is not taking it, and continue weight bearing exercise.   Thanks, Clayborn Heron NP

## 2022-07-10 ENCOUNTER — Ambulatory Visit: Payer: Federal, State, Local not specified - PPO | Admitting: Cardiovascular Disease

## 2022-11-10 ENCOUNTER — Telehealth: Payer: Self-pay | Admitting: Hematology

## 2022-11-10 ENCOUNTER — Other Ambulatory Visit: Payer: Self-pay

## 2022-11-13 ENCOUNTER — Other Ambulatory Visit: Payer: Self-pay

## 2022-11-13 ENCOUNTER — Inpatient Hospital Stay: Payer: Federal, State, Local not specified - PPO

## 2022-11-13 ENCOUNTER — Inpatient Hospital Stay: Payer: Federal, State, Local not specified - PPO | Admitting: Nurse Practitioner

## 2022-11-13 DIAGNOSIS — C50212 Malignant neoplasm of upper-inner quadrant of left female breast: Secondary | ICD-10-CM

## 2022-11-14 ENCOUNTER — Other Ambulatory Visit: Payer: Self-pay | Admitting: Hematology

## 2022-11-14 DIAGNOSIS — E538 Deficiency of other specified B group vitamins: Secondary | ICD-10-CM

## 2022-11-14 NOTE — Assessment & Plan Note (Signed)
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  -She started antiestrogen therapy with Letrozole since 07/2015, tolerating well without side effects except hair loss. She has considered stopping AI because of this for over a year, but has agreed to continue.  However due to the negative impact on her quality of life she stopped in 01/2022.

## 2022-11-15 ENCOUNTER — Inpatient Hospital Stay: Payer: Federal, State, Local not specified - PPO | Admitting: Hematology

## 2022-11-15 ENCOUNTER — Inpatient Hospital Stay: Payer: Federal, State, Local not specified - PPO | Attending: Hematology

## 2022-11-15 ENCOUNTER — Encounter: Payer: Self-pay | Admitting: Hematology

## 2022-11-15 VITALS — BP 141/105 | HR 82 | Temp 98.4°F | Resp 18 | Ht 66.0 in | Wt 172.6 lb

## 2022-11-15 DIAGNOSIS — Z9221 Personal history of antineoplastic chemotherapy: Secondary | ICD-10-CM | POA: Insufficient documentation

## 2022-11-15 DIAGNOSIS — Z853 Personal history of malignant neoplasm of breast: Secondary | ICD-10-CM | POA: Insufficient documentation

## 2022-11-15 DIAGNOSIS — Z170 Estrogen receptor positive status [ER+]: Secondary | ICD-10-CM

## 2022-11-15 DIAGNOSIS — E538 Deficiency of other specified B group vitamins: Secondary | ICD-10-CM | POA: Diagnosis not present

## 2022-11-15 DIAGNOSIS — Z08 Encounter for follow-up examination after completed treatment for malignant neoplasm: Secondary | ICD-10-CM | POA: Insufficient documentation

## 2022-11-15 DIAGNOSIS — C50212 Malignant neoplasm of upper-inner quadrant of left female breast: Secondary | ICD-10-CM | POA: Diagnosis not present

## 2022-11-15 DIAGNOSIS — Z923 Personal history of irradiation: Secondary | ICD-10-CM | POA: Diagnosis not present

## 2022-11-15 LAB — CMP (CANCER CENTER ONLY)
ALT: 10 U/L (ref 0–44)
AST: 13 U/L — ABNORMAL LOW (ref 15–41)
Albumin: 4.2 g/dL (ref 3.5–5.0)
Alkaline Phosphatase: 83 U/L (ref 38–126)
Anion gap: 5 (ref 5–15)
BUN: 16 mg/dL (ref 8–23)
CO2: 30 mmol/L (ref 22–32)
Calcium: 10.6 mg/dL — ABNORMAL HIGH (ref 8.9–10.3)
Chloride: 104 mmol/L (ref 98–111)
Creatinine: 1 mg/dL (ref 0.44–1.00)
GFR, Estimated: >60 mL/min (ref >60)
Glucose, Bld: 94 mg/dL (ref 70–99)
Potassium: 4 mmol/L (ref 3.5–5.1)
Sodium: 139 mmol/L (ref 135–145)
Total Bilirubin: 0.7 mg/dL (ref <1.2)
Total Protein: 7.1 g/dL (ref 6.5–8.1)

## 2022-11-15 LAB — CBC WITH DIFFERENTIAL (CANCER CENTER ONLY)
Abs Immature Granulocytes: 0 10*3/uL (ref 0.00–0.07)
Basophils Absolute: 0 10*3/uL (ref 0.0–0.1)
Basophils Relative: 1 %
Eosinophils Absolute: 0.1 10*3/uL (ref 0.0–0.5)
Eosinophils Relative: 1 %
HCT: 39.6 % (ref 36.0–46.0)
Hemoglobin: 12.8 g/dL (ref 12.0–15.0)
Immature Granulocytes: 0 %
Lymphocytes Relative: 35 %
Lymphs Abs: 1.3 10*3/uL (ref 0.7–4.0)
MCH: 28.7 pg (ref 26.0–34.0)
MCHC: 32.3 g/dL (ref 30.0–36.0)
MCV: 88.8 fL (ref 80.0–100.0)
Monocytes Absolute: 0.3 10*3/uL (ref 0.1–1.0)
Monocytes Relative: 8 %
Neutro Abs: 2 10*3/uL (ref 1.7–7.7)
Neutrophils Relative %: 55 %
Platelet Count: 194 10*3/uL (ref 150–400)
RBC: 4.46 MIL/uL (ref 3.87–5.11)
RDW: 13.2 % (ref 11.5–15.5)
WBC Count: 3.6 10*3/uL — ABNORMAL LOW (ref 4.0–10.5)
nRBC: 0 % (ref 0.0–0.2)

## 2022-11-15 LAB — VITAMIN B12: Vitamin B-12: 542 pg/mL (ref 180–914)

## 2022-11-15 NOTE — Progress Notes (Signed)
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  -She started antiestrogen therapy with Letrozole since 07/2015, tolerating well without side effects except hair loss. She has considered stopping AI because of this for over a year, but has agreed to continue.  She completed a total of 7 years therapy in 06/2022.    Assessment and Plan    Breast Cancer Follow-up 67 year old, 7 years post-breast cancer diagnosis, completed 7 years of letrozole therapy. No new breast symptoms or concerns. Left breast remains slightly dark and firm from previous radiation but is non-tender. Regular self-breast exams performed. Annual follow-up recommended until 10 years post-diagnosis. - Continue annual follow-up visits until 10 years post-diagnosis - Order mammogram for next year  Vaginal Dryness Reports significant vaginal dryness since cancer diagnosis, leading to avoidance of sexual activity. Non-estrogen creams discussed. Advised to consult gynecologist about potential vaginal dilation therapy (Monalisa), which may benefit some patients. - Try non-estrogen containing vaginal creams - Consult gynecologist for potential vaginal dilation therapy (Monalisa)  Vitamin B12 Deficiency Managed with injections, last in July 2024. Switched to liquid multivitamin containing  B12, reports no deficiency symptoms. B12 levels checked during this visit. - Continue liquid multivitamin containing B12 if levels are adequate - Monitor B12 levels  General Health Maintenance Generally well with good appetite, normal energy levels, and no new pain. Performs regular self-breast exams. - Continue regular self-breast exams - Annual follow-up visit in one year.      Plan -She is clinically doing well, no clinical concern for recurrence -Lab and follow-up in 1 year   SUMMARY OF ONCOLOGIC HISTORY: Oncology History Overview Note  Breast cancer of upper-inner quadrant of left female breast (HCC)   Staging form: Breast, AJCC 7th Edition     Clinical: Stage IIIA (T3, N1, M0) - Unsigned     Pathologic stage from 03/15/2015: Stage IIA (yT1c, N1a, cM0) - Signed by Malachy Mood, MD on 04/02/2015     Breast cancer of upper-inner quadrant of left female breast (HCC)  08/06/2014 Initial Diagnosis   Breast 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   08/06/2014 Receptors her2   ER 100% positive, PR 5% positive, HER2-, Ki-67 50%   08/06/2014 Mammogram   Diagnostic mammogram and ultrasound showed left breast mass with associated calcification measuring 5.5 cm, indeterminate a  left axillary lymph node was cortical thickening, circumscribed low density oval 8mm mass at 12:00 within the left breast.   09/03/2014 - 01/26/2015 Neo-Adjuvant Chemotherapy   ddAC every 2 weeks X4, followed by weekly Taxol X12    09/23/2014 Procedure   Genetic testing: Negative. Genes evaluated: ATM, BARD1, BRCA1, BRCA2, BRIP1, CDH1, CHEK2, EPCAM, FANCC, MLH1, MSH2, MSH6, NBN, PALB2, PMS2, PTEN, RAD51C, RAD51D, TP53, & XRCC2.    03/15/2015 Surgery  left lumpectomy and axillary node dissection Carolynne Edouard)   03/15/2015 Pathology Results    left lumpectomy showed G2 invasive ductal carcinoma, 2cm, G2, and DCIS, G2. the lateral margin was positive for invasive tumor, 1 out of 15 nodes was positive.     ypT1c,ypN1a: Stage IIA    04/21/2015 Surgery   Re-excision for positive margin. Surgical path was negative for malignancy.   05/17/2015 - 06/28/2015 Radiation Therapy   adjuvant breast radiation Basilio Cairo). Left breast treated to 50 Gy in 25 fractions.  Left SCLV and PAB treated to 45 Gy in 25 fractions.  Left breast boost treated to 10 Gy in 5 fractions    07/12/2015 -  Anti-estrogen oral therapy   Letrozole 2.5 mg once daily. Planned duration of therapy: 10 years.    08/19/2015 Imaging   DEXA scan: Normal. (T-score -1.0)   08/24/2015 Mammogram   Diagnostic mammogram: No evidence for malignancy.    09/18/2016 Mammogram   IMPRESSION: Stable lumpectomy changes on the left. No evidence of malignancy bilaterally.   09/20/2017 Mammogram   09/20/2017 Mammogram IMPRESSION: Stable left breast lumpectomy site. No mammographic evidence of malignancy in the bilateral breasts.   09/26/2018 Mammogram   FINDINGS: There are lumpectomy changes in the lower inner left breast. Surgical changes in the inferior left axilla. No suspicious mass, suspicious microcalcification, or architectural distortion is identified to suggest malignancy in either breast. IMPRESSION: No evidence of malignancy in either breast.      Discussed the use of AI scribe software for clinical note transcription with the patient, who gave verbal consent to proceed.  History of Present Illness   A 67 year old female with a history of breast cancer presents for a routine follow-up. She completed seven years of anti-estrogen therapy in July of the current year and reports no significant changes since discontinuing the medication. She denies any new pains or concerns about her breast. She has been maintaining her employment with [employer] and overall, she reports feeling well with  normal appetite and energy levels. She does report experiencing vaginal dryness, which has been an ongoing issue since her cancer diagnosis. She has not been sexually active since her diagnosis due to this issue. She has been managing her health with the use of liquid vitamins, including B12, instead of B12 injections. She is due for a mammogram next year.         All other systems were reviewed with the patient and are negative.  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   Family history of breast cancer    Family history of breast cancer in female    Family history of pancreatic cancer    Hx of radiation therapy 05/17/15- 06/28/15   Left Breast   Hypertension    NSTEMI (non-ST elevated myocardial infarction) (HCC) 03/16/2014   non-obs CAD, spasm seen   Personal history of chemotherapy    Personal history of radiation therapy    Pneumonia 2009    SURGICAL HISTORY: Past Surgical History:  Procedure Laterality Date   BREAST BIOPSY Left 08/06/14   BREAST LUMPECTOMY Left 2017   chemo and radiation   BREAST LUMPECTOMY WITH NEEDLE LOCALIZATION AND AXILLARY LYMPH NODE DISSECTION Left 03/15/2015   Procedure: LEFT BREAST LUMPECTOMY WITH BRACKETED NEEDLE LOCALIZATION AND AXILLARY LYMPH NODE  DISSECTION;  Surgeon: Chevis Pretty III, MD;  Location[facility] OR;  Service: General;  Laterality: Left;   BUNIONECTOMY Bilateral    CARDIAC CATHETERIZATION  2010; 03/16/2014   ECTOPIC PREGNANCY SURGERY  1991   Tubal pregnancy   LEFT HEART CATHETERIZATION WITH CORONARY ANGIOGRAM N/A 03/16/2014   Procedure: LEFT HEART CATHETERIZATION WITH CORONARY ANGIOGRAM;  Surgeon: Tonny Bollman, MD; Non-obs CAD, EF 55%   PORT-A-CATH REMOVAL Right 03/15/2015   Procedure: REMOVAL PORT-A-CATH;  Surgeon: Chevis Pretty III, MD;  Location: [facility] OR;   Service: General;  Laterality: Right;   PORTACATH PLACEMENT N/A 08/28/2014   Procedure: INSERTION PORT-A-CATH;  Surgeon: Chevis Pretty III, MD;  Location: [facility] OR;  Service: General;  Laterality: N/A;   RE-EXCISION OF BREAST CANCER,SUPERIOR MARGINS Left 04/21/2015   Procedure: RE-EXCISION OF BREAST CANCER, INFERIOR MARGIN;  Surgeon: Chevis Pretty III, MD;  Location: [facility] ORS;  Service: General;  Laterality: Left;   TUBAL LIGATION  1981    I have reviewed the social history and family history with the patient and they are unchanged from previous note.  ALLERGIES:  is allergic to carvedilol, codeine, percocet [oxycodone-acetaminophen], adhesive [tape], and compazine [prochlorperazine].  MEDICATIONS:  Current Outpatient Medications  Medication Sig Dispense Refill   amLODipine (NORVASC) 5 MG tablet Take 1 tablet (5 mg total) by mouth daily. 90 tablet 3   atorvastatin (LIPITOR) 40 MG tablet TAKE 1 TABLET BY MOUTH EVERY DAY AT 6 PM 90 tablet 3   hydrochlorothiazide (HYDRODIURIL) 25 MG tablet Take 1 tablet (25 mg total) by mouth daily. 90 tablet 3   irbesartan (AVAPRO) 150 MG tablet Take 1 tablet (150 mg total) by mouth daily. TAKE 1 TABLET(150 MG) BY MOUTH DAILY 90 tablet 3   isosorbide mononitrate (IMDUR) 30 MG 24 hr tablet Take 1 tablet (30 mg total) by mouth daily. 90 tablet 3   nitroGLYCERIN (NITROSTAT) 0.4 MG SL tablet Place 1 tablet (0.4 mg total) under the tongue every 5 (five) minutes as needed for chest pain. 25 tablet 3   potassium chloride (KLOR-CON) 10 MEQ tablet Take 1 tablet (10 mEq total) by mouth every evening. 90 tablet 3   No current facility-administered medications for this visit.    PHYSICAL EXAMINATION: ECOG PERFORMANCE STATUS: 0 - Asymptomatic  Vitals:   11/15/22 0917  BP: (!) 141/105  Pulse: 82  Resp: 18  Temp: 98.4 F (36.9 C)  SpO2: 96%   Wt Readings from Last 3 Encounters:  11/15/22 172 lb 9.6 oz (78.3 kg)  02/27/22 168 lb (76.2 kg)  11/10/21 169 lb 11.2 oz (77 kg)      GENERAL:alert, no distress and comfortable SKIN: skin color, texture, turgor are normal, no rashes or significant lesions EYES: normal, Conjunctiva are pink and non-injected, sclera clear NECK: supple, thyroid normal size, non-tender, without nodularity LYMPH:  no palpable lymphadenopathy in the cervical, axillary  LUNGS: clear to auscultation and percussion with normal breathing effort HEART: regular rate & rhythm and no murmurs and no lower extremity edema ABDOMEN:abdomen soft, non-tender and normal bowel sounds Musculoskeletal:no cyanosis of digits and no clubbing  NEURO: alert & oriented x 3 with fluent speech, no focal motor/sensory deficits BREAST: Left breast normal, no mass or tenderness. Right breast incision well-healed, no scar tissue palpable.   LABORATORY DATA:  I have reviewed the data as listed    Latest Ref Rng & Units 11/15/2022    8:49 AM 02/14/2022    9:11 AM 11/10/2021    9:21 AM  CBC  WBC  4.0 - 10.5 K/uL 3.6  4.7  3.3   Hemoglobin 12.0 - 15.0 g/dL 16.1  09.6  04.5   Hematocrit 36.0 - 46.0 % 39.6  37.0  37.5   Platelets 150 - 400 K/uL 194  237  200         Latest Ref Rng & Units 11/15/2022    8:49 AM 02/27/2022    9:11 AM 11/10/2021    9:21 AM  CMP  Glucose 70 - 99 mg/dL 94   409   BUN 8 - 23 mg/dL 16   10   Creatinine 8.11 - 1.00 mg/dL 9.14   7.82   Sodium 956 - 145 mmol/L 139   141   Potassium 3.5 - 5.1 mmol/L 4.0   3.8   Chloride 98 - 111 mmol/L 104   105   CO2 22 - 32 mmol/L 30   29   Calcium 8.9 - 10.3 mg/dL 21.3   08.6   Total Protein 6.5 - 8.1 g/dL 7.1  6.4  6.8   Total Bilirubin <1.2 mg/dL 0.7  0.5  0.8   Alkaline Phos 38 - 126 U/L 83  87  70   AST 15 - 41 U/L 13  14  13    ALT 0 - 44 U/L 10  11  9        RADIOGRAPHIC STUDIES: I have personally reviewed the radiological images as listed and agreed with the findings in the report. No results found.    No orders of the defined types were placed in this encounter.  All questions were  answered. The patient knows to call the clinic with any problems, questions or concerns. No barriers to learning was detected. The total time spent in the appointment was 25 minutes.     Malachy Mood, MD 11/15/2022

## 2022-11-24 ENCOUNTER — Encounter: Payer: Self-pay | Admitting: Hematology

## 2022-12-11 ENCOUNTER — Other Ambulatory Visit: Payer: Self-pay | Admitting: Hematology

## 2022-12-11 DIAGNOSIS — Z1231 Encounter for screening mammogram for malignant neoplasm of breast: Secondary | ICD-10-CM

## 2022-12-29 ENCOUNTER — Other Ambulatory Visit: Payer: Self-pay

## 2022-12-29 ENCOUNTER — Telehealth: Payer: Self-pay

## 2022-12-29 ENCOUNTER — Other Ambulatory Visit: Payer: Self-pay | Admitting: Hematology

## 2022-12-29 DIAGNOSIS — E538 Deficiency of other specified B group vitamins: Secondary | ICD-10-CM

## 2022-12-29 NOTE — Telephone Encounter (Signed)
Thank you. I think she was able to get lab appointment and phone visit to review.  Herbert Seta, NP

## 2022-12-29 NOTE — Telephone Encounter (Signed)
Patient called in stating she use to take B12 injections and switched over to taking a liquid multivitamin that contained B12. She stated that she is feeling tired a lot more now and feels like she needs to start back on the B12 injection she wanted to have the NP call in a refill. Spoke with NP Vincent Gros and she stated that the patient will need to have labs drawn and a phone visit before we can renew the rx for the B12 injection. Will call patient and make her aware. Patient voiced full understanding.

## 2023-01-02 ENCOUNTER — Inpatient Hospital Stay: Payer: Federal, State, Local not specified - PPO | Attending: Hematology

## 2023-01-02 DIAGNOSIS — E538 Deficiency of other specified B group vitamins: Secondary | ICD-10-CM | POA: Insufficient documentation

## 2023-01-02 DIAGNOSIS — Z170 Estrogen receptor positive status [ER+]: Secondary | ICD-10-CM

## 2023-01-02 DIAGNOSIS — Z853 Personal history of malignant neoplasm of breast: Secondary | ICD-10-CM | POA: Diagnosis not present

## 2023-01-02 DIAGNOSIS — Z08 Encounter for follow-up examination after completed treatment for malignant neoplasm: Secondary | ICD-10-CM | POA: Insufficient documentation

## 2023-01-02 LAB — CBC WITH DIFFERENTIAL (CANCER CENTER ONLY)
Abs Immature Granulocytes: 0 10*3/uL (ref 0.00–0.07)
Basophils Absolute: 0 10*3/uL (ref 0.0–0.1)
Basophils Relative: 0 %
Eosinophils Absolute: 0 10*3/uL (ref 0.0–0.5)
Eosinophils Relative: 1 %
HCT: 40.3 % (ref 36.0–46.0)
Hemoglobin: 13.2 g/dL (ref 12.0–15.0)
Immature Granulocytes: 0 %
Lymphocytes Relative: 37 %
Lymphs Abs: 1.3 10*3/uL (ref 0.7–4.0)
MCH: 28.6 pg (ref 26.0–34.0)
MCHC: 32.8 g/dL (ref 30.0–36.0)
MCV: 87.4 fL (ref 80.0–100.0)
Monocytes Absolute: 0.3 10*3/uL (ref 0.1–1.0)
Monocytes Relative: 10 %
Neutro Abs: 1.8 10*3/uL (ref 1.7–7.7)
Neutrophils Relative %: 52 %
Platelet Count: 202 10*3/uL (ref 150–400)
RBC: 4.61 MIL/uL (ref 3.87–5.11)
RDW: 13.2 % (ref 11.5–15.5)
WBC Count: 3.5 10*3/uL — ABNORMAL LOW (ref 4.0–10.5)
nRBC: 0 % (ref 0.0–0.2)

## 2023-01-02 LAB — CMP (CANCER CENTER ONLY)
ALT: 11 U/L (ref 0–44)
AST: 15 U/L (ref 15–41)
Albumin: 4.3 g/dL (ref 3.5–5.0)
Alkaline Phosphatase: 86 U/L (ref 38–126)
Anion gap: 8 (ref 5–15)
BUN: 15 mg/dL (ref 8–23)
CO2: 29 mmol/L (ref 22–32)
Calcium: 10.7 mg/dL — ABNORMAL HIGH (ref 8.9–10.3)
Chloride: 103 mmol/L (ref 98–111)
Creatinine: 1.03 mg/dL — ABNORMAL HIGH (ref 0.44–1.00)
GFR, Estimated: 60 mL/min — ABNORMAL LOW (ref >60)
Glucose, Bld: 95 mg/dL (ref 70–99)
Potassium: 3.6 mmol/L (ref 3.5–5.1)
Sodium: 140 mmol/L (ref 135–145)
Total Bilirubin: 0.9 mg/dL (ref 0.0–1.2)
Total Protein: 7.4 g/dL (ref 6.5–8.1)

## 2023-01-02 LAB — VITAMIN B12: Vitamin B-12: 474 pg/mL (ref 180–914)

## 2023-01-02 LAB — FERRITIN: Ferritin: 75 ng/mL (ref 11–307)

## 2023-01-02 NOTE — Progress Notes (Signed)
 Waiting on ferritin results. Overall, labs not bad. Has phone appointment scheduled for 01/05/2023.

## 2023-01-04 NOTE — Assessment & Plan Note (Addendum)
 Recent b12 levels show normal Hgb, Hct, ferritin, and vitamin B12 levels.  -Trial of oral B12 or B complex vitamin.  That can come in form of tablet, capsule, or sublingual drops.  Should be taken daily. -Recheck labs in 3 months with follow-up phone visit.

## 2023-01-04 NOTE — Assessment & Plan Note (Addendum)
 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  -She started antiestrogen therapy with Letrozole  since 07/2015, tolerating well without side effects except hair loss. She has considered stopping AI because of this for over a year, but has agreed to continue.  However due to the negative impact on her quality of life she stopped in 01/2022. She completed 7 years on letrozole . Continue with breast cancer surveillance.

## 2023-01-04 NOTE — Progress Notes (Signed)
 I connected with Mary Randall on 01/05/23 at  9:00 AM EST by telephone and verified that I am speaking with the correct person using two identifiers.   I discussed the limitations, risks, security and privacy concerns of performing an evaluation and management service by telemedicine and the availability of in-person appointments. I also discussed with the patient that there may be a patient responsible charge related to this service. The patient expressed understanding and agreed to proceed.   Other persons participating in the visit and their role in the encounter: none   Patient's location: home   Provider's location: [facility]   Chief Complaint: fatigue, dizziness   Patient Care Team: Marvene Prentice SAUNDERS, FNP as PCP - General (Family Medicine) Wonda Sharper, MD as PCP - Cardiology (Cardiology) Curvin Deward MOULD, MD as Consulting Physician (General Surgery) Lanny Callander, MD as Consulting Physician (Hematology and Oncology)  Clinic Day:  01/05/2023  Referring physician: Marvene Prentice SAUNDERS, FNP  ASSESSMENT & PLAN:   Assessment & Plan: B12 deficiency Recent b12 levels show normal Hgb, Hct, ferritin, and vitamin B12 levels.  -Trial of oral B12 or B complex vitamin.  That can come in form of tablet, capsule, or sublingual drops.  Should be taken daily. -Recheck labs in 3 months with follow-up phone visit.  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  -She started antiestrogen therapy with Letrozole  since 07/2015, tolerating well without side effects except hair loss. She has considered stopping AI because of this for over a year, but has agreed to continue.  However due to the negative impact on her quality of life she stopped in 01/2022. She completed 7 years on  letrozole . Continue with breast cancer surveillance. She did complete 7 years of therapy on AI.    Plan: Labs reviewed  -CBC showing WBC 3.5; Hgb 13.2; Hct 40.3; Plt 202; Anc 1.8 -CMP - K 3.6; glucose 95; BUN 15; Creatinine 1.07; eGFR 60; Ca 10.7; LFTs normal.  -Ferritin 75; vitamin B12 474. Recommend trial of OTC vitamin B12 or B complex vitamin daily.  Recommend appropriate fluid and calorie intake every day. Recheck labs in 3 months with follow-up phone visit to review labs.  Reconsider addition of vitamin B12 injections at that time.  The patient understands the plans discussed today and is in agreement with them.  She knows to contact our office if she develops concerns prior to her next appointment.  I provided 10 minutes of face-to-face time during this encounter and > 50% was spent counseling as documented under my assessment and plan.    Powell FORBES Lessen, NP  [facility] - A DEPT OF [facility] [address] [place] [address] Dept: [phone] Dept Fax: [phone]   No orders of the defined types were placed in this encounter.     CHIEF COMPLAINT:  CC: left breast cancer - ER positive, vitamin B12 deficiency   Current Treatment:  cancer surveillance   INTERVAL HISTORY:  Mary Randall is here today for repeat clinical assessment. She last saw Dr. Lanny on 11/15/2022. She completed 7 years therapy on letrozole . Having symptoms.of fatigue and hair loss.  Since then she has continued to feel weak and fatigued.  Did have a 2-day stretch of time where she laid in bed was  unable to do much.  After that she felt much better.  Has had some dizziness and lightheadedness since then.  Has noted that increased fluid intake and increased calorie intake improves his symptoms.  Recent labs indicate normal Hgb, Hct, Ferritin, and B12.  Calcium  slightly elevated at 10.7.  She is interested in other forms of B12 supplementation  other than injections. She denies chest pain, chest pressure, or shortness of breath. She denies headaches or visual disturbances. she denies abdominal pain, nausea, vomiting, or changes in bowel or bladder habits.     I have reviewed the past medical history, past surgical history, social history and family history with the patient and they are unchanged from previous note.  ALLERGIES:  is allergic to carvedilol, codeine, percocet [oxycodone -acetaminophen ], adhesive [tape], and compazine  [prochlorperazine ].  MEDICATIONS:  Current Outpatient Medications  Medication Sig Dispense Refill   amLODipine  (NORVASC ) 5 MG tablet Take 1 tablet (5 mg total) by mouth daily. 90 tablet 3   atorvastatin  (LIPITOR) 40 MG tablet TAKE 1 TABLET BY MOUTH EVERY DAY AT 6 PM 90 tablet 3   hydrochlorothiazide  (HYDRODIURIL ) 25 MG tablet Take 1 tablet (25 mg total) by mouth daily. 90 tablet 3   irbesartan  (AVAPRO ) 150 MG tablet Take 1 tablet (150 mg total) by mouth daily. TAKE 1 TABLET(150 MG) BY MOUTH DAILY 90 tablet 3   isosorbide  mononitrate (IMDUR ) 30 MG 24 hr tablet Take 1 tablet (30 mg total) by mouth daily. 90 tablet 3   nitroGLYCERIN  (NITROSTAT ) 0.4 MG SL tablet Place 1 tablet (0.4 mg total) under the tongue every 5 (five) minutes as needed for chest pain. 25 tablet 3   potassium chloride  (KLOR-CON ) 10 MEQ tablet Take 1 tablet (10 mEq total) by mouth every evening. 90 tablet 3   No current facility-administered medications for this visit.    HISTORY OF PRESENT ILLNESS:   Oncology History Overview Note  Breast cancer of upper-inner quadrant of left female breast (HCC)   Staging form: Breast, AJCC 7th Edition     Clinical: Stage IIIA (T3, N1, M0) - Unsigned     Pathologic stage from 03/15/2015: Stage IIA (yT1c, N1a, cM0) - Signed by Onita Mattock, MD on 04/02/2015     Breast cancer of upper-inner quadrant of left female breast (HCC)  08/06/2014 Initial Diagnosis   Breast cancer of upper-inner quadrant of left  female breast   08/06/2014 Initial Biopsy   Left breast 9:30 o'clock position showed invasive ductal carcinoma, and 1 left axillary lymph node biopsy showed positive for metastatic carcinoma.   08/06/2014 Receptors her2   ER 100% positive, PR 5% positive, HER2-, Ki-67 50%   08/06/2014 Mammogram   Diagnostic mammogram and ultrasound showed left breast mass with associated calcification measuring 5.5 cm, indeterminate a  left axillary lymph node was cortical thickening, circumscribed low density oval 8mm mass at 12:00 within the left breast.   09/03/2014 - 01/26/2015 Neo-Adjuvant Chemotherapy   ddAC every 2 weeks X4, followed by weekly Taxol  X12    09/23/2014 Procedure   Genetic testing: Negative. Genes evaluated: ATM, BARD1, BRCA1, BRCA2, BRIP1, CDH1, CHEK2, EPCAM, FANCC, MLH1, MSH2, MSH6, NBN, PALB2, PMS2, PTEN, RAD51C, RAD51D, TP53, & XRCC2.    03/15/2015 Surgery   left lumpectomy and axillary node dissection Osker)   03/15/2015 Pathology Results   left lumpectomy showed G2 invasive ductal carcinoma, 2cm, G2, and DCIS, G2. the lateral margin was positive for invasive tumor, 1 out of 15 nodes was positive.     ypT1c,ypN1a: Stage  IIA    04/21/2015 Surgery   Re-excision for positive margin. Surgical path was negative for malignancy.   05/17/2015 - 06/28/2015 Radiation Therapy   adjuvant breast radiation Audry). Left breast treated to 50 Gy in 25 fractions.  Left SCLV and PAB treated to 45 Gy in 25 fractions.  Left breast boost treated to 10 Gy in 5 fractions    07/12/2015 -  Anti-estrogen oral therapy   Letrozole  2.5 mg once daily. Planned duration of therapy: 10 years.    08/19/2015 Imaging   DEXA scan: Normal. (T-score -1.0)   08/24/2015 Mammogram   Diagnostic mammogram: No evidence for malignancy.    09/18/2016 Mammogram   IMPRESSION: Stable lumpectomy changes on the left. No evidence of malignancy bilaterally.   09/20/2017 Mammogram   09/20/2017 Mammogram IMPRESSION: Stable left breast  lumpectomy site. No mammographic evidence of malignancy in the bilateral breasts.   09/26/2018 Mammogram   FINDINGS: There are lumpectomy changes in the lower inner left breast. Surgical changes in the inferior left axilla. No suspicious mass, suspicious microcalcification, or architectural distortion is identified to suggest malignancy in either breast. IMPRESSION: No evidence of malignancy in either breast.       REVIEW OF SYSTEMS:   Constitutional: Denies fevers, chills or abnormal weight loss, fatigue  Eyes: Denies blurriness of vision Ears, nose, mouth, throat, and face: Denies mucositis or sore throat Respiratory: Denies cough, dyspnea or wheezes Cardiovascular: Denies palpitation, chest discomfort or lower extremity swelling Gastrointestinal:  Denies nausea, heartburn or change in bowel habits Skin: Denies abnormal skin rashes Lymphatics: Denies new lymphadenopathy or easy bruising Neurological:Denies numbness, tingling or new weaknesses.  Occasional episodes of dizziness and lightheadedness. Behavioral/Psych: Mood is stable, no new changes  All other systems were reviewed with the patient and are negative.   VITALS:  There were no vitals taken for this visit.  Wt Readings from Last 3 Encounters:  11/15/22 172 lb 9.6 oz (78.3 kg)  02/27/22 168 lb (76.2 kg)  11/10/21 169 lb 11.2 oz (77 kg)    There is no height or weight on file to calculate BMI.  Performance status (ECOG): 1 - Symptomatic but completely ambulatory   LABORATORY DATA:  I have reviewed the data as listed    Component Value Date/Time   NA 140 01/02/2023 0747   NA 142 07/19/2016 0810   K 3.6 01/02/2023 0747   K 3.7 07/19/2016 0810   CL 103 01/02/2023 0747   CO2 29 01/02/2023 0747   CO2 26 07/19/2016 0810   GLUCOSE 95 01/02/2023 0747   GLUCOSE 112 07/19/2016 0810   BUN 15 01/02/2023 0747   BUN 13.1 07/19/2016 0810   CREATININE 1.03 (H) 01/02/2023 0747   CREATININE 1.1 07/19/2016 0810    CALCIUM  10.7 (H) 01/02/2023 0747   CALCIUM  10.9 (H) 07/19/2016 0810   PROT 7.4 01/02/2023 0747   PROT 6.4 02/27/2022 0911   PROT 6.8 07/19/2016 0810   ALBUMIN 4.3 01/02/2023 0747   ALBUMIN 4.1 02/27/2022 0911   ALBUMIN 3.8 07/19/2016 0810   AST 15 01/02/2023 0747   AST 14 07/19/2016 0810   ALT 11 01/02/2023 0747   ALT 14 07/19/2016 0810   ALKPHOS 86 01/02/2023 0747   ALKPHOS 109 07/19/2016 0810   BILITOT 0.9 01/02/2023 0747   BILITOT 0.65 07/19/2016 0810   GFRNONAA 60 (L) 01/02/2023 0747   GFRAA >60 05/12/2019 1033     Lab Results  Component Value Date   WBC 3.5 (L) 01/02/2023   NEUTROABS 1.8  01/02/2023   HGB 13.2 01/02/2023   HCT 40.3 01/02/2023   MCV 87.4 01/02/2023   PLT 202 01/02/2023     Iron/TIBC/Ferritin/ %Sat    Component Value Date/Time   FERRITIN 75 01/02/2023 0747    Lab Results  Component Value Date   VITAMINB12 474 01/02/2023

## 2023-01-05 ENCOUNTER — Inpatient Hospital Stay: Payer: Federal, State, Local not specified - PPO | Attending: Hematology | Admitting: Nurse Practitioner

## 2023-01-05 ENCOUNTER — Encounter: Payer: Self-pay | Admitting: Nurse Practitioner

## 2023-01-05 DIAGNOSIS — C50212 Malignant neoplasm of upper-inner quadrant of left female breast: Secondary | ICD-10-CM | POA: Diagnosis not present

## 2023-01-05 DIAGNOSIS — Z170 Estrogen receptor positive status [ER+]: Secondary | ICD-10-CM

## 2023-01-05 DIAGNOSIS — E538 Deficiency of other specified B group vitamins: Secondary | ICD-10-CM | POA: Diagnosis not present

## 2023-01-11 ENCOUNTER — Ambulatory Visit
Admission: RE | Admit: 2023-01-11 | Discharge: 2023-01-11 | Disposition: A | Discharge status: Home / Self Care | Payer: Federal, State, Local not specified - PPO | Source: Ambulatory Visit | Attending: Hematology | Admitting: Hematology

## 2023-01-11 DIAGNOSIS — Z1231 Encounter for screening mammogram for malignant neoplasm of breast: Secondary | ICD-10-CM

## 2023-01-12 ENCOUNTER — Other Ambulatory Visit: Payer: Self-pay | Admitting: Hematology

## 2023-01-12 DIAGNOSIS — R928 Other abnormal and inconclusive findings on diagnostic imaging of breast: Secondary | ICD-10-CM

## 2023-01-29 ENCOUNTER — Other Ambulatory Visit: Payer: Self-pay | Admitting: Hematology

## 2023-01-29 ENCOUNTER — Ambulatory Visit
Admission: RE | Admit: 2023-01-29 | Discharge: 2023-01-29 | Disposition: A | Discharge status: Home / Self Care | Payer: Federal, State, Local not specified - PPO | Source: Ambulatory Visit | Attending: Hematology

## 2023-01-29 DIAGNOSIS — R928 Other abnormal and inconclusive findings on diagnostic imaging of breast: Secondary | ICD-10-CM

## 2023-01-29 DIAGNOSIS — R599 Enlarged lymph nodes, unspecified: Secondary | ICD-10-CM

## 2023-02-15 ENCOUNTER — Telehealth: Payer: Self-pay

## 2023-02-15 NOTE — Telephone Encounter (Signed)
Contacted patient via telephone call.  Let patient know that Mary Glad, NP reviewed the axillary [place] which shows likely benign LN, the plan is to repeat R axillary [place] in 6 months which has been ordered. Let patient know to Please keep upcoming appointment, but let us know if things change or need to come in sooner. Patient verbalized understanding and did not have any further questions or concerns.

## 2023-03-01 NOTE — Progress Notes (Unsigned)
 Cardiology Office Note:    Date:  03/02/2023   ID:  Mary Randall, DOB [birth year], MRN [id number]  PCP:  Soundra Pilon, FNP   [facility] Providers Cardiologist:  Tonny Bollman, MD     Referring MD: Soundra Pilon, FNP   Chief Complaint  Patient presents with   Follow-up    angina    History of Present Illness:    [name] is a 68 y.o. 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  She been doing well from a cardiac perspective.  She reports no change in her medications.  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  She denies chest pain, chest pressure, or shortness of breath.  No heart palpitations.  She has not had to take any nitroglycerin over the past year.  She has no complaints today.  She had labs checked on January 02, 2023 and I reviewed these today.  Hemoglobin is 13.2, platelet count 202,000, potassium 3.6, creatinine 1.03, glucose 95.  Current Medications: Current Meds  Medication Sig   amLODipine (NORVASC) 5 MG tablet Take 1 tablet (5 mg total) by mouth daily.   atorvastatin (LIPITOR) 40 MG tablet TAKE 1 TABLET BY MOUTH EVERY DAY AT 6 PM   hydrochlorothiazide (HYDRODIURIL) 25 MG tablet Take 1 tablet (25 mg total) by mouth daily.   irbesartan (AVAPRO) 150 MG tablet Take 1 tablet (150 mg total) by mouth daily. TAKE 1 TABLET(150 MG) BY MOUTH DAILY   isosorbide mononitrate (IMDUR) 30 MG 24 hr tablet Take 1 tablet (30 mg total) by mouth daily.   nitroGLYCERIN (NITROSTAT) 0.4 MG SL tablet Place 1 tablet (0.4 mg total) under the tongue every 5 (five) minutes as needed for chest pain.   potassium chloride (KLOR-CON) 10 MEQ tablet Take 1 tablet (10 mEq total) by  mouth every evening.     Allergies:   Carvedilol, Codeine, Percocet [oxycodone-acetaminophen], Adhesive [tape], and Compazine [prochlorperazine]   ROS:   Please see the history of present illness.    All other systems reviewed and are negative.  EKGs/Labs/Other Studies Reviewed:    The following studies were reviewed today: Cardiac Studies & Procedures   ______________________________________________________________________________________________     ECHOCARDIOGRAM  ECHOCARDIOGRAM COMPLETE 03/23/2022  Narrative ECHOCARDIOGRAM REPORT    Patient Name:   Mary Randall  Date of Exam: 03/23/2022 Medical Rec #:  [id number]     Height:       66.0 in Accession #:    [id number]    Weight:       168.0 lb Date of Birth:  07-17-1955      BSA:          1.857 m Patient Age:    67 years      BP:           133/93 mmHg Patient Gender: F             HR:           73 bpm. Exam Location:  [address]  Procedure: 2D Echo and Cardiac Doppler  Indications:    I25.10 Nonobstructive of coronary artery  History:  Patient has prior history of Echocardiogram examinations, most recent 08/24/2014. Risk Factors:Hypertension and HLD.  Sonographer:    Clearence Ped RCS Referring Phys: [phone] Ames Coupe, JR DICK  IMPRESSIONS   1. Left ventricular ejection fraction, by estimation, is 55 to 60%. The left ventricle has normal function. The left ventricle has no regional wall motion abnormalities. There is mild left ventricular hypertrophy. Left ventricular diastolic parameters were normal. The average left ventricular global longitudinal strain is -17.0 %. The global longitudinal strain is abnormal. 2. Right ventricular systolic function is normal. The right ventricular size is normal. 3. The mitral valve is normal in structure. No evidence of mitral valve regurgitation. No evidence of mitral stenosis. 4. The aortic valve is tricuspid. Aortic valve regurgitation is not visualized. Aortic valve  sclerosis is present, with no evidence of aortic valve stenosis. 5. The inferior vena cava is normal in size with greater than 50% respiratory variability, suggesting right atrial pressure of 3 mmHg.  Comparison(s): No prior Echocardiogram.  FINDINGS Left Ventricle: Left ventricular ejection fraction, by estimation, is 55 to 60%. The left ventricle has normal function. The left ventricle has no regional wall motion abnormalities. The average left ventricular global longitudinal strain is -17.0 %. The global longitudinal strain is abnormal. The left ventricular internal cavity size was normal in size. There is mild left ventricular hypertrophy. Left ventricular diastolic parameters were normal.  Right Ventricle: The right ventricular size is normal. Right ventricular systolic function is normal.  Left Atrium: Left atrial size was normal in size.  Right Atrium: Right atrial size was normal in size.  Pericardium: Trivial pericardial effusion is present.  Mitral Valve: The mitral valve is normal in structure. No evidence of mitral valve regurgitation. No evidence of mitral valve stenosis.  Tricuspid Valve: The tricuspid valve is normal in structure. Tricuspid valve regurgitation is not demonstrated. No evidence of tricuspid stenosis.  Aortic Valve: The aortic valve is tricuspid. Aortic valve regurgitation is not visualized. Aortic valve sclerosis is present, with no evidence of aortic valve stenosis.  Pulmonic Valve: The pulmonic valve was normal in structure. Pulmonic valve regurgitation is not visualized. No evidence of pulmonic stenosis.  Aorta: The aortic root is normal in size and structure.  Venous: The inferior vena cava is normal in size with greater than 50% respiratory variability, suggesting right atrial pressure of 3 mmHg.  IAS/Shunts: No atrial level shunt detected by color flow Doppler.   LEFT VENTRICLE PLAX 2D LVIDd:         3.70 cm   Diastology LVIDs:         2.70 cm    LV e' medial:    8.49 cm/s LV PW:         1.00 cm   LV E/e' medial:  4.4 LV IVS:        1.35 cm   LV e' lateral:   9.79 cm/s LVOT diam:     2.10 cm   LV E/e' lateral: 3.9 LV SV:         49 LV SV Index:   26        2D Longitudinal Strain LVOT Area:     3.46 cm  2D Strain GLS (A2C):   -16.6 % 2D Strain GLS (A3C):   -17.7 % 2D Strain GLS (A4C):   -16.8 % 2D Strain GLS Avg:     -17.0 %  RIGHT VENTRICLE RV Basal diam:  2.90 cm RV S prime:     13.60 cm/s TAPSE (M-mode):  2.0 cm  LEFT ATRIUM             Index        RIGHT ATRIUM           Index LA diam:        3.80 cm 2.05 cm/m   RA Area:     11.40 cm LA Vol (A2C):   34.9 ml 18.79 ml/m  RA Volume:   27.10 ml  14.59 ml/m LA Vol (A4C):   31.7 ml 17.07 ml/m LA Biplane Vol: 35.7 ml 19.22 ml/m AORTIC VALVE LVOT Vmax:   79.80 cm/s LVOT Vmean:  58.100 cm/s LVOT VTI:    0.141 m  AORTA Ao Root diam: 2.90 cm Ao Asc diam:  3.50 cm  MITRAL VALVE MV Area (PHT):             SHUNTS MV Decel Time:             Systemic VTI:  0.14 m MV E velocity: 37.70 cm/s  Systemic Diam: 2.10 cm MV A velocity: 67.10 cm/s MV E/A ratio:  0.56  Olga Millers MD Electronically signed by Olga Millers MD Signature Date/Time: 03/23/2022/12:02:19 PM    Final          ______________________________________________________________________________________________      EKG:        Recent Labs: 01/02/2023: ALT 11; BUN 15; Creatinine 1.03; Hemoglobin 13.2; Platelet Count 202; Potassium 3.6; Sodium 140  Recent Lipid Panel    Component Value Date/Time   CHOL 156 02/27/2022 0911   TRIG 58 02/27/2022 0911   HDL 50 02/27/2022 0911   CHOLHDL 3.1 02/27/2022 0911   CHOLHDL 3.7 03/17/2014 0402   VLDL 27 03/17/2014 0402   LDLCALC 94 02/27/2022 0911     Risk Assessment/Calculations:      HYPERTENSION CONTROL Vitals:   03/02/23 0806 03/02/23 0828  BP: (!) 122/96 (!) 126/90    The patient's blood pressure is elevated above target today.  In  order to address the patient's elevated BP: Blood pressure will be monitored at home to determine if medication changes need to be made.            Physical Exam:    VS:  BP (!) 126/90   Pulse 74   Ht 5\' 6"  (1.676 m)   Wt 164 lb 3.2 oz (74.5 kg)   SpO2 97%   BMI 26.50 kg/m     Wt Readings from Last 3 Encounters:  03/02/23 164 lb 3.2 oz (74.5 kg)  11/15/22 172 lb 9.6 oz (78.3 kg)  02/27/22 168 lb (76.2 kg)     GEN:  Well nourished, well developed in no acute distress HEENT: Normal NECK: No JVD; No carotid bruits LYMPHATICS: No lymphadenopathy CARDIAC: RRR, no murmurs, rubs, gallops RESPIRATORY:  Clear to auscultation without rales, wheezing or rhonchi  ABDOMEN: Soft, non-tender, non-distended MUSCULOSKELETAL:  No edema; No deformity  SKIN: Warm and dry NEUROLOGIC:  Alert and oriented x 3 PSYCHIATRIC:  Normal affect   Assessment & Plan Prinzmetal variant angina (HCC) Well-controlled on a combination of isosorbide and amlodipine.  No changes made today.  She has not required any sublingual nitroglycerin over the past year. Essential hypertension, benign I rechecked the patient's blood pressure at 126/90 mmHg.  She does not monitor her blood pressure at home.  She is on a combination of amlodipine, hydrochlorothiazide, and irbesartan.  I asked her to obtain a home blood pressure cuff.  We discussed proper blood pressure measurement technique.  We discussed the importance of a low-sodium diet.  I gave her parameters to call in if she is seeing readings greater than 135/85 mmHg. Mixed hyperlipidemia Treated with atorvastatin 40 mg daily.  Last lipids with an LDL of 94.  No obstructive CAD.  No diabetes.      Medication Adjustments/Labs and Tests Ordered: Current medicines are reviewed at length with the patient today.  Concerns regarding medicines are outlined above.  No orders of the defined types were placed in this encounter.  No orders of the defined types were placed  in this encounter.   Patient Instructions  Medication Instructions:  Your physician recommends that you continue on your current medications as directed. Please refer to the Current Medication list given to you today.  *If you need a refill on your cardiac medications before your next appointment, please call your pharmacy*  Follow-Up: At [facility], you and your health needs are our priority.  As part of our continuing mission to provide you with exceptional heart care, we have created designated Provider Care Teams.  These Care Teams include your primary Cardiologist (physician) and Advanced Practice Providers (APPs -  Physician Assistants and Nurse Practitioners) who all work together to provide you with the care you need, when you need it.  Your next appointment:   1 year  Provider:   Tonny Bollman, MD        Signed, Tonny Bollman, MD  03/02/2023 9:43 AM    [facility]

## 2023-03-02 ENCOUNTER — Encounter: Payer: Self-pay | Admitting: Cardiovascular Disease

## 2023-03-02 ENCOUNTER — Ambulatory Visit: Payer: Federal, State, Local not specified - PPO | Attending: Cardiovascular Disease | Admitting: Cardiovascular Disease

## 2023-03-02 VITALS — BP 126/90 | HR 74 | Ht 66.0 in | Wt 164.2 lb

## 2023-03-02 DIAGNOSIS — I10 Essential (primary) hypertension: Secondary | ICD-10-CM

## 2023-03-02 DIAGNOSIS — E782 Mixed hyperlipidemia: Secondary | ICD-10-CM

## 2023-03-02 DIAGNOSIS — I201 Angina pectoris with documented spasm: Secondary | ICD-10-CM

## 2023-03-02 NOTE — Patient Instructions (Signed)
 Medication Instructions:  Your physician recommends that you continue on your current medications as directed. Please refer to the Current Medication list given to you today.  *If you need a refill on your cardiac medications before your next appointment, please call your pharmacy*  Follow-Up: At [facility], you and your health needs are our priority.  As part of our continuing mission to provide you with exceptional heart care, we have created designated Provider Care Teams.  These Care Teams include your primary Cardiologist (physician) and Advanced Practice Providers (APPs -  Physician Assistants and Nurse Practitioners) who all work together to provide you with the care you need, when you need it.  Your next appointment:   1 year  Provider:   Tonny Bollman, MD

## 2023-03-02 NOTE — Assessment & Plan Note (Signed)
 I rechecked the patient's blood pressure at 126/90 mmHg.  She does not monitor her blood pressure at home.  She is on a combination of amlodipine, hydrochlorothiazide, and irbesartan.  I asked her to obtain a home blood pressure cuff.  We discussed proper blood pressure measurement technique.  We discussed the importance of a low-sodium diet.  I gave her parameters to call in if she is seeing readings greater than 135/85 mmHg.

## 2023-03-02 NOTE — Assessment & Plan Note (Signed)
 Well-controlled on a combination of isosorbide and amlodipine.  No changes made today.  She has not required any sublingual nitroglycerin over the past year.

## 2023-03-07 ENCOUNTER — Other Ambulatory Visit: Payer: Self-pay | Admitting: Nurse Practitioner

## 2023-04-05 ENCOUNTER — Telehealth: Payer: Self-pay | Admitting: Hematology

## 2023-04-06 ENCOUNTER — Inpatient Hospital Stay: Payer: Federal, State, Local not specified - PPO

## 2023-04-13 ENCOUNTER — Ambulatory Visit: Payer: Federal, State, Local not specified - PPO | Admitting: Nurse Practitioner

## 2023-06-13 ENCOUNTER — Other Ambulatory Visit: Payer: Self-pay | Admitting: Nurse Practitioner

## 2023-07-10 ENCOUNTER — Other Ambulatory Visit: Payer: Self-pay | Admitting: Nurse Practitioner

## 2023-07-12 ENCOUNTER — Telehealth: Payer: Self-pay | Admitting: Cardiovascular Disease

## 2023-07-12 MED ORDER — IRBESARTAN 150 MG PO TABS: 150.0000 mg | ORAL_TABLET | Freq: Every day | ORAL | 2 refills | Status: DC

## 2023-07-12 NOTE — Telephone Encounter (Signed)
 Requested Prescriptions   Signed Prescriptions Disp Refills   irbesartan  (AVAPRO ) 150 MG tablet 90 tablet 2    Sig: Take 1 tablet (150 mg total) by mouth daily. TAKE 1 TABLET(150 MG) BY MOUTH DAILY    Authorizing Provider: WYN JACKEE DEL, JR    Ordering User: Raliyah Montella  C

## 2023-07-12 NOTE — Telephone Encounter (Signed)
*  STAT* If patient is at the pharmacy, call can be transferred to refill team.   1. Which medications need to be refilled? (please list name of each medication and dose if known) Irbesartan    2. Would you like to learn more about the convenience, safety, & potential cost savings by using the [facility]?     3. Are you open to using the [facility] ([facility].    4. Which pharmacy/location (including street and city if local pharmacy) is medication to be sent to? [facility] [place], [place]   5. Do they need a 30 day or 90 day supply? 90 days and refills- please call today- out of medicine

## 2023-07-31 ENCOUNTER — Other Ambulatory Visit

## 2023-08-06 ENCOUNTER — Other Ambulatory Visit: Payer: Self-pay | Admitting: Hematology

## 2023-08-06 ENCOUNTER — Ambulatory Visit
Admission: RE | Admit: 2023-08-06 | Discharge: 2023-08-06 | Disposition: A | Discharge status: Home / Self Care | Source: Ambulatory Visit | Attending: Hematology | Admitting: Hematology

## 2023-08-06 DIAGNOSIS — R599 Enlarged lymph nodes, unspecified: Secondary | ICD-10-CM

## 2023-10-10 ENCOUNTER — Other Ambulatory Visit: Payer: Self-pay | Admitting: Cardiovascular Disease

## 2023-10-14 ENCOUNTER — Other Ambulatory Visit: Payer: Self-pay | Admitting: Cardiovascular Disease

## 2023-10-15 ENCOUNTER — Telehealth: Payer: Self-pay | Admitting: Cardiovascular Disease

## 2023-10-15 NOTE — Telephone Encounter (Signed)
 RX sent in in 03/2023 for a year supply. Called Pharmacy and there was a mixup. It was corrected. Called Pt to inform and Pt verbalized understanding.

## 2023-10-15 NOTE — Telephone Encounter (Signed)
*  STAT* If patient is at the pharmacy, call can be transferred to refill team.   1. Which medications need to be refilled? (please list name of each medication and dose if known) isosorbide  mononitrate (IMDUR ) 30 MG 24 hr tablet [[id number]]    2. Would you like to learn more about the convenience, safety, & potential cost savings by using the [facility]?  No     3. Are you open to using the [facility] ([facility].   4. Which pharmacy/location (including street and city if local pharmacy) is medication to be sent to?  [facility] on [address]    5. Do they need a 30 day or 90 day supply? 90

## 2023-11-20 ENCOUNTER — Other Ambulatory Visit: Payer: Federal, State, Local not specified - PPO

## 2023-11-20 ENCOUNTER — Ambulatory Visit: Payer: Federal, State, Local not specified - PPO | Admitting: Hematology

## 2024-01-16 ENCOUNTER — Other Ambulatory Visit: Payer: Self-pay

## 2024-01-16 ENCOUNTER — Other Ambulatory Visit

## 2024-01-16 ENCOUNTER — Encounter

## 2024-01-16 DIAGNOSIS — C50212 Malignant neoplasm of upper-inner quadrant of left female breast: Secondary | ICD-10-CM

## 2024-01-16 DIAGNOSIS — E538 Deficiency of other specified B group vitamins: Secondary | ICD-10-CM

## 2024-01-16 NOTE — Assessment & Plan Note (Signed)
 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  -She started antiestrogen therapy with Letrozole since 07/2015, tolerating well without side effects except hair loss. She has considered stopping AI because of this for over a year, but has agreed to continue.  However due to the negative impact on her quality of life she stopped in 01/2022.

## 2024-01-17 ENCOUNTER — Inpatient Hospital Stay: Admitting: Hematology

## 2024-01-17 ENCOUNTER — Inpatient Hospital Stay

## 2024-01-28 ENCOUNTER — Inpatient Hospital Stay

## 2024-01-28 ENCOUNTER — Inpatient Hospital Stay: Admitting: Hematology

## 2024-02-02 ENCOUNTER — Other Ambulatory Visit: Payer: Self-pay | Admitting: Nurse Practitioner

## 2024-02-11 ENCOUNTER — Other Ambulatory Visit

## 2024-02-11 ENCOUNTER — Encounter

## 2024-02-18 ENCOUNTER — Inpatient Hospital Stay: Admitting: Hematology

## 2024-02-18 ENCOUNTER — Inpatient Hospital Stay
